# Patient Record
Sex: Male | Born: 1980
Health system: Southern US, Community
[De-identification: ages and names within clinical notes are randomized; demographics above are authoritative.]

## PROBLEM LIST (undated history)

## (undated) DIAGNOSIS — R7303 Prediabetes: Secondary | ICD-10-CM

## (undated) DIAGNOSIS — N200 Calculus of kidney: Secondary | ICD-10-CM

## (undated) DIAGNOSIS — F329 Major depressive disorder, single episode, unspecified: Secondary | ICD-10-CM

## (undated) DIAGNOSIS — E669 Obesity, unspecified: Secondary | ICD-10-CM

## (undated) DIAGNOSIS — G4700 Insomnia, unspecified: Secondary | ICD-10-CM

## (undated) DIAGNOSIS — F32A Depression, unspecified: Secondary | ICD-10-CM

## (undated) DIAGNOSIS — R42 Dizziness and giddiness: Secondary | ICD-10-CM

## (undated) DIAGNOSIS — E559 Vitamin D deficiency, unspecified: Secondary | ICD-10-CM

## (undated) DIAGNOSIS — F909 Attention-deficit hyperactivity disorder, unspecified type: Secondary | ICD-10-CM

## (undated) DIAGNOSIS — C189 Malignant neoplasm of colon, unspecified: Secondary | ICD-10-CM

## (undated) DIAGNOSIS — F419 Anxiety disorder, unspecified: Secondary | ICD-10-CM

## (undated) DIAGNOSIS — H7093 Unspecified mastoiditis, bilateral: Secondary | ICD-10-CM

## (undated) DIAGNOSIS — Z87442 Personal history of urinary calculi: Secondary | ICD-10-CM

## (undated) DIAGNOSIS — C801 Malignant (primary) neoplasm, unspecified: Secondary | ICD-10-CM

## (undated) DIAGNOSIS — U071 COVID-19: Secondary | ICD-10-CM

## (undated) HISTORY — DX: Obesity, unspecified: E66.9

## (undated) HISTORY — PX: NO PAST SURGERIES: SHX2092

## (undated) HISTORY — DX: COVID-19: U07.1

## (undated) HISTORY — DX: Prediabetes: R73.03

## (undated) HISTORY — DX: Insomnia, unspecified: G47.00

## (undated) HISTORY — DX: Vitamin D deficiency, unspecified: E55.9

## (undated) HISTORY — DX: Attention-deficit hyperactivity disorder, unspecified type: F90.9

## (undated) HISTORY — DX: Dizziness and giddiness: R42

## (undated) HISTORY — DX: Depression, unspecified: F32.A

## (undated) HISTORY — DX: Anxiety disorder, unspecified: F41.9

## (undated) HISTORY — DX: Unspecified mastoiditis, bilateral: H70.93

---
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
## 2007-11-06 ENCOUNTER — Emergency Department: Payer: Self-pay | Admitting: Emergency Medicine

## 2007-12-04 ENCOUNTER — Ambulatory Visit: Payer: Self-pay | Admitting: Family Medicine

## 2010-06-15 ENCOUNTER — Emergency Department: Payer: Self-pay | Admitting: Emergency Medicine

## 2010-08-02 ENCOUNTER — Emergency Department: Payer: Self-pay | Admitting: Emergency Medicine

## 2010-08-04 ENCOUNTER — Emergency Department: Payer: Self-pay | Admitting: Emergency Medicine

## 2018-07-22 DIAGNOSIS — F330 Major depressive disorder, recurrent, mild: Secondary | ICD-10-CM | POA: Diagnosis not present

## 2018-11-14 DIAGNOSIS — F332 Major depressive disorder, recurrent severe without psychotic features: Secondary | ICD-10-CM | POA: Diagnosis not present

## 2018-11-25 DIAGNOSIS — F332 Major depressive disorder, recurrent severe without psychotic features: Secondary | ICD-10-CM | POA: Diagnosis not present

## 2019-06-02 ENCOUNTER — Ambulatory Visit (INDEPENDENT_AMBULATORY_CARE_PROVIDER_SITE_OTHER): Payer: BC Managed Care – PPO | Admitting: Internal Medicine

## 2019-06-02 ENCOUNTER — Encounter: Payer: Self-pay | Admitting: Internal Medicine

## 2019-06-02 ENCOUNTER — Other Ambulatory Visit: Payer: Self-pay

## 2019-06-02 DIAGNOSIS — R7303 Prediabetes: Secondary | ICD-10-CM

## 2019-06-02 DIAGNOSIS — Z13220 Encounter for screening for lipoid disorders: Secondary | ICD-10-CM

## 2019-06-02 DIAGNOSIS — G4700 Insomnia, unspecified: Secondary | ICD-10-CM | POA: Insufficient documentation

## 2019-06-02 DIAGNOSIS — F329 Major depressive disorder, single episode, unspecified: Secondary | ICD-10-CM

## 2019-06-02 DIAGNOSIS — Z0000 Encounter for general adult medical examination without abnormal findings: Secondary | ICD-10-CM

## 2019-06-02 DIAGNOSIS — Z1329 Encounter for screening for other suspected endocrine disorder: Secondary | ICD-10-CM

## 2019-06-02 DIAGNOSIS — Z1389 Encounter for screening for other disorder: Secondary | ICD-10-CM

## 2019-06-02 DIAGNOSIS — E669 Obesity, unspecified: Secondary | ICD-10-CM | POA: Insufficient documentation

## 2019-06-02 DIAGNOSIS — F419 Anxiety disorder, unspecified: Secondary | ICD-10-CM

## 2019-06-02 DIAGNOSIS — E559 Vitamin D deficiency, unspecified: Secondary | ICD-10-CM

## 2019-06-02 DIAGNOSIS — F331 Major depressive disorder, recurrent, moderate: Secondary | ICD-10-CM | POA: Insufficient documentation

## 2019-06-02 DIAGNOSIS — F3341 Major depressive disorder, recurrent, in partial remission: Secondary | ICD-10-CM | POA: Insufficient documentation

## 2019-06-02 DIAGNOSIS — F32A Depression, unspecified: Secondary | ICD-10-CM

## 2019-06-02 MED ORDER — TRAZODONE HCL 50 MG PO TABS: 25.0000 mg | ORAL_TABLET | Freq: Every evening | ORAL | 2 refills | Status: DC | PRN

## 2019-06-02 NOTE — Patient Instructions (Signed)
Trazodone tablets What is this medicine? TRAZODONE (TRAZ oh done) is used to treat depression. This medicine may be used for other purposes; ask your health care provider or pharmacist if you have questions. COMMON BRAND NAME(S): Desyrel What should I tell my health care provider before I take this medicine? They need to know if you have any of these conditions: -attempted suicide or thinking about it -bipolar disorder -bleeding problems -glaucoma -heart disease, or previous heart attack -irregular heart beat -kidney or liver disease -low levels of sodium in the blood -an unusual or allergic reaction to trazodone, other medicines, foods, dyes or preservatives -pregnant or trying to get pregnant -breast-feeding How should I use this medicine? Take this medicine by mouth with a glass of water. Follow the directions on the prescription label. Take this medicine shortly after a meal or a light snack. Take your medicine at regular intervals. Do not take your medicine more often than directed. Do not stop taking this medicine suddenly except upon the advice of your doctor. Stopping this medicine too quickly may cause serious side effects or your condition may worsen. A special MedGuide will be given to you by the pharmacist with each prescription and refill. Be sure to read this information carefully each time. Talk to your pediatrician regarding the use of this medicine in children. Special care may be needed. Overdosage: If you think you have taken too much of this medicine contact a poison control center or emergency room at once. NOTE: This medicine is only for you. Do not share this medicine with others. What if I miss a dose? If you miss a dose, take it as soon as you can. If it is almost time for your next dose, take only that dose. Do not take double or extra doses. What may interact with this medicine? Do not take this medicine with any of the following medications: -certain medicines  for fungal infections like fluconazole, itraconazole, ketoconazole, posaconazole, voriconazole -cisapride -dofetilide -dronedarone -linezolid -MAOIs like Carbex, Eldepryl, Marplan, Nardil, and Parnate -mesoridazine -methylene blue (injected into a vein) -pimozide -saquinavir -thioridazine This medicine may also interact with the following medications: -alcohol -antiviral medicines for HIV or AIDS -aspirin and aspirin-like medicines -barbiturates like phenobarbital -certain medicines for blood pressure, heart disease, irregular heart beat -certain medicines for depression, anxiety, or psychotic disturbances -certain medicines for migraine headache like almotriptan, eletriptan, frovatriptan, naratriptan, rizatriptan, sumatriptan, zolmitriptan -certain medicines for seizures like carbamazepine and phenytoin -certain medicines for sleep -certain medicines that treat or prevent blood clots like dalteparin, enoxaparin, warfarin -digoxin -fentanyl -lithium -NSAIDS, medicines for pain and inflammation, like ibuprofen or naproxen -other medicines that prolong the QT interval (cause an abnormal heart rhythm) -rasagiline -supplements like St. John's wort, kava kava, valerian -tramadol -tryptophan This list may not describe all possible interactions. Give your health care provider a list of all the medicines, herbs, non-prescription drugs, or dietary supplements you use. Also tell them if you smoke, drink alcohol, or use illegal drugs. Some items may interact with your medicine. What should I watch for while using this medicine? Tell your doctor if your symptoms do not get better or if they get worse. Visit your doctor or health care professional for regular checks on your progress. Because it may take several weeks to see the full effects of this medicine, it is important to continue your treatment as prescribed by your doctor. Patients and their families should watch out for new or worsening  thoughts of suicide or depression. Also watch  out for sudden changes in feelings such as feeling anxious, agitated, panicky, irritable, hostile, aggressive, impulsive, severely restless, overly excited and hyperactive, or not being able to sleep. If this happens, especially at the beginning of treatment or after a change in dose, call your health care professional. Cory QuinYou may get drowsy or dizzy. Do not drive, use machinery, or do anything that needs mental alertness until you know how this medicine affects you. Do not stand or sit up quickly, especially if you are an older patient. This reduces the risk of dizzy or fainting spells. Alcohol may interfere with the effect of this medicine. Avoid alcoholic drinks. This medicine may cause dry eyes and blurred vision. If you wear contact lenses you may feel some discomfort. Lubricating drops may help. See your eye doctor if the problem does not go away or is severe. Your mouth may get dry. Chewing sugarless gum, sucking hard candy and drinking plenty of water may help. Contact your doctor if the problem does not go away or is severe. What side effects may I notice from receiving this medicine? Side effects that you should report to your doctor or health care professional as soon as possible: -allergic reactions like skin rash, itching or hives, swelling of the face, lips, or tongue -elevated mood, decreased need for sleep, racing thoughts, impulsive behavior -confusion -fast, irregular heartbeat -feeling faint or lightheaded, falls -feeling agitated, angry, or irritable -loss of balance or coordination -painful or prolonged erections -restlessness, pacing, inability to keep still -suicidal thoughts or other mood changes -tremors -trouble sleeping -seizures -unusual bleeding or bruising Side effects that usually do not require medical attention (report to your doctor or health care professional if they continue or are bothersome): -change in sex drive or  performance -change in appetite or weight -constipation -headache -muscle aches or pains -nausea This list may not describe all possible side effects. Call your doctor for medical advice about side effects. You may report side effects to FDA at 1-800-FDA-1088. Where should I keep my medicine? Keep out of the reach of children. Store at room temperature between 15 and 30 degrees C (59 to 86 degrees F). Protect from light. Keep container tightly closed. Throw away any unused medicine after the expiration date. NOTE: This sheet is a summary. It may not cover all possible information. If you have questions about this medicine, talk to your doctor, pharmacist, or health care provider.  2019 Elsevier/Gold Standard (2018-02-18 17:51:24)  Insomnia Insomnia is a sleep disorder that makes it difficult to fall asleep or stay asleep. Insomnia can cause fatigue, low energy, difficulty concentrating, mood swings, and poor performance at work or school. There are three different ways to classify insomnia:  Difficulty falling asleep.  Difficulty staying asleep.  Waking up too early in the morning. Any type of insomnia can be long-term (chronic) or short-term (acute). Both are common. Short-term insomnia usually lasts for three months or less. Chronic insomnia occurs at least three times a week for longer than three months. What are the causes? Insomnia may be caused by another condition, situation, or substance, such as:  Anxiety.  Certain medicines.  Gastroesophageal reflux disease (GERD) or other gastrointestinal conditions.  Asthma or other breathing conditions.  Restless legs syndrome, sleep apnea, or other sleep disorders.  Chronic pain.  Menopause.  Stroke.  Abuse of alcohol, tobacco, or illegal drugs.  Mental health conditions, such as depression.  Caffeine.  Neurological disorders, such as Alzheimer's disease.  An overactive thyroid (hyperthyroidism). Sometimes, the cause  of insomnia may not be known. What increases the risk? Risk factors for insomnia include:  Gender. Women are affected more often than men.  Age. Insomnia is more common as you get older.  Stress.  Lack of exercise.  Irregular work schedule or working night shifts.  Traveling between different time zones.  Certain medical and mental health conditions. What are the signs or symptoms? If you have insomnia, the main symptom is having trouble falling asleep or having trouble staying asleep. This may lead to other symptoms, such as:  Feeling fatigued or having low energy.  Feeling nervous about going to sleep.  Not feeling rested in the morning.  Having trouble concentrating.  Feeling irritable, anxious, or depressed. How is this diagnosed? This condition may be diagnosed based on:  Your symptoms and medical history. Your health care provider may ask about: ? Your sleep habits. ? Any medical conditions you have. ? Your mental health.  A physical exam. How is this treated? Treatment for insomnia depends on the cause. Treatment may focus on treating an underlying condition that is causing insomnia. Treatment may also include:  Medicines to help you sleep.  Counseling or therapy.  Lifestyle adjustments to help you sleep better. Follow these instructions at home: Eating and drinking   Limit or avoid alcohol, caffeinated beverages, and cigarettes, especially close to bedtime. These can disrupt your sleep.  Do not eat a large meal or eat spicy foods right before bedtime. This can lead to digestive discomfort that can make it hard for you to sleep. Sleep habits   Keep a sleep diary to help you and your health care provider figure out what could be causing your insomnia. Write down: ? When you sleep. ? When you wake up during the night. ? How well you sleep. ? How rested you feel the next day. ? Any side effects of medicines you are taking. ? What you eat and  drink.  Make your bedroom a dark, comfortable place where it is easy to fall asleep. ? Put up shades or blackout curtains to block light from outside. ? Use a white noise machine to block noise. ? Keep the temperature cool.  Limit screen use before bedtime. This includes: ? Watching TV. ? Using your smartphone, tablet, or computer.  Stick to a routine that includes going to bed and waking up at the same times every day and night. This can help you fall asleep faster. Consider making a quiet activity, such as reading, part of your nighttime routine.  Try to avoid taking naps during the day so that you sleep better at night.  Get out of bed if you are still awake after 15 minutes of trying to sleep. Keep the lights down, but try reading or doing a quiet activity. When you feel sleepy, go back to bed. General instructions  Take over-the-counter and prescription medicines only as told by your health care provider.  Exercise regularly, as told by your health care provider. Avoid exercise starting several hours before bedtime.  Use relaxation techniques to manage stress. Ask your health care provider to suggest some techniques that may work well for you. These may include: ? Breathing exercises. ? Routines to release muscle tension. ? Visualizing peaceful scenes.  Make sure that you drive carefully. Avoid driving if you feel very sleepy.  Keep all follow-up visits as told by your health care provider. This is important. Contact a health care provider if:  You are tired throughout the day.  You have trouble in your daily routine due to sleepiness.  You continue to have sleep problems, or your sleep problems get worse. Get help right away if:  You have serious thoughts about hurting yourself or someone else. If you ever feel like you may hurt yourself or others, or have thoughts about taking your own life, get help right away. You can go to your nearest emergency department or  call:  Your local emergency services (911 in the [place]  A suicide crisis helpline, such as the [facility] at 657-137-0810. This is open 24 hours a day. Summary  Insomnia is a sleep disorder that makes it difficult to fall asleep or stay asleep.  Insomnia can be long-term (chronic) or short-term (acute).  Treatment for insomnia depends on the cause. Treatment may focus on treating an underlying condition that is causing insomnia.  Keep a sleep diary to help you and your health care provider figure out what could be causing your insomnia. This information is not intended to replace advice given to you by your health care provider. Make sure you discuss any questions you have with your health care provider. Document Released: 12/07/2000 Document Revised: 09/19/2017 Document Reviewed: 09/19/2017 Elsevier Interactive Patient Education  2019 Else[employer].  Persistent Depressive Disorder, Adult Persistent depressive disorder (PDD) is a mental health condition that causes symptoms of low-level depression for 2 years or longer. It may also be called long-term (chronic) depression or dysthymia. PDD may include episodes of more severe depression that last for about 2 weeks (major depressive disorder or MDD). PDD can affect the way you think, feel, and sleep. This condition may also affect your relationships. You may be more likely to get sick if you have PDD. What are the causes? The exact cause of this condition is not known. PDD is most likely caused by a combination of things, which may include:  Genetic factors. These are traits that are passed along from parent to child.  Individual factors. Your personality, your behavior, and the way you handle your thoughts and feelings may contribute to PDD. This includes personality traits and behaviors learned from others.  Physical factors, such as: ? Differences in the part of your brain that controls emotion. This part of  your brain may be different than it is in people who do not have PDD. ? Long-term (chronic) medical or psychiatric illnesses.  Social factors. Traumatic experiences or major life changes may play a role in the development of PDD. What increases the risk? This condition is more likely to develop in women. The following factors may make you more likely to develop PDD:  A family history of depression.  Abnormally low levels of certain brain chemicals.  Traumatic events in childhood, especially abuse or the loss of a parent.  Being under a lot of stress, or long-term stress, especially from upsetting life experiences or losses.  A history of: ? Chronic physical illness. ? Other mental health disorders. ? Substance abuse.  Poor living conditions.  Experiencing social exclusion or discrimination on a regular basis. What are the signs or symptoms? Symptoms of this condition occur for most of the day, and may include:  Fatigue or low energy.  Eating too much or too little.  Sleeping too much or too little.  Restlessness or agitation.  Feelings of hopelessness.  Feeling worthless or guilty.  Anxiety.  Poor concentration or difficulty making decisions.  Low self-esteem.  Negative outlook.  Inability to have fun or experience pleasure.  Social withdrawal.  Unexplained physical complaints.  Irritability.  Aggressive behavior or anger. How is this diagnosed? This condition may be diagnosed based on:  Your symptoms.  Your medical history, including your mental health history. This may involve tests to evaluate your mental health. You may be asked questions about your lifestyle, including any drug and alcohol use, and how long you have had symptoms of PDD.  A physical exam.  Blood tests to rule out other conditions. You may be diagnosed with PDD if you have had a depressed mood for 2 years or longer, as well as other symptoms of depression. How is this  treated? This condition is usually treated by mental health professionals, such as psychologists, psychiatrists, and clinical social workers. You may need more than one type of treatment. Treatment may include:  Psychotherapy. This is also called talk therapy or counseling. Types of psychotherapy include: ? Cognitive behavioral therapy (CBT). This type of therapy teaches you to recognize unhealthy feelings, thoughts, and behaviors, and replace them with positive thoughts and actions. ? Interpersonal therapy (IPT). This helps you to improve the way you relate to and communicate with others. ? Family therapy. This treatment includes members of your family.  Medicine to treat anxiety and depression, or to help you control certain emotions and behaviors.  Lifestyle changes, such as: ? Limiting alcohol and drug use. ? Exercising regularly. ? Getting plenty of sleep. ? Making healthy eating choices. ? Spending more time outdoors.  Follow these instructions at home: Activity  Return to your normal activities as told by your health care provider.  Exercise regularly and spend time outdoors as told by your health care provider. General instructions  Take over-the-counter and prescription medicines only as told by your health care provider.  Do not drink alcohol. If you drink alcohol, limit your alcohol intake to no more than 1 drink a day for nonpregnant women and 2 drinks a day for men. One drink equals 12 oz of beer, 5 oz of wine, or 1 oz of hard liquor. Alcohol can affect any antidepressant medicines you are taking. Talk to your health care provider about your alcohol use.  Eat a healthy diet and get plenty of sleep.  Find activities that you enjoy doing, and make time to do them.  Consider joining a support group. Your health care provider may be able to recommend a support group.  Keep all follow-up visits as told by your health care provider. This is important. Where to find more  information [employer] on Mental Illness  www.nami.org [place] [employer] of Mental Health  http://www.maynard.net/ National Suicide Prevention Lifeline  1-800-273-TALK (949)091-3459). This is free, 24-hour help. Contact a health care provider if:  Your symptoms get worse.  You develop new symptoms.  You have trouble sleeping or doing your daily activities. Get help right away if:  You self-harm.  You have serious thoughts about hurting yourself or others.  You see, hear, taste, smell, or feel things that are not present (hallucinate). This information is not intended to replace advice given to you by your health care provider. Make sure you discuss any questions you have with your health care provider. Document Released: 11/26/2012 Document Revised: 08/09/2016 Document Reviewed: 06/23/2016 Elsevier Interactive Patient Education  2019 Elsevier Inc.  Generalized Anxiety Disorder, Adult Generalized anxiety disorder (GAD) is a mental health disorder. People with this condition constantly worry about everyday events. Unlike normal anxiety, worry related to GAD is not triggered by a specific event. These  worries also do not fade or get better with time. GAD interferes with life functions, including relationships, work, and school. GAD can vary from mild to severe. People with severe GAD can have intense waves of anxiety with physical symptoms (panic attacks). What are the causes? The exact cause of GAD is not known. What increases the risk? This condition is more likely to develop in:  Women.  People who have a family history of anxiety disorders.  People who are very shy.  People who experience very stressful life events, such as the death of a loved one.  People who have a very stressful family environment. What are the signs or symptoms? People with GAD often worry excessively about many things in their lives, such as their health and family. They may also be overly  concerned about:  Doing well at work.  Being on time.  Natural disasters.  Friendships. Physical symptoms of GAD include:  Fatigue.  Muscle tension or having muscle twitches.  Trembling or feeling shaky.  Being easily startled.  Feeling like your heart is pounding or racing.  Feeling out of breath or like you cannot take a deep breath.  Having trouble falling asleep or staying asleep.  Sweating.  Nausea, diarrhea, or irritable bowel syndrome (IBS).  Headaches.  Trouble concentrating or remembering facts.  Restlessness.  Irritability. How is this diagnosed? Your health care provider can diagnose GAD based on your symptoms and medical history. You will also have a physical exam. The health care provider will ask specific questions about your symptoms, including how severe they are, when they started, and if they come and go. Your health care provider may ask you about your use of alcohol or drugs, including prescription medicines. Your health care provider may refer you to a mental health specialist for further evaluation. Your health care provider will do a thorough examination and may perform additional tests to rule out other possible causes of your symptoms. To be diagnosed with GAD, a person must have anxiety that:  Is out of his or her control.  Affects several different aspects of his or her life, such as work and relationships.  Causes distress that makes him or her unable to take part in normal activities.  Includes at least three physical symptoms of GAD, such as restlessness, fatigue, trouble concentrating, irritability, muscle tension, or sleep problems. Before your health care provider can confirm a diagnosis of GAD, these symptoms must be present more days than they are not, and they must last for six months or longer. How is this treated? The following therapies are usually used to treat GAD:  Medicine. Antidepressant medicine is usually prescribed for  long-term daily control. Antianxiety medicines may be added in severe cases, especially when panic attacks occur.  Talk therapy (psychotherapy). Certain types of talk therapy can be helpful in treating GAD by providing support, education, and guidance. Options include: ? Cognitive behavioral therapy (CBT). People learn coping skills and techniques to ease their anxiety. They learn to identify unrealistic or negative thoughts and behaviors and to replace them with positive ones. ? Acceptance and commitment therapy (ACT). This treatment teaches people how to be mindful as a way to cope with unwanted thoughts and feelings. ? Biofeedback. This process trains you to manage your body's response (physiological response) through breathing techniques and relaxation methods. You will work with a therapist while machines are used to monitor your physical symptoms.  Stress management techniques. These include yoga, meditation, and exercise. A mental health  specialist can help determine which treatment is best for you. Some people see improvement with one type of therapy. However, other people require a combination of therapies. Follow these instructions at home:  Take over-the-counter and prescription medicines only as told by your health care provider.  Try to maintain a normal routine.  Try to anticipate stressful situations and allow extra time to manage them.  Practice any stress management or self-calming techniques as taught by your health care provider.  Do not punish yourself for setbacks or for not making progress.  Try to recognize your accomplishments, even if they are small.  Keep all follow-up visits as told by your health care provider. This is important. Contact a health care provider if:  Your symptoms do not get better.  Your symptoms get worse.  You have signs of depression, such as: ? A persistently sad, cranky, or irritable mood. ? Loss of enjoyment in activities that used to  bring you joy. ? Change in weight or eating. ? Changes in sleeping habits. ? Avoiding friends or family members. ? Loss of energy for normal tasks. ? Feelings of guilt or worthlessness. Get help right away if:  You have serious thoughts about hurting yourself or others. If you ever feel like you may hurt yourself or others, or have thoughts about taking your own life, get help right away. You can go to your nearest emergency department or call:  Your local emergency services (911 in the [place]  A suicide crisis helpline, such as the National Suicide Prevention Lifeline at 77571776091-(819) 313-4949. This is open 24 hours a day. Summary  Generalized anxiety disorder (GAD) is a mental health disorder that involves worry that is not triggered by a specific event.  People with GAD often worry excessively about many things in their lives, such as their health and family.  GAD may cause physical symptoms such as restlessness, trouble concentrating, sleep problems, frequent sweating, nausea, diarrhea, headaches, and trembling or muscle twitching.  A mental health specialist can help determine which treatment is best for you. Some people see improvement with one type of therapy. However, other people require a combination of therapies. This information is not intended to replace advice given to you by your health care provider. Make sure you discuss any questions you have with your health care provider. Document Released: 04/06/2013 Document Revised: 10/30/2016 Document Reviewed: 10/30/2016 Elsevier Interactive Patient Education  2019 [employer]
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
## 2019-06-02 NOTE — Progress Notes (Signed)
Pre visit review using our clinic review tool, if applicable. No additional management support is needed unless otherwise documented below in the visit note. 

## 2019-06-11 ENCOUNTER — Other Ambulatory Visit (INDEPENDENT_AMBULATORY_CARE_PROVIDER_SITE_OTHER): Payer: BC Managed Care – PPO

## 2019-06-11 ENCOUNTER — Encounter: Payer: Self-pay | Admitting: Internal Medicine

## 2019-06-11 ENCOUNTER — Other Ambulatory Visit: Payer: Self-pay

## 2019-06-11 DIAGNOSIS — R748 Abnormal levels of other serum enzymes: Secondary | ICD-10-CM | POA: Insufficient documentation

## 2019-06-11 DIAGNOSIS — E559 Vitamin D deficiency, unspecified: Secondary | ICD-10-CM | POA: Diagnosis not present

## 2019-06-11 DIAGNOSIS — Z13220 Encounter for screening for lipoid disorders: Secondary | ICD-10-CM | POA: Diagnosis not present

## 2019-06-11 DIAGNOSIS — F329 Major depressive disorder, single episode, unspecified: Secondary | ICD-10-CM | POA: Diagnosis not present

## 2019-06-11 DIAGNOSIS — F419 Anxiety disorder, unspecified: Secondary | ICD-10-CM

## 2019-06-11 DIAGNOSIS — R7303 Prediabetes: Secondary | ICD-10-CM | POA: Diagnosis not present

## 2019-06-11 DIAGNOSIS — Z1329 Encounter for screening for other suspected endocrine disorder: Secondary | ICD-10-CM

## 2019-06-11 DIAGNOSIS — Z1389 Encounter for screening for other disorder: Secondary | ICD-10-CM

## 2019-06-11 DIAGNOSIS — Z0000 Encounter for general adult medical examination without abnormal findings: Secondary | ICD-10-CM | POA: Diagnosis not present

## 2019-06-11 HISTORY — DX: Abnormal levels of other serum enzymes: R74.8

## 2019-06-11 LAB — VITAMIN D 25 HYDROXY (VIT D DEFICIENCY, FRACTURES): VITD: 27.48 ng/mL — ABNORMAL LOW (ref 30.00–100.00)

## 2019-06-11 LAB — CBC WITH DIFFERENTIAL/PLATELET
Basophils Absolute: 0.1 10*3/uL (ref 0.0–0.1)
Basophils Relative: 1.1 % (ref 0.0–3.0)
Eosinophils Absolute: 0.1 10*3/uL (ref 0.0–0.7)
Eosinophils Relative: 1.8 % (ref 0.0–5.0)
HCT: 42.4 % (ref 39.0–52.0)
Hemoglobin: 14.5 g/dL (ref 13.0–17.0)
Lymphocytes Relative: 33.7 % (ref 12.0–46.0)
Lymphs Abs: 2 10*3/uL (ref 0.7–4.0)
MCHC: 34.2 g/dL (ref 30.0–36.0)
MCV: 89.9 fl (ref 78.0–100.0)
Monocytes Absolute: 0.5 10*3/uL (ref 0.1–1.0)
Monocytes Relative: 8.2 % (ref 3.0–12.0)
Neutro Abs: 3.2 10*3/uL (ref 1.4–7.7)
Neutrophils Relative %: 55.2 % (ref 43.0–77.0)
Platelets: 286 10*3/uL (ref 150.0–400.0)
RBC: 4.71 Mil/uL (ref 4.22–5.81)
RDW: 13 % (ref 11.5–15.5)
WBC: 5.8 10*3/uL (ref 4.0–10.5)

## 2019-06-11 LAB — COMPREHENSIVE METABOLIC PANEL
ALT: 57 U/L — ABNORMAL HIGH (ref 0–53)
AST: 42 U/L — ABNORMAL HIGH (ref 0–37)
Albumin: 4.5 g/dL (ref 3.5–5.2)
Alkaline Phosphatase: 54 U/L (ref 39–117)
BUN: 13 mg/dL (ref 6–23)
CO2: 25 mEq/L (ref 19–32)
Calcium: 9.5 mg/dL (ref 8.4–10.5)
Chloride: 102 mEq/L (ref 96–112)
Creatinine, Ser: 0.76 mg/dL (ref 0.40–1.50)
GFR: 116.28 mL/min (ref >60.00)
Glucose, Bld: 90 mg/dL (ref 70–99)
Potassium: 4.2 mEq/L (ref 3.5–5.1)
Sodium: 136 mEq/L (ref 135–145)
Total Bilirubin: 0.5 mg/dL (ref 0.2–1.2)
Total Protein: 7.2 g/dL (ref 6.0–8.3)

## 2019-06-11 LAB — LIPID PANEL
Cholesterol: 175 mg/dL (ref 0–200)
HDL: 33.2 mg/dL — ABNORMAL LOW (ref >39.00)
LDL Cholesterol: 102 mg/dL — ABNORMAL HIGH (ref 0–99)
NonHDL: 141.54
Total CHOL/HDL Ratio: 5
Triglycerides: 200 mg/dL — ABNORMAL HIGH (ref 0.0–149.0)
VLDL: 40 mg/dL (ref 0.0–40.0)

## 2019-06-11 LAB — HEMOGLOBIN A1C: Hgb A1c MFr Bld: 6.4 % (ref 4.6–6.5)

## 2019-06-11 LAB — TSH: TSH: 3.38 u[IU]/mL (ref 0.35–4.50)

## 2019-06-12 ENCOUNTER — Other Ambulatory Visit: Payer: Self-pay | Admitting: Internal Medicine

## 2019-06-12 DIAGNOSIS — R748 Abnormal levels of other serum enzymes: Secondary | ICD-10-CM

## 2019-06-12 LAB — URINALYSIS, ROUTINE W REFLEX MICROSCOPIC
Bilirubin Urine: NEGATIVE
Glucose, UA: NEGATIVE
Hgb urine dipstick: NEGATIVE
Ketones, ur: NEGATIVE
Leukocytes,Ua: NEGATIVE
Nitrite: NEGATIVE
Protein, ur: NEGATIVE
Specific Gravity, Urine: 1.021 (ref 1.001–1.03)
pH: 5.5 (ref 5.0–8.0)

## 2019-06-16 ENCOUNTER — Ambulatory Visit (INDEPENDENT_AMBULATORY_CARE_PROVIDER_SITE_OTHER): Payer: BC Managed Care – PPO | Admitting: Licensed Clinical Social Worker

## 2019-06-16 DIAGNOSIS — F321 Major depressive disorder, single episode, moderate: Secondary | ICD-10-CM | POA: Diagnosis not present

## 2019-06-16 NOTE — Progress Notes (Signed)
Comprehensive Clinical Assessment (CCA) Note  06/16/2019 Janee Mornay Cooper II Fetty [id number]   Virtual Visit via Video Note  I connected with Anup Maximino Sarinooper II Bukowski on 06/16/19 at  4:00 PM EDT by a video enabled telemedicine application and verified that I am speaking with the correct person using two identifiers.   I discussed the limitations of evaluation and management by telemedicine and the availability of in person appointments. The patient expressed understanding and agreed to proceed.  I discussed the assessment and treatment plan with the patient. The patient was provided an opportunity to ask questions and all were answered. The patient agreed with the plan and demonstrated an understanding of the instructions.   The patient was advised to call back or seek an in-person evaluation if the symptoms worsen or if the condition fails to improve as anticipated.    Visit Diagnosis:      ICD-10-CM   1. Major depressive disorder, single episode, moderate (HCC)  F32.1       CCA Part One  Part One has been completed on paper by the patient.  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 He reports that he does a lot for her and  around the house, but she does not seem to appreciate it. Patient states that he has tried to talk to her, but that she denies there is a problem and will not sit down to discuss the relationship or see a marriage counselor. He reports that the stress from this is impacting him in many areas of his life. In addition, patient reports anxiety and worry that someone is going to come up behind him and attack him. He denies any history of trauma, but states that his parents argued a lot when he was young. He has no memory of there ever being any violence in the home.   Mental Health Symptoms Depression:  Depression: Change in energy/activity, Difficulty Concentrating, Fatigue, Hopelessness, Weight gain/loss, Increase/decrease in appetite, Worthlessness, Irritability, Sleep (too much or little), Tearfulness  Mania:  Mania: N/A  Anxiety:   Anxiety: Worrying, Difficulty concentrating, Irritability, Fatigue, Restlessness(sits with back to door, fears people coming up behind him to harm him and he can't defend himself)  Psychosis:  Psychosis: N/A  Trauma:  Trauma: N/A  Obsessions:  Obsessions: N/A  Compulsions:  Compulsions: N/A  Inattention:  Inattention: Disorganized, Poor follow-through on tasks, Loses things  Hyperactivity/Impulsivity:  Hyperactivity/Impulsivity: N/A  Oppositional/Defiant Behaviors:  Oppositional/Defiant Behaviors: N/A  Borderline Personality:  Emotional Irregularity: N/A  Other Mood/Personality Symptoms:      Mental Status Exam Appearance and self-care  Stature:  Stature: Average  Weight:  Weight: Overweight  Clothing:  Clothing: Casual  Grooming:  Grooming: Well-groomed  Cosmetic use:  Cosmetic Use: None  Posture/gait:  Posture/Gait: Normal  Motor activity:  Motor Activity: Not Remarkable  Sensorium  Attention:  Attention: Normal  Concentration:  Concentration: Anxiety interferes  Orientation:  Orientation: X5  Recall/memory:  Recall/Memory: Normal  Affect and Mood   Affect:  Affect: Blunted  Mood:  Mood: Depressed  Relating  Eye contact:  Eye Contact: Normal  Facial expression:  Facial Expression: Constricted  Attitude toward examiner:  Attitude Toward Examiner: Cooperative  Thought and Language  Speech flow: Normal  Thought content:  Thought Content: Appropriate to mood and circumstances  Preoccupation:  n/a  Hallucinations:  n/a  Organization:  n/a  Transport planner of Knowledge:  Fund of Knowledge: Average(disorganized, difficulty folllowing through)  Intelligence:  Intelligence: Average  Abstraction:  Abstraction: Normal  Judgement:  Judgement: Common-sensical  Reality Testing:  Reality Testing: Realistic  Insight:  Insight: Fair  Decision Making:  Decision Making: Normal  Social Functioning  Social Maturity:  Social Maturity: Isolates  Social Judgement:  Social Judgement: Normal  Stress  Stressors:  Stressors: Family conflict  Coping Ability:  Coping Ability: English as a second language teacher Deficits:  Communication and Socialization  Supports:  Mom and brother   Family and Psychosocial History: Family history Marital status: Married Number of Years Married: 1 What types of issues is patient dealing with in the relationship?: doesn't feel wife appreciates what he does for her, takes advantage of him, believes he rushed into the [place] after 6 months of dating because wanted to have someone there to love him, wife says there are no problems Additional relationship information: married before, divorced and remarried a year; wife has had a lot of medical problems, he keeps up around the house and she doesn't, then she gets upset if he asks for help or if he doesn't want to do anything, she refuses to sit down and talk about it or go to marriage counseling Are you sexually active?: Yes What is your sexual orientation?: straight Does patient have children?: No  Childhood History:  Childhood History By whom was/is the patient raised?:  Mother/father and step-parent(mother remarried, still married to stepfather who treats him like a son; dad saw them on weekends, holidays and for extended times in the summer) Additional childhood history information: parents divorced when he was in 2nd grade, he felt like it was his fault misbehaviing or being too much trouble, difficult custody battle, dad had a drinking problem Description of patient's relationship with caregiver when they were a child: good with both and with stepfather Patient's description of current relationship with people who raised him/her: father is deceased, had an argument before he died and didn't get to make things right. How were you disciplined when you got in trouble as a child/adolescent?: grounded, spanked but only once in his life Does patient have siblings?: Yes Number of Siblings: 2 Description of patient's current relationship with siblings: very good with middle brother (lives nearby), not as close to the younger brother (in [place]) Did patient suffer any verbal/emotional/physical/sexual abuse as a child?: No Did patient suffer from severe childhood neglect?: No Has patient ever been sexually abused/assaulted/raped as an adolescent or adult?: No Was the patient ever a victim of a crime or a disaster?: No Witnessed domestic violence?: No Has patient been effected by domestic violence as an adult?: No  CCA Part Two B  Employment/Work Situation: Employment / Work Copywriter, advertising Employment situation: Employed 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?: No Are There Guns or  Other Weapons in Your Home?: No  Education: Education Last Grade Completed: 12 Did Garment/textile technologistYou Graduate From [employer]High School?: Yes Did Theme park managerYou Attend College?: Yes What Type of College Degree Do you Have?: none -tech school but did not finish Did You Attend Graduate School?: No Did You Have An Individualized Education Program (IIEP): Yes Did You Have Any Difficulty At School?: Yes Were Any Medications Ever Prescribed For These Difficulties?: Yes Medications Prescribed For School Difficulties?: unsure - for ADHD  Religion: Religion/Spirituality Are You A Religious Person?: Yes What is Your Religious Affiliation?: Methodist How Might This Affect Treatment?: no longer going to church b/c disagrees with pastor's take on things  Leisure/Recreation: Leisure / Recreation Leisure and Hobbies: video games, walks at the park, [employer] bead art  Exercise/Diet: Exercise/Diet Do You Exercise?: No Have You Gained or Lost A Significant Amount of Weight in the Past Six Months?: No Do You Follow a Special Diet?: No Do You Have Any Trouble Sleeping?: Yes Explanation of Sleeping Difficulties: hard to go to sleep, brain is "too busy" to fall asleep  CCA Part Two C  Alcohol/Drug Use: Alcohol / Drug Use History of alcohol / drug use?: No history of alcohol / drug abuse  CCA Part Three  ASAM's:  Six Dimensions of Multidimensional Assessment  Dimension 1:  Acute Intoxication and/or Withdrawal Potential:     Dimension 2:  Biomedical Conditions and Complications:     Dimension 3:  Emotional, Behavioral, or Cognitive Conditions and Complications:  Dimension 3:  Comments: depression and anxiety  Dimension 4:  Readiness to Change:     Dimension 5:  Relapse, Continued use, or Continued Problem Potential:     Dimension 6:  Recovery/Living Environment:  Dimension 6:  Recovery/Living Environment Comments: stress in the home   Substance use Disorder (SUD)  Social Function:  Social Functioning Social Maturity:  Isolates Social Judgement: Normal  Stress:  Stress Stressors: Family conflict Coping Ability: Overwhelmed Patient Takes Medications The Way The Doctor Instructed?: Yes Priority Risk: Low Acuity  Risk Assessment- Self-Harm Potential: Risk Assessment For Self-Harm Potential Thoughts of Self-Harm: Vague current thoughts(recent to current passive thoughts that others would be better without him) Method: No plan Availability of Means: No access/NA  Risk Assessment -Dangerous to Others Potential: Risk Assessment For Dangerous to Others Potential Method: No Plan Availability of Means: No access or NA Intent: Vague intent or NA Notification Required: No need or identified person  DSM5 Diagnoses: Patient Active Problem List   Diagnosis Date Noted  . Vitamin D deficiency 06/11/2019  . Elevated liver enzymes 06/11/2019  . Prediabetes   . Obesity (BMI 30-39.9)   . Insomnia   . Anxiety and depression     Patient Centered Plan: Patient is on the following Treatment Plan(s):  Depression  Recommendations for Services/Supports/Treatments: Recommendations for Services/Supports/Treatments Recommendations For Services/Supports/Treatments: Individual Therapy, Medication Management  Treatment Plan Summary: Elevate mood and show evidence of usual energy, activity and socialization levels   I provided 54 minutes of non-face-to-face time during this encounter.   Angus Palmsegina Maygen Sirico, LCSW

## 2019-06-19 ENCOUNTER — Ambulatory Visit: Payer: BC Managed Care – PPO

## 2019-07-22 ENCOUNTER — Other Ambulatory Visit: Payer: Self-pay

## 2019-07-22 ENCOUNTER — Ambulatory Visit (INDEPENDENT_AMBULATORY_CARE_PROVIDER_SITE_OTHER): Payer: BC Managed Care – PPO | Admitting: Licensed Clinical Social Worker

## 2019-07-22 DIAGNOSIS — F321 Major depressive disorder, single episode, moderate: Secondary | ICD-10-CM

## 2019-07-22 NOTE — Progress Notes (Signed)
Patient ID: Cory Gomez, male   DOB: 10-22-1981, 38 y.o.   MRN: [id number]  Virtual Visit via Video Note  I connected with Cory Gomez on 07/22/19 at  4:00 PM EDT by a video enabled telemedicine application and verified that I am speaking with the correct person using two identifiers.     I discussed the limitations of evaluation and management by telemedicine and the availability of in person appointments. The patient expressed understanding and agreed to proceed.  I discussed the assessment and treatment plan with the patient. The patient was provided an opportunity to ask questions and all were answered. The patient agreed with the plan and demonstrated an understanding of the instructions.   The patient was advised to call back or seek an in-person evaluation if the symptoms worsen or if the condition fails to improve as anticipated.  Type of Therapy: Individual Therapy  Treatment Goals addressed:  Elevate mood to show evidence of usual levels of energy, activity and socialization.   Interventions: Reality Therapy, Supportive Counseling  Summary: Cory Gomez is a 38 y.o. male who presents with symptoms of low motivation and energy, irritability, difficulty concentrating.  Therapist Response:  Patient met with clinician for an individual session. Patient reports that wife has been communicating with him more, but she does not seem open to his suggestions or concerns. He states that his work is being impacted as he feels pulled in many different directions and cannot focus on his job. Counselor guided patient to process situations when he has tried to communicate his needs to wife. Patient reports that wife often says that she is sick or unable to help out, and that she gets upset and says the stress is making things worse when he does try to express himself. Counselor challenged patient to identify what "better" would look like from him. Patient indicates that he would have some  help around the house, maybe wife cook dinner with him, and they enjoy their time together. He states that at this time he dreads going home at the end of the day. Counselor explored with patient steps toward this vision, and choices patient could make to support those steps. Elevate mood to show evidence of usual levels of energy, activity and socialization.  Suicidal/Homicidal:  No   Recommendation and plan:  Continued sessions every other week  Diagnosis:  f33.1  I provided 53 minutes of non-face-to-face time during this encounter.   Lillie Fragmin, LCSW

## 2019-07-24 ENCOUNTER — Other Ambulatory Visit: Payer: Self-pay

## 2019-07-24 ENCOUNTER — Encounter (HOSPITAL_COMMUNITY): Payer: Self-pay | Admitting: Psychiatry

## 2019-07-24 ENCOUNTER — Ambulatory Visit (INDEPENDENT_AMBULATORY_CARE_PROVIDER_SITE_OTHER): Payer: BC Managed Care – PPO | Admitting: Psychiatry

## 2019-07-24 DIAGNOSIS — F5081 Binge eating disorder: Secondary | ICD-10-CM | POA: Diagnosis not present

## 2019-07-24 DIAGNOSIS — F419 Anxiety disorder, unspecified: Secondary | ICD-10-CM | POA: Diagnosis not present

## 2019-07-24 DIAGNOSIS — F331 Major depressive disorder, recurrent, moderate: Secondary | ICD-10-CM

## 2019-07-24 DIAGNOSIS — G4700 Insomnia, unspecified: Secondary | ICD-10-CM

## 2019-07-24 DIAGNOSIS — F411 Generalized anxiety disorder: Secondary | ICD-10-CM

## 2019-07-24 DIAGNOSIS — F988 Other specified behavioral and emotional disorders with onset usually occurring in childhood and adolescence: Secondary | ICD-10-CM | POA: Diagnosis not present

## 2019-07-24 DIAGNOSIS — F329 Major depressive disorder, single episode, unspecified: Secondary | ICD-10-CM

## 2019-07-24 MED ORDER — SERTRALINE HCL 50 MG PO TABS: 50.0000 mg | ORAL_TABLET | Freq: Every day | ORAL | 0 refills | Status: DC

## 2019-07-24 MED ORDER — LISDEXAMFETAMINE DIMESYLATE 30 MG PO CAPS: 30.0000 mg | ORAL_CAPSULE | Freq: Every day | ORAL | 0 refills | Status: DC

## 2019-07-24 MED ORDER — TRAZODONE HCL 100 MG PO TABS: 100.0000 mg | ORAL_TABLET | Freq: Every evening | ORAL | 0 refills | Status: DC | PRN

## 2019-07-24 NOTE — Progress Notes (Signed)
Psychiatric Initial Adult Assessment   Patient Identification: Cory Gomez MRN:  [id number] Date of Evaluation:  07/24/2019 Referral Source: Sheral Flowracy McLean-Scouzza MD Chief Complaint:  Depression, anxiety, insomnia Visit Diagnosis:    ICD-10-CM   1. Major depressive disorder, recurrent episode, moderate (HCC)  F33.1   2. 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 Binge eating disorder  F50.81   4. Anxiety and depression  F41.9 traZODone (DESYREL) 100 MG tablet   F32.9   5. Insomnia, unspecified type  G47.00 traZODone (DESYREL) 100 MG tablet  6. GAD (generalized anxiety disorder)  F41.1   Interview was conducted using WebEx teleconferencing application and I verified that I was speaking with the correct person using two identifiers. I discussed the limitations of evaluation and management by telemedicine and  the availability of in person appointments. Patient expressed understanding and agreed to proceed.  History of Present Illness:  Patient is a 38 year old married male who presents with chronic depression which worsened over past 4 months to the point that he decided seek help. His symptoms are specified below. In addition to depression/anxiety he laso admits to binge eating - feels that when stressed out he overeats. He reports marital conflict and difficulty with communication being likely a trigger for current depression. He has been married just about a year after knowing his wife for just 3 months prior. He was previously married for 2.5 years but after he found his ex wife cheating on him he got divorced. H remarried within 7 months. 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 At home he feels unappreciated for all the effort he puts into household chores. He has started counseling with Angus Palmsegina Alexander. Cory Gomez admits that he has had on and off SI over past months (none in last few weeks though). He has never attempted suicide. This is not the first time that he is  experiencing depression: first time he became depressed in early elementary school after his parents divorced. He was in counseling and prescribed bupropion then. Later, as an adult, he was briefly on fluoxetine with no cleasr benefit. He is currently taking 50 mg of trazodone for initial insomnia but does not see much improvement so far. While in school he was diagnosed with having ADHD inattentive type and was on Ritalin.  He has no hx of inpatient psychiatric admissions, no hx of mania or overt psychosis. He does not abuse alcohol or street drugs.   Associated Signs/Symptoms: Depression Symptoms:  depressed mood, insomnia, fatigue, feelings of worthlessness/guilt, difficulty concentrating, anxiety, loss of energy/fatigue, weight gain, (Hypo) Manic Symptoms:  Irritable Mood, Anxiety Symptoms:  Excessive Worry, Psychotic Symptoms:  None PTSD Symptoms: Negative  Past Psychiatric History: see above  Previous Psychotropic Medications: Yes   Substance Abuse History in the last 12 months:  No.  Consequences of Substance Abuse: NA  Past Medical History:  Past Medical History:  Diagnosis Date  . ADHD (attention deficit hyperactivity disorder)   . Anxiety and depression   . Insomnia   . Obesity (BMI 30-39.9)   . Prediabetes     Past Surgical History:  Procedure Laterality Date  . NO PAST SURGERIES      Family Psychiatric History: Reviewed.  Family History:  Family History  Problem Relation Age of Onset  . Hypertension Mother   . Hyperlipidemia Mother   . Diabetes Mellitus II Mother   . Anxiety disorder Mother   . Depression Mother   . Anxiety disorder  Brother   . Depression Brother   . Hypertension Brother   . Hyperlipidemia Brother   . Diabetes type II Brother   . ADD / ADHD Brother     Social History:   Social History   Socioeconomic History  . Marital status: Married    Spouse name: Not on file  . Number of children: Not on file  . Years of education: Not  on file  . Highest education level: Not on file  Occupational History  . Not on file  Social Needs  . Financial resource strain: Not on file  . Food insecurity    Worry: Not on file    Inability: Not on file  . Transportation needs    Medical: Not on file    Non-medical: Not on file  Tobacco Use  . Smoking status: Never Smoker  . Smokeless tobacco: Never Used  Substance and Sexual Activity  . Alcohol use: Not Currently  . Drug use: Not Currently  . Sexual activity: Yes    Partners: Female  Lifestyle  . Physical activity    Days per week: Not on file    Minutes per session: Not on file  . Stress: Not on file  Relationships  . Social Musicianconnections    Talks on phone: Not on file    Gets together: Not on file    Attends religious service: Not on file    Active member of club or organization: Not on file    Attends meetings of clubs or organizations: Not on file    Relationship status: Not on file  Other Topics Concern  . Not on file  Social History Narrative   Married no kids wife is Delorise Royalsracy Marton    -wife and mother [place]arer[place] on [place]      Has siblings    Works Animal nutritionistComp Tech IT     Additional Social History: NO personal hx of being abused as a child or adult.  Allergies:  No Known Allergies  Metabolic Disorder Labs: Lab Results  Component Value Date   HGBA1C 6.4 06/11/2019   No results found for: PROLACTIN Lab Results  Component Value Date   CHOL 175 06/11/2019   TRIG 200.0 (H) 06/11/2019   HDL 33.20 (L) 06/11/2019   CHOLHDL 5 06/11/2019   VLDL 40.0 06/11/2019   LDLCALC 102 (H) 06/11/2019   Lab Results  Component Value Date   TSH 3.38 06/11/2019    Therapeutic Level Labs: No results found for: LITHIUM No results found for: CBMZ No results found for: VALPROATE  Current Medications: Current Outpatient Medications  Medication Sig Dispense Refill  . [START ON 09/22/2019] lisdexamfetamine (VYVANSE) 30 MG capsule Take 1 capsule (30 mg total) by mouth daily  before breakfast. 30 capsule 0  . sertraline (ZOLOFT) 50 MG tablet Take 1 tablet (50 mg total) by mouth daily. 30 tablet 0  . traZODone (DESYREL) 100 MG tablet Take 1 tablet (100 mg total) by mouth at bedtime as needed for sleep. 30 tablet 0   No current facility-administered medications for this visit.     Psychiatric Specialty Exam: Review of Systems  Constitutional: Positive for malaise/fatigue.  Psychiatric/Behavioral: Positive for depression. The patient is nervous/anxious and has insomnia.   All other systems reviewed and are negative.   There were no vitals taken for this visit.There is no height or weight on file to calculate BMI.  General Appearance: Casual and Fairly Groomed  Eye Contact:  Good  Speech:  Clear and Coherent  and Normal Rate  Volume:  Normal  Mood:  Anxious and Depressed  Affect:  Congruent  Thought Process:  Goal Directed  Orientation:  Full (Time, Place, and Person)  Thought Content:  Logical  Suicidal Thoughts:  Not currently.  Homicidal Thoughts:  No  Memory:  Immediate;   Fair Recent;   Fair Remote;   Good  Judgement:  Good  Insight:  Fair  Psychomotor Activity:  NA  Concentration:  Concentration: Fair  Recall:  [place] of Knowledge:Good  Language: Good  Akathisia:  Negative  Handed:  Right  AIMS (if indicated):  not done  Assets:  Desire for Improvement Financial Resources/Insurance Housing Social Support Talents/Skills  ADL's:  Intact  Cognition: WNL  Sleep:  Poor   Screenings: PHQ2-9     Office Visit from 06/02/2019 in [facility]  PHQ-2 Total Score  6  PHQ-9 Total Score  19      Assessment and Plan: Patient is a 38 year old married male who presents with chronic depression which worsened over past 4 months to the point that he decided seek help. His symptoms are specified below. In addition to depression/anxiety he laso admits to binge eating - feels that when stressed out he overeats. He reports marital  conflict and difficulty with communication being likely a trigger for current depression. He has been married just about a year after knowing his wife for just 3 months prior. He was previously married for 2.5 years but after he found his ex wife cheating on him he got divorced. H remarried within 7 months. 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 At home he feels unappreciated for all the effort he puts into household chores. He has started counseling with Lillie Fragmin. Jahron admits that he has had on and off SI over past months (none in last few weeks though). He has never attempted suicide. This is not the first time that he is experiencing depression: first time he became depressed in early elementary school after his parents divorced. He was in counseling and prescribed bupropion then. Later, as an adult, he was briefly on fluoxetine with no cleasr benefit. He is currently taking 50 mg of trazodone for initial insomnia but does not see much improvement so far. While in school he was diagnosed with having ADHD inattentive type and was on Ritalin.  He has no hx of inpatient psychiatric admissions, no hx of mania or overt psychosis. He does not abuse alcohol or street drugs.   Dx: Major depressive disorder recurrent moderate; GAD; ADD; Binge eating disorder; Dependent traits  Plan: Increase trazodone dose to 100-150 mg prn sleep; start sertraline 50 mg daily for depression/anxiety; a week later start Vyvanse 30 mg for ADD/binge eating disorder. Most likely the dose of both sertraline and Vyvanse will need to be increased in the future. If higher dose of trazodone is not helpful we will try zolpidem next. He will continue counseling with Lillie Fragmin. Next appointment with me in 3 weeks. The plan was discussed with patient who had an opportunity to ask questions and these were all answered. I spend 60 minutes in videoconferencing with the patient and devoted approximately 50% of this time to explanation  of diagnosis, discussion of treatment options and med education.   Stephanie Acre, MD 7/31/20209:48 AM

## 2019-08-10 ENCOUNTER — Ambulatory Visit (HOSPITAL_COMMUNITY): Payer: BC Managed Care – PPO | Admitting: Licensed Clinical Social Worker

## 2019-08-14 ENCOUNTER — Other Ambulatory Visit: Payer: Self-pay

## 2019-08-14 ENCOUNTER — Encounter (HOSPITAL_COMMUNITY): Payer: Self-pay | Admitting: Licensed Clinical Social Worker

## 2019-08-14 ENCOUNTER — Ambulatory Visit (INDEPENDENT_AMBULATORY_CARE_PROVIDER_SITE_OTHER): Payer: BC Managed Care – PPO | Admitting: Licensed Clinical Social Worker

## 2019-08-14 DIAGNOSIS — F331 Major depressive disorder, recurrent, moderate: Secondary | ICD-10-CM | POA: Diagnosis not present

## 2019-08-14 NOTE — Progress Notes (Signed)
Virtual Visit via Video Note  I connected with Cory Gomez on 08/14/19 at  9:00 AM EDT by a video enabled telemedicine application and verified that I am speaking with the correct person using two identifiers.  Location: Patient: Work Provider: Office   I discussed the limitations of evaluation and management by telemedicine and the availability of in person appointments. The patient expressed understanding and agreed to proceed.  THERAPIST PROGRESS NOTE  Session Time: 9:00 am-9:55  Participation Level: Active  Behavioral Response: CasualAlertDepressed  Type of Therapy: Individual Therapy  Treatment Goals addressed: Coping  Interventions: CBT and Solution Focused  Summary: Cory Gomez is a 38 y.o. male who presents oriented x5 (person, place, situation, time and object), casually dressed, appropriately groomed, average height, overweight, and cooperative to address mood. Patient has a history of medical treatment including prediabetes and obesity. Patient has a minimal history of mental health treatment including medication management and outpatient therapy. Patient admits to thoughts of SI but denies a plan and denies homicidal ideations. Patient denies psychosis including auditory and visual hallucinations. Patient denies substance abuse. Patient is at low risk for lethality at this time.  Physically: Patient has been tired. He wakes up constantly worrying about finances. Patient overeats. He tries to go for walks at times but is usually too tired after work.  Spiritually/values: Patient doesn't feel like spirituality is as important to him as it used to be. He feels like his pastor has views that go against the purpose of church. He is no longer attending.  Relationships: Patient feels a strain in his marriage. He feels like is does so much for his wife and doesn't get any gratitude back. He feels taken advantage of.  Emotionally/Mentally/Behavior:  Patient's mood  has been depressed. He feels down related to his marriage. Patient admitted that his first response to interactions with his wife is anger or defensiveness. Patient agreed to pay attention to his thoughts. Patient understood that thoughts lead to feelings/emotions and reactions. Patient also agreed to find ways for self care.   Patient engaged in session. He responded well to interventions. Patient continues to meet criteria for Major depressive disorder, recurrent, moderate. Patient will continue in outpatient therapy due to being the least restrictive service to meet his needs at this time. Patient made minimal progress on his goals.    Suicidal/Homicidal: Negativewithout intent/plan  Therapist Response: Therapist reviewed patient's recent thoughts and behaviors. Therapist utilized CBT to address mood. Therapist processed patient's triggers for depression. Therapist assisted patient in identifying ways to take care of self and pay attention to his thoughts.   Plan: Return again in 4 weeks.  Diagnosis: Axis I: Major depressive disorder, recurrent, moderate    Axis II: No diagnosis   I discussed the assessment and treatment plan with the patient. The patient was provided an opportunity to ask questions and all were answered. The patient agreed with the plan and demonstrated an understanding of the instructions.   The patient was advised to call back or seek an in-person evaluation if the symptoms worsen or if the condition fails to improve as anticipated.  I provided 55 minutes of non-face-to-face time during this encounter.   Glori Bickers, LCSW 08/14/2019

## 2019-08-17 ENCOUNTER — Ambulatory Visit (INDEPENDENT_AMBULATORY_CARE_PROVIDER_SITE_OTHER): Payer: BC Managed Care – PPO | Admitting: Psychiatry

## 2019-08-17 ENCOUNTER — Other Ambulatory Visit: Payer: Self-pay

## 2019-08-17 DIAGNOSIS — F411 Generalized anxiety disorder: Secondary | ICD-10-CM | POA: Diagnosis not present

## 2019-08-17 DIAGNOSIS — F330 Major depressive disorder, recurrent, mild: Secondary | ICD-10-CM | POA: Diagnosis not present

## 2019-08-17 DIAGNOSIS — F5081 Binge eating disorder: Secondary | ICD-10-CM | POA: Diagnosis not present

## 2019-08-17 DIAGNOSIS — G4700 Insomnia, unspecified: Secondary | ICD-10-CM

## 2019-08-17 DIAGNOSIS — F988 Other specified behavioral and emotional disorders with onset usually occurring in childhood and adolescence: Secondary | ICD-10-CM | POA: Diagnosis not present

## 2019-08-17 DIAGNOSIS — F900 Attention-deficit hyperactivity disorder, predominantly inattentive type: Secondary | ICD-10-CM

## 2019-08-17 DIAGNOSIS — F331 Major depressive disorder, recurrent, moderate: Secondary | ICD-10-CM

## 2019-08-17 MED ORDER — LISDEXAMFETAMINE DIMESYLATE 30 MG PO CAPS: 30.0000 mg | ORAL_CAPSULE | Freq: Every day | ORAL | 0 refills | Status: DC

## 2019-08-17 MED ORDER — SERTRALINE HCL 100 MG PO TABS: 100.0000 mg | ORAL_TABLET | Freq: Every day | ORAL | 0 refills | Status: DC

## 2019-08-17 MED ORDER — ZOLPIDEM TARTRATE ER 12.5 MG PO TBCR: 12.5000 mg | EXTENDED_RELEASE_TABLET | Freq: Every evening | ORAL | 0 refills | Status: DC | PRN

## 2019-08-17 NOTE — Progress Notes (Signed)
BH MD/PA/NP OP Progress Note  08/17/2019 2:41 PM Cory Gomez  MRN:  [id number] Interview was conducted by [employer]using WebEx teleconferencing application and I verified that I was speaking with the correct person using two identifiers. I discussed the limitations of evaluation and management by telemedicine and  the availability of in person appointments. Patient expressed understanding and agreed to proceed.  Chief Complaint: Anxiety, middle insomnia.  HPI: Cory Gomez is a 38 year old married male who presents with chronic depression which worsened over past 4 months. In addition to depression/anxiety he laso admits to binge eating - feels that when stressed out he overeats. He reports marital conflict and difficulty with communication being likely a trigger for current depression. He has been married just about a year after knowing his wife for just 3 months prior. He was previously married for 2.5 years but after he found his ex wife cheating on him he got divorced. H remarried within 7 months. 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 At home he feels unappreciated for all the effort he puts into household chores. Cory Gomez admits that he has had on and off SI over past months (none in last few weeks though). He has never attempted suicide and is contracting for safety. This is not the first time that he is experiencing depression: first time he became depressed in early elementary school after his parents divorced. He was in counseling and prescribed bupropion then. Later, as an adult, he was briefly on fluoxetine with no clear benefit. He is currently taking 100 mg of trazodone for insomnia but does not see much improvement as he continues to wake up after few hours. While in school he was diagnosed with having ADHD inattentive type and was on Ritalin.  He has no hx of inpatient psychiatric admissions, no hx of mania or overt psychosis. He does not abuse alcohol or street drugs. Zoloft was added for  depression/anxiety - he tolerates it well but did not notice much improvement in mood.  Visit Diagnosis:    ICD-10-CM   1. Attention deficit hyperactivity disorder (ADHD), predominantly inattentive type  F90.0   2. Binge eating disorder  F50.81   3. Major depressive disorder, recurrent episode, moderate (HCC)  F33.1   4. GAD (generalized anxiety disorder)  F41.1     Past Psychiatric History: Please see intake H&P.  Past Medical History:  Past Medical History:  Diagnosis Date  . ADHD (attention deficit hyperactivity disorder)   . Anxiety and depression   . Insomnia   . Obesity (BMI 30-39.9)   . Prediabetes     Past Surgical History:  Procedure Laterality Date  . NO PAST SURGERIES      Family Psychiatric History: Reviewed.  Family History:  Family History  Problem Relation Age of Onset  . Hypertension Mother   . Hyperlipidemia Mother   . Diabetes Mellitus II Mother   . Anxiety disorder Mother   . Depression Mother   . Anxiety disorder Brother   . Depression Brother   . Hypertension Brother   . Hyperlipidemia Brother   . Diabetes type II Brother   . ADD / ADHD Brother     Social History:  Social History   Socioeconomic History  . Marital status: Married    Spouse name: Not on file  . Number of children: Not on file  . Years of education: Not on file  . Highest education level: Not on file  Occupational History  . Not on  file  Social Needs  . Financial resource strain: Not on file  . Food insecurity    Worry: Not on file    Inability: Not on file  . Transportation needs    Medical: Not on file    Non-medical: Not on file  Tobacco Use  . Smoking status: Never Smoker  . Smokeless tobacco: Never Used  Substance and Sexual Activity  . Alcohol use: Not Currently  . Drug use: Not Currently  . Sexual activity: Yes    Partners: Female  Lifestyle  . Physical activity    Days per week: Not on file    Minutes per session: Not on file  . Stress: Not on file   Relationships  . Social Herbalist on phone: Not on file    Gets together: Not on file    Attends religious service: Not on file    Active member of club or organization: Not on file    Attends meetings of clubs or organizations: Not on file    Relationship status: Not on file  Other Topics Concern  . 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     Allergies: No Known Allergies  Metabolic Disorder Labs: Lab Results  Component Value Date   HGBA1C 6.4 06/11/2019   No results found for: PROLACTIN Lab Results  Component Value Date   CHOL 175 06/11/2019   TRIG 200.0 (H) 06/11/2019   HDL 33.20 (L) 06/11/2019   CHOLHDL 5 06/11/2019   VLDL 40.0 06/11/2019   LDLCALC 102 (H) 06/11/2019   Lab Results  Component Value Date   TSH 3.38 06/11/2019    Therapeutic Level Labs: No results found for: LITHIUM No results found for: VALPROATE No components found for:  CBMZ  Current Medications: Current Outpatient Medications  Medication Sig Dispense Refill  . [START ON 08/24/2019] lisdexamfetamine (VYVANSE) 30 MG capsule Take 1 capsule (30 mg total) by mouth daily before breakfast. 30 capsule 0  . sertraline (ZOLOFT) 100 MG tablet Take 1 tablet (100 mg total) by mouth daily. 30 tablet 0  . zolpidem (AMBIEN CR) 12.5 MG CR tablet Take 1 tablet (12.5 mg total) by mouth at bedtime as needed for sleep. 30 tablet 0   No current facility-administered medications for this visit.      Psychiatric Specialty Exam: Review of Systems  Psychiatric/Behavioral: Positive for depression. The patient is nervous/anxious and has insomnia.   All other systems reviewed and are negative.   There were no vitals taken for this visit.There is no height or weight on file to calculate BMI.  General Appearance: NA  Eye Contact:  NA  Speech:  Clear and Coherent and Normal Rate  Volume:   Normal  Mood:  Anxious and Depressed  Affect:  NA  Thought Process:  Goal Directed and Linear  Orientation:  Full (Time, Place, and Person)  Thought Content: Logical   Suicidal Thoughts:  No  Homicidal Thoughts:  No  Memory:  Immediate;   Good Recent;   Good Remote;   Good  Judgement:  Good  Insight:  Fair  Psychomotor Activity:  NA  Concentration:  Concentration: Fair  Recall:  Good  Fund of Knowledge: Good  Language: Good  Akathisia:  Negative  Handed:  Right  AIMS (if indicated): not done  Assets:  Communication Skills Desire for Improvement Financial Resources/Insurance Housing Resilience Talents/Skills  ADL's:  Intact  Cognition: WNL  Sleep:  Fair   Screenings: PHQ2-9     [facility]/02/2019 in [facility]  PHQ-2 Total Score  6  PHQ-9 Total Score  19       Assessment and Plan: Cory Gomez is a [age] year old married male who presents with chronic depression which worsened over past 4 months. In addition to depression/anxiety he laso admits to binge eating - feels that when stressed out he overeats. He reports marital conflict and difficulty with communication being likely a trigger for current depression. He has been married just about a year after knowing his wife for just 3 months prior. He was previously married for 2.5 years but after he found his ex wife cheating on him he got divorced. H remarried within 7 months. 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 At home he feels unappreciated for all the effort he puts into household chores. Kahle admits that he has had on and off SI over past months (none in last few weeks though). He has never attempted suicide and is contracting for safety. This is not the first time that he is experiencing depression: first time he became depressed in early elementary school after his parents divorced. He was in counseling and prescribed bupropion then. Later, as an adult, he was briefly on fluoxetine with no clear  benefit. He is currently taking 100 mg of trazodone for insomnia but does not see much improvement as he continues to wake up after few hours. While in school he was diagnosed with having ADHD inattentive type and was on Ritalin.  He has no hx of inpatient psychiatric admissions, no hx of mania or overt psychosis. He does not abuse alcohol or street drugs. Zoloft was added for depression/anxiety - he tolerates it well but did not notice much improvement in mood.  Dx: Major depressive disorder recurrent moderate; GAD; ADD; Binge eating disorder; Dependent traits  Plan: Increase sertraline to 100 mg, dc trazodone and try zolpidem CR instead. Vyvanse 30 mg for ADD/binge eating disorder. Most likely the dose of Vyvanse will need to be increased in the future. He will continue counseling with Bynum BellowsJoshua Sheets. Next appointment with me in 4 weeks. The plan was discussed with patient who had an opportunity to ask questions and these were all answered. I spend 25 minutes in phone consultation with the patient     Magdalene Patricialgierd A Romulus Hanrahan, MD 08/17/2019, 2:41 PM

## 2019-09-07 ENCOUNTER — Other Ambulatory Visit: Payer: Self-pay

## 2019-09-08 ENCOUNTER — Other Ambulatory Visit: Payer: Self-pay

## 2019-09-08 ENCOUNTER — Ambulatory Visit (INDEPENDENT_AMBULATORY_CARE_PROVIDER_SITE_OTHER): Payer: BC Managed Care – PPO | Admitting: Internal Medicine

## 2019-09-08 ENCOUNTER — Encounter: Payer: Self-pay | Admitting: Internal Medicine

## 2019-09-08 VITALS — BP 132/78 | HR 86 | Temp 98.3°F | Ht 73.8 in | Wt 317.4 lb

## 2019-09-08 DIAGNOSIS — H6122 Impacted cerumen, left ear: Secondary | ICD-10-CM

## 2019-09-08 DIAGNOSIS — L709 Acne, unspecified: Secondary | ICD-10-CM | POA: Diagnosis not present

## 2019-09-08 DIAGNOSIS — F339 Major depressive disorder, recurrent, unspecified: Secondary | ICD-10-CM | POA: Diagnosis not present

## 2019-09-08 DIAGNOSIS — R748 Abnormal levels of other serum enzymes: Secondary | ICD-10-CM | POA: Diagnosis not present

## 2019-09-08 DIAGNOSIS — Z0000 Encounter for general adult medical examination without abnormal findings: Secondary | ICD-10-CM

## 2019-09-08 DIAGNOSIS — H6192 Disorder of left external ear, unspecified: Secondary | ICD-10-CM

## 2019-09-08 DIAGNOSIS — G4700 Insomnia, unspecified: Secondary | ICD-10-CM

## 2019-09-08 DIAGNOSIS — Z23 Encounter for immunization: Secondary | ICD-10-CM | POA: Diagnosis not present

## 2019-09-08 DIAGNOSIS — R45851 Suicidal ideations: Secondary | ICD-10-CM

## 2019-09-08 DIAGNOSIS — N6459 Other signs and symptoms in breast: Secondary | ICD-10-CM

## 2019-09-08 MED ORDER — DEBROX 6.5 % OT SOLN: 5.0000 [drp] | Freq: Two times a day (BID) | OTIC | 0 refills | Status: DC

## 2019-09-08 MED ORDER — BENZOYL PEROXIDE 10 % EX LIQD: 1.0000 | Freq: Every day | CUTANEOUS | 11 refills | Status: DC | PRN

## 2019-09-08 NOTE — Progress Notes (Addendum)
Chief Complaint  Patient presents with  . Follow-up   Annual  1. C/o left ear pain h/o wax in ear  2. Acne to back  3. C/o worsening depression with SI and plan though pt did not specify today he f/u with therapy and psych no current meds but vyvanse waiting approval and zoloft 100 mg qd and ambien 12.5 mg qod he has f/u psych and therapy 09/16/19 but I will reach back out to them. He reports waking up at night and only sleeping 4-5 hrs, marital stress due to finances and wife not working and his family is concerned for him PHQ 9 score 26 and GAD 7 score 20 today  4. Reviewed labs elevated lfts will need to resch US abdomen    Review of Systems  Constitutional: Negative for weight loss.  HENT: Positive for ear pain. Negative for hearing loss.   Eyes: Negative for blurred vision.  Respiratory: Negative for shortness of breath.   Cardiovascular: Negative for chest pain.  Gastrointestinal: Negative for abdominal pain.  Musculoskeletal: Negative for falls.  Skin: Positive for rash.       Acne to low back    Neurological: Negative for headaches.  Psychiatric/Behavioral: Positive for depression and suicidal ideas. The patient has insomnia.    Past Medical History:  Diagnosis Date  . ADHD (attention deficit hyperactivity disorder)   . Anxiety and depression   . Insomnia   . Obesity (BMI 30-39.9)   . Prediabetes    Past Surgical History:  Procedure Laterality Date  . NO PAST SURGERIES     Family History  Problem Relation Age of Onset  . Hypertension Mother   . Hyperlipidemia Mother   . Diabetes Mellitus II Mother   . Anxiety disorder Mother   . Depression Mother   . Anxiety disorder Brother   . Depression Brother   . Hypertension Brother   . Hyperlipidemia Brother   . Diabetes type II Brother   . ADD / ADHD Brother    Social History   Socioeconomic History  . Marital status: Married    Spouse name: Not on file  . Number of children: Not on file  . Years of education:  Not on file  . Highest education level: Not on file  Occupational History  . Not on file  Social Needs  . Financial resource strain: Not on file  . Food insecurity    Worry: Not on file    Inability: Not on file  . Transportation needs    Medical: Not on file    Non-medical: Not on file  Tobacco Use  . Smoking status: Never Smoker  . Smokeless tobacco: Never Used  Substance and Sexual Activity  . Alcohol use: Not Currently  . Drug use: Not Currently  . Sexual activity: Yes    Partners: Female  Lifestyle  . Physical activity    Days per week: Not on file    Minutes per session: Not on file  . Stress: Not on file  Relationships  . Social Musician on phone: Not on file    Gets together: Not on file    Attends religious service: Not on file    Active member of club or organization: Not on file    Attends meetings of clubs or organizations: Not on file    Relationship status: Not on file  . Intimate partner violence    Fear of current or ex partner: Not on file  Emotionally abused: Not on file    Physically abused: Not on file    Forced sexual activity: Not on file  Other Topics Concern  . 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 sertraline (ZOLOFT) 100 MG tablet Take 1 tablet (100 mg total) by mouth daily.  Marland Kitchen zolpidem (AMBIEN CR) 12.5 MG CR tablet Take 1 tablet (12.5 mg total) by mouth at bedtime as needed for sleep.   No Known Allergies Recent Results (from the past 2160 hour(s))  Vitamin D (25 hydroxy)     Status: Abnormal   Collection Time: 06/11/19 11:09 AM  Result Value Ref Range   VITD 27.48 (L) 30.00 - 100.00 ng/mL  Urinalysis, Routine w reflex microscopic     Status: Abnormal   Collection Time: 06/11/19 11:09 AM  Result Value Ref Range   Color, Urine YELLOW YELLOW   APPearance TURBID (A)  CLEAR   Specific Gravity, Urine 1.021 1.001 - 1.03   pH 5.5 5.0 - 8.0   Glucose, UA NEGATIVE NEGATIVE   Bilirubin Urine NEGATIVE NEGATIVE   Ketones, ur NEGATIVE NEGATIVE   Hgb urine dipstick NEGATIVE NEGATIVE   Protein, ur NEGATIVE NEGATIVE   Nitrite NEGATIVE NEGATIVE   Leukocytes,Ua NEGATIVE NEGATIVE  TSH     Status: None   Collection Time: 06/11/19 11:09 AM  Result Value Ref Range   TSH 3.38 0.35 - 4.50 uIU/mL  Lipid panel     Status: Abnormal   Collection Time: 06/11/19 11:09 AM  Result Value Ref Range   Cholesterol 175 0 - 200 mg/dL    Comment: ATP III Classification       Desirable:  < 200 mg/dL               Borderline High:  200 - 239 mg/dL          High:  > = 240 mg/dL   Triglycerides 200.0 (H) 0.0 - 149.0 mg/dL    Comment: Normal:  <150 mg/dLBorderline High:  150 - 199 mg/dL   HDL 33.20 (L) >39.00 mg/dL   VLDL 40.0 0.0 - 40.0 mg/dL   LDL Cholesterol 102 (H) 0 - 99 mg/dL   Total CHOL/HDL Ratio 5     Comment:                Men          Women1/2 Average Risk     3.4          3.3Average Risk          5.0          4.42X Average Risk          9.6          7.13X Average Risk          15.0          11.0                       NonHDL 141.54     Comment: NOTE:  Non-HDL goal should be 30 mg/dL higher than patient's LDL goal (i.e. LDL goal of < 70 mg/dL, would have non-HDL goal of < 100 mg/dL)  Hemoglobin A1c     Status: None   Collection  Time: 06/11/19 11:09 AM  Result Value Ref Range   Hgb A1c MFr Bld 6.4 4.6 - 6.5 %    Comment: Glycemic Control Guidelines for People with Diabetes:Non Diabetic:  <6%Goal of Therapy: <7%Additional Action Suggested:  >8%   CBC w/Diff     Status: None   Collection Time: 06/11/19 11:09 AM  Result Value Ref Range   WBC 5.8 4.0 - 10.5 K/uL   RBC 4.71 4.22 - 5.81 Mil/uL   Hemoglobin 14.5 13.0 - 17.0 g/dL   HCT 16.142.4 09.639.0 - 04.552.0 %   MCV 89.9 78.0 - 100.0 fl   MCHC 34.2 30.0 - 36.0 g/dL   RDW 40.913.0 81.111.5 - 91.415.5 %   Platelets 286.0 150.0 - 400.0 K/uL    Neutrophils Relative % 55.2 43.0 - 77.0 %   Lymphocytes Relative 33.7 12.0 - 46.0 %   Monocytes Relative 8.2 3.0 - 12.0 %   Eosinophils Relative 1.8 0.0 - 5.0 %   Basophils Relative 1.1 0.0 - 3.0 %   Neutro Abs 3.2 1.4 - 7.7 K/uL   Lymphs Abs 2.0 0.7 - 4.0 K/uL   Monocytes Absolute 0.5 0.1 - 1.0 K/uL   Eosinophils Absolute 0.1 0.0 - 0.7 K/uL   Basophils Absolute 0.1 0.0 - 0.1 K/uL  Comprehensive metabolic panel     Status: Abnormal   Collection Time: 06/11/19 11:09 AM  Result Value Ref Range   Sodium 136 135 - 145 mEq/L   Potassium 4.2 3.5 - 5.1 mEq/L   Chloride 102 96 - 112 mEq/L   CO2 25 19 - 32 mEq/L   Glucose, Bld 90 70 - 99 mg/dL   BUN 13 6 - 23 mg/dL   Creatinine, Ser 7.820.76 0.40 - 1.50 mg/dL   Total Bilirubin 0.5 0.2 - 1.2 mg/dL   Alkaline Phosphatase 54 39 - 117 U/L   AST 42 (H) 0 - 37 U/L   ALT 57 (H) 0 - 53 U/L   Total Protein 7.2 6.0 - 8.3 g/dL   Albumin 4.5 3.5 - 5.2 g/dL   Calcium 9.5 8.4 - 95.610.5 mg/dL   GFR 213.08116.28 >65.78>60.00 mL/min   Objective  Body mass index is 40.97 kg/m. Wt Readings from Last 3 Encounters:  09/08/19 (!) 317 lb 6.4 oz (144 kg)   Temp Readings from Last 3 Encounters:  09/08/19 98.3 F (36.8 C) (Temporal)   BP Readings from Last 3 Encounters:  09/08/19 132/78   Pulse Readings from Last 3 Encounters:  09/08/19 86    Physical Exam Vitals signs and nursing note reviewed.  Constitutional:      Appearance: Normal appearance. He is well-developed and well-groomed. He is obese.  HENT:     Head: Normocephalic and atraumatic.     Comments: +mask on   Cardiovascular:     Rate and Rhythm: Normal rate and regular rhythm.     Heart sounds: Normal heart sounds.  Pulmonary:     Effort: Pulmonary effort is normal.     Breath sounds: Normal breath sounds.  Chest:    Skin:    General: Skin is warm and dry.          Comments: Acne to low back    Neurological:     General: No focal deficit present.     Mental Status: He is alert and oriented  to person, place, and time. Mental status is at baseline.     Gait: Gait normal.  Psychiatric:        Attention and  Perception: Attention and perception normal.        Mood and Affect: Mood and affect normal.        Speech: Speech normal.        Behavior: Behavior normal. Behavior is cooperative.        Thought Content: Thought content normal.        Cognition and Memory: Cognition and memory normal.        Judgment: Judgment normal.     Assessment  Plan  Annual physical exam Flu shot given today Tdap rec in future   Will need to exercise to lose wt  rec healthy diet and exercise  Elevated liver enzymes - Plan: Hepatic function panel resch US abdomen   Acne, unspecified acne type - Plan: Benzoyl Peroxide 10 % LIQD  Excessive ear wax, left - Plan: carbamide peroxide (DEBROX) 6.5 % OTIC solution, Ambulatory referral to ENT Dr. Andee PolesVAught only   Skin lesion of left ear - Plan: Ambulatory referral to ENT see above   Depression, recurrent (HCC) uncontrolled with SI with plan though not expressing active today  Insomnia, unspecified type Suicidal ideation  -sent message to psychiatry and therapy needs to f/u asap appt 09/16/19    Former PCP Dr. Dewaine Oatsenny Tate  Provider: Dr. French Anaracy [facility]

## 2019-09-08 NOTE — Patient Instructions (Addendum)
L theanine 100-200 mg at night   The next 56 days online nutrition program   Multivitamin with vitamin D3 or D3 2000 IU daily   Cetaphil antibacterial gel or soap bar over the counter     Exercising to Lose Weight Exercise is structured, repetitive physical activity to improve fitness and health. Getting regular exercise is important for everyone. It is especially important if you are overweight. Being overweight increases your risk of heart disease, stroke, diabetes, high blood pressure, and several types of cancer. Reducing your calorie intake and exercising can help you lose weight. Exercise is usually categorized as moderate or vigorous intensity. To lose weight, most people need to do a certain amount of moderate-intensity or vigorous-intensity exercise each week. Moderate-intensity exercise  Moderate-intensity exercise is any activity that gets you moving enough to burn at least three times more energy (calories) than if you were sitting. Examples of moderate exercise include:  Walking a mile in 15 minutes.  Doing light yard work.  Biking at an easy pace. Most people should get at least 150 minutes (2 hours and 30 minutes) a week of moderate-intensity exercise to maintain their body weight. Vigorous-intensity exercise Vigorous-intensity exercise is any activity that gets you moving enough to burn at least six times more calories than if you were sitting. When you exercise at this intensity, you should be working hard enough that you are not able to carry on a conversation. Examples of vigorous exercise include:  Running.  Playing a team sport, such as football, basketball, and soccer.  Jumping rope. Most people should get at least 75 minutes (1 hour and 15 minutes) a week of vigorous-intensity exercise to maintain their body weight. How can exercise affect me? When you exercise enough to burn more calories than you eat, you lose weight. Exercise also reduces body fat and  builds muscle. The more muscle you have, the more calories you burn. Exercise also:  Improves mood.  Reduces stress and tension.  Improves your overall fitness, flexibility, and endurance.  Increases bone strength. The amount of exercise you need to lose weight depends on:  Your age.  The type of exercise.  Any health conditions you have.  Your overall physical ability. Talk to your health care provider about how much exercise you need and what types of activities are safe for you. What actions can I take to lose weight? Nutrition   Make changes to your diet as told by your health care provider or diet and nutrition specialist (dietitian). This may include: ? Eating fewer calories. ? Eating more protein. ? Eating less unhealthy fats. ? Eating a diet that includes fresh fruits and vegetables, whole grains, low-fat dairy products, and lean protein. ? Avoiding foods with added fat, salt, and sugar.  Drink plenty of water while you exercise to prevent dehydration or heat stroke. Activity  Choose an activity that you enjoy and set realistic goals. Your health care provider can help you make an exercise plan that works for you.  Exercise at a moderate or vigorous intensity most days of the week. ? The intensity of exercise may vary from person to person. You can tell how intense a workout is for you by paying attention to your breathing and heartbeat. Most people will notice their breathing and heartbeat get faster with more intense exercise.  Do resistance training twice each week, such as: ? Push-ups. ? Sit-ups. ? Lifting weights. ? Using resistance bands.  Getting short amounts of exercise can be just  as helpful as long structured periods of exercise. If you have trouble finding time to exercise, try to include exercise in your daily routine. ? Get up, stretch, and walk around every 30 minutes throughout the day. ? Go for a walk during your lunch break. ? Park your car  farther away from your destination. ? If you take public transportation, get off one stop early and walk the rest of the way. ? Make phone calls while standing up and walking around. ? Take the stairs instead of elevators or escalators.  Wear comfortable clothes and shoes with good support.  Do not exercise so much that you hurt yourself, feel dizzy, or get very short of breath. Where to find more information  U.S. Department of Health and Human Services: ThisPath.fiwww.hhs.gov  Centers for Disease Control and Prevention (CDC): FootballExhibition.com.brwww.cdc.gov Contact a health care provider:  Before starting a new exercise program.  If you have questions or concerns about your weight.  If you have a medical problem that keeps you from exercising. Get help right away if you have any of the following while exercising:  Injury.  Dizziness.  Difficulty breathing or shortness of breath that does not go away when you stop exercising.  Chest pain.  Rapid heartbeat. Summary  Being overweight increases your risk of heart disease, stroke, diabetes, high blood pressure, and several types of cancer.  Losing weight happens when you burn more calories than you eat.  Reducing the amount of calories you eat in addition to getting regular moderate or vigorous exercise each week helps you lose weight. This information is not intended to replace advice given to you by your health care provider. Make sure you discuss any questions you have with your health care provider. Document Released: 01/12/2011 Document Revised: 12/23/2017 Document Reviewed: 12/23/2017 Elsevier Patient Education  2020 [employer]

## 2019-09-09 ENCOUNTER — Telehealth (HOSPITAL_COMMUNITY): Payer: Self-pay | Admitting: Licensed Clinical Social Worker

## 2019-09-09 DIAGNOSIS — F339 Major depressive disorder, recurrent, unspecified: Secondary | ICD-10-CM

## 2019-09-09 DIAGNOSIS — Z0000 Encounter for general adult medical examination without abnormal findings: Secondary | ICD-10-CM | POA: Insufficient documentation

## 2019-09-09 DIAGNOSIS — H6122 Impacted cerumen, left ear: Secondary | ICD-10-CM

## 2019-09-09 DIAGNOSIS — L709 Acne, unspecified: Secondary | ICD-10-CM | POA: Insufficient documentation

## 2019-09-09 HISTORY — DX: Impacted cerumen, left ear: H61.22

## 2019-09-09 HISTORY — DX: Encounter for general adult medical examination without abnormal findings: Z00.00

## 2019-09-09 HISTORY — DX: Major depressive disorder, recurrent, unspecified: F33.9

## 2019-09-09 LAB — HEPATIC FUNCTION PANEL
ALT: 43 U/L (ref 0–53)
AST: 27 U/L (ref 0–37)
Albumin: 4.4 g/dL (ref 3.5–5.2)
Alkaline Phosphatase: 54 U/L (ref 39–117)
Bilirubin, Direct: 0.1 mg/dL (ref 0.0–0.3)
Total Bilirubin: 0.4 mg/dL (ref 0.2–1.2)
Total Protein: 7.3 g/dL (ref 6.0–8.3)

## 2019-09-09 NOTE — Telephone Encounter (Signed)
I contacted patient to check in on mood and SI. His PCP contacted me concerned over SI with plan but no intent. Left message to call back to ensure he was feeling stable and provided my email as well since he was at work most likely.

## 2019-09-14 ENCOUNTER — Other Ambulatory Visit: Payer: Self-pay

## 2019-09-14 ENCOUNTER — Ambulatory Visit (INDEPENDENT_AMBULATORY_CARE_PROVIDER_SITE_OTHER): Payer: BC Managed Care – PPO | Admitting: Licensed Clinical Social Worker

## 2019-09-14 DIAGNOSIS — F331 Major depressive disorder, recurrent, moderate: Secondary | ICD-10-CM | POA: Diagnosis not present

## 2019-09-15 NOTE — Progress Notes (Signed)
Virtual Visit via Video Note  I connected with Cory Gomez on 09/15/19 at  4:00 PM EDT by a video enabled telemedicine application and verified that I am speaking with the correct person using two identifiers.  Location: Patient: Work Provider: Office   I discussed the limitations of evaluation and management by telemedicine and the availability of in person appointments. The patient expressed understanding and agreed to proceed.  THERAPIST PROGRESS NOTE  Session Time: 4:00 pm-4:45 pm  Participation Level: Active  Behavioral Response: CasualAlertDepressed  Type of Therapy: Individual Therapy  Treatment Goals addressed: Coping  Interventions: CBT and Solution Focused  Summary: Cory Gomez is a 38 y.o. male who presents oriented x5 (person, place, situation, time and object), casually dressed, appropriately groomed, average height, overweight, and cooperative to address mood. Patient has a history of medical treatment including prediabetes and obesity. Patient has a minimal history of mental health treatment including medication management and outpatient therapy. Patient admits to thoughts of SI but denies a plan and denies homicidal ideations. Patient denies psychosis including auditory and visual hallucinations. Patient denies substance abuse. Patient is at low risk for lethality at this time.  Session # 2  Physically: Patient continues to feel tired. Patient has difficulty with sleep. He is tried from working all day but has disrupted sleep. Patient continues to engage in stress eating. Patient was sent forms related to overeating the mail to complete.   Spiritually/values: No issues identified.  Relationships: Patient continues to have a strained relationship with his spouse. They had an argument and he told her that he doesn't feel like she loves him.  He said this in response to feeling like she doesn't do anything around the home and is not looking for a job.  Patient continues to feel taken advantage of. He also doesn't express any major concern with his wife potentially leaving him. He feels like a part of him would be sad but the other part would feel relief. After discussion, patient understood that he needs to address this argument with his wife and not just expect the situation to "go away." He needs to try to repair the tear with her.  Emotionally/Mentally/Behavior:  Patient's mood has been depressed. He feels down related to his marriage and financial stress. Patient stated that he and his wife have a budget but are "treading water." He also stated that he could work a second job but doesn't feel like he should have since his wife could work but just isn't.   Patient engaged in session. He responded well to interventions. Patient continues to meet criteria for Major depressive disorder, recurrent, moderate. Patient will continue in outpatient therapy due to being the least restrictive service to meet his needs at this time. Patient made minimal progress on his goals.    Suicidal/Homicidal: Negativewithout intent/plan  Therapist Response: Therapist reviewed patient's recent thoughts and behaviors. Therapist utilized CBT to address mood. Therapist processed patient's triggers for depression. Therapist assisted patient in helping him resolve issues related to his marriage and finances.   Plan: Return again in 2- 4 weeks.   Diagnosis: Axis I: Major depressive disorder, recurrent, moderate    Axis II: No diagnosis   I discussed the assessment and treatment plan with the patient. The patient was provided an opportunity to ask questions and all were answered. The patient agreed with the plan and demonstrated an understanding of the instructions.   The patient was advised to call back or seek an in-person  evaluation if the symptoms worsen or if the condition fails to improve as anticipated.  I provided 45 minutes of non-face-to-face time during this  encounter.   Glori Bickers, LCSW 09/15/2019

## 2019-09-16 ENCOUNTER — Ambulatory Visit (INDEPENDENT_AMBULATORY_CARE_PROVIDER_SITE_OTHER): Payer: BC Managed Care – PPO | Admitting: Psychiatry

## 2019-09-16 ENCOUNTER — Other Ambulatory Visit: Payer: Self-pay

## 2019-09-16 DIAGNOSIS — F411 Generalized anxiety disorder: Secondary | ICD-10-CM

## 2019-09-16 DIAGNOSIS — N6459 Other signs and symptoms in breast: Secondary | ICD-10-CM

## 2019-09-16 DIAGNOSIS — F331 Major depressive disorder, recurrent, moderate: Secondary | ICD-10-CM

## 2019-09-16 DIAGNOSIS — F900 Attention-deficit hyperactivity disorder, predominantly inattentive type: Secondary | ICD-10-CM

## 2019-09-16 HISTORY — DX: Other signs and symptoms in breast: N64.59

## 2019-09-16 MED ORDER — SERTRALINE HCL 100 MG PO TABS: 150.0000 mg | ORAL_TABLET | Freq: Every day | ORAL | 1 refills | Status: DC

## 2019-09-16 MED ORDER — ZOLPIDEM TARTRATE ER 12.5 MG PO TBCR: 12.5000 mg | EXTENDED_RELEASE_TABLET | Freq: Every evening | ORAL | 1 refills | Status: DC | PRN

## 2019-09-16 NOTE — Progress Notes (Signed)
BH MD/PA/NP OP Progress Note  09/16/2019 2:43 PM Cory Gomez  MRN:  [id number] Interview was conducted by phone and I verified that I was speaking with the correct person using two identifiers. I discussed the limitations of evaluation and management by telemedicine and  the availability of in person appointments. Patient expressed understanding and agreed to proceed.  Chief Complaint: Depression, anxiety, lack of focus.  HPI: Cory Gomez is a 38 year old married male who presents with chronic depression which worsened over past 4 months. In addition to depression/anxiety he also admits to binge eating - feels that when stressed out he overeats. He reports marital conflict and difficulty with communication being likely a trigger for current depression. He has been married just about a year after knowing his wife for just 3 months prior. He was previously married for 2.5 years but after he found his ex wife cheating on him he got divorced. He remarried within 7 months. 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 At home he feels unappreciated for all the effort he puts into household chores. He has been more stressed out lately and depression intensified to the point of him having passive SI. Financial stressors dominate at this time. His wife just resumed work and it may ease their financial situation. He denies feeling suicidal at this time and is contracting for safety. Cory Gomez has had on and off SI over past months. He has never attempted suicide. This is not the first time that he is experiencing depression: first time he became depressed in early elementary school after his parents divorced. He was in counseling and prescribed bupropion then. Later, as an adult, he was briefly on fluoxetine with no clear benefit. He was taking 100 mg of trazodone for insomnia but did not see much improvement so we stopped it and started Ambien CR. Sleep has improved. While in school he was diagnosed with having ADHD  inattentive type and was on Ritalin. Zoloft was added for depression/anxiety - he tolerates it well - we increased dose to 100 mg a month ago. We also added Vyvanse but apparently it needs to be preauthorized which has not been done as yet.   Visit Diagnosis:    ICD-10-CM   1. Attention deficit hyperactivity disorder (ADHD), predominantly inattentive type  F90.0   2. GAD (generalized anxiety disorder)  F41.1   3. Major depressive disorder, recurrent episode, moderate (HCC)  F33.1     Past Psychiatric History: Please see intake H&P.  Past Medical History:  Past Medical History:  Diagnosis Date  . ADHD (attention deficit hyperactivity disorder)   . Anxiety and depression   . Insomnia   . Obesity (BMI 30-39.9)   . Prediabetes     Past Surgical History:  Procedure Laterality Date  . NO PAST SURGERIES      Family Psychiatric History: Reviewed.  Family History:  Family History  Problem Relation Age of Onset  . Hypertension Mother   . Hyperlipidemia Mother   . Diabetes Mellitus II Mother   . Anxiety disorder Mother   . Depression Mother   . Anxiety disorder Brother   . Depression Brother   . Hypertension Brother   . Hyperlipidemia Brother   . Diabetes type II Brother   . ADD / ADHD Brother     Social History:  Social History   Socioeconomic History  . Marital status: Married    Spouse name: Not on file  . Number of children: Not on file  .  Years of education: Not on file  . Highest education level: Not on file  Occupational History  . Not on file  Social Needs  . Financial resource strain: Not on file  . Food insecurity    Worry: Not on file    Inability: Not on file  . Transportation needs    Medical: Not on file    Non-medical: Not on file  Tobacco Use  . Smoking status: Never Smoker  . Smokeless tobacco: Never Used  Substance and Sexual Activity  . Alcohol use: Not Currently  . Drug use: Not Currently  . Sexual activity: Yes    Partners: Female   Lifestyle  . Physical activity    Days per week: Not on file    Minutes per session: Not on file  . Stress: Not on file  Relationships  . Social Musician on phone: Not on file    Gets together: Not on file    Attends religious service: Not on file    Active member of club or organization: Not on file    Attends meetings of clubs or organizations: Not on file    Relationship status: Not on file  Other Topics Concern  . Not on file  Social History Narrative   Married no kids wife is Seaborn Nakama    -wife and mother [name] on [place]      Has siblings    Works Educational psychologist IT     Allergies: No Known Allergies  Metabolic Disorder Labs: Lab Results  Component Value Date   HGBA1C 6.4 06/11/2019   No results found for: PROLACTIN Lab Results  Component Value Date   CHOL 175 06/11/2019   TRIG 200.0 (H) 06/11/2019   HDL 33.20 (L) 06/11/2019   CHOLHDL 5 06/11/2019   VLDL 40.0 06/11/2019   LDLCALC 102 (H) 06/11/2019   Lab Results  Component Value Date   TSH 3.38 06/11/2019    Therapeutic Level Labs: No results found for: LITHIUM No results found for: VALPROATE No components found for:  CBMZ  Current Medications: Current Outpatient Medications  Medication Sig Dispense Refill  . Benzoyl Peroxide 10 % LIQD Apply 1 Bottle topically daily as needed. Acne Be careful may bleach clothing 160 g 11  . carbamide peroxide (DEBROX) 6.5 % OTIC solution Place 5 drops into the left ear 2 (two) times daily. X 4-7 days left ear 30 mL 0  . lisdexamfetamine (VYVANSE) 30 MG capsule Take 1 capsule (30 mg total) by mouth daily before breakfast. (Patient not taking: Reported on 09/08/2019) 30 capsule 0  . sertraline (ZOLOFT) 100 MG tablet Take 1 tablet (100 mg total) by mouth daily. 30 tablet 0  . zolpidem (AMBIEN CR) 12.5 MG CR tablet Take 1 tablet (12.5 mg total) by mouth at bedtime as needed for sleep. 30 tablet 0   No current facility-administered medications for this visit.       Psychiatric Specialty Exam: Review of Systems  Psychiatric/Behavioral: Positive for depression. The patient is nervous/anxious.   All other systems reviewed and are negative.   There were no vitals taken for this visit.There is no height or weight on file to calculate BMI.  General Appearance: NA  Eye Contact:  NA  Speech:  Clear and Coherent and Normal Rate  Volume:  Normal  Mood:  Anxious and Depressed  Affect:  NA  Thought Process:  Goal Directed and Linear  Orientation:  Full (Time, Place, and Person)  Thought Content:  Logical   Suicidal Thoughts:  No  Homicidal Thoughts:  No  Memory:  Immediate;   Fair Recent;   Good Remote;   Good  Judgement:  Good  Insight:  Fair  Psychomotor Activity:  NA  Concentration:  Concentration: Fair  Recall:  [place] of Knowledge: Good  Language: Good  Akathisia:  Negative  Handed:  Right  AIMS (if indicated): not done  Assets:  Communication Skills Desire for [place] Talents/Skills  ADL's:  Intact  Cognition: WNL  Sleep:  Fair   Screenings: GAD-7     Office Visit from 09/08/2019 in [place]  Total GAD-7 Score  20    PHQ2-9     Office Visit from 09/08/2019 in [facility] Office Visit from 06/02/2019 in [place]  PHQ-2 Total Score  6  6  PHQ-9 Total Score  26  19       Assessment and Plan: Cory Gomez is a 38 year old married male who presents with chronic depression which worsened over past 4 months. In addition to depression/anxiety he also admits to binge eating - feels that when stressed out he overeats. He reports marital conflict and difficulty with communication being likely a trigger for current depression. He has been married just about a year after knowing his wife for just 3 months prior. He was previously married for 2.5 years but after he found his ex wife cheating on him he got divorced. He remarried within 7  months. 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 At home he feels unappreciated for all the effort he puts into household chores. He has been more stressed out lately and depression intensified to the point of him having passive SI. Financial stressors dominate at this time. His wife just resumed work and it may ease their financial situation. He denies feeling suicidal at this time and is contracting for safety. Cory Gomez has had on and off SI over past months. He has never attempted suicide. This is not the first time that he is experiencing depression: first time he became depressed in early elementary school after his parents divorced. He was in counseling and prescribed bupropion then. Later, as an adult, he was briefly on fluoxetine with no clear benefit. He was taking 100 mg of trazodone for insomnia but did not see much improvement so we stopped it and started Ambien CR. Sleep has improved. While in school he was diagnosed with having ADHD inattentive type and was on Ritalin. Zoloft was added for depression/anxiety - he tolerates it well - we increased dose to 100 mg a month ago. We also added Vyvanse but apparently it needs preauthorization which has not been done as yet.  Dx: Major depressive disorder recurrent moderate; GAD; ADD; Binge eating disorder; Dependent traits  Plan: Increase sertraline to 150 mg, continue zolpidem CR.  We need to get Vyvanse 30 mg for ADD/binge eating disorder pre-authorized. Most likely the dose of Vyvanse will need to be increased in the future. He will continue counseling with Glori Bickers. Next appointment with me in 6 weeks.The plan was discussed with patient who had an opportunity to ask questions and these were all answered. I spend55minutes in phone consultation with the patient     Stephanie Acre, MD 09/16/2019, 2:43 PM

## 2019-09-17 ENCOUNTER — Telehealth (HOSPITAL_COMMUNITY): Payer: Self-pay

## 2019-09-17 ENCOUNTER — Other Ambulatory Visit (HOSPITAL_COMMUNITY): Payer: Self-pay | Admitting: Psychiatry

## 2019-09-17 MED ORDER — AMPHETAMINE-DEXTROAMPHET ER 25 MG PO CP24: 25.0000 mg | ORAL_CAPSULE | Freq: Every day | ORAL | 0 refills | Status: DC

## 2019-09-17 NOTE — Telephone Encounter (Signed)
I ordered extended release Adderall 25 mg - it should be 1/3 the cost and maybe is better "covered" by insurance.

## 2019-09-17 NOTE — Telephone Encounter (Signed)
Patients Vyvanse is covered by his insurance, patient has a high deductible and that is why it is so expensive. I applied a copay card and it was still 214 dollars. Is there an affordable comparable medication? Please review and advise, thank you

## 2019-09-17 NOTE — Telephone Encounter (Signed)
Called patient and let him know.

## 2019-09-22 ENCOUNTER — Other Ambulatory Visit (HOSPITAL_COMMUNITY): Payer: Self-pay | Admitting: Psychiatry

## 2019-09-29 ENCOUNTER — Ambulatory Visit (INDEPENDENT_AMBULATORY_CARE_PROVIDER_SITE_OTHER): Payer: BC Managed Care – PPO | Admitting: Licensed Clinical Social Worker

## 2019-09-29 ENCOUNTER — Other Ambulatory Visit: Payer: Self-pay

## 2019-09-29 DIAGNOSIS — F331 Major depressive disorder, recurrent, moderate: Secondary | ICD-10-CM

## 2019-09-30 ENCOUNTER — Encounter (HOSPITAL_COMMUNITY): Payer: Self-pay | Admitting: Licensed Clinical Social Worker

## 2019-09-30 NOTE — Progress Notes (Signed)
Virtual Visit via Video Note  I connected with Cory Gomez on 09/30/19 at  4:00 PM EDT by a video enabled telemedicine application and verified that I am speaking with the correct person using two identifiers.  Location: Patient: Work Provider: Office   I discussed the limitations of evaluation and management by telemedicine and the availability of in person appointments. The patient expressed understanding and agreed to proceed.  THERAPIST PROGRESS NOTE  Session Time: 4:00 pm-4:45 pm  Participation Level: Active  Behavioral Response: CasualAlertDepressed  Type of Therapy: Individual Therapy  Treatment Goals addressed: Coping  Interventions: CBT and Solution Focused  Summary: Cory Gomez is a 38 y.o. male who presents oriented x5 (person, place, situation, time and object), casually dressed, appropriately groomed, average height, overweight, and cooperative to address mood. Patient has a history of medical treatment including prediabetes and obesity. Patient has a minimal history of mental health treatment including medication management and outpatient therapy. Patient admits to thoughts of SI but denies a plan and denies homicidal ideations. Patient denies psychosis including auditory and visual hallucinations. Patient denies substance abuse. Patient is at low risk for lethality at this time.  Session # 3  Physically: Patient continues to feel tired. He feels like he doesn't have enough energy to do anything after work.   Spiritually/values: No issues identified.  Relationships: Patient continues to have a strained relationship with his wife. Patient stated that his wife has got a job as he requested but feels like she won't maintain it. Emotionally/Mentally/Behavior:  Patient's mood has been depressed and stressed. Patient continues to feel financially stressed which impacts all aspects of his life.   Patient engaged in session. He responded well to interventions.  Patient continues to meet criteria for Major depressive disorder, recurrent, moderate. Patient will continue in outpatient therapy due to being the least restrictive service to meet his needs at this time. Patient made minimal progress on his goals.    Suicidal/Homicidal: Negativewithout intent/plan  Therapist Response: Therapist reviewed patient's recent thoughts and behaviors. Therapist utilized CBT to address mood. Therapist processed patient's triggers for depression. Therapist assisted patient discussing what has gone well with his relationship.  Plan: Return again in 2- 4 weeks.   Diagnosis: Axis I: Major depressive disorder, recurrent, moderate    Axis II: No diagnosis   I discussed the assessment and treatment plan with the patient. The patient was provided an opportunity to ask questions and all were answered. The patient agreed with the plan and demonstrated an understanding of the instructions.   The patient was advised to call back or seek an in-person evaluation if the symptoms worsen or if the condition fails to improve as anticipated.  I provided 45 minutes of non-face-to-face time during this encounter.   Glori Bickers, LCSW 09/30/2019

## 2019-10-30 ENCOUNTER — Ambulatory Visit (INDEPENDENT_AMBULATORY_CARE_PROVIDER_SITE_OTHER): Payer: BC Managed Care – PPO | Admitting: Psychiatry

## 2019-10-30 ENCOUNTER — Other Ambulatory Visit: Payer: Self-pay

## 2019-10-30 DIAGNOSIS — F411 Generalized anxiety disorder: Secondary | ICD-10-CM | POA: Diagnosis not present

## 2019-10-30 DIAGNOSIS — F331 Major depressive disorder, recurrent, moderate: Secondary | ICD-10-CM

## 2019-10-30 DIAGNOSIS — F900 Attention-deficit hyperactivity disorder, predominantly inattentive type: Secondary | ICD-10-CM

## 2019-10-30 MED ORDER — SERTRALINE HCL 100 MG PO TABS: 200.0000 mg | ORAL_TABLET | Freq: Every day | ORAL | 1 refills | Status: DC

## 2019-10-30 MED ORDER — ZOLPIDEM TARTRATE ER 12.5 MG PO TBCR: 12.5000 mg | EXTENDED_RELEASE_TABLET | Freq: Every evening | ORAL | 1 refills | Status: DC | PRN

## 2019-10-30 MED ORDER — AMPHETAMINE-DEXTROAMPHET ER 30 MG PO CP24: 30.0000 mg | ORAL_CAPSULE | Freq: Every day | ORAL | 0 refills | Status: DC

## 2019-10-30 NOTE — Progress Notes (Signed)
BH MD/PA/NP OP Progress Note  10/30/2019 9:13 AM Cory Gomez  MRN:  [id number] Interview was conducted by phone and I verified that I was speaking with the correct person using two identifiers. I discussed the limitations of evaluation and management by telemedicine and  the availability of in person appointments. Patient expressed understanding and agreed to proceed.  Chief Complaint: Depression,anxiety.  HPI: 38 year old married male who presents with chronic depression which worsened over past 4 months.In addition to depression/anxiety he also admits to binge eating - feels that when stressed out he overeats. He reports marital conflict and difficulty with communication being likely a trigger for current depression. He has been married just about a year after knowing his wife for just 3 months prior. He was previously married for 2.5 years but after he found his ex wife cheating on him he got divorced. He remarried within 7 months. 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 At home he feels unappreciated for all the effort he puts into household chores. He has been more stressed out lately and depression intensified to the point of him having passive SI. Financial stressors dominate at this time. His wife resumed work but they are still financially stressed. He denies feeling suicidal but depressed mood improved only marginally after last dose of sertraline increase. Sleep improved and he is only sporadically needing to take  Ambien CR. While in school he was diagnosed with having ADHD inattentive type and was on Ritalin. We have started Adderall XR 25 mg (Vyvanse is prohibitively priced for him) and he noticed some improvement. Zoloft was added for depression/anxiety - he tolerates it well - we increased dose to 150 mg a month ago. He has seen Glori Bickers for counseling appointment a month ago and is waiting for follow up/next appointment to be scheduled.  Visit Diagnosis:    ICD-10-CM    1. Major depressive disorder, recurrent episode, moderate (HCC)  F33.1   2. GAD (generalized anxiety disorder)  F41.1   3. Attention deficit hyperactivity disorder (ADHD), predominantly inattentive type  F90.0     Past Psychiatric History: Please see intake H&P.  Past Medical History:  Past Medical History:  Diagnosis Date  . ADHD (attention deficit hyperactivity disorder)   . Anxiety and depression   . Insomnia   . Obesity (BMI 30-39.9)   . Prediabetes     Past Surgical History:  Procedure Laterality Date  . NO PAST SURGERIES      Family Psychiatric History: Reviewed.  Family History:  Family History  Problem Relation Age of Onset  . Hypertension Mother   . Hyperlipidemia Mother   . Diabetes Mellitus II Mother   . Anxiety disorder Mother   . Depression Mother   . Anxiety disorder Brother   . Depression Brother   . Hypertension Brother   . Hyperlipidemia Brother   . Diabetes type II Brother   . ADD / ADHD Brother     Social History:  Social History   Socioeconomic History  . Marital status: Married    Spouse name: Not on file  . Number of children: Not on file  . Years of education: Not on file  . Highest education level: Not on file  Occupational History  . Not on file  Social Needs  . Financial resource strain: Not on file  . Food insecurity    Worry: Not on file    Inability: Not on file  . Transportation needs  Medical: Not on file    Non-medical: Not on file  Tobacco Use  . Smoking status: Never Smoker  . Smokeless tobacco: Never Used  Substance and Sexual Activity  . Alcohol use: Not Currently  . Drug use: Not Currently  . Sexual activity: Yes    Partners: Female  Lifestyle  . Physical activity    Days per week: Not on file    Minutes per session: Not on file  . Stress: Not on file  Relationships  . Social Musician on phone: Not on file    Gets together: Not on file    Attends religious service: Not on file    Active  member of club or organization: Not on file    Attends meetings of clubs or organizations: Not on file    Relationship status: Not on file  Other Topics Concern  . Not on file  Social History Narrative   Married no kids wife is [name]    -wife and mother [name] on [place]      Has siblings    Works Educational psychologist IT     Allergies: No Known Allergies  Metabolic Disorder Labs: Lab Results  Component Value Date   HGBA1C 6.4 06/11/2019   No results found for: PROLACTIN Lab Results  Component Value Date   CHOL 175 06/11/2019   TRIG 200.0 (H) 06/11/2019   HDL 33.20 (L) 06/11/2019   CHOLHDL 5 06/11/2019   VLDL 40.0 06/11/2019   LDLCALC 102 (H) 06/11/2019   Lab Results  Component Value Date   TSH 3.38 06/11/2019    Therapeutic Level Labs: No results found for: LITHIUM No results found for: VALPROATE No components found for:  CBMZ  Current Medications: Current Outpatient Medications  Medication Sig Dispense Refill  . amphetamine-dextroamphetamine (ADDERALL XR) 30 MG 24 hr capsule Take 1 capsule (30 mg total) by mouth daily. 30 capsule 0  . Benzoyl Peroxide 10 % LIQD Apply 1 Bottle topically daily as needed. Acne Be careful may bleach clothing 160 g 11  . carbamide peroxide (DEBROX) 6.5 % OTIC solution Place 5 drops into the left ear 2 (two) times daily. X 4-7 days left ear 30 mL 0  . sertraline (ZOLOFT) 100 MG tablet Take 2 tablets (200 mg total) by mouth daily. 60 tablet 1  . zolpidem (AMBIEN CR) 12.5 MG CR tablet Take 1 tablet (12.5 mg total) by mouth at bedtime as needed for sleep. 30 tablet 1   No current facility-administered medications for this visit.       Psychiatric Specialty Exam: Review of Systems  Psychiatric/Behavioral: Positive for depression. The patient is nervous/anxious.   All other systems reviewed and are negative.   There were no vitals taken for this visit.There is no height or weight on file to calculate BMI.  General Appearance: NA  Eye  Contact:  NA  Speech:  Clear and Coherent and Normal Rate  Volume:  Normal  Mood:  Anxious and Depressed  Affect:  NA  Thought Process:  Goal Directed  Orientation:  Full (Time, Place, and Person)  Thought Content: Logical   Suicidal Thoughts:  No  Homicidal Thoughts:  No  Memory:  Immediate;   Good Recent;   Good Remote;   Good  Judgement:  Good  Insight:  Fair  Psychomotor Activity:  NA  Concentration:  Concentration: Fair  Recall:  Good  Fund of Knowledge: Good  Language: Good  Akathisia:  Negative  Handed:  Right  AIMS (if indicated): not done  Assets:  Communication Skills Desire for Improvement Financial Resources/Insurance Housing Physical Health Talents/Skills  ADL's:  Intact  Cognition: WNL  Sleep:  Fair   Screenings: GAD-7     [facility] [place]  Total GAD-7 Score  20    PHQ2-9     [facility] [place] [facility] [place]  PHQ-2 Total Score  6  6  PHQ-9 Total Score  26  19       Assessment and Plan: 38 year old married male who presents with chronic depression which worsened over past 4 months.In addition to depression/anxiety he also admits to binge eating - feels that when stressed out he overeats. He reports marital conflict and difficulty with communication being likely a trigger for current depression. He has been married just about a year after knowing his wife for just 3 months prior. He was previously married for 2.5 years but after he found his ex wife cheating on him he got divorced. He remarried within 7 months. 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 At home he feels unappreciated for all the effort he puts into household chores. He has been more stressed out lately and depression intensified to the point of him having passive SI. Financial stressors dominate at this time. His wife resumed work but they are still  financially stressed. He denies feeling suicidal but depressed mood improved only marginally after last dose of sertraline increase. Sleep improved and he is only sporadically needing to take  Ambien CR. While in school he was diagnosed with having ADHD inattentive type and was on Ritalin. We have started Adderall XR 25 mg (Vyvanse is prohibitively priced for him) and he noticed some improvement. Zoloft was added for depression/anxiety - he tolerates it well - we increased dose to 150 mg a month ago. He has seen [facility]um Bellow[facility]ua Sheets for counseling appointment a month ago and is waiting for follow up/next appointment to be scheduled.  Dx: Major depressive disorder recurrent moderate; GAD; ADD; Binge eating disorder  Plan: Increasesertraline to 200 mg, continue zolpidem CR and increase Adderall XR to 30 mg. Next appointment with me in5weeks.The plan was discussed with patient who had an opportunity to ask questions and these were all answered. I spend725minutes inphone consultationwith the patient    Magdalene Patricialgierd A Kyrese Gartman, MD 10/30/2019, 9:13 AM

## 2019-12-08 ENCOUNTER — Ambulatory Visit (INDEPENDENT_AMBULATORY_CARE_PROVIDER_SITE_OTHER): Payer: Self-pay | Admitting: Psychiatry

## 2019-12-08 ENCOUNTER — Other Ambulatory Visit: Payer: Self-pay

## 2019-12-08 DIAGNOSIS — F900 Attention-deficit hyperactivity disorder, predominantly inattentive type: Secondary | ICD-10-CM

## 2019-12-08 DIAGNOSIS — F331 Major depressive disorder, recurrent, moderate: Secondary | ICD-10-CM

## 2019-12-08 MED ORDER — AMPHETAMINE-DEXTROAMPHET ER 30 MG PO CP24: 30.0000 mg | ORAL_CAPSULE | Freq: Every day | ORAL | 0 refills | Status: DC

## 2019-12-08 MED ORDER — ZOLPIDEM TARTRATE ER 12.5 MG PO TBCR: 12.5000 mg | EXTENDED_RELEASE_TABLET | Freq: Every evening | ORAL | 1 refills | Status: DC | PRN

## 2019-12-08 MED ORDER — SERTRALINE HCL 100 MG PO TABS: 200.0000 mg | ORAL_TABLET | Freq: Every day | ORAL | 1 refills | Status: DC

## 2019-12-08 NOTE — Progress Notes (Signed)
BH MD/PA/NP OP Progress Note  12/08/2019 4:10 PM [name]  MRN:  [id number] Interview was conducted by phone and I verified that I was speaking with the correct person using two identifiers. I discussed the limitations of evaluation and management by telemedicine and  the availability of in person appointments. Patient expressed understanding and agreed to proceed.  Chief Complaint: Stress, anxiety, depression.  HPI: 38 year old married male who presents with chronic depression which worsened over past 4 months.In addition to depression/anxiety he also admits to binge eating - feels that when stressed out he overeats. He reports marital conflict and difficulty with communication being likely a trigger for current depression. He has been married just about a year after knowing his wife for just 3 months prior. He was previously married for 2.5 years but after he found his ex wife cheating on him he got divorced. Heremarried within 7 months. 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 At home he feels unappreciated for all the effort he puts into household chores. He has been more stressed out lately and depression intensified to the point of him having passive SI. Financial stressors dominate at this time. His wife resumed work but they are still financially stressed. He denies feeling suicidal but depressed mood improved only marginally after last dose of sertraline increase. Sleep improved and he is only sporadically needing to take  Ambien CR. While in school he was diagnosed with having ADHD inattentive type and was on Ritalin. We have started Adderall XR 25 mg (Vyvanse is prohibitively priced for him) and he noticed some improvement. Zoloft was added for depression/anxiety - he tolerates it well- we increased dose gradually to 200 mg. He noticed some improvement in mood. He has seen Glori Bickers for counseling appointment two months ago and is still waiting for follow up/next appointment  to be scheduled.  Visit Diagnosis:    ICD-10-CM   1. Major depressive disorder, recurrent episode, moderate (HCC)  F33.1   2. Attention deficit hyperactivity disorder (ADHD), predominantly inattentive type  F90.0     Past Psychiatric History: Please see intake H&P.  Past Medical History:  Past Medical History:  Diagnosis Date  . ADHD (attention deficit hyperactivity disorder)   . Anxiety and depression   . Insomnia   . Obesity (BMI 30-39.9)   . Prediabetes     Past Surgical History:  Procedure Laterality Date  . NO PAST SURGERIES      Family Psychiatric History: Reviewed.  Family History:  Family History  Problem Relation Age of Onset  . Hypertension Mother   . Hyperlipidemia Mother   . Diabetes Mellitus II Mother   . Anxiety disorder Mother   . Depression Mother   . Anxiety disorder Brother   . Depression Brother   . Hypertension Brother   . Hyperlipidemia Brother   . Diabetes type II Brother   . ADD / ADHD Brother     Social History:  Social History   Socioeconomic History  . Marital status: Married    Spouse name: Not on file  . Number of children: Not on file  . Years of education: Not on file  . Highest education level: Not on file  Occupational History  . Not on file  Tobacco Use  . Smoking status: Never Smoker  . Smokeless tobacco: Never Used  Substance and Sexual Activity  . Alcohol use: Not Currently  . Drug use: Not Currently  . Sexual activity: Yes  Partners: Female  Other Topics Concern  . Not on file  Social History Narrative   Married no kids wife is Cory Gomez    -[name] on [place]      Has siblings    Works Educational psychologistComp Tech IT    Social Determinants of Corporate investment bankerHealth   Financial Resource Strain:   . Difficulty of Paying Living Expenses: Not on file  Food Insecurity:   . Worried About Programme researcher, broadcasting/film/videounning Out of Food in the Last Year: Not on file  . Ran Out of Food in the Last Year: Not on file  Transportation Needs:   . Lack of  Transportation (Medical): Not on file  . Lack of Transportation (Non-Medical): Not on file  Physical Activity:   . Days of Exercise per Week: Not on file  . Minutes of Exercise per Session: Not on file  Stress:   . Feeling of Stress : Not on file  Social Connections:   . Frequency of Communication with Friends and Family: Not on file  . Frequency of Social Gatherings with Friends and Family: Not on file  . Attends Religious Services: Not on file  . Active Member of Clubs or Organizations: Not on file  . Attends BankerClub or Organization Meetings: Not on file  . Marital Status: Not on file    Allergies: No Known Allergies  Metabolic Disorder Labs: Lab Results  Component Value Date   HGBA1C 6.4 06/11/2019   No results found for: PROLACTIN Lab Results  Component Value Date   CHOL 175 06/11/2019   TRIG 200.0 (H) 06/11/2019   HDL 33.20 (L) 06/11/2019   CHOLHDL 5 06/11/2019   VLDL 40.0 06/11/2019   LDLCALC 102 (H) 06/11/2019   Lab Results  Component Value Date   TSH 3.38 06/11/2019    Therapeutic Level Labs: No results found for: LITHIUM No results found for: VALPROATE No components found for:  CBMZ  Current Medications: Current Outpatient Medications  Medication Sig Dispense Refill  . amphetamine-dextroamphetamine (ADDERALL XR) 30 MG 24 hr capsule Take 1 capsule (30 mg total) by mouth daily. 30 capsule 0  . Benzoyl Peroxide 10 % LIQD Apply 1 Bottle topically daily as needed. Acne Be careful may bleach clothing 160 g 11  . carbamide peroxide (DEBROX) 6.5 % OTIC solution Place 5 drops into the left ear 2 (two) times daily. X 4-7 days left ear 30 mL 0  . sertraline (ZOLOFT) 100 MG tablet Take 2 tablets (200 mg total) by mouth daily. 60 tablet 1  . zolpidem (AMBIEN CR) 12.5 MG CR tablet Take 1 tablet (12.5 mg total) by mouth at bedtime as needed for sleep. 30 tablet 1   No current facility-administered medications for this visit.     Psychiatric Specialty Exam: Review of  Systems  Psychiatric/Behavioral: The patient is nervous/anxious.   All other systems reviewed and are negative.   There were no vitals taken for this visit.There is no height or weight on file to calculate BMI.  General Appearance: NA  Eye Contact:  NA  Speech:  Clear and Coherent and Normal Rate  Volume:  Normal  Mood:  Anxious and Depressed  Affect:  NA  Thought Process:  Goal Directed and Linear  Orientation:  Full (Time, Place, and Person)  Thought Content: Logical   Suicidal Thoughts:  No  Homicidal Thoughts:  No  Memory:  Immediate;   Good Recent;   Good Remote;   Good  Judgement:  Good  Insight:  Fair  Psychomotor Activity:  NA  Concentration:  Concentration: Good  Recall:  Good  Fund of Knowledge: Good  Language: Good  Akathisia:  Negative  Handed:  Right  AIMS (if indicated): not done  Assets:  Communication Skills Desire for Improvement Housing Physical Health Talents/Skills Transportation  ADL's:  Intact  Cognition: WNL  Sleep:  Fair   Screenings: GAD-7     Office Visit from 09/08/2019 in [facility]  Total GAD-7 Score  20    PHQ2-9     Office Visit from 09/08/2019 in [facility] Office Visit from 06/02/2019 in [facility]  PHQ-2 Total Score  6  6  PHQ-9 Total Score  26  19       Assessment and Plan: 38 year old married male who presents with chronic depression which worsened over past 4 months.In addition to depression/anxiety he also admits to binge eating - feels that when stressed out he overeats. He reports marital conflict and difficulty with communication being likely a trigger for current depression. He has been married just about a year after knowing his wife for just 3 months prior. He was previously married for 2.5 years but after he found his ex wife cheating on him he got divorced. Heremarried within 7 months. 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 At home he feels unappreciated  for all the effort he puts into household chores. He has been more stressed out lately and depression intensified to the point of him having passive SI. Financial stressors dominate at this time. His wife resumed work but they are still financially stressed. He denies feeling suicidal but depressed mood improved only marginally after last dose of sertraline increase. Sleep improved and he is only sporadically needing to take  Ambien CR. While in school he was diagnosed with having ADHD inattentive type and was on Ritalin. We have started Adderall XR 25 mg (Vyvanse is prohibitively priced for him) and he noticed some improvement. Zoloft was added for depression/anxiety - he tolerates it well- we increased dose gradually to 200 mg. He noticed some improvement in mood. He has seen Bynum Bellows for counseling appointment two months ago and is still waiting for follow up/next appointment to be scheduled.  Dx: Major depressive disorder recurrent moderate; GAD; ADD; Binge eating disorder  Plan: Continue sertraline to 200 mg,zolpidem CR and Adderall XR to 30 mg.Next appointment with me in4weeks.The plan was discussed with patient who had an opportunity to ask questions and these were all answered. I spend40minutes inphone consultationwith the patient   Magdalene Patricia, MD 12/08/2019, 4:10 PM

## 2019-12-29 ENCOUNTER — Encounter: Payer: Self-pay | Admitting: Internal Medicine

## 2020-01-07 ENCOUNTER — Ambulatory Visit (INDEPENDENT_AMBULATORY_CARE_PROVIDER_SITE_OTHER): Payer: BC Managed Care – PPO | Admitting: Psychiatry

## 2020-01-07 ENCOUNTER — Other Ambulatory Visit: Payer: Self-pay

## 2020-01-07 DIAGNOSIS — F988 Other specified behavioral and emotional disorders with onset usually occurring in childhood and adolescence: Secondary | ICD-10-CM

## 2020-01-07 DIAGNOSIS — F411 Generalized anxiety disorder: Secondary | ICD-10-CM | POA: Diagnosis not present

## 2020-01-07 DIAGNOSIS — F5081 Binge eating disorder: Secondary | ICD-10-CM

## 2020-01-07 DIAGNOSIS — F330 Major depressive disorder, recurrent, mild: Secondary | ICD-10-CM

## 2020-01-07 DIAGNOSIS — F900 Attention-deficit hyperactivity disorder, predominantly inattentive type: Secondary | ICD-10-CM

## 2020-01-07 MED ORDER — ZOLPIDEM TARTRATE ER 12.5 MG PO TBCR: 12.5000 mg | EXTENDED_RELEASE_TABLET | Freq: Every evening | ORAL | 2 refills | Status: DC | PRN

## 2020-01-07 MED ORDER — SERTRALINE HCL 100 MG PO TABS: 200.0000 mg | ORAL_TABLET | Freq: Every day | ORAL | 2 refills | Status: DC

## 2020-01-07 MED ORDER — AMPHETAMINE-DEXTROAMPHET ER 30 MG PO CP24: 30.0000 mg | ORAL_CAPSULE | Freq: Every day | ORAL | 0 refills | Status: DC

## 2020-01-07 NOTE — Progress Notes (Signed)
BH MD/PA/NP OP Progress Note  01/07/2020 4:12 PM Cory Gomez  MRN:  [id number] Interview was conducted by phone and I verified that I was speaking with the correct person using two identifiers. I discussed the limitations of evaluation and management by telemedicine and  the availability of in person appointments. Patient expressed understanding and agreed to proceed.  Chief Complaint: Anxiety.  HPI: 39year old married male who presents with chronic depression which worsened over past 4 months.In addition to depression/anxiety he also admits to binge eating - feels that when stressed out he overeats. He reports marital conflict and difficulty with communication being likely a trigger for current depression. He has been married just about a year after knowing his wife for just 3 months prior. He was previously married for 2.5 years but after he found his ex wife cheating on him he got divorced. Heremarried within 7 months. 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 At home he feels unappreciated for all the effort he puts into household chores. Financial stressors dominate at this time. His wife resumed workbut they both caught COVID and whereas he fully recovered and is back at works she is not. Sleep improved and he is only sporadically needing to takeAmbien CR. While in school he was diagnosed with having ADHD inattentive type and was on Ritalin.We have started Adderall XR 25 mg (Vyvanse is prohibitively priced for him) and he noticed some improvement.Zoloft was added for depression/anxiety - he tolerates it well- we increased dose gradually to 200 mg. He noticed improvement in mood.   Visit Diagnosis:    ICD-10-CM   1. GAD (generalized anxiety disorder)  F41.1   2. Attention deficit hyperactivity disorder (ADHD), predominantly inattentive type  F90.0     Past Psychiatric History: Please see intake H&P.  Past Medical History:  Past Medical History:  Diagnosis Date  . ADHD  (attention deficit hyperactivity disorder)   . Anxiety and depression   . Insomnia   . Obesity (BMI 30-39.9)   . Prediabetes     Past Surgical History:  Procedure Laterality Date  . NO PAST SURGERIES      Family Psychiatric History: Reviewed.  Family History:  Family History  Problem Relation Age of Onset  . Hypertension Mother   . Hyperlipidemia Mother   . Diabetes Mellitus II Mother   . Anxiety disorder Mother   . Depression Mother   . Anxiety disorder Brother   . Depression Brother   . Hypertension Brother   . Hyperlipidemia Brother   . Diabetes type II Brother   . ADD / ADHD Brother     Social History:  Social History   Socioeconomic History  . Marital status: Married    Spouse name: Not on file  . Number of children: Not on file  . Years of education: Not on file  . Highest education level: Not on file  Occupational History  . Not on file  Tobacco Use  . Smoking status: Never Smoker  . Smokeless tobacco: Never Used  Substance and Sexual Activity  . Alcohol use: Not Currently  . Drug use: Not Currently  . Sexual activity: Yes    Partners: Female  Other Topics Concern  . Not on file  Social History Narrative   Married no kids wife is Victoria Euceda    -wife and mother [name] on [place]      Has siblings    Works Educational psychologist IT    Social Determinants of  Health   Financial Resource Strain:   . Difficulty of Paying Living Expenses: Not on file  Food Insecurity:   . Worried About Charity fundraiser in the Last Year: Not on file  . Ran Out of Food in the Last Year: Not on file  Transportation Needs:   . Lack of Transportation (Medical): Not on file  . Lack of Transportation (Non-Medical): Not on file  Physical Activity:   . Days of Exercise per Week: Not on file  . Minutes of Exercise per Session: Not on file  Stress:   . Feeling of Stress : Not on file  Social Connections:   . Frequency of Communication with Friends and Family: Not on file  .  Frequency of Social Gatherings with Friends and Family: Not on file  . Attends Religious Services: Not on file  . Active Member of Clubs or Organizations: Not on file  . Attends Archivist Meetings: Not on file  . Marital Status: Not on file    Allergies: No Known Allergies  Metabolic Disorder Labs: Lab Results  Component Value Date   HGBA1C 6.4 06/11/2019   No results found for: PROLACTIN Lab Results  Component Value Date   CHOL 175 06/11/2019   TRIG 200.0 (H) 06/11/2019   HDL 33.20 (L) 06/11/2019   CHOLHDL 5 06/11/2019   VLDL 40.0 06/11/2019   LDLCALC 102 (H) 06/11/2019   Lab Results  Component Value Date   TSH 3.38 06/11/2019    Therapeutic Level Labs: No results found for: LITHIUM No results found for: VALPROATE No components found for:  CBMZ  Current Medications: Current Outpatient Medications  Medication Sig Dispense Refill  . amphetamine-dextroamphetamine (ADDERALL XR) 30 MG 24 hr capsule Take 1 capsule (30 mg total) by mouth daily. 30 capsule 0  . Benzoyl Peroxide 10 % LIQD Apply 1 Bottle topically daily as needed. Acne Be careful may bleach clothing 160 g 11  . carbamide peroxide (DEBROX) 6.5 % OTIC solution Place 5 drops into the left ear 2 (two) times daily. X 4-7 days left ear 30 mL 0  . [START ON 02/05/2020] sertraline (ZOLOFT) 100 MG tablet Take 2 tablets (200 mg total) by mouth daily. 60 tablet 2  . [START ON 02/05/2020] zolpidem (AMBIEN CR) 12.5 MG CR tablet Take 1 tablet (12.5 mg total) by mouth at bedtime as needed for sleep. 30 tablet 2   No current facility-administered medications for this visit.     Psychiatric Specialty Exam: Review of Systems  Psychiatric/Behavioral: Positive for sleep disturbance. The patient is nervous/anxious.   All other systems reviewed and are negative.   There were no vitals taken for this visit.There is no height or weight on file to calculate BMI.  General Appearance: NA  Eye Contact:  NA  Speech:  Clear  and Coherent and Normal Rate  Volume:  Normal  Mood:  Anxious  Affect:  NA  Thought Process:  Goal Directed and Linear  Orientation:  Full (Time, Place, and Person)  Thought Content: Logical   Suicidal Thoughts:  No  Homicidal Thoughts:  No  Memory:  Immediate;   Good Recent;   Good Remote;   Good  Judgement:  Good  Insight:  Good  Psychomotor Activity:  NA  Concentration:  Concentration: Good  Recall:  Good  Fund of Knowledge: Good  Language: Good  Akathisia:  Negative  Handed:  Right  AIMS (if indicated): not done  Assets:  Communication Skills Desire for Improvement Financial Resources/Insurance  Housing Resilience Talents/Skills  ADL's:  Intact  Cognition: WNL  Sleep:  Fair   Screenings: GAD-7     Office Visit from 09/08/2019 in [facility]  Total GAD-7 Score  20    PHQ2-9     Office Visit from 09/08/2019 in [facility] Office Visit from 06/02/2019 in [facility]  PHQ-2 Total Score  6  6  PHQ-9 Total Score  26  19       Assessment and Plan: 39year old married male who presents with chronic depression which worsened over past 4 months.In addition to depression/anxiety he also admits to binge eating - feels that when stressed out he overeats. He reports marital conflict and difficulty with communication being likely a trigger for current depression. He has been married just about a year after knowing his wife for just 3 months prior. He was previously married for 2.5 years but after he found his ex wife cheating on him he got divorced. Heremarried within 7 months. 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 At home he feels unappreciated for all the effort he puts into household chores. Financial stressors dominate at this time. His wife resumed workbut they both caught COVID and whereas he fully recovered and is back at works she is not. Sleep improved and he is only sporadically needing to takeAmbien CR.  While in school he was diagnosed with having ADHD inattentive type and was on Ritalin.We have started Adderall XR 25 mg (Vyvanse is prohibitively priced for him) and he noticed some improvement.Zoloft was added for depression/anxiety - he tolerates it well- we increased dose gradually to 200 mg. He noticed improvement in mood.   Dx: Major depressive disorder recurrent mild; GAD; ADD; Binge eating disorder  Plan: Continue sertraline to200mg ,zolpidem CRand Adderall XR 30 mg.Next appointment with me in4weeks.The plan was discussed with patient who had an opportunity to ask questions and these were all answered. I spend46minutes inphone consultationwith the patient    Magdalene Patricia, MD 01/07/2020, 4:12 PM

## 2020-01-12 ENCOUNTER — Ambulatory Visit: Payer: BC Managed Care – PPO | Admitting: Internal Medicine

## 2020-01-13 ENCOUNTER — Encounter: Payer: Self-pay | Admitting: Internal Medicine

## 2020-01-13 ENCOUNTER — Ambulatory Visit (INDEPENDENT_AMBULATORY_CARE_PROVIDER_SITE_OTHER): Payer: BC Managed Care – PPO | Admitting: Internal Medicine

## 2020-01-13 ENCOUNTER — Other Ambulatory Visit: Payer: Self-pay

## 2020-01-13 VITALS — Ht 74.0 in | Wt 300.0 lb

## 2020-01-13 DIAGNOSIS — G4700 Insomnia, unspecified: Secondary | ICD-10-CM

## 2020-01-13 DIAGNOSIS — Z0000 Encounter for general adult medical examination without abnormal findings: Secondary | ICD-10-CM

## 2020-01-13 DIAGNOSIS — R7303 Prediabetes: Secondary | ICD-10-CM

## 2020-01-13 DIAGNOSIS — F411 Generalized anxiety disorder: Secondary | ICD-10-CM

## 2020-01-13 DIAGNOSIS — E559 Vitamin D deficiency, unspecified: Secondary | ICD-10-CM

## 2020-01-13 DIAGNOSIS — R748 Abnormal levels of other serum enzymes: Secondary | ICD-10-CM

## 2020-01-13 DIAGNOSIS — Z1329 Encounter for screening for other suspected endocrine disorder: Secondary | ICD-10-CM

## 2020-01-13 DIAGNOSIS — E669 Obesity, unspecified: Secondary | ICD-10-CM

## 2020-01-13 DIAGNOSIS — Z1389 Encounter for screening for other disorder: Secondary | ICD-10-CM

## 2020-01-13 DIAGNOSIS — F330 Major depressive disorder, recurrent, mild: Secondary | ICD-10-CM

## 2020-01-13 DIAGNOSIS — E785 Hyperlipidemia, unspecified: Secondary | ICD-10-CM

## 2020-01-13 NOTE — Patient Instructions (Addendum)
Recommend vitamin D3 4000 IU daily over the counter   High Cholesterol  High cholesterol is a condition in which the blood has high levels of a white, waxy, fat-like substance (cholesterol). The human body needs small amounts of cholesterol. The liver makes all the cholesterol that the body needs. Extra (excess) cholesterol comes from the food that we eat. Cholesterol is carried from the liver by the blood through the blood vessels. If you have high cholesterol, deposits (plaques) may build up on the walls of your blood vessels (arteries). Plaques make the arteries narrower and stiffer. Cholesterol plaques increase your risk for heart attack and stroke. Work with your health care provider to keep your cholesterol levels in a healthy range. What increases the risk? This condition is more likely to develop in people who:  Eat foods that are high in animal fat (saturated fat) or cholesterol.  Are overweight.  Are not getting enough exercise.  Have a family history of high cholesterol. What are the signs or symptoms? There are no symptoms of this condition. How is this diagnosed? This condition may be diagnosed from the results of a blood test.  If you are older than age 73, your health care provider may check your cholesterol every 4-6 years.  You may be checked more often if you already have high cholesterol or other risk factors for heart disease. The blood test for cholesterol measures:  "Bad" cholesterol (LDL cholesterol). This is the main type of cholesterol that causes heart disease. The desired level for LDL is less than 100.  "Good" cholesterol (HDL cholesterol). This type helps to protect against heart disease by cleaning the arteries and carrying the LDL away. The desired level for HDL is 60 or higher.  Triglycerides. These are fats that the body can store or burn for energy. The desired number for triglycerides is lower than 150.  Total cholesterol. This is a measure of the  total amount of cholesterol in your blood, including LDL cholesterol, HDL cholesterol, and triglycerides. A healthy number is less than 200. How is this treated? This condition is treated with diet changes, lifestyle changes, and medicines. Diet changes  This may include eating more whole grains, fruits, vegetables, nuts, and fish.  This may also include cutting back on red meat and foods that have a lot of added sugar. Lifestyle changes  Changes may include getting at least 40 minutes of aerobic exercise 3 times a week. Aerobic exercises include walking, biking, and swimming. Aerobic exercise along with a healthy diet can help you maintain a healthy weight.  Changes may also include quitting smoking. Medicines  Medicines are usually given if diet and lifestyle changes have failed to reduce your cholesterol to healthy levels.  Your health care provider may prescribe a statin medicine. Statin medicines have been shown to reduce cholesterol, which can reduce the risk of heart disease. Follow these instructions at home: Eating and drinking If told by your health care provider:  Eat chicken (without skin), fish, veal, shellfish, ground [place] breast, and round or loin cuts of red meat.  Do not eat fried foods or fatty meats, such as hot dogs and salami.  Eat plenty of fruits, such as apples.  Eat plenty of vegetables, such as broccoli, potatoes, and carrots.  Eat beans, peas, and lentils.  Eat grains such as barley, rice, couscous, and bulgur wheat.  Eat pasta without cream sauces.  Use skim or nonfat milk, and eat low-fat or nonfat yogurt and cheeses.  Do  not eat or drink whole milk, cream, ice cream, egg yolks, or hard cheeses.  Do not eat stick margarine or tub margarines that contain trans fats (also called partially hydrogenated oils).  Do not eat saturated tropical oils, such as coconut oil and palm oil.  Do not eat cakes, cookies, crackers, or other baked goods that  contain trans fats.  General instructions  Exercise as directed by your health care provider. Increase your activity level with activities such as gardening, walking, and taking the stairs.  Take over-the-counter and prescription medicines only as told by your health care provider.  Do not use any products that contain nicotine or tobacco, such as cigarettes and e-cigarettes. If you need help quitting, ask your health care provider.  Keep all follow-up visits as told by your health care provider. This is important. Contact a health care provider if:  You are struggling to maintain a healthy diet or weight.  You need help to start on an exercise program.  You need help to stop smoking. Get help right away if:  You have chest pain.  You have trouble breathing. This information is not intended to replace advice given to you by your health care provider. Make sure you discuss any questions you have with your health care provider. Document Revised: 12/13/2017 Document Reviewed: 06/09/2016 Elsevier Patient Education  [place].  Cholesterol Content in Foods Cholesterol is a waxy, fat-like substance that helps to carry fat in the blood. The body needs cholesterol in small amounts, but too much cholesterol can cause damage to the arteries and heart. Most people should eat less than 200 milligrams (mg) of cholesterol a day. Foods with cholesterol  Cholesterol is found in animal-based foods, such as meat, seafood, and dairy. Generally, low-fat dairy and lean meats have less cholesterol than full-fat dairy and fatty meats. The milligrams of cholesterol per serving (mg per serving) of common cholesterol-containing foods are listed below. Meat and other proteins  Egg -- one large whole egg has 186 mg.  Veal shank -- 4 oz has 141 mg.  Lean ground [place] (93% lean) -- 4 oz has 118 mg.  Fat-trimmed lamb loin -- 4 oz has 106 mg.  Lean ground beef (90% lean) -- 4 oz has 100  mg.  Lobster -- 3.5 oz has 90 mg.  Pork loin chops -- 4 oz has 86 mg.  Canned salmon -- 3.5 oz has 83 mg.  Fat-trimmed beef top loin -- 4 oz has 78 mg.  Frankfurter -- 1 frank (3.5 oz) has 77 mg.  Crab -- 3.5 oz has 71 mg.  Roasted chicken without skin, white meat -- 4 oz has 66 mg.  Light bologna -- 2 oz has 45 mg.  Deli-cut [place] -- 2 oz has 31 mg.  Canned tuna -- 3.5 oz has 31 mg.  Berniece Salines -- 1 oz has 29 mg.  Oysters and mussels (raw) -- 3.5 oz has 25 mg.  Mackerel -- 1 oz has 22 mg.  Trout -- 1 oz has 20 mg.  Pork sausage -- 1 link (1 oz) has 17 mg.  Salmon -- 1 oz has 16 mg.  Tilapia -- 1 oz has 14 mg. Dairy  Soft-serve ice cream --  cup (4 oz) has 103 mg.  Whole-milk yogurt -- 1 cup (8 oz) has 29 mg.  Cheddar cheese -- 1 oz has 28 mg.  American cheese -- 1 oz has 28 mg.  Whole milk -- 1 cup (8 oz) has 23  mg.  2% milk -- 1 cup (8 oz) has 18 mg.  Cream cheese -- 1 tablespoon (Tbsp) has 15 mg.  Cottage cheese --  cup (4 oz) has 14 mg.  Low-fat (1%) milk -- 1 cup (8 oz) has 10 mg.  Sour cream -- 1 Tbsp has 8.5 mg.  Low-fat yogurt -- 1 cup (8 oz) has 8 mg.  Nonfat Greek yogurt -- 1 cup (8 oz) has 7 mg.  Half-and-half cream -- 1 Tbsp has 5 mg. Fats and oils  Cod liver oil -- 1 tablespoon (Tbsp) has 82 mg.  Butter -- 1 Tbsp has 15 mg.  Lard -- 1 Tbsp has 14 mg.  Bacon grease -- 1 Tbsp has 14 mg.  Mayonnaise -- 1 Tbsp has 5-10 mg.  Margarine -- 1 Tbsp has 3-10 mg. Exact amounts of cholesterol in these foods may vary depending on specific ingredients and brands. Foods without cholesterol Most plant-based foods do not have cholesterol unless you combine them with a food that has cholesterol. Foods without cholesterol include:  Grains and cereals.  Vegetables.  Fruits.  Vegetable oils, such as olive, canola, and sunflower oil.  Legumes, such as peas, beans, and lentils.  Nuts and seeds.  Egg whites. Summary  The body needs  cholesterol in small amounts, but too much cholesterol can cause damage to the arteries and heart.  Most people should eat less than 200 milligrams (mg) of cholesterol a day. This information is not intended to replace advice given to you by your health care provider. Make sure you discuss any questions you have with your health care provider. Document Revised: 11/22/2017 Document Reviewed: 08/06/2017 Elsevier Patient Education  2020 [employer] Inc.    Prediabetes Eating Plan Prediabetes is a condition that causes blood sugar (glucose) levels to be higher than normal. This increases the risk for developing diabetes. In order to prevent diabetes from developing, your health care provider may recommend a diet and other lifestyle changes to help you:  Control your blood glucose levels.  Improve your cholesterol levels.  Manage your blood pressure. Your health care provider may recommend working with a diet and nutrition specialist (dietitian) to make a meal plan that is best for you. What are tips for following this plan? Lifestyle  Set weight loss goals with the help of your health care team. It is recommended that most people with prediabetes lose 7% of their current body weight.  Exercise for at least 30 minutes at least 5 days a week.  Attend a support group or seek ongoing support from a mental health counselor.  Take over-the-counter and prescription medicines only as told by your health care provider. Reading food labels  Read food labels to check the amount of fat, salt (sodium), and sugar in prepackaged foods. Avoid foods that have: ? Saturated fats. ? Trans fats. ? Added sugars.  Avoid foods that have more than 300 milligrams (mg) of sodium per serving. Limit your daily sodium intake to less than 2,300 mg each day. Shopping  Avoid buying pre-made and processed foods. Cooking  Cook with olive oil. Do not use butter, lard, or ghee.  Bake, broil, grill, or boil foods.  Avoid frying. Meal planning   Work with your dietitian to develop an eating plan that is right for you. This may include: ? Tracking how many calories you take in. Use a food diary, notebook, or mobile application to track what you eat at each meal. ? Using the glycemic index (GI) to  plan your meals. The index tells you how quickly a food will raise your blood glucose. Choose low-GI foods. These foods take a longer time to raise blood glucose.  Consider following a Mediterranean diet. This diet includes: ? Several servings each day of fresh fruits and vegetables. ? Eating fish at least twice a week. ? Several servings each day of whole grains, beans, nuts, and seeds. ? Using olive oil instead of other fats. ? Moderate alcohol consumption. ? Eating small amounts of red meat and whole-fat dairy.  If you have high blood pressure, you may need to limit your sodium intake or follow a diet such as the DASH eating plan. DASH is an eating plan that aims to lower high blood pressure. What foods are recommended? The items listed below may not be a complete list. Talk with your dietitian about what dietary choices are best for you. Grains Whole grains, such as whole-wheat or whole-grain breads, crackers, cereals, and pasta. Unsweetened oatmeal. Bulgur. Barley. Quinoa. Brown rice. Corn or whole-wheat flour tortillas or taco shells. Vegetables Lettuce. Spinach. Peas. Beets. Cauliflower. Cabbage. Broccoli. Carrots. Tomatoes. Squash. Eggplant. Herbs. Peppers. Onions. Cucumbers. Brussels sprouts. Fruits Berries. Bananas. Apples. Oranges. Grapes. Papaya. Mango. Pomegranate. Kiwi. Grapefruit. Cherries. Meats and other protein foods Seafood. Poultry without skin. Lean cuts of pork and beef. Tofu. Eggs. Nuts. Beans. Dairy Low-fat or fat-free dairy products, such as yogurt, cottage cheese, and cheese. Beverages Water. Tea. Coffee. Sugar-free or diet soda. Seltzer water. Lowfat or no-fat milk. Milk  alternatives, such as soy or almond milk. Fats and oils Olive oil. Canola oil. Sunflower oil. Grapeseed oil. Avocado. Walnuts. Sweets and desserts Sugar-free or low-fat pudding. Sugar-free or low-fat ice cream and other frozen treats. Seasoning and other foods Herbs. Sodium-free spices. Mustard. Relish. Low-fat, low-sugar ketchup. Low-fat, low-sugar barbecue sauce. Low-fat or fat-free mayonnaise. What foods are not recommended? The items listed below may not be a complete list. Talk with your dietitian about what dietary choices are best for you. Grains Refined white flour and flour products, such as bread, pasta, snack foods, and cereals. Vegetables Canned vegetables. Frozen vegetables with butter or cream sauce. Fruits Fruits canned with syrup. Meats and other protein foods Fatty cuts of meat. Poultry with skin. Breaded or fried meat. Processed meats. Dairy Full-fat yogurt, cheese, or milk. Beverages Sweetened drinks, such as sweet iced tea and soda. Fats and oils Butter. Lard. Ghee. Sweets and desserts Baked goods, such as cake, cupcakes, pastries, cookies, and cheesecake. Seasoning and other foods Spice mixes with added salt. Ketchup. Barbecue sauce. Mayonnaise. Summary  To prevent diabetes from developing, you may need to make diet and other lifestyle changes to help control blood sugar, improve cholesterol levels, and manage your blood pressure.  Set weight loss goals with the help of your health care team. It is recommended that most people with prediabetes lose 7 percent of their current body weight.  Consider following a Mediterranean diet that includes plenty of fresh fruits and vegetables, whole grains, beans, nuts, seeds, fish, lean meat, low-fat dairy, and healthy oils. This information is not intended to replace advice given to you by your health care provider. Make sure you discuss any questions you have with your health care provider. Document Revised: 04/03/2019  Document Reviewed: 02/13/2017 Elsevier Patient Education  2020 [employer].

## 2020-01-13 NOTE — Progress Notes (Signed)
Virtual Visit via Video Note  I connected with Cory Gomez   on 01/13/20 at  8:00 AM EST by a video enabled telemedicine application and verified that I am speaking with the correct person using two identifiers.  Location patient: home Location provider:work or home office Persons participating in the virtual visit: patient, provider  I discussed the limitations of evaluation and management by telemedicine and the availability of in person appointments. The patient expressed understanding and agreed to proceed.   HPI: 12/28/19 + covid 19 doing better after vitamin D,C, zinc, quercetin wife also was sick and also dx'ed with asthma  Still no taste/smell   Depression/stress/anxiety f/u Dr. Pixie Casino last week and he still needs a new therapist which has taken months to get stress level high due to being only one working at home   Hld/elevated lfts which resolved/prediabetes will repeat fasting labs 06/10/20 reduced soda intake  If lfts elevated consider US abdomen in future did not do when ordered previously ? Fatty liver   He wad referred and did see ent but had an insurance issue so no further w/u pursued and he needs to call them back   Acne improved to back with benzoyl peroxide wash but still there pt thinks linked to stress    ROS: See pertinent positives and negatives per HPI.  Past Medical History:  Diagnosis Date  . ADHD (attention deficit hyperactivity disorder)   . Anxiety and depression   . COVID-19    12/28/19  . Insomnia   . Obesity (BMI 30-39.9)   . Prediabetes     Past Surgical History:  Procedure Laterality Date  . NO PAST SURGERIES      Family History  Problem Relation Age of Onset  . Hypertension Mother   . Hyperlipidemia Mother   . Diabetes Mellitus II Mother   . Anxiety disorder Mother   . Depression Mother   . Anxiety disorder Brother   . Depression Brother   . Hypertension Brother   . Hyperlipidemia Brother   . Diabetes type II Brother   . 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  amphetamine-dextroamphetamine (ADDERALL XR) 30 MG 24 hr capsule, Take 1 capsule (30 mg total) by mouth daily., Disp: 30 capsule, Rfl: 0 .  Benzoyl Peroxide 10 % LIQD, Apply 1 Bottle topically daily as needed. Acne Be careful may bleach clothing, Disp: 160 g, Rfl: 11 .  carbamide peroxide (DEBROX) 6.5 % OTIC solution, Place 5 drops into the left ear 2 (two) times daily. X 4-7 days left ear, Disp: 30 mL, Rfl: 0 .  [START ON 02/05/2020] sertraline (ZOLOFT) 100 MG tablet, Take 2 tablets (200 mg total) by mouth daily., Disp: 60 tablet, Rfl: 2 .  [START ON 02/05/2020] zolpidem (AMBIEN CR) 12.5 MG CR tablet, Take 1 tablet (12.5 mg total) by mouth at bedtime as needed for sleep., Disp: 30 tablet, Rfl: 2  EXAM:  VITALS per patient if applicable:  GENERAL: alert, oriented, appears well and in no acute distress  HEENT: atraumatic, conjunttiva clear, no obvious abnormalities on inspection of external nose and ears  NECK: normal movements of the head and neck  LUNGS: on inspection no signs of respiratory distress, breathing rate appears normal, no obvious gross SOB, gasping or wheezing  CV: no obvious  cyanosis  MS: moves all visible extremities without noticeable abnormality  PSYCH/NEURO: pleasant and cooperative, no obvious depression or anxiety, speech and thought processing grossly intact  ASSESSMENT AND PLAN:  Discussed the following assessment and plan:  Hyperlipidemia/prediabetes/obesity  Labs due 06/10/20  rec healthy diet and exercise   Vitamin D deficiency - Plan: Vitamin D (25 hydroxy) taking 4000 IU D3 qd   Depression, recurrent (HCC) uncontrolled  Insomnia, unspecified type Suicidal ideation  -sent message to psychiatry and therapy needs to est another disc Cory Gomez if having trouble getting  therapist at Dr. Corky Sing office it has been months and no new therapist sent Dr. Demetrius Charity a message pt needs therapist urgently    HM Flu shot utd Tdap rec in future 05/2020  Covid 19 vx declines for now pt had covid 19 12/28/19 doing better   Will need to exercise to lose wt  rec healthy diet and exercise Acne to back some better with benzyol peroxide wash   Former PCP Dr. Dewaine Oats -we discussed possible serious and likely etiologies, options for evaluation and workup, limitations of telemedicine visit vs in person visit, treatment, treatment risks and precautions. Pt prefers to treat via telemedicine empirically rather then risking or undertaking an in person visit at this moment. Patient agrees to seek prompt in person care if worsening, new symptoms arise, or if is not improving with treatment.   I discussed the assessment and treatment plan with the patient. The patient was provided an opportunity to ask questions and all were answered. The patient agreed with the plan and demonstrated an understanding of the instructions.   The patient was advised to call back or seek an in-person evaluation if the symptoms worsen or if the condition fails to improve as anticipated.  Time spent 20-29 min Bevelyn Buckles, MD

## 2020-01-29 ENCOUNTER — Encounter: Payer: Self-pay | Admitting: Internal Medicine

## 2020-02-01 ENCOUNTER — Telehealth: Payer: Self-pay | Admitting: Internal Medicine

## 2020-02-01 NOTE — Telephone Encounter (Signed)
Good afternoon Cory Gomez. I have not heard from anybody regarding my therapy. Who do I need to call. I need to talk to someone.  Fax this note to pts psychiatrist [facility] in [place] ATTN below   He has been w/o a therapist for months and this office was supposed to set him up with a new one and he needs for stress/anxiety/depression   [name], MD=psychiatrist  01/07/2020, 4:12 PM

## 2020-02-01 NOTE — Telephone Encounter (Signed)
He is scheduled on 2/17

## 2020-02-10 ENCOUNTER — Ambulatory Visit (INDEPENDENT_AMBULATORY_CARE_PROVIDER_SITE_OTHER): Payer: BC Managed Care – PPO | Admitting: Psychiatry

## 2020-02-10 ENCOUNTER — Other Ambulatory Visit: Payer: Self-pay

## 2020-02-10 DIAGNOSIS — F3341 Major depressive disorder, recurrent, in partial remission: Secondary | ICD-10-CM

## 2020-02-10 DIAGNOSIS — F411 Generalized anxiety disorder: Secondary | ICD-10-CM

## 2020-02-10 DIAGNOSIS — F900 Attention-deficit hyperactivity disorder, predominantly inattentive type: Secondary | ICD-10-CM

## 2020-02-10 MED ORDER — AMPHETAMINE-DEXTROAMPHET ER 30 MG PO CP24: 30.0000 mg | ORAL_CAPSULE | Freq: Every day | ORAL | 0 refills | Status: DC

## 2020-02-10 NOTE — Progress Notes (Signed)
BH MD/PA/NP OP Progress Note  02/10/2020 4:09 PM Cory Gomez  MRN:  [id number] Interview was conducted by phone and I verified that I was speaking with the correct person using two identifiers. I discussed the limitations of evaluation and management by telemedicine and  the availability of in person appointments. Patient expressed understanding and agreed to proceed.  Chief Complaint: "I am doing well".  HPI: 39year old married male who presents with chronic depression which worsened over past 4 months.In addition to depression/anxiety he also admits to binge eating - feels that when stressed out he overeats. He reports marital conflict and difficulty with communication being likely a trigger for current depression. He has been married just about a year after knowing his wife for just 3 months prior. He was previously married for 2.5 years but after he found his ex wife cheating on him he got divorced. Heremarried within 7 months. 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 At home he feels unappreciated for all the effort he puts into household chores. Financial stressors dominate at this time. His wife resumed workbut they both caught COVID and whereas he fully recovered and is back at works she is not. Sleep improved and he is only sporadically needing to takeAmbien CR. While in school he was diagnosed with having ADHD inattentive type and was on Ritalin.We have started Adderall XR 25 mg (Vyvanse is prohibitively priced for him) and he noticed some improvement.The dose has since been increased to 30 mg. Zoloft was added for depression/anxiety - he tolerates it well- we increased dosegradually to 200 mg. He noticed improvement in mood as well..   Visit Diagnosis:    ICD-10-CM   1. GAD (generalized anxiety disorder)  F41.1   2. Attention deficit hyperactivity disorder (ADHD), predominantly inattentive type  F90.0   3. Major depressive disorder, recurrent episode, in partial  remission (HCC)  F33.41     Past Psychiatric History: Please see intake H&P.  Past Medical History:  Past Medical History:  Diagnosis Date  . ADHD (attention deficit hyperactivity disorder)   . Anxiety and depression   . COVID-19    12/28/19  . Insomnia   . Obesity (BMI 30-39.9)   . Prediabetes     Past Surgical History:  Procedure Laterality Date  . NO PAST SURGERIES      Family Psychiatric History: Reviewed.  Family History:  Family History  Problem Relation Age of Onset  . Hypertension Mother   . Hyperlipidemia Mother   . Diabetes Mellitus II Mother   . Anxiety disorder Mother   . Depression Mother   . Anxiety disorder Brother   . Depression Brother   . Hypertension Brother   . Hyperlipidemia Brother   . Diabetes type II Brother   . ADD / ADHD Brother     Social History:  Social History   Socioeconomic History  . Marital status: Married    Spouse name: Not on file  . Number of children: Not on file  . Years of education: Not on file  . Highest education level: Not on file  Occupational History  . Not on file  Tobacco Use  . Smoking status: Never Smoker  . Smokeless tobacco: Never Used  Substance and Sexual Activity  . Alcohol use: Not Currently  . Drug use: Not Currently  . Sexual activity: Yes    Partners: Female  Other Topics Concern  . Not on file  Social History Narrative   Married no kids  wife is Cory Gomez    -wife and [name] on [place]      Has siblings    Works Educational psychologist IT    1 dog    Social Determinants of Corporate investment banker Strain:   . Difficulty of Paying Living Expenses: Not on file  Food Insecurity:   . Worried About Programme researcher, broadcasting/film/video in the Last Year: Not on file  . Ran Out of Food in the Last Year: Not on file  Transportation Needs:   . Lack of Transportation (Medical): Not on file  . Lack of Transportation (Non-Medical): Not on file  Physical Activity:   . Days of Exercise per Week: Not on file  .  Minutes of Exercise per Session: Not on file  Stress:   . Feeling of Stress : Not on file  Social Connections:   . Frequency of Communication with Friends and Family: Not on file  . Frequency of Social Gatherings with Friends and Family: Not on file  . Attends Religious Services: Not on file  . Active Member of Clubs or Organizations: Not on file  . Attends Banker Meetings: Not on file  . Marital Status: Not on file    Allergies: No Known Allergies  Metabolic Disorder Labs: Lab Results  Component Value Date   HGBA1C 6.4 06/11/2019   No results found for: PROLACTIN Lab Results  Component Value Date   CHOL 175 06/11/2019   TRIG 200.0 (H) 06/11/2019   HDL 33.20 (L) 06/11/2019   CHOLHDL 5 06/11/2019   VLDL 40.0 06/11/2019   LDLCALC 102 (H) 06/11/2019   Lab Results  Component Value Date   TSH 3.38 06/11/2019    Therapeutic Level Labs: No results found for: LITHIUM No results found for: VALPROATE No components found for:  CBMZ  Current Medications: Current Outpatient Medications  Medication Sig Dispense Refill  . [START ON 04/10/2020] amphetamine-dextroamphetamine (ADDERALL XR) 30 MG 24 hr capsule Take 1 capsule (30 mg total) by mouth daily. 30 capsule 0  . [START ON 03/11/2020] amphetamine-dextroamphetamine (ADDERALL XR) 30 MG 24 hr capsule Take 1 capsule (30 mg total) by mouth daily. 30 capsule 0  . amphetamine-dextroamphetamine (ADDERALL XR) 30 MG 24 hr capsule Take 1 capsule (30 mg total) by mouth daily. 30 capsule 0  . Benzoyl Peroxide 10 % LIQD Apply 1 Bottle topically daily as needed. Acne Be careful may bleach clothing 160 g 11  . carbamide peroxide (DEBROX) 6.5 % OTIC solution Place 5 drops into the left ear 2 (two) times daily. X 4-7 days left ear 30 mL 0  . sertraline (ZOLOFT) 100 MG tablet Take 2 tablets (200 mg total) by mouth daily. 60 tablet 2  . zolpidem (AMBIEN CR) 12.5 MG CR tablet Take 1 tablet (12.5 mg total) by mouth at bedtime as needed for  sleep. 30 tablet 2   No current facility-administered medications for this visit.     Psychiatric Specialty Exam: Review of Systems  All other systems reviewed and are negative.   There were no vitals taken for this visit.There is no height or weight on file to calculate BMI.  General Appearance: NA  Eye Contact:  NA  Speech:  Clear and Coherent and Normal Rate  Volume:  Normal  Mood:  Euthymic  Affect:  NA  Thought Process:  Goal Directed and Linear  Orientation:  Full (Time, Place, and Person)  Thought Content: Logical   Suicidal Thoughts:  No  Homicidal  Thoughts:  No  Memory:  Immediate;   Good Recent;   Good Remote;   Good  Judgement:  Good  Insight:  Good  Psychomotor Activity:  NA  Concentration:  Concentration: Good  Recall:  Good  Fund of Knowledge: Good  Language: Good  Akathisia:  Negative  Handed:  Right  AIMS (if indicated): not done  Assets:  Communication Skills Desire for Improvement Financial Resources/Insurance Housing Physical Health Social Support Talents/Skills  ADL's:  Intact  Cognition: WNL  Sleep:  Good   Screenings: GAD-7     Office Visit from 09/08/2019 in [facility]  Total GAD-7 Score  20    PHQ2-9     Office Visit from 01/13/2020 in [facility] Office Visit from 09/08/2019 in [facility] Office Visit from 06/02/2019 in [facility]  PHQ-2 Total Score  0  6  6  PHQ-9 Total Score  --  26  19       Assessment and Plan: 39year old married male who presents with chronic depression which worsened over past 4 months.In addition to depression/anxiety he also admits to binge eating - feels that when stressed out he overeats. He reports marital conflict and difficulty with communication being likely a trigger for current depression. He has been married just about a year after knowing his wife for just 3 months prior. He was previously married for 2.5 years but after  he found his ex wife cheating on him he got divorced. Heremarried within 7 months. 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 At home he feels unappreciated for all the effort he puts into household chores. Financial stressors dominate at this time. His wife resumed workbut they both caught COVID and whereas he fully recovered and is back at works she is not. Sleep improved and he is only sporadically needing to takeAmbien CR. While in school he was diagnosed with having ADHD inattentive type and was on Ritalin.We have started Adderall XR 25 mg (Vyvanse is prohibitively priced for him) and he noticed some improvement.The dose has since been increased to 30 mg. Zoloft was added for depression/anxiety - he tolerates it well- we increased dosegradually to 200 mg. He noticed improvement in mood as well..  Dx: Major depressive disorder recurrent in partial remission; GAD; ADD; Binge eating disorder  Plan:Continuesertraline to200mg ,zolpidem CRand Adderall XR 30 mg.Next appointment with me in3 months.The plan was discussed with patient who had an opportunity to ask questions and these were all answered. I spend78minutes inphone consultationwith the patient.    Magdalene Patricia, MD 02/10/2020, 4:09 PM

## 2020-04-22 ENCOUNTER — Telehealth (INDEPENDENT_AMBULATORY_CARE_PROVIDER_SITE_OTHER): Payer: BC Managed Care – PPO | Admitting: Licensed Clinical Social Worker

## 2020-04-22 DIAGNOSIS — F331 Major depressive disorder, recurrent, moderate: Secondary | ICD-10-CM

## 2020-04-22 NOTE — Progress Notes (Signed)
Virtual Visit via Video Note  I connected with Cory Gomez on 04/22/20 at  8:00 AM EDT by a video enabled telemedicine application and verified that I am speaking with the correct person using two identifiers.  Location: Patient: Work Provider: Office   I discussed the limitations of evaluation and management by telemedicine and the availability of in person appointments. The patient expressed understanding and agreed to proceed.  THERAPIST PROGRESS NOTE  Session Time: 8:00 am-8:45 am  Participation Level: Active  Behavioral Response: CasualAlertDepressed  Type of Therapy: Individual Therapy  Treatment Goals addressed: Coping  Interventions: CBT and Solution Focused  Case Summary: Clebert Wenger is a 39 y.o. male who presents oriented x5 (person, place, situation, time and object), casually dressed, appropriately groomed, average height, overweight, and cooperative to address mood. Patient has a history of medical treatment including prediabetes and obesity. Patient has a minimal history of mental health treatment including medication management and outpatient therapy. Patient admits to thoughts of SI but denies a plan and denies homicidal ideations. Patient denies psychosis including auditory and visual hallucinations. Patient denies substance abuse. Patient is at low risk for lethality at this time.  Session # 4  Physically: Patient denied health issues.  Spiritually/values: Patient has been praying more and feels like it helps.  Relationships: Patient is still frustrated with his wife. He feels like she doesn't take responsibility for herself and finances. Patient said that she gotten two jobs and then quits due to being "sick." Patient said that she does have some health issues but none that would prevent her from working. Patient feels like he is carrying the weight of the responsibilities of the marriage. Patient feels like both partners need to work to provide and  that house chores need to be shared. Patient's was taught growing up that you need to work to have what you want in life and doesn't feel like his wife has the same values.  Emotionally/Mentally/Behavior:  Patient's mood has been depressed, anxious, and angry. He feels like a lot of the concerns are his wife's lack of responsibility for herself. Patient feels resentful toward her. Patient is angry that she doesn't do "anything." He is worried that she will get a job and then quit.   Patient engaged in session. He responded well to interventions. Patient continues to meet criteria for Major depressive disorder, recurrent, moderate. Patient will continue in outpatient therapy due to being the least restrictive service to meet his needs at this time. Patient made minimal progress on his goals.    Suicidal/Homicidal: Negativewithout intent/plan  Therapist Response: Therapist reviewed patient's recent thoughts and behaviors. Therapist utilized CBT to address mood. Therapist processed patient's triggers for depression. Therapist discussed with patient his relationship and how his values impact his mood   Plan: Return again in 2- 4 weeks.   Diagnosis: Axis I: Major depressive disorder, recurrent, moderate    Axis II: No diagnosis   I discussed the assessment and treatment plan with the patient. The patient was provided an opportunity to ask questions and all were answered. The patient agreed with the plan and demonstrated an understanding of the instructions.   The patient was advised to call back or seek an in-person evaluation if the symptoms worsen or if the condition fails to improve as anticipated.  I provided 45 minutes of non-face-to-face time during this encounter.   Bynum Bellows, LCSW 04/22/2020

## 2020-05-05 ENCOUNTER — Telehealth (INDEPENDENT_AMBULATORY_CARE_PROVIDER_SITE_OTHER): Payer: BC Managed Care – PPO | Admitting: Psychiatry

## 2020-05-05 ENCOUNTER — Other Ambulatory Visit: Payer: Self-pay

## 2020-05-05 DIAGNOSIS — F3341 Major depressive disorder, recurrent, in partial remission: Secondary | ICD-10-CM

## 2020-05-05 DIAGNOSIS — F900 Attention-deficit hyperactivity disorder, predominantly inattentive type: Secondary | ICD-10-CM | POA: Diagnosis not present

## 2020-05-05 DIAGNOSIS — F411 Generalized anxiety disorder: Secondary | ICD-10-CM

## 2020-05-05 MED ORDER — AMPHETAMINE-DEXTROAMPHET ER 30 MG PO CP24: 30.0000 mg | ORAL_CAPSULE | Freq: Every day | ORAL | 0 refills | Status: DC

## 2020-05-05 MED ORDER — ZOLPIDEM TARTRATE ER 12.5 MG PO TBCR: 12.5000 mg | EXTENDED_RELEASE_TABLET | Freq: Every evening | ORAL | 2 refills | Status: DC | PRN

## 2020-05-05 MED ORDER — SERTRALINE HCL 100 MG PO TABS: 200.0000 mg | ORAL_TABLET | Freq: Every day | ORAL | 0 refills | Status: DC

## 2020-05-05 NOTE — Progress Notes (Signed)
BH MD/PA/NP OP Progress Note  05/05/2020 4:11 PM Cory Gomez  MRN:  [id number] Interview was conducted by phone and I verified that I was speaking with the correct person using two identifiers. I discussed the limitations of evaluation and management by telemedicine and  the availability of in person appointments. Patient expressed understanding and agreed to proceed.  Chief Complaint: "I am doing well".  HPI: 39year old married male who presents with chronic depression which worsened over past 4 months.In addition to depression/anxiety he also admits to binge eating - feels that when stressed out he overeats. He reports marital conflict and difficulty with communication being likely a trigger for current depression. He has been married just about a year after knowing his wife for just 3 months prior. He was previously married for 2.5 years but after he found his ex wife cheating on him he got divorced. Heremarried within 7 months. Sleep improved and he is only sporadically needing to takeAmbien CR. While in school he was diagnosed with having ADHD inattentive type and was on Ritalin.We have started Adderall XR 25 mg (Vyvanse is prohibitively priced for him) and he noticed some improvement.The dose has since been increased to 30 mg. Zoloft was added for depression/anxiety - he tolerates it well- we increased dosegradually to 200 mg. He noticed improvement in mood as well..  Visit Diagnosis:    ICD-10-CM   1. Major depressive disorder, recurrent episode, in partial remission (HCC)  F33.41   2. GAD (generalized anxiety disorder)  F41.1   3. Attention deficit hyperactivity disorder (ADHD), predominantly inattentive type  F90.0     Past Psychiatric History: Please see intake H&P.  Past Medical History:  Past Medical History:  Diagnosis Date  . ADHD (attention deficit hyperactivity disorder)   . Anxiety and depression   . COVID-19    12/28/19  . Insomnia   . Obesity (BMI  30-39.9)   . Prediabetes     Past Surgical History:  Procedure Laterality Date  . NO PAST SURGERIES      Family Psychiatric History: Reviewed.  Family History:  Family History  Problem Relation Age of Onset  . Hypertension Mother   . Hyperlipidemia Mother   . Diabetes Mellitus II Mother   . Anxiety disorder Mother   . Depression Mother   . Anxiety disorder Brother   . Depression Brother   . Hypertension Brother   . Hyperlipidemia Brother   . Diabetes type II Brother   . ADD / ADHD Brother     Social History:  Social History   Socioeconomic History  . Marital status: Married    Spouse name: Not on file  . Number of children: Not on file  . Years of education: Not on file  . Highest education level: Not on file  Occupational History  . Not on file  Tobacco Use  . Smoking status: Never Smoker  . Smokeless tobacco: Never Used  Substance and Sexual Activity  . Alcohol use: Not Currently  . Drug use: Not Currently  . Sexual activity: Yes    Partners: Female  Other Topics Concern  . Not on file  Social History Narrative   Married no kids wife is Constance Hackenberg    -wife and mother [name] on [place]      Has siblings    Works Educational psychologist IT    1 dog    Social Determinants of Corporate investment banker Strain:   . Difficulty of Paying  Living Expenses:   Food Insecurity:   . Worried About Charity fundraiser in the Last Year:   . Arboriculturist in the Last Year:   Transportation Needs:   . Film/video editor (Medical):   Marland Kitchen Lack of Transportation (Non-Medical):   Physical Activity:   . Days of Exercise per Week:   . Minutes of Exercise per Session:   Stress:   . Feeling of Stress :   Social Connections:   . Frequency of Communication with Friends and Family:   . Frequency of Social Gatherings with Friends and Family:   . Attends Religious Services:   . Active Member of Clubs or Organizations:   . Attends Archivist Meetings:   Marland Kitchen Marital  Status:     Allergies: No Known Allergies  Metabolic Disorder Labs: Lab Results  Component Value Date   HGBA1C 6.4 06/11/2019   No results found for: PROLACTIN Lab Results  Component Value Date   CHOL 175 06/11/2019   TRIG 200.0 (H) 06/11/2019   HDL 33.20 (L) 06/11/2019   CHOLHDL 5 06/11/2019   VLDL 40.0 06/11/2019   LDLCALC 102 (H) 06/11/2019   Lab Results  Component Value Date   TSH 3.38 06/11/2019    Therapeutic Level Labs: No results found for: LITHIUM No results found for: VALPROATE No components found for:  CBMZ  Current Medications: Current Outpatient Medications  Medication Sig Dispense Refill  . [START ON 07/09/2020] amphetamine-dextroamphetamine (ADDERALL XR) 30 MG 24 hr capsule Take 1 capsule (30 mg total) by mouth daily. 30 capsule 0  . [START ON 06/09/2020] amphetamine-dextroamphetamine (ADDERALL XR) 30 MG 24 hr capsule Take 1 capsule (30 mg total) by mouth daily. 30 capsule 0  . [START ON 05/09/2020] amphetamine-dextroamphetamine (ADDERALL XR) 30 MG 24 hr capsule Take 1 capsule (30 mg total) by mouth daily. 30 capsule 0  . Benzoyl Peroxide 10 % LIQD Apply 1 Bottle topically daily as needed. Acne Be careful may bleach clothing 160 g 11  . carbamide peroxide (DEBROX) 6.5 % OTIC solution Place 5 drops into the left ear 2 (two) times daily. X 4-7 days left ear 30 mL 0  . sertraline (ZOLOFT) 100 MG tablet Take 2 tablets (200 mg total) by mouth daily. 180 tablet 0  . zolpidem (AMBIEN CR) 12.5 MG CR tablet Take 1 tablet (12.5 mg total) by mouth at bedtime as needed for sleep. 30 tablet 2   No current facility-administered medications for this visit.      Psychiatric Specialty Exam: Review of Systems  All other systems reviewed and are negative.   There were no vitals taken for this visit.There is no height or weight on file to calculate BMI.  General Appearance: NA  Eye Contact:  NA  Speech:  Clear and Coherent and Normal Rate  Volume:  Normal  Mood:   Minimal anxiety  Affect:  NA  Thought Process:  Goal Directed and Linear  Orientation:  Full (Time, Place, and Person)  Thought Content: Logical   Suicidal Thoughts:  No  Homicidal Thoughts:  No  Memory:  Immediate;   Good Recent;   Good Remote;   Good  Judgement:  Good  Insight:  Good  Psychomotor Activity:  NA  Concentration:  Concentration: Good  Recall:  Good  Fund of Knowledge: Good  Language: Good  Akathisia:  Negative  Handed:  Right  AIMS (if indicated): not done  Assets:  Desire for Improvement Financial Resources/Insurance Housing Talents/Skills  ADL's:  Intact  Cognition: WNL  Sleep:  Good   Screenings: GAD-7     Office Visit from 09/08/2019 in [facility]  Total GAD-7 Score  20    PHQ2-9     Office Visit from 01/13/2020 in [facility] Office Visit from 09/08/2019 in [facility] Office Visit from 06/02/2019 in [facility]  PHQ-2 Total Score  0  6  6  PHQ-9 Total Score  --  26  19       Assessment and Plan: 39year old married male who presents with chronic depression which worsened over past 4 months.In addition to depression/anxiety he also admits to binge eating - feels that when stressed out he overeats. He reports marital conflict and difficulty with communication being likely a trigger for current depression. He has been married just about a year after knowing his wife for just 3 months prior. He was previously married for 2.5 years but after he found his ex wife cheating on him he got divorced. Heremarried within 7 months. Sleep improved and he is only sporadically needing to takeAmbien CR. While in school he was diagnosed with having ADHD inattentive type and was on Ritalin.We have started Adderall XR 25 mg (Vyvanse is prohibitively priced for him) and he noticed some improvement.The dose has since been increased to 30 mg. Zoloft was added for depression/anxiety - he tolerates  it well- we increased dosegradually to 200 mg. He noticed improvement in mood as well..  Dx: Major depressive disorder recurrent in partial remission; GAD; ADD; Binge eating disorder  Plan:Continuesertraline to200mg ,zolpidem CRand Adderall XR 30 mg.Next appointment with me in3 months.The plan was discussed with patient who had an opportunity to ask questions and these were all answered. I spend94minutes inphone consultationwith the patient.    Magdalene Patricia, MD 05/05/2020, 4:11 PM

## 2020-05-16 ENCOUNTER — Telehealth (INDEPENDENT_AMBULATORY_CARE_PROVIDER_SITE_OTHER): Payer: BC Managed Care – PPO | Admitting: Licensed Clinical Social Worker

## 2020-05-16 DIAGNOSIS — F3341 Major depressive disorder, recurrent, in partial remission: Secondary | ICD-10-CM | POA: Diagnosis not present

## 2020-05-16 NOTE — Progress Notes (Signed)
Virtual Visit via Video Note  I connected with Cory Gomez on 05/16/20 at  8:00 AM EDT by a video enabled telemedicine application and verified that I am speaking with the correct person using two identifiers.  Location: Patient: Work Provider: Office   I discussed the limitations of evaluation and management by telemedicine and the availability of in person appointments. The patient expressed understanding and agreed to proceed.  THERAPIST PROGRESS NOTE  Session Time: 8:00 am-8:40 am  Participation Level: Active  Behavioral Response: CasualAlertDepressed  Type of Therapy: Individual Therapy  Treatment Goals addressed: Coping  Interventions: CBT and Solution Focused  Case Summary: Cory Gomez is a 39 y.o. male who presents oriented x5 (person, place, situation, time and object), casually dressed, appropriately groomed, average height, overweight, and cooperative to address mood. Patient has a history of medical treatment including prediabetes and obesity. Patient has a minimal history of mental health treatment including medication management and outpatient therapy. Patient admits to thoughts of SI but denies a plan and denies homicidal ideations. Patient denies psychosis including auditory and visual hallucinations. Patient denies substance abuse. Patient is at low risk for lethality at this time.  Session # 5  Physically: Patient has lost weight. He has tossed and turned at night due to worry about financial issues.  Spiritually/values: Patient has found a church. He has been more connected spiritually lately. He has been praying every morning. Patient feels like everything has changed for the better since he got saved.  Relationships: Patient is letting things go with his wife. He is going for a walk to avoid arguments. He is accepting things for how they are. Patient is slowing down his reactions and trying not to jump to conclusions.   Emotionally/Mentally/Behavior:  Patient's mood has greatly improved. He feels like he needs to be consistent on the steps that he is taking.   Patient engaged in session. He responded well to interventions. Patient continues to meet criteria for Major depressive disorder, recurrent, moderate. Patient will continue in outpatient therapy due to being the least restrictive service to meet his needs at this time. Patient made minimal progress on his goals.    Suicidal/Homicidal: Negativewithout intent/plan  Therapist Response: Therapist reviewed patient's recent thoughts and behaviors. Therapist utilized CBT to address mood. Therapist processed patient's triggers for depression. Therapist discussed with patient his spiritual health and how it improved his mood.   Plan: Return again in 2- 4 weeks.   Diagnosis: Axis I: Major depressive disorder, recurrent, moderate    Axis II: No diagnosis   I discussed the assessment and treatment plan with the patient. The patient was provided an opportunity to ask questions and all were answered. The patient agreed with the plan and demonstrated an understanding of the instructions.   The patient was advised to call back or seek an in-person evaluation if the symptoms worsen or if the condition fails to improve as anticipated.  I provided 45 minutes of non-face-to-face time during this encounter.   Cory Bellows, LCSW 05/16/2020

## 2020-06-14 ENCOUNTER — Other Ambulatory Visit: Payer: Self-pay

## 2020-06-14 ENCOUNTER — Other Ambulatory Visit (INDEPENDENT_AMBULATORY_CARE_PROVIDER_SITE_OTHER): Payer: BC Managed Care – PPO

## 2020-06-14 ENCOUNTER — Ambulatory Visit (INDEPENDENT_AMBULATORY_CARE_PROVIDER_SITE_OTHER): Payer: BC Managed Care – PPO

## 2020-06-14 DIAGNOSIS — Z23 Encounter for immunization: Secondary | ICD-10-CM | POA: Diagnosis not present

## 2020-06-14 DIAGNOSIS — Z1329 Encounter for screening for other suspected endocrine disorder: Secondary | ICD-10-CM | POA: Diagnosis not present

## 2020-06-14 DIAGNOSIS — R748 Abnormal levels of other serum enzymes: Secondary | ICD-10-CM

## 2020-06-14 DIAGNOSIS — E559 Vitamin D deficiency, unspecified: Secondary | ICD-10-CM | POA: Diagnosis not present

## 2020-06-14 DIAGNOSIS — Z0000 Encounter for general adult medical examination without abnormal findings: Secondary | ICD-10-CM | POA: Diagnosis not present

## 2020-06-14 DIAGNOSIS — E785 Hyperlipidemia, unspecified: Secondary | ICD-10-CM | POA: Diagnosis not present

## 2020-06-14 DIAGNOSIS — R7303 Prediabetes: Secondary | ICD-10-CM | POA: Diagnosis not present

## 2020-06-14 DIAGNOSIS — Z1389 Encounter for screening for other disorder: Secondary | ICD-10-CM | POA: Diagnosis not present

## 2020-06-14 LAB — HEMOGLOBIN A1C: Hgb A1c MFr Bld: 5.8 % (ref 4.6–6.5)

## 2020-06-14 LAB — CBC WITH DIFFERENTIAL/PLATELET
Basophils Absolute: 0.1 10*3/uL (ref 0.0–0.1)
Basophils Relative: 0.8 % (ref 0.0–3.0)
Eosinophils Absolute: 0.1 10*3/uL (ref 0.0–0.7)
Eosinophils Relative: 1.9 % (ref 0.0–5.0)
HCT: 41.6 % (ref 39.0–52.0)
Hemoglobin: 14.3 g/dL (ref 13.0–17.0)
Lymphocytes Relative: 25.2 % (ref 12.0–46.0)
Lymphs Abs: 1.6 10*3/uL (ref 0.7–4.0)
MCHC: 34.4 g/dL (ref 30.0–36.0)
MCV: 88.3 fl (ref 78.0–100.0)
Monocytes Absolute: 0.4 10*3/uL (ref 0.1–1.0)
Monocytes Relative: 7 % (ref 3.0–12.0)
Neutro Abs: 4.1 10*3/uL (ref 1.4–7.7)
Neutrophils Relative %: 65.1 % (ref 43.0–77.0)
Platelets: 261 10*3/uL (ref 150.0–400.0)
RBC: 4.71 Mil/uL (ref 4.22–5.81)
RDW: 13.6 % (ref 11.5–15.5)
WBC: 6.3 10*3/uL (ref 4.0–10.5)

## 2020-06-14 LAB — COMPREHENSIVE METABOLIC PANEL
ALT: 33 U/L (ref 0–53)
AST: 26 U/L (ref 0–37)
Albumin: 4.5 g/dL (ref 3.5–5.2)
Alkaline Phosphatase: 57 U/L (ref 39–117)
BUN: 9 mg/dL (ref 6–23)
CO2: 27 mEq/L (ref 19–32)
Calcium: 9.6 mg/dL (ref 8.4–10.5)
Chloride: 106 mEq/L (ref 96–112)
Creatinine, Ser: 0.76 mg/dL (ref 0.40–1.50)
GFR: 115.63 mL/min (ref >60.00)
Glucose, Bld: 110 mg/dL — ABNORMAL HIGH (ref 70–99)
Potassium: 4 mEq/L (ref 3.5–5.1)
Sodium: 140 mEq/L (ref 135–145)
Total Bilirubin: 0.5 mg/dL (ref 0.2–1.2)
Total Protein: 7.1 g/dL (ref 6.0–8.3)

## 2020-06-14 LAB — VITAMIN D 25 HYDROXY (VIT D DEFICIENCY, FRACTURES): VITD: 19.56 ng/mL — ABNORMAL LOW (ref 30.00–100.00)

## 2020-06-14 LAB — TSH: TSH: 2.55 u[IU]/mL (ref 0.35–4.50)

## 2020-06-14 LAB — LIPID PANEL
Cholesterol: 185 mg/dL (ref 0–200)
HDL: 29.1 mg/dL — ABNORMAL LOW (ref >39.00)
NonHDL: 156.29
Total CHOL/HDL Ratio: 6
Triglycerides: 215 mg/dL — ABNORMAL HIGH (ref 0.0–149.0)
VLDL: 43 mg/dL — ABNORMAL HIGH (ref 0.0–40.0)

## 2020-06-14 LAB — LDL CHOLESTEROL, DIRECT: Direct LDL: 131 mg/dL

## 2020-06-14 NOTE — Progress Notes (Signed)
Patient presented for Tdap injection to right deltoid, patient voiced no concerns nor showed any signs of distress during injection. 

## 2020-06-15 LAB — URINALYSIS, ROUTINE W REFLEX MICROSCOPIC
Bilirubin Urine: NEGATIVE
Glucose, UA: NEGATIVE
Hgb urine dipstick: NEGATIVE
Ketones, ur: NEGATIVE
Leukocytes,Ua: NEGATIVE
Nitrite: NEGATIVE
Protein, ur: NEGATIVE
Specific Gravity, Urine: 1.02 (ref 1.001–1.03)
pH: 5.5 (ref 5.0–8.0)

## 2020-06-20 ENCOUNTER — Other Ambulatory Visit: Payer: Self-pay | Admitting: Internal Medicine

## 2020-06-20 DIAGNOSIS — E559 Vitamin D deficiency, unspecified: Secondary | ICD-10-CM

## 2020-06-20 MED ORDER — CHOLECALCIFEROL 1.25 MG (50000 UT) PO CAPS: 50000.0000 [IU] | ORAL_CAPSULE | ORAL | 1 refills | Status: DC

## 2020-06-22 ENCOUNTER — Telehealth: Payer: Self-pay | Admitting: Internal Medicine

## 2020-06-22 ENCOUNTER — Telehealth: Payer: Self-pay

## 2020-06-22 NOTE — Telephone Encounter (Signed)
-----   Message from Cory Buckles, MD sent at 06/20/2020  3:49 PM EDT ----- Urine cloudy rec increase water intake  Cholesterol elevated  -mail cholesterol handout -rec health diet and exercise   Vitamin D low  I am sending in weekly D3 1x per week x 6 months then you will take D3 4000 IU daily starting month 7   Blood cts normal   Your prediabetes is better  Thyroid lab normal

## 2020-06-22 NOTE — Telephone Encounter (Signed)
Error

## 2020-06-22 NOTE — Telephone Encounter (Signed)
No answer, no voicemail.

## 2020-07-12 ENCOUNTER — Other Ambulatory Visit (HOSPITAL_COMMUNITY): Payer: Self-pay | Admitting: Psychiatry

## 2020-07-13 ENCOUNTER — Ambulatory Visit (INDEPENDENT_AMBULATORY_CARE_PROVIDER_SITE_OTHER): Payer: BC Managed Care – PPO | Admitting: Internal Medicine

## 2020-07-13 ENCOUNTER — Other Ambulatory Visit: Payer: Self-pay

## 2020-07-13 ENCOUNTER — Encounter: Payer: Self-pay | Admitting: Internal Medicine

## 2020-07-13 VITALS — BP 120/82 | HR 88 | Temp 97.5°F | Ht 74.0 in | Wt 310.4 lb

## 2020-07-13 DIAGNOSIS — Z0000 Encounter for general adult medical examination without abnormal findings: Secondary | ICD-10-CM | POA: Diagnosis not present

## 2020-07-13 DIAGNOSIS — F32A Depression, unspecified: Secondary | ICD-10-CM

## 2020-07-13 DIAGNOSIS — Z1389 Encounter for screening for other disorder: Secondary | ICD-10-CM

## 2020-07-13 DIAGNOSIS — F419 Anxiety disorder, unspecified: Secondary | ICD-10-CM

## 2020-07-13 DIAGNOSIS — R7303 Prediabetes: Secondary | ICD-10-CM

## 2020-07-13 DIAGNOSIS — E669 Obesity, unspecified: Secondary | ICD-10-CM

## 2020-07-13 DIAGNOSIS — E785 Hyperlipidemia, unspecified: Secondary | ICD-10-CM

## 2020-07-13 DIAGNOSIS — H547 Unspecified visual loss: Secondary | ICD-10-CM

## 2020-07-13 DIAGNOSIS — E559 Vitamin D deficiency, unspecified: Secondary | ICD-10-CM

## 2020-07-13 DIAGNOSIS — Z1329 Encounter for screening for other suspected endocrine disorder: Secondary | ICD-10-CM

## 2020-07-13 DIAGNOSIS — F329 Major depressive disorder, single episode, unspecified: Secondary | ICD-10-CM

## 2020-07-13 NOTE — Patient Instructions (Addendum)
Panoxyl wash 10% COVID-19 Vaccine Information can be found at: [email or web address] For questions related to vaccine distribution or appointments, please email [email or web address] or call [phone].    Vitamin D3 4000 to max 5000 daily   Let me know about eye doctors appointment [facility]  Dr. Fannie Knee   Weight loss surgeons  Dr. Primus Bravo [facility]  Dr. Ovidio Kin [facility] weekly for weight loss    Prediabetes Eating Plan Prediabetes is a condition that causes blood sugar (glucose) levels to be higher than normal. This increases the risk for developing diabetes. In order to prevent diabetes from developing, your health care provider may recommend a diet and other lifestyle changes to help you:  Control your blood glucose levels.  Improve your cholesterol levels.  Manage your blood pressure. Your health care provider may recommend working with a diet and nutrition specialist (dietitian) to make a meal plan that is best for you. What are tips for following this plan? Lifestyle  Set weight loss goals with the help of your health care team. It is recommended that most people with prediabetes lose 7% of their current body weight.  Exercise for at least 30 minutes at least 5 days a week.  Attend a support group or seek ongoing support from a mental health counselor.  Take over-the-counter and prescription medicines only as told by your health care provider. Reading food labels  Read food labels to check the amount of fat, salt (sodium), and sugar in prepackaged foods. Avoid foods that have: ? Saturated fats. ? Trans fats. ? Added sugars.  Avoid foods that have more than 300 milligrams (mg) of sodium per serving. Limit your daily sodium intake to less than 2,300 mg each day. Shopping  Avoid buying pre-made and processed foods. Cooking  Cook with olive oil. Do not use butter,  lard, or ghee.  Bake, broil, grill, or boil foods. Avoid frying. Meal planning   Work with your dietitian to develop an eating plan that is right for you. This may include: ? Tracking how many calories you take in. Use a food diary, notebook, or mobile application to track what you eat at each meal. ? Using the glycemic index (GI) to plan your meals. The index tells you how quickly a food will raise your blood glucose. Choose low-GI foods. These foods take a longer time to raise blood glucose.  Consider following a Mediterranean diet. This diet includes: ? Several servings each day of fresh fruits and vegetables. ? Eating fish at least twice a week. ? Several servings each day of whole grains, beans, nuts, and seeds. ? Using olive oil instead of other fats. ? Moderate alcohol consumption. ? Eating small amounts of red meat and whole-fat dairy.  If you have high blood pressure, you may need to limit your sodium intake or follow a diet such as the DASH eating plan. DASH is an eating plan that aims to lower high blood pressure. What foods are recommended? The items listed below may not be a complete list. Talk with your dietitian about what dietary choices are best for you. Grains Whole grains, such as whole-wheat or whole-grain breads, crackers, cereals, and pasta. Unsweetened oatmeal. Bulgur. Barley. Quinoa. Brown rice. Corn or whole-wheat flour tortillas or taco shells. Vegetables Lettuce. Spinach. Peas. Beets. Cauliflower. Cabbage. Broccoli. Carrots. Tomatoes. Squash. Eggplant. Herbs. Peppers. Onions. Cucumbers. Brussels sprouts. Fruits Berries. Bananas. Apples. Oranges. Grapes. Papaya. Mango. Pomegranate. Kiwi. Grapefruit. Cherries. Meats and other  protein foods Seafood. Poultry without skin. Lean cuts of pork and beef. Tofu. Eggs. Nuts. Beans. Dairy Low-fat or fat-free dairy products, such as yogurt, cottage cheese, and cheese. Beverages Water. Tea. Coffee. Sugar-free or diet soda.  Seltzer water. Lowfat or no-fat milk. Milk alternatives, such as soy or almond milk. Fats and oils Olive oil. Canola oil. Sunflower oil. Grapeseed oil. Avocado. Walnuts. Sweets and desserts Sugar-free or low-fat pudding. Sugar-free or low-fat ice cream and other frozen treats. Seasoning and other foods Herbs. Sodium-free spices. Mustard. Relish. Low-fat, low-sugar ketchup. Low-fat, low-sugar barbecue sauce. Low-fat or fat-free mayonnaise. What foods are not recommended? The items listed below may not be a complete list. Talk with your dietitian about what dietary choices are best for you. Grains Refined white flour and flour products, such as bread, pasta, snack foods, and cereals. Vegetables Canned vegetables. Frozen vegetables with butter or cream sauce. Fruits Fruits canned with syrup. Meats and other protein foods Fatty cuts of meat. Poultry with skin. Breaded or fried meat. Processed meats. Dairy Full-fat yogurt, cheese, or milk. Beverages Sweetened drinks, such as sweet iced tea and soda. Fats and oils Butter. Lard. Ghee. Sweets and desserts Baked goods, such as cake, cupcakes, pastries, cookies, and cheesecake. Seasoning and other foods Spice mixes with added salt. Ketchup. Barbecue sauce. Mayonnaise. Summary  To prevent diabetes from developing, you may need to make diet and other lifestyle changes to help control blood sugar, improve cholesterol levels, and manage your blood pressure.  Set weight loss goals with the help of your health care team. It is recommended that most people with prediabetes lose 7 percent of their current body weight.  Consider following a Mediterranean diet that includes plenty of fresh fruits and vegetables, whole grains, beans, nuts, seeds, fish, lean meat, low-fat dairy, and healthy oils. This information is not intended to replace advice given to you by your health care provider. Make sure you discuss any questions you have with your health care  provider. Document Revised: 04/03/2019 Document Reviewed: 02/13/2017 Elsevier Patient Education  2020 [employer].

## 2020-07-13 NOTE — Progress Notes (Signed)
Chief Complaint  Patient presents with  . Follow-up   F/u  1. Anxiety depression doing well on zoloft 200 mg qd f/u therapy josh sheetz 1x per month and psych and recently saved to the [employer] and attending church again  2. Prediabetes/HLD/obesity rec healthy diet and exercise and he is walking in his neighborhood  3. Short and long distance vision hard will let me know who to see for eye md and will refer  Review of Systems  Constitutional: Negative for weight loss.  HENT: Negative for hearing loss.   Eyes:       +vision reduced   Respiratory: Negative for shortness of breath.   Cardiovascular: Negative for chest pain.  Gastrointestinal: Negative for abdominal pain.  Musculoskeletal: Negative for falls.  Skin: Negative for rash.  Neurological: Negative for headaches.  Psychiatric/Behavioral: Negative for depression. The patient is not nervous/anxious.    Past Medical History:  Diagnosis Date  . ADHD (attention deficit hyperactivity disorder)   . Anxiety and depression   . COVID-19    12/28/19  . Insomnia   . Obesity (BMI 30-39.9)   . Prediabetes    Past Surgical History:  Procedure Laterality Date  . NO PAST SURGERIES     Family History  Problem Relation Age of Onset  . Hypertension Mother   . Hyperlipidemia Mother   . Diabetes Mellitus II Mother   . Anxiety disorder Mother   . Depression Mother   . Anxiety disorder Brother   . Depression Brother   . Hypertension Brother   . Hyperlipidemia Brother   . Diabetes type II Brother   . ADD / ADHD Brother   . Dementia Maternal Grandfather    Social History   Socioeconomic History  . Marital status: Married    Spouse name: Not on file  . Number of children: Not on file  . Years of education: Not on file  . Highest education level: Not on file  Occupational History  . Not on file  Tobacco Use  . Smoking status: Never Smoker  . Smokeless tobacco: Never Used  Substance and Sexual Activity  . Alcohol use:  Not Currently  . Drug use: Not Currently  . Sexual activity: Yes    Partners: Female  Other Topics Concern  . Not on file  Social History Narrative   Married no kids wife is Iziah Cates    -wife and mother [name] on [place]      Has siblings    Works Educational psychologist IT    1 dog    Social Determinants of Corporate investment banker Strain:   . Difficulty of Paying Living Expenses:   Food Insecurity:   . Worried About Programme researcher, broadcasting/film/video in the Last Year:   . Barista in the Last Year:   Transportation Needs:   . Freight forwarder (Medical):   Marland Kitchen Lack of Transportation (Non-Medical):   Physical Activity:   . Days of Exercise per Week:   . Minutes of Exercise per Session:   Stress:   . Feeling of Stress :   Social Connections:   . Frequency of Communication with Friends and Family:   . Frequency of Social Gatherings with Friends and Family:   . Attends Religious Services:   . Active Member of Clubs or Organizations:   . Attends Banker Meetings:   Marland Kitchen Marital Status:   Intimate Partner Violence:   . Fear of Current or Ex-Partner:   .  Emotionally Abused:   Marland Kitchen Physically Abused:   . Sexually Abused:    Current Meds  Medication Sig  . amphetamine-dextroamphetamine (ADDERALL XR) 30 MG 24 hr capsule Take 1 capsule (30 mg total) by mouth daily.  . Cholecalciferol 1.25 MG (50000 UT) capsule Take 1 capsule (50,000 Units total) by mouth once a week.  . sertraline (ZOLOFT) 100 MG tablet TAKE 2 TABLETS(200 MG) BY MOUTH DAILY  . zolpidem (AMBIEN CR) 12.5 MG CR tablet Take 1 tablet (12.5 mg total) by mouth at bedtime as needed for sleep.   No Known Allergies Recent Results (from the past 2160 hour(s))  Vitamin D (25 hydroxy)     Status: Abnormal   Collection Time: 06/14/20  8:24 AM  Result Value Ref Range   VITD 19.56 (L) 30.00 - 100.00 ng/mL  TSH     Status: None   Collection Time: 06/14/20  8:24 AM  Result Value Ref Range   TSH 2.55 0.35 - 4.50 uIU/mL   Urinalysis, Routine w reflex microscopic     Status: Abnormal   Collection Time: 06/14/20  8:24 AM  Result Value Ref Range   Color, Urine YELLOW YELLOW   APPearance TURBID (A) CLEAR   Specific Gravity, Urine 1.020 1.001 - 1.03   pH 5.5 5.0 - 8.0   Glucose, UA NEGATIVE NEGATIVE   Bilirubin Urine NEGATIVE NEGATIVE   Ketones, ur NEGATIVE NEGATIVE   Hgb urine dipstick NEGATIVE NEGATIVE   Protein, ur NEGATIVE NEGATIVE   Nitrite NEGATIVE NEGATIVE   Leukocytes,Ua NEGATIVE NEGATIVE  Lipid panel     Status: Abnormal   Collection Time: 06/14/20  8:24 AM  Result Value Ref Range   Cholesterol 185 0 - 200 mg/dL    Comment: ATP III Classification       Desirable:  < 200 mg/dL               Borderline High:  200 - 239 mg/dL          High:  > = 497 mg/dL   Triglycerides 026.3 (H) 0 - 149 mg/dL    Comment: Normal:  <785 mg/dLBorderline High:  150 - 199 mg/dL   HDL 88.50 (L) >27.74 mg/dL   VLDL 12.8 (H) 0.0 - 78.6 mg/dL   Total CHOL/HDL Ratio 6     Comment:                Men          Women1/2 Average Risk     3.4          3.3Average Risk          5.0          4.42X Average Risk          9.6          7.13X Average Risk          15.0          11.0                       NonHDL 156.29     Comment: NOTE:  Non-HDL goal should be 30 mg/dL higher than patient's LDL goal (i.e. LDL goal of < 70 mg/dL, would have non-HDL goal of < 100 mg/dL)  Comprehensive metabolic panel     Status: Abnormal   Collection Time: 06/14/20  8:24 AM  Result Value Ref Range   Sodium 140 135 - 145 mEq/L   Potassium 4.0 3.5 -  5.1 mEq/L   Chloride 106 96 - 112 mEq/L   CO2 27 19 - 32 mEq/L   Glucose, Bld 110 (H) 70 - 99 mg/dL   BUN 9 6 - 23 mg/dL   Creatinine, Ser 1.610.76 0.40 - 1.50 mg/dL   Total Bilirubin 0.5 0.2 - 1.2 mg/dL   Alkaline Phosphatase 57 39 - 117 U/L   AST 26 0 - 37 U/L   ALT 33 0 - 53 U/L   Total Protein 7.1 6.0 - 8.3 g/dL   Albumin 4.5 3.5 - 5.2 g/dL   GFR 096.04115.63 >54.09>60.00 mL/min   Calcium 9.6 8.4 - 10.5 mg/dL   CBC with Differential/Platelet     Status: None   Collection Time: 06/14/20  8:24 AM  Result Value Ref Range   WBC 6.3 4.0 - 10.5 K/uL   RBC 4.71 4.22 - 5.81 Mil/uL   Hemoglobin 14.3 13.0 - 17.0 g/dL   HCT 81.141.6 39 - 52 %   MCV 88.3 78.0 - 100.0 fl   MCHC 34.4 30.0 - 36.0 g/dL   RDW 91.413.6 78.211.5 - 95.615.5 %   Platelets 261.0 150 - 400 K/uL   Neutrophils Relative % 65.1 43 - 77 %   Lymphocytes Relative 25.2 12 - 46 %   Monocytes Relative 7.0 3 - 12 %   Eosinophils Relative 1.9 0 - 5 %   Basophils Relative 0.8 0 - 3 %   Neutro Abs 4.1 1.4 - 7.7 K/uL   Lymphs Abs 1.6 0.7 - 4.0 K/uL   Monocytes Absolute 0.4 0 - 1 K/uL   Eosinophils Absolute 0.1 0 - 0 K/uL   Basophils Absolute 0.1 0 - 0 K/uL  Hemoglobin A1c     Status: None   Collection Time: 06/14/20  8:24 AM  Result Value Ref Range   Hgb A1c MFr Bld 5.8 4.6 - 6.5 %    Comment: Glycemic Control Guidelines for People with Diabetes:Non Diabetic:  <6%Goal of Therapy: <7%Additional Action Suggested:  >8%   LDL cholesterol, direct     Status: None   Collection Time: 06/14/20  8:24 AM  Result Value Ref Range   Direct LDL 131.0 mg/dL    Comment: Optimal:  <213<100 mg/dLNear or Above Optimal:  100-129 mg/dLBorderline High:  130-159 mg/dLHigh:  160-189 mg/dLVery High:  >190 mg/dL   Objective  Body mass index is 39.85 kg/m. Wt Readings from Last 3 Encounters:  07/13/20 (!) 310 lb 6.4 oz (140.8 kg)  01/13/20 300 lb (136.1 kg)  09/08/19 (!) 317 lb 6.4 oz (144 kg)   Temp Readings from Last 3 Encounters:  07/13/20 (!) 97.5 F (36.4 C) (Oral)  09/08/19 98.3 F (36.8 C) (Temporal)   BP Readings from Last 3 Encounters:  07/13/20 120/82  09/08/19 132/78   Pulse Readings from Last 3 Encounters:  07/13/20 88  09/08/19 86    Physical Exam Vitals and nursing note reviewed.  Constitutional:      Appearance: Normal appearance. He is well-developed and well-groomed. He is obese.  HENT:     Head: Normocephalic and atraumatic.  Eyes:      Conjunctiva/sclera: Conjunctivae normal.     Pupils: Pupils are equal, round, and reactive to light.  Cardiovascular:     Rate and Rhythm: Normal rate and regular rhythm.     Heart sounds: Normal heart sounds. No murmur heard.   Pulmonary:     Effort: Pulmonary effort is normal.     Breath sounds: Normal breath sounds.  Skin:  General: Skin is warm and dry.  Neurological:     General: No focal deficit present.     Mental Status: He is alert and oriented to person, place, and time. Mental status is at baseline.     Gait: Gait normal.  Psychiatric:        Attention and Perception: Attention and perception normal.        Mood and Affect: Mood and affect normal.        Speech: Speech normal.        Behavior: Behavior normal. Behavior is cooperative.        Thought Content: Thought content normal.        Cognition and Memory: Cognition and memory normal.        Judgment: Judgment normal.     Assessment  Plan    Prediabetes - Plan: Hemoglobin A1c Obesity  HLD rec healthy diet and exercise  Considering wt loss surgery in future let me know if wants referral to mds mentioned   Vitamin D deficiency - Plan: Vitamin D (25 hydroxy) Weekly then 4K to 5K qd iu otc   Reduced vision Let me know who wants to see as far as eye doc given list check with insurance  Obesity (BMI 30-39.9)  Anxiety and depression   HM-CPE at f/u Flu shot utd Tdap rec in future 05/2020  Covid 19 vx declines 07/13/20  Will need to exercise to lose wt rec healthy diet and exercise Acne to back some better with benzyol peroxide wash   Former PCP Dr. Dewaine Oats Provider: Dr. French Ana McLean-Scocuzza-Internal Medicine

## 2020-07-23 DIAGNOSIS — H5213 Myopia, bilateral: Secondary | ICD-10-CM | POA: Diagnosis not present

## 2020-08-03 ENCOUNTER — Telehealth (INDEPENDENT_AMBULATORY_CARE_PROVIDER_SITE_OTHER): Payer: BC Managed Care – PPO | Admitting: Psychiatry

## 2020-08-03 ENCOUNTER — Other Ambulatory Visit: Payer: Self-pay

## 2020-08-03 DIAGNOSIS — F330 Major depressive disorder, recurrent, mild: Secondary | ICD-10-CM | POA: Diagnosis not present

## 2020-08-03 DIAGNOSIS — F411 Generalized anxiety disorder: Secondary | ICD-10-CM

## 2020-08-03 DIAGNOSIS — F900 Attention-deficit hyperactivity disorder, predominantly inattentive type: Secondary | ICD-10-CM | POA: Diagnosis not present

## 2020-08-03 MED ORDER — ZOLPIDEM TARTRATE ER 12.5 MG PO TBCR: 12.5000 mg | EXTENDED_RELEASE_TABLET | Freq: Every evening | ORAL | 2 refills | Status: DC | PRN

## 2020-08-03 MED ORDER — DULOXETINE HCL 30 MG PO CPEP: ORAL_CAPSULE | ORAL | 0 refills | Status: DC

## 2020-08-03 MED ORDER — AMPHETAMINE-DEXTROAMPHET ER 30 MG PO CP24
30.0000 mg | ORAL_CAPSULE | ORAL | 0 refills | Status: DC
Start: 2020-10-09 — End: 2020-11-15

## 2020-08-03 MED ORDER — AMPHETAMINE-DEXTROAMPHET ER 30 MG PO CP24
30.0000 mg | ORAL_CAPSULE | ORAL | 0 refills | Status: DC
Start: 2020-08-09 — End: 2020-11-15

## 2020-08-03 MED ORDER — AMPHETAMINE-DEXTROAMPHET ER 30 MG PO CP24
30.0000 mg | ORAL_CAPSULE | ORAL | 0 refills | Status: DC
Start: 2020-09-09 — End: 2020-11-15

## 2020-08-03 NOTE — Progress Notes (Signed)
BH MD/PA/NP OP Progress Note  08/03/2020 3:49 PM Cory Gomez  MRN:  [id number] Interview was conducted by phone and I verified that I was speaking with the correct person using two identifiers. I discussed the limitations of evaluation and management by telemedicine and  the availability of in person appointments. Patient expressed understanding and agreed to proceed. Patient location - home; physician - home office.  Chief Complaint: More anxiety and depression.  HPI: 39year old married male who presents with chronic depression and anxiety.In addition to depression/anxiety he also admits to binge eating - feels that when stressed out he overeats. He reports marital conflict and difficulty with communication being likely a trigger for current depression. He has been married two years after knowing his wife for just 3 months prior. He was previously married for 2.5 years but after he found his ex wife cheating on him he got divorced. Heremarried within 7 months. Sleep improved and he is only sporadically needing to takeAmbien CR. While in school he was diagnosed with having ADHD inattentive type and was on Ritalin.We have started Adderall XR 25 mg (Vyvanse is prohibitively priced for him) and he noticed some improvement.The dose has since been increased to 30 mg.Zoloft was added for depression/anxiety - he tolerates it well- we increased dosegradually to 200 mg. He noticed improvement in moodinitially but over past 1-2 months he noticed that mood started to slip back into depression.  Visit Diagnosis:    ICD-10-CM   1. GAD (generalized anxiety disorder)  F41.1   2. Attention deficit hyperactivity disorder (ADHD), predominantly inattentive type  F90.0   3. Major depressive disorder, recurrent episode, mild (HCC)  F33.0     Past Psychiatric History: Please see intake H&P.  Past Medical History:  Past Medical History:  Diagnosis Date  . ADHD (attention deficit hyperactivity  disorder)   . Anxiety and depression   . COVID-19    12/28/19  . Insomnia   . Obesity (BMI 30-39.9)   . Prediabetes     Past Surgical History:  Procedure Laterality Date  . NO PAST SURGERIES      Family Psychiatric History: Reviewed.  Family History:  Family History  Problem Relation Age of Onset  . Hypertension Mother   . Hyperlipidemia Mother   . Diabetes Mellitus II Mother   . Anxiety disorder Mother   . Depression Mother   . Anxiety disorder Brother   . Depression Brother   . Hypertension Brother   . Hyperlipidemia Brother   . Diabetes type II Brother   . ADD / ADHD Brother   . Dementia Maternal Grandfather     Social History:  Social History   Socioeconomic History  . Marital status: Married    Spouse name: Not on file  . Number of children: Not on file  . Years of education: Not on file  . Highest education level: Not on file  Occupational History  . Not on file  Tobacco Use  . Smoking status: Never Smoker  . Smokeless tobacco: Never Used  Substance and Sexual Activity  . Alcohol use: Not Currently  . Drug use: Not Currently  . Sexual activity: Yes    Partners: Female  Other Topics Concern  . Not on file  Social History Narrative   Married no kids wife is Cory Gomez    -wife and [name] on [place]      Has siblings    Works Educational psychologist IT    1 dog  Social Determinants of Health   Financial Resource Strain:   . Difficulty of Paying Living Expenses:   Food Insecurity:   . Worried About Programme researcher, broadcasting/film/video in the Last Year:   . Barista in the Last Year:   Transportation Needs:   . Freight forwarder (Medical):   Marland Kitchen Lack of Transportation (Non-Medical):   Physical Activity:   . Days of Exercise per Week:   . Minutes of Exercise per Session:   Stress:   . Feeling of Stress :   Social Connections:   . Frequency of Communication with Friends and Family:   . Frequency of Social Gatherings with Friends and Family:   . Attends  Religious Services:   . Active Member of Clubs or Organizations:   . Attends Banker Meetings:   Marland Kitchen Marital Status:     Allergies: No Known Allergies  Metabolic Disorder Labs: Lab Results  Component Value Date   HGBA1C 5.8 06/14/2020   No results found for: PROLACTIN Lab Results  Component Value Date   CHOL 185 06/14/2020   TRIG 215.0 (H) 06/14/2020   HDL 29.10 (L) 06/14/2020   CHOLHDL 6 06/14/2020   VLDL 43.0 (H) 06/14/2020   LDLCALC 102 (H) 06/11/2019   Lab Results  Component Value Date   TSH 2.55 06/14/2020   TSH 3.38 06/11/2019    Therapeutic Level Labs: No results found for: LITHIUM No results found for: VALPROATE No components found for:  CBMZ  Current Medications: Current Outpatient Medications  Medication Sig Dispense Refill  . amphetamine-dextroamphetamine (ADDERALL XR) 30 MG 24 hr capsule Take 1 capsule (30 mg total) by mouth daily. 30 capsule 0  . [START ON 08/09/2020] amphetamine-dextroamphetamine (ADDERALL XR) 30 MG 24 hr capsule Take 1 capsule (30 mg total) by mouth every morning. 30 capsule 0  . [START ON 09/09/2020] amphetamine-dextroamphetamine (ADDERALL XR) 30 MG 24 hr capsule Take 1 capsule (30 mg total) by mouth every morning. 30 capsule 0  . [START ON 10/09/2020] amphetamine-dextroamphetamine (ADDERALL XR) 30 MG 24 hr capsule Take 1 capsule (30 mg total) by mouth every morning. 30 capsule 0  . Benzoyl Peroxide 10 % LIQD Apply 1 Bottle topically daily as needed. Acne Be careful may bleach clothing (Patient not taking: Reported on 07/13/2020) 160 g 11  . Cholecalciferol 1.25 MG (50000 UT) capsule Take 1 capsule (50,000 Units total) by mouth once a week. 13 capsule 1  . DULoxetine (CYMBALTA) 30 MG capsule Take 1 capsule (30 mg total) by mouth daily for 7 days, THEN 2 capsules (60 mg total) daily. 127 capsule 0  . zolpidem (AMBIEN CR) 12.5 MG CR tablet Take 1 tablet (12.5 mg total) by mouth at bedtime as needed for sleep. 30 tablet 2   No  current facility-administered medications for this visit.      Psychiatric Specialty Exam: Review of Systems  Psychiatric/Behavioral: The patient is nervous/anxious.   All other systems reviewed and are negative.   There were no vitals taken for this visit.There is no height or weight on file to calculate BMI.  General Appearance: NA  Eye Contact:  NA  Speech:  Clear and Coherent and Normal Rate  Volume:  Normal  Mood:  Anxious and Depressed  Affect:  NA  Thought Process:  Goal Directed  Orientation:  Full (Time, Place, and Person)  Thought Content: Rumination   Suicidal Thoughts:  No  Homicidal Thoughts:  No  Memory:  Immediate;   Good Recent;  Good Remote;   Good  Judgement:  Good  Insight:  Fair  Psychomotor Activity:  NA  Concentration:  Concentration: Good  Recall:  Good  Fund of Knowledge: Good  Language: Good  Akathisia:  Negative  Handed:  Right  AIMS (if indicated): not done  Assets:  Communication Skills Desire for Improvement Financial Resources/Insurance Housing Talents/Skills  ADL's:  Intact  Cognition: WNL  Sleep:  Fair   Screenings: GAD-7     Office Visit from 09/08/2019 in [facility] [place]  Total GAD-7 Score 20    PHQ2-9     Office Visit from 01/13/2020 in [facility] Office Visit from 09/08/2019 in [facility] Office Visit from 06/02/2019 in [facility]  PHQ-2 Total Score 0 6 6  PHQ-9 Total Score -- 26 19       Assessment and Plan: 39year old married male who presents with chronic depression and anxiety.In addition to depression/anxiety he also admits to binge eating - feels that when stressed out he overeats. He reports marital conflict and difficulty with communication being likely a trigger for current depression. He has been married two years after knowing his wife for just 3 months prior. He was previously married for 2.5 years but after he found his ex wife cheating  on him he got divorced. Heremarried within 7 months. Sleep improved and he is only sporadically needing to takeAmbien CR. While in school he was diagnosed with having ADHD inattentive type and was on Ritalin.We have started Adderall XR 25 mg (Vyvanse is prohibitively priced for him) and he noticed some improvement.The dose has since been increased to 30 mg.Zoloft was added for depression/anxiety - he tolerates it well- we increased dosegradually to 200 mg. He noticed improvement in moodinitially but over past 1-2 months he noticed that mood started to slip back into depression.  Dx: Major depressive disorder recurrentmild; GAD; ADD; Binge eating disorder  Plan:Decrease sertraline to100 mg for one week and start 30 mg of duloxetine at the same time. In one week stop  Sertraline and double dose of duloxetine. Continuezolpidem CRas needed for insomnia and Adderall XR 30 mg daily.Next appointment with me in5 weeks.The plan was discussed with patient who had an opportunity to ask questions and these were all answered. I spend50minutes inphone consultationwith the patient.    Magdalene Patricia, MD 08/03/2020, 3:49 PM

## 2020-09-13 ENCOUNTER — Other Ambulatory Visit: Payer: Self-pay

## 2020-09-13 ENCOUNTER — Telehealth (INDEPENDENT_AMBULATORY_CARE_PROVIDER_SITE_OTHER): Payer: BC Managed Care – PPO | Admitting: Psychiatry

## 2020-09-13 DIAGNOSIS — F411 Generalized anxiety disorder: Secondary | ICD-10-CM

## 2020-09-13 DIAGNOSIS — F330 Major depressive disorder, recurrent, mild: Secondary | ICD-10-CM | POA: Diagnosis not present

## 2020-09-13 DIAGNOSIS — F5081 Binge eating disorder: Secondary | ICD-10-CM

## 2020-09-13 DIAGNOSIS — F900 Attention-deficit hyperactivity disorder, predominantly inattentive type: Secondary | ICD-10-CM | POA: Diagnosis not present

## 2020-09-13 MED ORDER — DULOXETINE HCL 60 MG PO CPEP: 60.0000 mg | ORAL_CAPSULE | Freq: Every day | ORAL | 0 refills | Status: DC

## 2020-09-13 NOTE — Progress Notes (Signed)
BH MD/PA/NP OP Progress Note  09/13/2020 3:40 PM Cory Gomez  MRN:  [id number] Interview was conducted by phone and I verified that I was speaking with the correct person using two identifiers. I discussed the limitations of evaluation and management by telemedicine and  the availability of in person appointments. Patient expressed understanding and agreed to proceed. Patient location - home; physician - home office.  Chief Complaint: "I feel better".  HPI: 39year old married male who presents with chronic depression and anxiety.In addition to depression/anxiety he also admits to binge eating - feels that when stressed out he overeats. He reports marital conflict and difficulty with communication being likely a trigger for current depression. He has been married two years after knowing his wife for just 3 months prior. He was previously married for 2.5 years but after he found his ex wife cheating on him he got divorced. Heremarried within 7 months. Sleep improved and he is only sporadically needing to takeAmbien CR. While in school he was diagnosed with having ADHD inattentive type and was on Ritalin.We have started Adderall XR 25 mg (Vyvanse is prohibitively priced for him) and he noticed some improvement.The dose has since been increased to 30 mg.Zoloft was added for depression/anxiety - he tolerates it well- we increased dosegradually to 200 mg. He noticed improvement in moodinitially but over past 1-2 months he noticed that mood started to slip back into depression.We have cross tapered sertraline toduloxetine. He has now been on duloxetine 60 mg for 3 weeks and mood has improved (tolerates it well).    Visit Diagnosis:    ICD-10-CM   1. Major depressive disorder, recurrent episode, mild (HCC)  F33.0   2. GAD (generalized anxiety disorder)  F41.1   3. Attention deficit hyperactivity disorder (ADHD), predominantly inattentive type  F90.0   4. Binge eating disorder  F50.81      Past Psychiatric History: Please see intake H&P.  Past Medical History:  Past Medical History:  Diagnosis Date  . ADHD (attention deficit hyperactivity disorder)   . Anxiety and depression   . COVID-19    12/28/19  . Insomnia   . Obesity (BMI 30-39.9)   . Prediabetes     Past Surgical History:  Procedure Laterality Date  . NO PAST SURGERIES      Family Psychiatric History: Reviewed.  Family History:  Family History  Problem Relation Age of Onset  . Hypertension Mother   . Hyperlipidemia Mother   . Diabetes Mellitus II Mother   . Anxiety disorder Mother   . Depression Mother   . Anxiety disorder Brother   . Depression Brother   . Hypertension Brother   . Hyperlipidemia Brother   . Diabetes type II Brother   . ADD / ADHD Brother   . Dementia Maternal Grandfather     Social History:  Social History   Socioeconomic History  . Marital status: Married    Spouse name: Not on file  . Number of children: Not on file  . Years of education: Not on file  . Highest education level: Not on file  Occupational History  . Not on file  Tobacco Use  . Smoking status: Never Smoker  . Smokeless tobacco: Never Used  Substance and Sexual Activity  . Alcohol use: Not Currently  . Drug use: Not Currently  . Sexual activity: Yes    Partners: Female  Other Topics Concern  . Not on file  Social History Narrative   Married no kids wife  is [name]    -wife and mother [name] on [place]      Has siblings    Works Educational psychologist IT    1 dog    Social Determinants of Corporate investment banker Strain:   . Difficulty of Paying Living Expenses: Not on file  Food Insecurity:   . Worried About Programme researcher, broadcasting/film/video in the Last Year: Not on file  . Ran Out of Food in the Last Year: Not on file  Transportation Needs:   . Lack of Transportation (Medical): Not on file  . Lack of Transportation (Non-Medical): Not on file  Physical Activity:   . Days of Exercise per Week: Not on  file  . Minutes of Exercise per Session: Not on file  Stress:   . Feeling of Stress : Not on file  Social Connections:   . Frequency of Communication with Friends and Family: Not on file  . Frequency of Social Gatherings with Friends and Family: Not on file  . Attends Religious Services: Not on file  . Active Member of Clubs or Organizations: Not on file  . Attends Banker Meetings: Not on file  . Marital Status: Not on file    Allergies: No Known Allergies  Metabolic Disorder Labs: Lab Results  Component Value Date   HGBA1C 5.8 06/14/2020   No results found for: PROLACTIN Lab Results  Component Value Date   CHOL 185 06/14/2020   TRIG 215.0 (H) 06/14/2020   HDL 29.10 (L) 06/14/2020   CHOLHDL 6 06/14/2020   VLDL 43.0 (H) 06/14/2020   LDLCALC 102 (H) 06/11/2019   Lab Results  Component Value Date   TSH 2.55 06/14/2020   TSH 3.38 06/11/2019    Therapeutic Level Labs: No results found for: LITHIUM No results found for: VALPROATE No components found for:  CBMZ  Current Medications: Current Outpatient Medications  Medication Sig Dispense Refill  . amphetamine-dextroamphetamine (ADDERALL XR) 30 MG 24 hr capsule Take 1 capsule (30 mg total) by mouth daily. 30 capsule 0  . amphetamine-dextroamphetamine (ADDERALL XR) 30 MG 24 hr capsule Take 1 capsule (30 mg total) by mouth every morning. 30 capsule 0  . amphetamine-dextroamphetamine (ADDERALL XR) 30 MG 24 hr capsule Take 1 capsule (30 mg total) by mouth every morning. 30 capsule 0  . [START ON 10/09/2020] amphetamine-dextroamphetamine (ADDERALL XR) 30 MG 24 hr capsule Take 1 capsule (30 mg total) by mouth every morning. 30 capsule 0  . Benzoyl Peroxide 10 % LIQD Apply 1 Bottle topically daily as needed. Acne Be careful may bleach clothing (Patient not taking: Reported on 07/13/2020) 160 g 11  . Cholecalciferol 1.25 MG (50000 UT) capsule Take 1 capsule (50,000 Units total) by mouth once a week. 13 capsule 1  .  DULoxetine (CYMBALTA) 60 MG capsule Take 1 capsule (60 mg total) by mouth daily. 90 capsule 0  . zolpidem (AMBIEN CR) 12.5 MG CR tablet Take 1 tablet (12.5 mg total) by mouth at bedtime as needed for sleep. 30 tablet 2   No current facility-administered medications for this visit.      Psychiatric Specialty Exam: Review of Systems  All other systems reviewed and are negative.   There were no vitals taken for this visit.There is no height or weight on file to calculate BMI.  General Appearance: NA  Eye Contact:  NA  Speech:  Clear and Coherent and Normal Rate  Volume:  Normal  Mood:  "Better".  Affect:  NA  Thought Process:  Goal Directed and Linear  Orientation:  Full (Time, Place, and Person)  Thought Content: Logical   Suicidal Thoughts:  No  Homicidal Thoughts:  No  Memory:  Immediate;   Good Recent;   Good Remote;   Good  Judgement:  Good  Insight:  Good  Psychomotor Activity:  NA  Concentration:  Concentration: Good  Recall:  Good  Fund of Knowledge: Good  Language: Good  Akathisia:  Negative  Handed:  Right  AIMS (if indicated): not done  Assets:  Communication Skills Desire for Improvement Financial Resources/Insurance Housing Talents/Skills  ADL's:  Intact  Cognition: WNL  Sleep:  Good   Screenings: GAD-7     Office Visit from 09/08/2019 in [facility]  Total GAD-7 Score 20    PHQ2-9     Office Visit from 01/13/2020 in [facility] Office Visit from 09/08/2019 in [facility] Office Visit from 06/02/2019 in [facility]  PHQ-2 Total Score 0 6 6  PHQ-9 Total Score -- 26 19       Assessment and Plan: 39year old married male who presents with chronic depression and anxiety.In addition to depression/anxiety he also admits to binge eating - feels that when stressed out he overeats. He reports marital conflict and difficulty with communication being likely a trigger for current  depression. He has been married two years after knowing his wife for just 3 months prior. He was previously married for 2.5 years but after he found his ex wife cheating on him he got divorced. Heremarried within 7 months. Sleep improved and he is only sporadically needing to takeAmbien CR. While in school he was diagnosed with having ADHD inattentive type and was on Ritalin.We have started Adderall XR 25 mg (Vyvanse is prohibitively priced for him) and he noticed some improvement.The dose has since been increased to 30 mg.Zoloft was added for depression/anxiety - he tolerates it well- we increased dosegradually to 200 mg. He noticed improvement in moodinitially but over past 1-2 months he noticed that mood started to slip back into depression.We have cross tapered sertraline toduloxetine. He has now been on duloxetine 60 mg for 3 weeks and mood has improved (tolerates it well).   Dx: Major depressive disorder recurrentmild; GAD; ADD; Binge eating disorder  Plan: Continueduloxetine 60 mg, zolpidem CRas needed for insomnia and Adderall XR 30 mg daily.Next appointment with me in2 months.The plan was discussed with patient who had an opportunity to ask questions and these were all answered. I spend64minutes inphone consultationwith the patient.    Magdalene Patricia, MD 09/13/2020, 3:40 PM

## 2020-10-15 ENCOUNTER — Other Ambulatory Visit (HOSPITAL_COMMUNITY): Payer: Self-pay | Admitting: Psychiatry

## 2020-11-15 ENCOUNTER — Telehealth (INDEPENDENT_AMBULATORY_CARE_PROVIDER_SITE_OTHER): Payer: BC Managed Care – PPO | Admitting: Psychiatry

## 2020-11-15 ENCOUNTER — Other Ambulatory Visit: Payer: Self-pay

## 2020-11-15 DIAGNOSIS — F900 Attention-deficit hyperactivity disorder, predominantly inattentive type: Secondary | ICD-10-CM

## 2020-11-15 DIAGNOSIS — F5081 Binge eating disorder: Secondary | ICD-10-CM

## 2020-11-15 DIAGNOSIS — F411 Generalized anxiety disorder: Secondary | ICD-10-CM

## 2020-11-15 DIAGNOSIS — F330 Major depressive disorder, recurrent, mild: Secondary | ICD-10-CM | POA: Diagnosis not present

## 2020-11-15 MED ORDER — ZOLPIDEM TARTRATE ER 12.5 MG PO TBCR: 12.5000 mg | EXTENDED_RELEASE_TABLET | Freq: Every evening | ORAL | 2 refills | Status: DC | PRN

## 2020-11-15 MED ORDER — TOPIRAMATE 25 MG PO TABS: ORAL_TABLET | ORAL | 0 refills | Status: DC

## 2020-11-15 MED ORDER — DULOXETINE HCL 60 MG PO CPEP: 60.0000 mg | ORAL_CAPSULE | Freq: Every day | ORAL | 1 refills | Status: DC

## 2020-11-15 MED ORDER — AMPHETAMINE-DEXTROAMPHET ER 30 MG PO CP24
30.0000 mg | ORAL_CAPSULE | ORAL | 0 refills | Status: DC
Start: 2020-11-15 — End: 2021-01-17

## 2020-11-15 MED ORDER — AMPHETAMINE-DEXTROAMPHET ER 30 MG PO CP24
30.0000 mg | ORAL_CAPSULE | ORAL | 0 refills | Status: DC
Start: 2020-12-15 — End: 2021-01-17

## 2020-11-15 MED ORDER — AMPHETAMINE-DEXTROAMPHET ER 30 MG PO CP24
30.0000 mg | ORAL_CAPSULE | ORAL | 0 refills | Status: DC
Start: 2021-01-14 — End: 2021-03-16

## 2020-11-15 NOTE — Progress Notes (Signed)
BH MD/PA/NP OP Progress Note  11/15/2020 9:16 AM Cory Gomez  MRN:  [id number] Interview was conducted by phone and I verified that I was speaking with the correct person using two identifiers. I discussed the limitations of evaluation and management by telemedicine and  the availability of in person appointments. Patient expressed understanding and agreed to proceed. Patient location - home; physician - home office.  Chief Complaint: Stress increased, insomnia, overeating.  HPI: 39year old married male who presents with chronic depressionand anxiety.In addition to depression/anxiety he also admits to binge eating - feels that when stressed out he overeats. He reports marital conflict and difficulty with communication being likely a trigger for current depression. 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 He was previously married for 2.5 years but after he found his ex wife cheating on him he got divorced. Heremarried within 7 months. Sleep initially improved but marital stress increased recently and he again needs to take zolpidem nightly. He also started to binge eat again. While in school he was diagnosed with having ADHD inattentive type and was on Ritalin.We have started Adderall XR 25 mg (Vyvanse is prohibitively priced for him) and he noticed some improvement.The dose has since been increased to 30 mg.Zoloft was added for depression/anxiety - he tolerates it well- we increased dosegradually to 200 mg. After initial improvement in moodhe noticed that mood started to decline. We have cross tapered sertraline toduloxetine. He has now been on duloxetine 60 mg for 3 months (tolerates it well).    Visit Diagnosis:    ICD-10-CM   1. Attention deficit hyperactivity disorder (ADHD), predominantly inattentive type  F90.0   2. Binge eating disorder  F50.81   3. GAD (generalized anxiety disorder)  F41.1   4. Major depressive disorder, recurrent  episode, mild (HCC)  F33.0     Past Psychiatric History: Please see intake H&P>  Past Medical History:  Past Medical History:  Diagnosis Date  . ADHD (attention deficit hyperactivity disorder)   . Anxiety and depression   . COVID-19    12/28/19  . Insomnia   . Obesity (BMI 30-39.9)   . Prediabetes     Past Surgical History:  Procedure Laterality Date  . NO PAST SURGERIES      Family Psychiatric History: Reviewed.  Family History:  Family History  Problem Relation Age of Onset  . Hypertension Mother   . Hyperlipidemia Mother   . Diabetes Mellitus II Mother   . Anxiety disorder Mother   . Depression Mother   . Anxiety disorder Brother   . Depression Brother   . Hypertension Brother   . Hyperlipidemia Brother   . Diabetes type II Brother   . ADD / ADHD Brother   . Dementia Maternal Grandfather     Social History:  Social History   Socioeconomic History  . Marital status: Married    Spouse name: Not on file  . Number of children: Not on file  . Years of education: Not on file  . Highest education level: Not on file  Occupational History  . Not on file  Tobacco Use  . Smoking status: Never Smoker  . Smokeless tobacco: Never Used  Substance and Sexual Activity  . Alcohol use: Not Currently  . Drug use: Not Currently  . Sexual activity: Yes    Partners: Female  Other Topics Concern  . Not on file  Social History Narrative   Married no kids wife is French Ana  Kolenovic    -wife and mother [name] on [place]      Has siblings    Works Educational psychologist IT    1 dog    Social Determinants of Corporate investment banker Strain:   . Difficulty of Paying Living Expenses: Not on file  Food Insecurity:   . Worried About Programme researcher, broadcasting/film/video in the Last Year: Not on file  . Ran Out of Food in the Last Year: Not on file  Transportation Needs:   . Lack of Transportation (Medical): Not on file  . Lack of Transportation (Non-Medical): Not on file  Physical Activity:   . Days of  Exercise per Week: Not on file  . Minutes of Exercise per Session: Not on file  Stress:   . Feeling of Stress : Not on file  Social Connections:   . Frequency of Communication with Friends and Family: Not on file  . Frequency of Social Gatherings with Friends and Family: Not on file  . Attends Religious Services: Not on file  . Active Member of Clubs or Organizations: Not on file  . Attends Banker Meetings: Not on file  . Marital Status: Not on file    Allergies: No Known Allergies  Metabolic Disorder Labs: Lab Results  Component Value Date   HGBA1C 5.8 06/14/2020   No results found for: PROLACTIN Lab Results  Component Value Date   CHOL 185 06/14/2020   TRIG 215.0 (H) 06/14/2020   HDL 29.10 (L) 06/14/2020   CHOLHDL 6 06/14/2020   VLDL 43.0 (H) 06/14/2020   LDLCALC 102 (H) 06/11/2019   Lab Results  Component Value Date   TSH 2.55 06/14/2020   TSH 3.38 06/11/2019    Therapeutic Level Labs: No results found for: LITHIUM No results found for: VALPROATE No components found for:  CBMZ  Current Medications: Current Outpatient Medications  Medication Sig Dispense Refill  . amphetamine-dextroamphetamine (ADDERALL XR) 30 MG 24 hr capsule Take 1 capsule (30 mg total) by mouth every morning. 30 capsule 0  . [START ON 12/15/2020] amphetamine-dextroamphetamine (ADDERALL XR) 30 MG 24 hr capsule Take 1 capsule (30 mg total) by mouth every morning. 30 capsule 0  . [START ON 01/14/2021] amphetamine-dextroamphetamine (ADDERALL XR) 30 MG 24 hr capsule Take 1 capsule (30 mg total) by mouth every morning. 30 capsule 0  . Benzoyl Peroxide 10 % LIQD Apply 1 Bottle topically daily as needed. Acne Be careful may bleach clothing (Patient not taking: Reported on 07/13/2020) 160 g 11  . Cholecalciferol 1.25 MG (50000 UT) capsule Take 1 capsule (50,000 Units total) by mouth once a week. 13 capsule 1  . [START ON 12/12/2020] DULoxetine (CYMBALTA) 60 MG capsule Take 1 capsule (60 mg  total) by mouth daily. 90 capsule 1  . topiramate (TOPAMAX) 25 MG tablet Take 1 tablet (25 mg total) by mouth 2 (two) times daily for 7 days, THEN 2 tablets (50 mg total) 2 (two) times daily. 254 tablet 0  . zolpidem (AMBIEN CR) 12.5 MG CR tablet Take 1 tablet (12.5 mg total) by mouth at bedtime as needed for sleep. 30 tablet 2   No current facility-administered medications for this visit.     Psychiatric Specialty Exam: Review of Systems  Constitutional: Positive for appetite change.  Psychiatric/Behavioral: Positive for sleep disturbance. The patient is nervous/anxious.   All other systems reviewed and are negative.   There were no vitals taken for this visit.There is no height or weight on file  to calculate BMI.  General Appearance: NA  Eye Contact:  NA  Speech:  Clear and Coherent and Normal Rate  Volume:  Normal  Mood:  Anxious and Depressed  Affect:  NA  Thought Process:  Goal Directed  Orientation:  Full (Time, Place, and Person)  Thought Content: Rumination   Suicidal Thoughts:  No  Homicidal Thoughts:  No  Memory:  Immediate;   Good Recent;   Good Remote;   Good  Judgement:  Fair  Insight:  Good  Psychomotor Activity:  NA  Concentration:  Concentration: Good  Recall:  Good  Fund of Knowledge: Good  Language: Good  Akathisia:  Negative  Handed:  Right  AIMS (if indicated): not done  Assets:  Communication Skills Desire for Improvement Financial Resources/Insurance Housing Resilience Talents/Skills  ADL's:  Intact  Cognition: WNL  Sleep:  Fair   Screenings: GAD-7     Office Visit from 09/08/2019 in [facility]  Total GAD-7 Score 20    PHQ2-9     Office Visit from 01/13/2020 in [facility] Office Visit from 09/08/2019 in [facility] Office Visit from 06/02/2019 in [facility]   PHQ-2 Total Score 0 6 6  PHQ-9 Total Score -- 26 19       Assessment and Plan: 39year old married  male who presents with chronic depressionand anxiety.In addition to depression/anxiety he also admits to binge eating - feels that when stressed out he overeats. He reports marital conflict and difficulty with communication being likely a trigger for current depression. 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 He was previously married for 2.5 years but after he found his ex wife cheating on him he got divorced. Heremarried within 7 months. Sleep initially improved but marital stress increased recently and he again needs to take zolpidem nightly. He also started to binge eat again. While in school he was diagnosed with having ADHD inattentive type and was on Ritalin.We have started Adderall XR 25 mg (Vyvanse is prohibitively priced for him) and he noticed some improvement.The dose has since been increased to 30 mg.Zoloft was added for depression/anxiety - he tolerates it well- we increased dosegradually to 200 mg. After initial improvement in moodhe noticed that mood started to decline. We have cross tapered sertraline toduloxetine. He has now been on duloxetine 60 mg for 3 months (tolerates it well).   Dx: Major depressive disorder recurrentmild; GAD; ADD; Binge eating disorder  Plan: Continueduloxetine 60 mg, zolpidem CRas needed for insomniaand Adderall XR 30 mgdaily.I will add topiramate  25 mg (then 50 mg) bid for binge eating disorder. Vikash will call office to restart therapy (with Bynum Bellows).Next appointment with me in2 months.The plan was discussed with patient who had an opportunity to ask questions and these were all answered. I spend74minutes inphone consultationwith the patient.    Magdalene Patricia, MD 11/15/2020, 9:16 AM

## 2021-01-17 ENCOUNTER — Other Ambulatory Visit: Payer: Self-pay

## 2021-01-17 ENCOUNTER — Telehealth (INDEPENDENT_AMBULATORY_CARE_PROVIDER_SITE_OTHER): Payer: BC Managed Care – PPO | Admitting: Psychiatry

## 2021-01-17 DIAGNOSIS — F900 Attention-deficit hyperactivity disorder, predominantly inattentive type: Secondary | ICD-10-CM

## 2021-01-17 DIAGNOSIS — F330 Major depressive disorder, recurrent, mild: Secondary | ICD-10-CM | POA: Diagnosis not present

## 2021-01-17 DIAGNOSIS — F411 Generalized anxiety disorder: Secondary | ICD-10-CM | POA: Diagnosis not present

## 2021-01-17 DIAGNOSIS — F5081 Binge eating disorder: Secondary | ICD-10-CM | POA: Diagnosis not present

## 2021-01-17 MED ORDER — AMPHETAMINE-DEXTROAMPHET ER 30 MG PO CP24: 30.0000 mg | ORAL_CAPSULE | ORAL | 0 refills | Status: DC

## 2021-01-17 NOTE — Progress Notes (Signed)
BH MD/PA/NP OP Progress Note  01/17/2021 9:10 AM Cory Gomez  MRN:  [id number] Interview was conducted by phone and I verified that I was speaking with the correct person using two identifiers. I discussed the limitations of evaluation and management by telemedicine and  the availability of in person appointments. Patient expressed understanding and agreed to proceed. Participants in the visit: patient (location - home); physician (location - home office).  Chief Complaint: "I feel better".  HPI: 40year old married male who presents with chronic depressionand anxiety.In addition to depression/anxiety he also admits to binge eating - feels that when stressed out he overeats. He reports marital conflict and difficulty with communication being likely a trigger for current depression. 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 He was previously married for 2.5 years but after he found his ex wife cheating on him he got divorced. Heremarried within 7 months. Sleep initially improved but marital stress increased recently and he again needs to take zolpidem nightly. He also started to binge eat again. While in school he was diagnosed with having ADHD inattentive type and was on Ritalin.We have started Adderall XR 25 mg (Vyvanse is prohibitively priced for him) and he noticed some improvement.The dose has since been increased to 30 mg.Zoloft was added for depression/anxiety - he tolerated it well- we increased dosegradually to 200 mg. After initial improvement in moodhe noticed that mood started to decline. We have cross taperedsertraline toduloxetine. He has now been on duloxetine 60 mg for about 6 months (tolerates it well) and finds it helpful.He takes zolpidem as needed for insomnia. We also tried topiramate for binge eating but he did not tolerate it well and stopped after just one week. Binge eating subsided spontaneously.    Visit Diagnosis:     ICD-10-CM   1. GAD (generalized anxiety disorder)  F41.1   2. Attention deficit hyperactivity disorder (ADHD), predominantly inattentive type  F90.0   3. Major depressive disorder, recurrent episode, mild (HCC)  F33.0   4. Binge eating disorder  F50.81     Past Psychiatric History: Please see intake H&P.  Past Medical History:  Past Medical History:  Diagnosis Date  . ADHD (attention deficit hyperactivity disorder)   . Anxiety and depression   . COVID-19    12/28/19  . Insomnia   . Obesity (BMI 30-39.9)   . Prediabetes     Past Surgical History:  Procedure Laterality Date  . NO PAST SURGERIES      Family Psychiatric History: Reviewed.  Family History:  Family History  Problem Relation Age of Onset  . Hypertension Mother   . Hyperlipidemia Mother   . Diabetes Mellitus II Mother   . Anxiety disorder Mother   . Depression Mother   . Anxiety disorder Brother   . Depression Brother   . Hypertension Brother   . Hyperlipidemia Brother   . Diabetes type II Brother   . ADD / ADHD Brother   . Dementia Maternal Grandfather     Social History:  Social History   Socioeconomic History  . Marital status: Married    Spouse name: Not on file  . Number of children: Not on file  . Years of education: Not on file  . Highest education level: Not on file  Occupational History  . Not on file  Tobacco Use  . Smoking status: Never Smoker  . Smokeless tobacco: Never Used  Substance and Sexual Activity  . Alcohol use: Not  Currently  . Drug use: Not Currently  . Sexual activity: Yes    Partners: Female  Other Topics Concern  . Not on file  Social History Narrative   Married no kids wife is Hiroki Wint    -wife and mother [name] on [place]      Has siblings    Works Educational psychologist IT    1 dog    Social Determinants of Health   Financial Resource Strain: Not on [employer] Insecurity: Not on file  Transportation Needs: Not on file  Physical Activity: Not on file  Stress: Not  on file  Social Connections: Not on file    Allergies: No Known Allergies  Metabolic Disorder Labs: Lab Results  Component Value Date   HGBA1C 5.8 06/14/2020   No results found for: PROLACTIN Lab Results  Component Value Date   CHOL 185 06/14/2020   TRIG 215.0 (H) 06/14/2020   HDL 29.10 (L) 06/14/2020   CHOLHDL 6 06/14/2020   VLDL 43.0 (H) 06/14/2020   LDLCALC 102 (H) 06/11/2019   Lab Results  Component Value Date   TSH 2.55 06/14/2020   TSH 3.38 06/11/2019    Therapeutic Level Labs: No results found for: LITHIUM No results found for: VALPROATE No components found for:  CBMZ  Current Medications: Current Outpatient Medications  Medication Sig Dispense Refill  . amphetamine-dextroamphetamine (ADDERALL XR) 30 MG 24 hr capsule Take 1 capsule (30 mg total) by mouth every morning. 30 capsule 0  . [START ON 03/14/2021] amphetamine-dextroamphetamine (ADDERALL XR) 30 MG 24 hr capsule Take 1 capsule (30 mg total) by mouth every morning. 30 capsule 0  . [START ON 02/14/2021] amphetamine-dextroamphetamine (ADDERALL XR) 30 MG 24 hr capsule Take 1 capsule (30 mg total) by mouth every morning. 30 capsule 0  . Benzoyl Peroxide 10 % LIQD Apply 1 Bottle topically daily as needed. Acne Be careful may bleach clothing (Patient not taking: Reported on 07/13/2020) 160 g 11  . Cholecalciferol 1.25 MG (50000 UT) capsule Take 1 capsule (50,000 Units total) by mouth once a week. 13 capsule 1  . DULoxetine (CYMBALTA) 60 MG capsule Take 1 capsule (60 mg total) by mouth daily. 90 capsule 1  . zolpidem (AMBIEN CR) 12.5 MG CR tablet Take 1 tablet (12.5 mg total) by mouth at bedtime as needed for sleep. 30 tablet 2   No current facility-administered medications for this visit.     Psychiatric Specialty Exam: Review of Systems  Psychiatric/Behavioral: Positive for sleep disturbance. The patient is nervous/anxious.   All other systems reviewed and are negative.   There were no vitals taken for this  visit.There is no height or weight on file to calculate BMI.  General Appearance: NA  Eye Contact:  NA  Speech:  Clear and Coherent and Normal Rate  Volume:  Normal  Mood:  Less anxious and depressed.  Affect:  NA  Thought Process:  Goal Directed and Linear  Orientation:  Full (Time, Place, and Person)  Thought Content: Logical   Suicidal Thoughts:  No  Homicidal Thoughts:  No  Memory:  Immediate;   Good Recent;   Good Remote;   Good  Judgement:  Good  Insight:  Good  Psychomotor Activity:  NA  Concentration:  Concentration: Good  Recall:  Good  Fund of Knowledge: Good  Language: Good  Akathisia:  Negative  Handed:  Right  AIMS (if indicated): not done  Assets:  Communication Skills Desire for Improvement Financial Resources/Insurance Housing Talents/Skills  ADL's:  Intact  Cognition: WNL  Sleep:  Fair   Screenings: GAD-7   Flowsheet Row Office Visit from 09/08/2019 in [facility]  Total GAD-7 Score 20    PHQ2-9   Flowsheet Row Office Visit from 01/13/2020 in [facility] Office Visit from 09/08/2019 in [facility] Office Visit from 06/02/2019 in [facility]  PHQ-2 Total Score 0 6 6  PHQ-9 Total Score -- 26 19       Assessment and Plan: 40year old married male who presents with chronic depressionand anxiety.In addition to depression/anxiety he also admits to binge eating - feels that when stressed out he overeats. He reports marital conflict and difficulty with communication being likely a trigger for current depression. 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 He was previously married for 2.5 years but after he found his ex wife cheating on him he got divorced. Heremarried within 7 months. Sleep initially improved but marital stress increased recently and he again needs to take zolpidem nightly. He also started to binge eat again. While in school he was  diagnosed with having ADHD inattentive type and was on Ritalin.We have started Adderall XR 25 mg (Vyvanse is prohibitively priced for him) and he noticed some improvement.The dose has since been increased to 30 mg.Zoloft was added for depression/anxiety - he tolerated it well- we increased dosegradually to 200 mg. After initial improvement in moodhe noticed that mood started to decline. We have cross taperedsertraline toduloxetine. He has now been on duloxetine 60 mg for about 6 months (tolerates it well) and finds it helpful.He takes zolpidem as needed for insomnia. We also tried topiramate for binge eating but he did not tolerate it well and stopped after just one week. Binge eating subsided spontaneously.  Dx: Major depressive disorder recurrentmild; GAD; ADD; Binge eating disorder  Plan: Continueduloxetine 60 mg,zolpidem CRas needed for insomniaand Adderall XR 30 mgdaily.Next appointment with me in2 months.The plan was discussed with patient who had an opportunity to ask questions and these were all answered. I spend51minutes inphone consultationwith the patient.    Magdalene Patricia, MD 01/17/2021, 9:10 AM

## 2021-01-18 ENCOUNTER — Other Ambulatory Visit (HOSPITAL_COMMUNITY): Payer: Self-pay | Admitting: Psychiatry

## 2021-03-16 ENCOUNTER — Other Ambulatory Visit (HOSPITAL_COMMUNITY): Payer: Self-pay | Admitting: Psychiatry

## 2021-03-16 ENCOUNTER — Other Ambulatory Visit: Payer: Self-pay

## 2021-03-16 ENCOUNTER — Telehealth (INDEPENDENT_AMBULATORY_CARE_PROVIDER_SITE_OTHER): Payer: BC Managed Care – PPO | Admitting: Psychiatry

## 2021-03-16 DIAGNOSIS — F900 Attention-deficit hyperactivity disorder, predominantly inattentive type: Secondary | ICD-10-CM

## 2021-03-16 DIAGNOSIS — F411 Generalized anxiety disorder: Secondary | ICD-10-CM

## 2021-03-16 DIAGNOSIS — F3341 Major depressive disorder, recurrent, in partial remission: Secondary | ICD-10-CM

## 2021-03-16 MED ORDER — AMPHETAMINE-DEXTROAMPHET ER 30 MG PO CP24: 30.0000 mg | ORAL_CAPSULE | ORAL | 0 refills | Status: DC

## 2021-03-16 MED ORDER — DULOXETINE HCL 60 MG PO CPEP: 60.0000 mg | ORAL_CAPSULE | Freq: Every day | ORAL | 1 refills | Status: DC

## 2021-03-16 MED ORDER — ZOLPIDEM TARTRATE ER 12.5 MG PO TBCR: 12.5000 mg | EXTENDED_RELEASE_TABLET | Freq: Every evening | ORAL | 1 refills | Status: DC | PRN

## 2021-03-16 NOTE — Progress Notes (Signed)
BH MD/PA/NP OP Progress Note  03/16/2021 10:41 AM Cory Gomez  MRN:  [id number] Interview was conducted by phone and I verified that I was speaking with the correct person using two identifiers. I discussed the limitations of evaluation and management by telemedicine and  the availability of in person appointments. Patient expressed understanding and agreed to proceed. Participants in the visit: patient (location - home); physician (location - home office).  Chief Complaint: "I have been having problems filling Rx for Adderal".  HPI: 40year old married male who presents with chronic depressionand anxiety.In addition to depression/anxiety he also admits to binge eating - feels that when stressed out he overeats. He reports marital conflict and difficulty with communication being likely a trigger for current depression. 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 He was previously married for 2.5 years but after he found his ex wife cheating on him he got divorced. Heremarried within 7 months. Sleep initially improved but marital stress increased recently and he again needs to take zolpidem nightly. He also started to binge eat again.While in school he was diagnosed with having ADHD inattentive type and was on Ritalin.We have started Adderall XR 25 mg (Vyvanse is prohibitively priced for him) and he noticed some improvement.The dose has since been increased to 30 mg.Zoloft was added for depression/anxiety - he tolerated it well- we increased dosegradually to 200 mg.After initialimprovement in moodhe noticed that mood started todecline.We have cross taperedsertraline toduloxetine. He has now been on duloxetine 60 mg for about 43months(tolerates it well) and finds it helpful.He takes zolpidem as needed for insomnia. We also tried topiramate for binge eating but he did not tolerate it well and stopped after just one week. Binge eating subsided  spontaneously. He has been having problems with filling Rx for Adderall at [facility] and would like to switch pharmacy to [facility] as he started working for [employer]..   Visit Diagnosis:    ICD-10-CM   1. Attention deficit hyperactivity disorder (ADHD), predominantly inattentive type  F90.0   2. GAD (generalized anxiety disorder)  F41.1   3. Major depressive disorder, recurrent episode, in partial remission (HCC)  F33.41     Past Psychiatric History: Please see intake H&P.  Past Medical History:  Past Medical History:  Diagnosis Date  . ADHD (attention deficit hyperactivity disorder)   . Anxiety and depression   . COVID-19    12/28/19  . Insomnia   . Obesity (BMI 30-39.9)   . Prediabetes     Past Surgical History:  Procedure Laterality Date  . NO PAST SURGERIES      Family Psychiatric History: Reviewed.  Family History:  Family History  Problem Relation Age of Onset  . Hypertension Mother   . Hyperlipidemia Mother   . Diabetes Mellitus II Mother   . Anxiety disorder Mother   . Depression Mother   . Anxiety disorder Brother   . Depression Brother   . Hypertension Brother   . Hyperlipidemia Brother   . Diabetes type II Brother   . ADD / ADHD Brother   . Dementia Maternal Grandfather     Social History:  Social History   Socioeconomic History  . Marital status: Married    Spouse name: Not on file  . Number of children: Not on file  . Years of education: Not on file  . Highest education level: Not on file  Occupational History  . Not on file  Tobacco Use  .  Smoking status: Never Smoker  . Smokeless tobacco: Never Used  Substance and Sexual Activity  . Alcohol use: Not Currently  . Drug use: Not Currently  . Sexual activity: Yes    Partners: Female  Other Topics Concern  . Not on file  Social History Narrative   Married no kids wife is Cory Gomez    -wife and [name] on [place]      Has siblings    Works Educational psychologist IT    1 dog     Social Determinants of Health   Financial Resource Strain: Not on [employer] Insecurity: Not on file  Transportation Needs: Not on file  Physical Activity: Not on file  Stress: Not on file  Social Connections: Not on file    Allergies: No Known Allergies  Metabolic Disorder Labs: Lab Results  Component Value Date   HGBA1C 5.8 06/14/2020   No results found for: PROLACTIN Lab Results  Component Value Date   CHOL 185 06/14/2020   TRIG 215.0 (H) 06/14/2020   HDL 29.10 (L) 06/14/2020   CHOLHDL 6 06/14/2020   VLDL 43.0 (H) 06/14/2020   LDLCALC 102 (H) 06/11/2019   Lab Results  Component Value Date   TSH 2.55 06/14/2020   TSH 3.38 06/11/2019    Therapeutic Level Labs: No results found for: LITHIUM No results found for: VALPROATE No components found for:  CBMZ  Current Medications: Current Outpatient Medications  Medication Sig Dispense Refill  . amphetamine-dextroamphetamine (ADDERALL XR) 30 MG 24 hr capsule Take 1 capsule (30 mg total) by mouth every morning. 30 capsule 0  . [START ON 04/15/2021] amphetamine-dextroamphetamine (ADDERALL XR) 30 MG 24 hr capsule Take 1 capsule (30 mg total) by mouth every morning. 30 capsule 0  . [START ON 05/15/2021] amphetamine-dextroamphetamine (ADDERALL XR) 30 MG 24 hr capsule Take 1 capsule (30 mg total) by mouth every morning. 30 capsule 0  . Benzoyl Peroxide 10 % LIQD Apply 1 Bottle topically daily as needed. Acne Be careful may bleach clothing (Patient not taking: Reported on 07/13/2020) 160 g 11  . Cholecalciferol 1.25 MG (50000 UT) capsule Take 1 capsule (50,000 Units total) by mouth once a week. 13 capsule 1  . DULoxetine (CYMBALTA) 60 MG capsule Take 1 capsule (60 mg total) by mouth daily. 90 capsule 1  . zolpidem (AMBIEN CR) 12.5 MG CR tablet Take 1 tablet (12.5 mg total) by mouth at bedtime as needed for sleep. 30 tablet 1   No current facility-administered medications for this visit.      Psychiatric Specialty  Exam: Review of Systems  Psychiatric/Behavioral: Positive for decreased concentration and sleep disturbance.  All other systems reviewed and are negative.   There were no vitals taken for this visit.There is no height or weight on file to calculate BMI.  General Appearance: NA  Eye Contact:  NA  Speech:  Clear and Coherent and Normal Rate  Volume:  Normal  Mood:  Mildly anxious  Affect:  NA  Thought Process:  Goal Directed  Orientation:  Full (Time, Place, and Person)  Thought Content: Logical   Suicidal Thoughts:  No  Homicidal Thoughts:  No  Memory:  Immediate;   Good Recent;   Good Remote;   Good  Judgement:  Good  Insight:  Good  Psychomotor Activity:  NA  Concentration:  Concentration: Fair  Recall:  Good  Fund of Knowledge: Good  Language: Good  Akathisia:  Negative  Handed:  Right  AIMS (if indicated): not  done  Assets:  Communication Skills Desire for Improvement Financial Resources/Insurance Housing Vocational/Educational  ADL's:  Intact  Cognition: WNL  Sleep:  Good   Screenings: GAD-7   Flowsheet Row Office Visit from 09/08/2019 in [facility] [place]  Total GAD-7 Score 20    PHQ2-9   Flowsheet Row Office Visit from 01/13/2020 in [facility] [place] Office Visit from 09/08/2019 in [facility] [place] Office Visit from 06/02/2019 in [facility] [place]  PHQ-2 Total Score 0 6 6  PHQ-9 Total Score - 26 19       Assessment and Plan: 40year old married male who presents with chronic depressionand anxiety.In addition to depression/anxiety he also admits to binge eating - feels that when stressed out he overeats. He reports marital conflict and difficulty with communication being likely a trigger for current depression. 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 He was previously married for 2.5 years but after he found his ex wife cheating on him he got divorced. Heremarried  within 7 months. Sleep initially improved but marital stress increased recently and he again needs to take zolpidem nightly. He also started to binge eat again.While in school he was diagnosed with having ADHD inattentive type and was on Ritalin.We have started Adderall XR 25 mg (Vyvanse is prohibitively priced for him) and he noticed some improvement.The dose has since been increased to 30 mg.Zoloft was added for depression/anxiety - he tolerated it well- we increased dosegradually to 200 mg.After initialimprovement in moodhe noticed that mood started todecline.We have cross taperedsertraline toduloxetine. He has now been on duloxetine 60 mg for about 68months(tolerates it well) and finds it helpful.He takes zolpidem as needed for insomnia. We also tried topiramate for binge eating but he did not tolerate it well and stopped after just one week. Binge eating subsided spontaneously. He has been having problems with filling Rx for [facility] at [facility] and would like to switch pharmacy to [facility] as he started working for [employer]..  Dx: Major depressive disorder recurrentin partial remission; GAD; ADD  Plan: Continueduloxetine 60 mg,zolpidem CRas needed for insomnia(does not use it often) and Adderall XR 30 mgdaily.Next appointment with in3 months.The plan was discussed with patient who had an opportunity to ask questions and these were all answered. I spend30minutes inphone consultationwith the patient.    Magdalene Patricia, MD 03/16/2021, 10:41 AM

## 2021-04-07 ENCOUNTER — Ambulatory Visit: Payer: BC Managed Care – PPO | Attending: Internal Medicine

## 2021-04-07 ENCOUNTER — Other Ambulatory Visit: Payer: Self-pay

## 2021-04-07 DIAGNOSIS — Z23 Encounter for immunization: Secondary | ICD-10-CM

## 2021-04-07 MED ORDER — PFIZER-BIONT COVID-19 VAC-TRIS 30 MCG/0.3ML IM SUSP
INTRAMUSCULAR | 0 refills | Status: DC
  Filled 2021-04-07: qty 0.3, 1d supply, fill #0

## 2021-04-07 NOTE — Progress Notes (Signed)
Covid-19 Vaccination Clinic  Name:  Cory Gomez    MRN: [id number] DOB: 01/16/1981  04/07/2021  [name] was observed post Covid-19 immunization for 15 minutes without incident. He was provided with Vaccine Information Sheet and instruction to access the V-Safe system.   [name] was instructed to call 911 with any severe reactions post vaccine: Marland Kitchen Difficulty breathing  . Swelling of face and throat  . A fast heartbeat  . A bad rash all over body  . Dizziness and weakness   Immunizations Administered    Name Date Dose VIS Date Route   PFIZER Comrnaty(Gray TOP) Covid-19 Vaccine 04/07/2021  3:11 PM 0.3 mL 12/01/2020 Intramuscular   Manufacturer: [employer], [employer]   Lot: [id number]   NDC: [phone]

## 2021-05-23 ENCOUNTER — Telehealth (HOSPITAL_COMMUNITY): Payer: Self-pay | Admitting: *Deleted

## 2021-05-23 NOTE — Telephone Encounter (Signed)
Telephone call to patient to advise that this office will no longer be able to provide services.  Patient has enough medication.  No refill needed.. Made  patient aware we will no longer provide medication or a psychiatrist appointment. Advised of letter sent with resources.   

## 2021-05-30 ENCOUNTER — Telehealth (HOSPITAL_COMMUNITY): Payer: BC Managed Care – PPO | Admitting: Psychiatry

## 2021-06-14 ENCOUNTER — Other Ambulatory Visit (INDEPENDENT_AMBULATORY_CARE_PROVIDER_SITE_OTHER): Payer: 59

## 2021-06-14 ENCOUNTER — Other Ambulatory Visit: Payer: Self-pay

## 2021-06-14 DIAGNOSIS — R7303 Prediabetes: Secondary | ICD-10-CM

## 2021-06-14 DIAGNOSIS — Z0000 Encounter for general adult medical examination without abnormal findings: Secondary | ICD-10-CM | POA: Diagnosis not present

## 2021-06-14 DIAGNOSIS — Z1329 Encounter for screening for other suspected endocrine disorder: Secondary | ICD-10-CM | POA: Diagnosis not present

## 2021-06-14 DIAGNOSIS — E559 Vitamin D deficiency, unspecified: Secondary | ICD-10-CM

## 2021-06-14 DIAGNOSIS — E785 Hyperlipidemia, unspecified: Secondary | ICD-10-CM | POA: Diagnosis not present

## 2021-06-14 LAB — CBC WITH DIFFERENTIAL/PLATELET
Basophils Absolute: 0.1 10*3/uL (ref 0.0–0.1)
Basophils Relative: 0.8 % (ref 0.0–3.0)
Eosinophils Absolute: 0.1 10*3/uL (ref 0.0–0.7)
Eosinophils Relative: 2.1 % (ref 0.0–5.0)
HCT: 42.3 % (ref 39.0–52.0)
Hemoglobin: 14.5 g/dL (ref 13.0–17.0)
Lymphocytes Relative: 22.5 % (ref 12.0–46.0)
Lymphs Abs: 1.4 10*3/uL (ref 0.7–4.0)
MCHC: 34.3 g/dL (ref 30.0–36.0)
MCV: 87.6 fl (ref 78.0–100.0)
Monocytes Absolute: 0.4 10*3/uL (ref 0.1–1.0)
Monocytes Relative: 6.7 % (ref 3.0–12.0)
Neutro Abs: 4.2 10*3/uL (ref 1.4–7.7)
Neutrophils Relative %: 67.9 % (ref 43.0–77.0)
Platelets: 295 10*3/uL (ref 150.0–400.0)
RBC: 4.82 Mil/uL (ref 4.22–5.81)
RDW: 13.2 % (ref 11.5–15.5)
WBC: 6.2 10*3/uL (ref 4.0–10.5)

## 2021-06-14 LAB — LIPID PANEL
Cholesterol: 199 mg/dL (ref 0–200)
HDL: 27.9 mg/dL — ABNORMAL LOW (ref >39.00)
NonHDL: 171.05
Total CHOL/HDL Ratio: 7
Triglycerides: 227 mg/dL — ABNORMAL HIGH (ref 0.0–149.0)
VLDL: 45.4 mg/dL — ABNORMAL HIGH (ref 0.0–40.0)

## 2021-06-14 LAB — TSH: TSH: 2.91 u[IU]/mL (ref 0.35–4.50)

## 2021-06-14 LAB — COMPREHENSIVE METABOLIC PANEL
ALT: 34 U/L (ref 0–53)
AST: 27 U/L (ref 0–37)
Albumin: 4.5 g/dL (ref 3.5–5.2)
Alkaline Phosphatase: 65 U/L (ref 39–117)
BUN: 13 mg/dL (ref 6–23)
CO2: 24 mEq/L (ref 19–32)
Calcium: 9.3 mg/dL (ref 8.4–10.5)
Chloride: 103 mEq/L (ref 96–112)
Creatinine, Ser: 0.87 mg/dL (ref 0.40–1.50)
GFR: 110.12 mL/min (ref >60.00)
Glucose, Bld: 106 mg/dL — ABNORMAL HIGH (ref 70–99)
Potassium: 3.9 mEq/L (ref 3.5–5.1)
Sodium: 138 mEq/L (ref 135–145)
Total Bilirubin: 0.8 mg/dL (ref 0.2–1.2)
Total Protein: 7.2 g/dL (ref 6.0–8.3)

## 2021-06-14 LAB — LDL CHOLESTEROL, DIRECT: Direct LDL: 144 mg/dL

## 2021-06-14 LAB — VITAMIN D 25 HYDROXY (VIT D DEFICIENCY, FRACTURES): VITD: 28.76 ng/mL — ABNORMAL LOW (ref 30.00–100.00)

## 2021-06-14 LAB — HEMOGLOBIN A1C: Hgb A1c MFr Bld: 6 % (ref 4.6–6.5)

## 2021-06-15 LAB — URINALYSIS, ROUTINE W REFLEX MICROSCOPIC
Bilirubin Urine: NEGATIVE
Glucose, UA: NEGATIVE
Hgb urine dipstick: NEGATIVE
Ketones, ur: NEGATIVE
Leukocytes,Ua: NEGATIVE
Nitrite: NEGATIVE
Protein, ur: NEGATIVE
Specific Gravity, Urine: 1.025 (ref 1.001–1.035)
pH: 5.5 (ref 5.0–8.0)

## 2021-06-21 ENCOUNTER — Telehealth: Payer: Self-pay | Admitting: *Deleted

## 2021-06-21 NOTE — Telephone Encounter (Signed)
LMTCB

## 2021-06-21 NOTE — Telephone Encounter (Signed)
-----   Message from Sherlene Shams, MD sent at 06/14/2021  8:03 PM EDT ----- Labs reviewed.  Nothing urgent . Can discuss cholesterol and vit d with Dr [employer] at upcoming visit.   Regards,   Duncan Dull, MD

## 2021-06-23 NOTE — Progress Notes (Signed)
No answer, no voicemail.   Mychart message sent to contact office for results.

## 2021-06-27 ENCOUNTER — Telehealth: Payer: Self-pay | Admitting: Internal Medicine

## 2021-06-27 ENCOUNTER — Ambulatory Visit: Payer: BC Managed Care – PPO | Admitting: Internal Medicine

## 2021-06-27 NOTE — Telephone Encounter (Signed)
Patient no-showed today's appointment; appointment was for 06/27/21, provider notified for review of record. Letter sent for patient to call in and re-schedule.

## 2021-07-14 ENCOUNTER — Other Ambulatory Visit: Payer: Self-pay

## 2021-07-14 ENCOUNTER — Ambulatory Visit: Payer: 59 | Admitting: Internal Medicine

## 2021-07-14 ENCOUNTER — Encounter: Payer: Self-pay | Admitting: Internal Medicine

## 2021-07-14 VITALS — BP 126/84 | HR 89 | Temp 97.9°F | Ht 74.0 in | Wt 318.6 lb

## 2021-07-14 DIAGNOSIS — F339 Major depressive disorder, recurrent, unspecified: Secondary | ICD-10-CM

## 2021-07-14 DIAGNOSIS — E781 Pure hyperglyceridemia: Secondary | ICD-10-CM

## 2021-07-14 DIAGNOSIS — F5081 Binge eating disorder: Secondary | ICD-10-CM | POA: Diagnosis not present

## 2021-07-14 DIAGNOSIS — G4700 Insomnia, unspecified: Secondary | ICD-10-CM | POA: Diagnosis not present

## 2021-07-14 DIAGNOSIS — F900 Attention-deficit hyperactivity disorder, predominantly inattentive type: Secondary | ICD-10-CM | POA: Diagnosis not present

## 2021-07-14 DIAGNOSIS — Z0000 Encounter for general adult medical examination without abnormal findings: Secondary | ICD-10-CM

## 2021-07-14 DIAGNOSIS — Z6841 Body Mass Index (BMI) 40.0 and over, adult: Secondary | ICD-10-CM

## 2021-07-14 DIAGNOSIS — F411 Generalized anxiety disorder: Secondary | ICD-10-CM

## 2021-07-14 DIAGNOSIS — E559 Vitamin D deficiency, unspecified: Secondary | ICD-10-CM

## 2021-07-14 DIAGNOSIS — R45851 Suicidal ideations: Secondary | ICD-10-CM

## 2021-07-14 DIAGNOSIS — L700 Acne vulgaris: Secondary | ICD-10-CM

## 2021-07-14 DIAGNOSIS — F3341 Major depressive disorder, recurrent, in partial remission: Secondary | ICD-10-CM

## 2021-07-14 DIAGNOSIS — R7303 Prediabetes: Secondary | ICD-10-CM

## 2021-07-14 HISTORY — DX: Suicidal ideations: R45.851

## 2021-07-14 MED ORDER — ZOLPIDEM TARTRATE ER 12.5 MG PO TBCR
12.5000 mg | EXTENDED_RELEASE_TABLET | Freq: Every evening | ORAL | 5 refills | Status: DC | PRN
  Filled 2021-07-14: qty 30, 30d supply, fill #0
  Filled 2021-12-07 – 2021-12-28 (×2): qty 30, 30d supply, fill #1

## 2021-07-14 MED ORDER — CLINDAMYCIN PHOSPHATE 1 % EX LOTN
TOPICAL_LOTION | Freq: Two times a day (BID) | CUTANEOUS | 11 refills | Status: DC
  Filled 2021-07-14: qty 60, 30d supply, fill #0

## 2021-07-14 MED ORDER — AMPHETAMINE-DEXTROAMPHET ER 30 MG PO CP24
30.0000 mg | ORAL_CAPSULE | ORAL | 0 refills | Status: DC
Start: 2021-07-14 — End: 2022-01-01
  Filled 2021-07-14: qty 30, 30d supply, fill #0

## 2021-07-14 MED ORDER — AMPHETAMINE-DEXTROAMPHET ER 30 MG PO CP24
30.0000 mg | ORAL_CAPSULE | ORAL | 0 refills | Status: DC
  Filled 2021-07-14 – 2021-09-29 (×2): qty 30, 30d supply, fill #0

## 2021-07-14 MED ORDER — AMPHETAMINE-DEXTROAMPHET ER 30 MG PO CP24
30.0000 mg | ORAL_CAPSULE | ORAL | 0 refills | Status: DC
Start: 2021-07-14 — End: 2021-08-18
  Filled 2021-07-14: qty 30, 30d supply, fill #0

## 2021-07-14 NOTE — Progress Notes (Signed)
Chief Complaint  Patient presents with   Follow-up   Annual  1. H/o adhd, anxiety/depression/insomnia/stress Ie sole provider for his family he and wife and he does have SI w/o a plan and does not feel like he needs acute hospital at this time this is chronic.  Pt was f/u with psychiatry Dr. Delman Cheadle which he liked but he retired and he needs new referral for psychiatry and wants to see Dr. Maryruth Bun if she takes his insurance  He is out of adderral 30 xr due to being w/o psychiatrist they set him up with a new one at prior clinic but he received letter she is no longer there as well  Needs refill of ambien CR 12.5 mg qhs  Currently does not have a therapist either   2. Labs reviewed 06/14/21 high trigs 227 and prediabetes A1c 6.0 disc healthy diet and exercise    Review of Systems  Constitutional:  Negative for weight loss.  HENT:  Negative for hearing loss.   Eyes:  Negative for blurred vision.  Respiratory:  Negative for shortness of breath.   Cardiovascular:  Negative for chest pain.  Gastrointestinal:  Negative for abdominal pain.  Musculoskeletal:  Negative for falls and joint pain.  Skin:  Negative for rash.  Neurological:  Negative for headaches.  Psychiatric/Behavioral:  Positive for depression and suicidal ideas. The patient is nervous/anxious and has insomnia.        +stress   Past Medical History:  Diagnosis Date   ADHD (attention deficit hyperactivity disorder)    Anxiety and depression    COVID-19    12/28/19   Insomnia    Obesity (BMI 30-39.9)    Prediabetes    Past Surgical History:  Procedure Laterality Date   NO PAST SURGERIES     Family History  Problem Relation Age of Onset   Diabetes Mother    Hypertension Mother    Hyperlipidemia Mother    Diabetes Mellitus II Mother    Anxiety disorder Mother    Depression Mother    Stroke Father    Alcoholism Father    Anxiety disorder Brother    Depression Brother    Hypertension Brother    Hyperlipidemia  Brother    Diabetes type II Brother    ADD / ADHD Brother    Dementia Maternal Grandfather    Social History   Socioeconomic History   Marital status: Married    Spouse name: Not on file   Number of children: Not on file   Years of education: Not on file   Highest education level: Not on file  Occupational History   Not on file  Tobacco Use   Smoking status: Never   Smokeless tobacco: Never  Substance and Sexual Activity   Alcohol use: Not Currently   Drug use: Not Currently   Sexual activity: Yes    Partners: Female  Other Topics Concern   Not on file  Social History Narrative   Married no kids wife is Rosemary Pentecost    -wife and mother [name] on [place]      Has siblings    Works Educational psychologist IT    1 dog    Social Determinants of Corporate investment banker Strain: Not on [employer] Insecurity: Not on file  Transportation Needs: Not on file  Physical Activity: Not on file  Stress: Not on file  Social Connections: Not on file  Intimate Partner Violence: Not on file   Current Meds  Medication Sig   amphetamine-dextroamphetamine (ADDERALL XR) 30 MG 24 hr capsule TAKE 1 CAPSULE BY MOUTH EVERY MORNING.   Benzoyl Peroxide 10 % LIQD Apply 1 Bottle topically daily as needed. Acne Be careful may bleach clothing   Cholecalciferol 1.25 MG (50000 UT) capsule Take 1 capsule (50,000 Units total) by mouth once a week.   clindamycin (CLEOCIN T) 1 % lotion Apply topically 2 (two) times daily as needed on face and body   DULoxetine (CYMBALTA) 60 MG capsule Take 1 capsule (60 mg total) by mouth daily.   [DISCONTINUED] zolpidem (AMBIEN CR) 12.5 MG CR tablet TAKE 1 TABLET (12.5 MG TOTAL) BY MOUTH AT BEDTIME AS NEEDED FOR SLEEP.   No Known Allergies Recent Results (from the past 2160 hour(s))  Vitamin D (25 hydroxy)     Status: Abnormal   Collection Time: 06/14/21  8:00 AM  Result Value Ref Range   VITD 28.76 (L) 30.00 - 100.00 ng/mL  Hemoglobin A1c     Status: None   Collection  Time: 06/14/21  8:00 AM  Result Value Ref Range   Hgb A1c MFr Bld 6.0 4.6 - 6.5 %    Comment: Glycemic Control Guidelines for People with Diabetes:Non Diabetic:  <6%Goal of Therapy: <7%Additional Action Suggested:  >8%   Urinalysis, Routine w reflex microscopic     Status: Abnormal   Collection Time: 06/14/21  8:00 AM  Result Value Ref Range   Color, Urine YELLOW YELLOW   APPearance CLOUDY (A) CLEAR   Specific Gravity, Urine 1.025 1.001 - 1.035   pH 5.5 5.0 - 8.0   Glucose, UA NEGATIVE NEGATIVE   Bilirubin Urine NEGATIVE NEGATIVE   Ketones, ur NEGATIVE NEGATIVE   Hgb urine dipstick NEGATIVE NEGATIVE   Protein, ur NEGATIVE NEGATIVE   Nitrite NEGATIVE NEGATIVE   Leukocytes,Ua NEGATIVE NEGATIVE  TSH     Status: None   Collection Time: 06/14/21  8:00 AM  Result Value Ref Range   TSH 2.91 0.35 - 4.50 uIU/mL  CBC with Differential/Platelet     Status: None   Collection Time: 06/14/21  8:00 AM  Result Value Ref Range   WBC 6.2 4.0 - 10.5 K/uL   RBC 4.82 4.22 - 5.81 Mil/uL   Hemoglobin 14.5 13.0 - 17.0 g/dL   HCT 67.5 91.6 - 38.4 %   MCV 87.6 78.0 - 100.0 fl   MCHC 34.3 30.0 - 36.0 g/dL   RDW 66.5 99.3 - 57.0 %   Platelets 295.0 150.0 - 400.0 K/uL   Neutrophils Relative % 67.9 43.0 - 77.0 %   Lymphocytes Relative 22.5 12.0 - 46.0 %   Monocytes Relative 6.7 3.0 - 12.0 %   Eosinophils Relative 2.1 0.0 - 5.0 %   Basophils Relative 0.8 0.0 - 3.0 %   Neutro Abs 4.2 1.4 - 7.7 K/uL   Lymphs Abs 1.4 0.7 - 4.0 K/uL   Monocytes Absolute 0.4 0.1 - 1.0 K/uL   Eosinophils Absolute 0.1 0.0 - 0.7 K/uL   Basophils Absolute 0.1 0.0 - 0.1 K/uL  Lipid panel     Status: Abnormal   Collection Time: 06/14/21  8:00 AM  Result Value Ref Range   Cholesterol 199 0 - 200 mg/dL    Comment: ATP III Classification       Desirable:  < 200 mg/dL               Borderline High:  200 - 239 mg/dL          High:  > =  240 mg/dL   Triglycerides 324.4227.0 (H) 0.0 - 149.0 mg/dL    Comment: Normal:  <010<150 mg/dLBorderline  High:  150 - 199 mg/dL   HDL 27.2527.90 (L) >36.64>39.00 mg/dL   VLDL 40.345.4 (H) 0.0 - 47.440.0 mg/dL   Total CHOL/HDL Ratio 7     Comment:                Men          Women1/2 Average Risk     3.4          3.3Average Risk          5.0          4.42X Average Risk          9.6          7.13X Average Risk          15.0          11.0                       NonHDL 171.05     Comment: NOTE:  Non-HDL goal should be 30 mg/dL higher than patient's LDL goal (i.e. LDL goal of < 70 mg/dL, would have non-HDL goal of < 100 mg/dL)  Comprehensive metabolic panel     Status: Abnormal   Collection Time: 06/14/21  8:00 AM  Result Value Ref Range   Sodium 138 135 - 145 mEq/L   Potassium 3.9 3.5 - 5.1 mEq/L   Chloride 103 96 - 112 mEq/L   CO2 24 19 - 32 mEq/L   Glucose, Bld 106 (H) 70 - 99 mg/dL   BUN 13 6 - 23 mg/dL   Creatinine, Ser 2.590.87 0.40 - 1.50 mg/dL   Total Bilirubin 0.8 0.2 - 1.2 mg/dL   Alkaline Phosphatase 65 39 - 117 U/L   AST 27 0 - 37 U/L   ALT 34 0 - 53 U/L   Total Protein 7.2 6.0 - 8.3 g/dL   Albumin 4.5 3.5 - 5.2 g/dL   GFR 563.87110.12 >56.43>60.00 mL/min    Comment: Calculated using the CKD-EPI Creatinine Equation (2021)   Calcium 9.3 8.4 - 10.5 mg/dL  LDL cholesterol, direct     Status: None   Collection Time: 06/14/21  8:00 AM  Result Value Ref Range   Direct LDL 144.0 mg/dL    Comment: Optimal:  <329<100 mg/dLNear or Above Optimal:  100-129 mg/dLBorderline High:  130-159 mg/dLHigh:  160-189 mg/dLVery High:  >190 mg/dL   Objective  Body mass index is 40.91 kg/m. Wt Readings from Last 3 Encounters:  07/14/21 (!) 318 lb 9.6 oz (144.5 kg)  07/13/20 (!) 310 lb 6.4 oz (140.8 kg)  01/13/20 300 lb (136.1 kg)   Temp Readings from Last 3 Encounters:  07/14/21 97.9 F (36.6 C) (Oral)  07/13/20 (!) 97.5 F (36.4 C) (Oral)  09/08/19 98.3 F (36.8 C) (Temporal)   BP Readings from Last 3 Encounters:  07/14/21 126/84  07/13/20 120/82  09/08/19 132/78   Pulse Readings from Last 3 Encounters:  07/14/21 89   07/13/20 88  09/08/19 86    Physical Exam Vitals and nursing note reviewed.  Constitutional:      Appearance: Normal appearance. He is well-developed and well-groomed. He is morbidly obese.  HENT:     Head: Normocephalic and atraumatic.     Mouth/Throat:     Mouth: Mucous membranes are moist.     Pharynx: Oropharynx is clear.  Eyes:  Conjunctiva/sclera: Conjunctivae normal.     Pupils: Pupils are equal, round, and reactive to light.  Cardiovascular:     Rate and Rhythm: Normal rate and regular rhythm.     Heart sounds: Normal heart sounds.  Pulmonary:     Effort: Pulmonary effort is normal.     Breath sounds: Normal breath sounds.  Abdominal:     General: Abdomen is flat. Bowel sounds are normal.  Skin:    General: Skin is warm and dry.  Neurological:     General: No focal deficit present.     Mental Status: He is alert and oriented to person, place, and time. Mental status is at baseline.     Gait: Gait normal.  Psychiatric:        Attention and Perception: Attention and perception normal.        Mood and Affect: Mood and affect normal.        Speech: Speech normal.        Behavior: Behavior normal. Behavior is cooperative.        Thought Content: Thought content normal.        Cognition and Memory: Cognition and memory normal.        Judgment: Judgment normal.    Assessment  Plan  Annual physical exam Flu shot rec  Tdap utd 06/12/20 Covid 19 vx 2/2 consider booster in future +covid 12/2019    Will need to exercise to lose wt  rec healthy diet and exercise Acne to back some better with benzyol peroxide wash  Colonoscopy age 62  Former PCP Dr. Dewaine Oats Rec vitamin D3 4000 Iu daily otc   Suicidal ideation - Plan: Ambulatory referral to Psychiatry call Dr. Maryruth Bun today she is willing to see pt as new patient referral placed  Given info [facility] [place] and [employer] [facility] offices   Major depressive disorder, recurrent episode, in partial remission (HCC) -  Plan: Ambulatory referral to Psychiatry Attention deficit hyperactivity disorder (ADHD), predominantly inattentive type - Plan: Ambulatory referral to Psychiatry, amphetamine-dextroamphetamine (ADDERALL XR) 30 MG 24 hr capsule, amphetamine-dextroamphetamine (ADDERALL XR) 30 MG 24 hr capsule, amphetamine-dextroamphetamine (ADDERALL XR) 30 MG 24 hr capsule GAD (generalized anxiety disorder) - Plan: Ambulatory referral to Psychiatry Depression, recurrent (HCC) - Plan: Ambulatory referral to Psychiatry Binge eating disorder Insomnia, unspecified type - Plan: zolpidem (AMBIEN CR) 12.5 MG CR tablet  Acne vulgaris - Plan: clindamycin (CLEOCIN T) 1 % lotion with otc panoxyal to face/body   Prediabetes Hypertriglyceridemia  Morbid obesity  -rec healthy diet and exercise   Provider: Dr. French Ana McLean-Scocuzza-Internal Medicine

## 2021-07-14 NOTE — Patient Instructions (Addendum)
Call and see if insurance is taken [place]   [facility] [address]  [place] [address] [phone]   [facility] [address] [place] [address] [phone] --Charlcie Cradle   Vitamin D3 4000 IU daily (nature made)   Prediabetes Eating Plan Prediabetes is a condition that causes blood sugar (glucose) levels to be higher than normal. This increases the risk for developing type 2 diabetes (type 2 diabetes mellitus). Working with a health care provider or nutrition specialist (dietitian) to make diet and lifestyle changes can help prevent the onset of diabetes. These changes may help you: Control your blood glucose levels. Improve your cholesterol levels. Manage your blood pressure. What are tips for following this plan? Reading food labels Read food labels to check the amount of fat, salt (sodium), and sugar in prepackaged foods. Avoid foods that have: Saturated fats. Trans fats. Added sugars. Avoid foods that have more than 300 milligrams (mg) of sodium per serving. Limit your sodium intake to less than 2,300 mg each day. Shopping Avoid buying pre-made and processed foods. Avoid buying drinks with added sugar. Cooking Cook with olive oil. Do not use butter, lard, or ghee. Bake, broil, grill, steam, or boil foods. Avoid frying. Meal planning  Work with your dietitian to create an eating plan that is right for you. This may include tracking how many calories you take in each day. Use a food diary, notebook, or mobile application to track what you eat at each meal. Consider following a Mediterranean diet. This includes: Eating several servings of fresh fruits and vegetables each day. Eating fish at least twice a week. Eating one serving each day of whole grains, beans, nuts, and seeds. Using olive oil instead of other fats. Limiting alcohol. Limiting red meat. Using  nonfat or low-fat dairy products. Consider following a plant-based diet. This includes dietary choices that focus on eating mostly vegetables and fruit, grains, beans, nuts, and seeds. If you have high blood pressure, you may need to limit your sodium intake or follow a diet such as the DASH (Dietary Approaches to Stop Hypertension) eating plan. The DASH diet aims to lower high blood pressure.  Lifestyle Set weight loss goals with help from your health care team. It is recommended that most people with prediabetes lose 7% of their body weight. Exercise for at least 30 minutes 5 or more days a week. Attend a support group or seek support from a mental health counselor. Take over-the-counter and prescription medicines only as told by your health care provider. What foods are recommended? Fruits Berries. Bananas. Apples. Oranges. Grapes. Papaya. Mango. Pomegranate. Kiwi.Grapefruit. Cherries. Vegetables Lettuce. Spinach. Peas. Beets. Cauliflower. Cabbage. Broccoli. Carrots.Tomatoes. Squash. Eggplant. Herbs. Peppers. Onions. Cucumbers. Brussels sprouts. Grains Whole grains, such as whole-wheat or whole-grain breads, crackers, cereals, and pasta. Unsweetened oatmeal. Bulgur. Barley. Quinoa. Brown rice. Corn orwhole-wheat flour tortillas or taco shells. Meats and other proteins Seafood. Poultry without skin. Lean cuts of pork and beef. Tofu. Eggs. Nuts.Beans. Dairy Low-fat or fat-free dairy products, such as yogurt, cottage cheese, and cheese. Beverages Water. Tea. Coffee. Sugar-free or diet soda. Seltzer water. Low-fat or nonfatmilk. Milk alternatives, such as soy or almond milk. Fats and oils Olive oil. Canola oil. Sunflower oil. Grapeseed oil. Avocado. Walnuts. Sweets and desserts Sugar-free or low-fat pudding. Sugar-free or low-fat ice cream and other frozentreats. Seasonings and condiments Herbs. Sodium-free spices. Mustard. Relish. Low-salt, low-sugar ketchup.Low-salt, low-sugar barbecue  sauce. Low-fat or fat-free mayonnaise. The items listed above may not be a complete list of recommended foods and beverages. Contact a dietitian for more information. What foods are not recommended? Fruits Fruits canned with syrup. Vegetables Canned vegetables. Frozen vegetables with butter or cream sauce. Grains Refined white flour and flour products, such as bread, pasta, snack foods, andcereals. Meats and other proteins Fatty cuts of meat. Poultry with skin. Breaded or fried meat. Processed meats. Dairy Full-fat yogurt, cheese, or milk. Beverages Sweetened drinks, such as iced tea and soda. Fats and oils Butter. Lard. Ghee. Sweets and desserts Baked goods, such as cake, cupcakes, pastries, cookies, and cheesecake. Seasonings and condiments Spice mixes with added salt. Ketchup. Barbecue sauce. Mayonnaise. The items listed above may not be a complete list of foods and beverages that are not recommended. Contact a dietitian for more information. Where to find more information American Diabetes Association: www.diabetes.org Summary You may need to make diet and lifestyle changes to help prevent the onset of diabetes. These changes can help you control blood sugar, improve cholesterol levels, and manage blood pressure. Set weight loss goals with help from your health care team. It is recommended that most people with prediabetes lose 7% of their body weight. Consider following a Mediterranean diet. This includes eating plenty of fresh fruits and vegetables, whole grains, beans, nuts, seeds, fish, and low-fat dairy, and using olive oil instead of other fats. This information is not intended to replace advice given to you by your health care provider. Make sure you discuss any questions you have with your healthcare provider. Document Revised: 03/10/2020 Document Reviewed: 03/10/2020 Elsevier Patient Education  2022 Elsevier Inc.   Sunburn, Adult (aveeno products)   Sunburn is damage  to the skin that is caused by too much exposure to ultraviolet (UV) rays. Repeated, prolonged sun exposure causes signs of early skin aging, such as wrinkles and sun spots. It also increases the risk of skincancer. What are the causes? Sunburn is caused by getting too much UV radiation from the sun, sunlamps, ortanning beds. What increases the risk? The following factors may make you more likely to develop this condition: Having light-colored skin (light complexion), skin with many freckles or moles, or skin that tends to burn instead of tan. Having fair or red hair. Having blue or green eyes. Living in an area with strong sun exposure. Having a family history of sensitivity to the sun or a family history of skin cancer. Having a body defense system (immune system) that does not work properly because of certain diseases (such as lupus) or certain drugs. Taking certain medicines that cause you to be sensitive to sunlight (have photosensitivity). Using certain cosmetics. What are the signs or symptoms? Symptoms of this condition include: Red or pink skin. Soreness and swelling of the skin in the affected areas. Pain. Blisters. Peeling skin. If the sunburn is severe, you may also have a headache, fever, nausea,dizziness, or fatigue. How is this diagnosed? This condition is diagnosed with a medical history and physical exam. How is this treated? Mild or moderate sunburns can often be managed with self-care strategies, including: Cool baths or cool compresses. Moisturizer or aloe for pain relief. Over-the-counter pain relievers. Drinking extra water to replace lost fluids and to prevent dehydration. A severe sunburn may require: Antibiotic medicines if there is an associated infection. IV fluids. Follow these instructions at home: Medicines Take or apply over-the-counter and prescription medicines only as told by your health care provider. If you were  prescribed an antibiotic medicine,  use it as told by your health care provider. Do not stop using the antibiotic even if your condition improves. General instructions  Avoid further exposure to the sun. Protect sunburned skin by wearing clothing that covers the injured skin. Do not put ice on your sunburn. This can cause further damage. Try taking a cool bath or applying a cool, wet cloth (cool compress) to your skin. This may help with pain. Drink enough fluid to keep your urine pale yellow. Try applying aloe vera or a moisturizer that has soy in it to your sunburn. This may help. Do not apply aloe vera or moisturizer with soy if your sunburn has blisters. Do not break any blisters that you may have. Keep all follow-up visits as told by your health care provider. This is important.  How is this prevented?  Try to avoid the sun between 10 a.m. and 4 p.m. The sun is strongest during those hours. Apply sunscreen 30 minutes or more before you will be out in the sun. Apply a sunscreen with an SPF of 15 or higher. Consider using an SPF of 30 or higher if you will be exposed to the sun for prolonged periods of time. Use a sunscreen that protects against all of the sun's rays (broad-spectrum) and is water-resistant. Reapply sunscreen: About every 2 hours during sun exposure. More often when sweating a lot while out in the sun. After getting wet from swimming or playing in water. When you are outside, wear long sleeves, a hat, and sunglasses that block UV light. Talk with your health care provider about medicines, herbs, and foods that can make you more sensitive to light. Avoid these, if possible. Do not use tanning beds. Contact a health care provider if: You have a fever or chills. Your symptoms do not improve with treatment. Your pain is not controlled with medicine. Your burn becomes more painful or swollen. Your sunburn results in open blisters. Get help right away if: You vomit or have diarrhea. You are dizzy or you pass  out. You have a severe headache or you feel confused. You develop severe blistering. You have pus or fluid coming from the blisters. Summary Sunburn is caused by getting too much ultraviolet (UV) radiation from the sun, sunlamps, or tanning beds. People with light-colored skin (light complexion) have an increased risk of sunburn. Mild or moderate sunburns can often be managed with self-care strategies, including cool baths or cool cloths (compresses). To help prevent sunburn, apply sunscreen 30 minutes or more before you will be exposed to the sun. This information is not intended to replace advice given to you by your health care provider. Make sure you discuss any questions you have with your healthcare provider. Document Revised: 10/05/2020 Document Reviewed: 10/06/2020 Elsevier Patient Education  2022 [employer].

## 2021-07-17 ENCOUNTER — Encounter: Payer: Self-pay | Admitting: Internal Medicine

## 2021-07-17 DIAGNOSIS — E781 Pure hyperglyceridemia: Secondary | ICD-10-CM

## 2021-07-17 HISTORY — DX: Pure hyperglyceridemia: E78.1

## 2021-07-18 ENCOUNTER — Other Ambulatory Visit: Payer: Self-pay

## 2021-07-23 ENCOUNTER — Other Ambulatory Visit: Payer: Self-pay | Admitting: Internal Medicine

## 2021-07-23 DIAGNOSIS — L700 Acne vulgaris: Secondary | ICD-10-CM

## 2021-07-23 MED ORDER — CLINDAMYCIN PHOSPHATE 1 % EX SOLN
Freq: Two times a day (BID) | CUTANEOUS | 11 refills | Status: DC
  Filled 2021-07-23: qty 60, 30d supply, fill #0

## 2021-07-24 ENCOUNTER — Other Ambulatory Visit: Payer: Self-pay

## 2021-07-26 ENCOUNTER — Other Ambulatory Visit: Payer: Self-pay

## 2021-08-01 ENCOUNTER — Encounter: Payer: Self-pay | Admitting: Internal Medicine

## 2021-08-01 ENCOUNTER — Other Ambulatory Visit: Payer: Self-pay

## 2021-08-01 MED ORDER — DULOXETINE HCL 60 MG PO CPEP
60.0000 mg | ORAL_CAPSULE | Freq: Every day | ORAL | 1 refills | Status: DC
  Filled 2021-08-01: qty 90, 90d supply, fill #0
  Filled 2021-12-07 – 2021-12-28 (×2): qty 90, 90d supply, fill #1

## 2021-08-01 NOTE — Telephone Encounter (Signed)
Pended for your approval or denial.    Pharmacy states this was discontinued by Dr French Ana McLean-Scocuzza on 07/14/21

## 2021-08-18 ENCOUNTER — Other Ambulatory Visit: Payer: Self-pay

## 2021-08-18 ENCOUNTER — Other Ambulatory Visit: Payer: Self-pay | Admitting: Internal Medicine

## 2021-08-18 DIAGNOSIS — F900 Attention-deficit hyperactivity disorder, predominantly inattentive type: Secondary | ICD-10-CM

## 2021-08-18 MED ORDER — AMPHETAMINE-DEXTROAMPHET ER 30 MG PO CP24
30.0000 mg | ORAL_CAPSULE | ORAL | 0 refills | Status: DC
  Filled 2021-08-18: qty 30, 30d supply, fill #0

## 2021-08-18 NOTE — Telephone Encounter (Signed)
Refill sent to pharmacy.   

## 2021-08-18 NOTE — Telephone Encounter (Signed)
Refilled: 07/14/2021 Last OV: 07/14/2021 Next OV: 01/18/2022

## 2021-08-21 ENCOUNTER — Other Ambulatory Visit: Payer: Self-pay

## 2021-09-29 ENCOUNTER — Other Ambulatory Visit: Payer: Self-pay

## 2021-09-29 ENCOUNTER — Other Ambulatory Visit: Payer: Self-pay | Admitting: Family Medicine

## 2021-09-29 DIAGNOSIS — F900 Attention-deficit hyperactivity disorder, predominantly inattentive type: Secondary | ICD-10-CM

## 2021-12-07 ENCOUNTER — Other Ambulatory Visit: Payer: Self-pay | Admitting: Family Medicine

## 2021-12-07 DIAGNOSIS — F900 Attention-deficit hyperactivity disorder, predominantly inattentive type: Secondary | ICD-10-CM

## 2021-12-07 NOTE — Telephone Encounter (Signed)
Did pt see Dr. Maryruth Bun for psychiatry to take over adderall  I was not long term filling this for him?

## 2021-12-08 ENCOUNTER — Other Ambulatory Visit: Payer: Self-pay

## 2021-12-25 ENCOUNTER — Emergency Department (HOSPITAL_BASED_OUTPATIENT_CLINIC_OR_DEPARTMENT_OTHER)
Admission: EM | Admit: 2021-12-25 | Discharge: 2021-12-25 | Disposition: A | Discharge status: Home / Self Care | Payer: 59 | Attending: Emergency Medicine | Admitting: Emergency Medicine

## 2021-12-25 ENCOUNTER — Encounter (HOSPITAL_BASED_OUTPATIENT_CLINIC_OR_DEPARTMENT_OTHER): Payer: Self-pay | Admitting: Urology

## 2021-12-25 ENCOUNTER — Other Ambulatory Visit: Payer: Self-pay

## 2021-12-25 DIAGNOSIS — Z79899 Other long term (current) drug therapy: Secondary | ICD-10-CM | POA: Insufficient documentation

## 2021-12-25 DIAGNOSIS — Z8616 Personal history of COVID-19: Secondary | ICD-10-CM | POA: Insufficient documentation

## 2021-12-25 DIAGNOSIS — R509 Fever, unspecified: Secondary | ICD-10-CM | POA: Diagnosis present

## 2021-12-25 DIAGNOSIS — Z20822 Contact with and (suspected) exposure to covid-19: Secondary | ICD-10-CM | POA: Insufficient documentation

## 2021-12-25 DIAGNOSIS — J069 Acute upper respiratory infection, unspecified: Secondary | ICD-10-CM | POA: Diagnosis not present

## 2021-12-25 DIAGNOSIS — B9789 Other viral agents as the cause of diseases classified elsewhere: Secondary | ICD-10-CM | POA: Diagnosis not present

## 2021-12-25 LAB — RESP PANEL BY RT-PCR (FLU A&B, COVID) ARPGX2
Influenza A by PCR: NEGATIVE
Influenza B by PCR: NEGATIVE
SARS Coronavirus 2 by RT PCR: NEGATIVE

## 2021-12-25 NOTE — ED Triage Notes (Signed)
Headache, body aches, fever, chills since Friday Denies N/V Productive cough and sore throat Ear fullness

## 2021-12-25 NOTE — Discharge Instructions (Addendum)
You were seen in the emergency department today for flulike symptoms.  As we discussed your COVID and flu tests were negative.  I think your symptoms are likely related to a different viral illness.  We normally treat this with over-the-counter medications like ibuprofen or Tylenol as needed for fever pain.  I also recommend Chloraseptic spray or lozenges for sore throat.  Continue to monitor how you're doing and return to the ER for new or worsening symptoms such as difficulty breathing, fever despite medication, inability to swallow.   It has been a pleasure seeing and caring for you today and I hope you start feeling better soon!

## 2021-12-25 NOTE — ED Provider Notes (Signed)
[facility] EMERGENCY DEPARTMENT Provider Note   CSN: [id number] Arrival date & time: 12/25/21  1800     History  Chief Complaint  Patient presents with   flu like symptoms    Cory Gomez is a 41 y.o. male with history of ADHD, anxiety and depression, and prediabetes who presents the emergency department complaining of flulike symptoms for 3 days.  Patient is complaining of headache, body aches, fever, chills, productive cough, sore throat, and ear fullness.  He has been taking over-the-counter medications with minimal relief.  He states that he is here to "rule out the bad things".  HPI  Past Medical History:  Diagnosis Date   ADHD (attention deficit hyperactivity disorder)    Anxiety and depression    COVID-19    12/28/19   Insomnia    Obesity (BMI 30-39.9)    Prediabetes         Home Medications Prior to Admission medications   Medication Sig Start Date End Date Taking? Authorizing Provider  amphetamine-dextroamphetamine (ADDERALL XR) 30 MG 24 hr capsule TAKE 1 CAPSULE BY MOUTH EVERY MORNING. 03/16/21 09/12/21  Pucilowski, Olgierd A, MD  amphetamine-dextroamphetamine (ADDERALL XR) 30 MG 24 hr capsule TAKE 1 CAPSULE (30 MG TOTAL) BY MOUTH EVERY MORNING. FILL 05/14/21 Patient not taking: Reported on 07/14/2021 03/16/21 09/12/21  Pucilowski, Olgierd A, MD  amphetamine-dextroamphetamine (ADDERALL XR) 30 MG 24 hr capsule TAKE 1 CAPSULE (30 MG TOTAL) BY MOUTH EVERY MORNING. FILL 04/14/21 Patient not taking: Reported on 07/14/2021 03/16/21 09/12/21  Pucilowski, Roosvelt Maser, MD  amphetamine-dextroamphetamine (ADDERALL XR) 30 MG 24 hr capsule Take 1 capsule (30 mg total) by mouth every morning. 2/3 07/14/21 10/29/21  McLean-Scocuzza, Pasty Spillers, MD  amphetamine-dextroamphetamine (ADDERALL XR) 30 MG 24 hr capsule Take 1 capsule (30 mg total) by mouth every morning. Rx 3/3 07/14/21 08/13/21  McLean-Scocuzza, Pasty Spillers, MD  amphetamine-dextroamphetamine (ADDERALL XR) 30 MG 24 hr capsule  Take 1 capsule (30 mg total) by mouth every morning. Rx 1/3 08/18/21 09/17/21  Glori Luis, MD  Benzoyl Peroxide 10 % LIQD Apply 1 Bottle topically daily as needed. Acne Be careful may bleach clothing 09/08/19   McLean-Scocuzza, Pasty Spillers, MD  Cholecalciferol 1.25 MG (50000 UT) capsule Take 1 capsule (50,000 Units total) by mouth once a week. 06/20/20   McLean-Scocuzza, Pasty Spillers, MD  clindamycin (CLEOCIN-T) 1 % external solution Apply topically 2 (two) times daily for acne on face and trunk 07/23/21   McLean-Scocuzza, Pasty Spillers, MD  DULoxetine (CYMBALTA) 60 MG capsule Take 1 capsule (60 mg total) by mouth daily. Further refills Dr. Maryruth Bun 08/01/21 01/28/22  McLean-Scocuzza, Pasty Spillers, MD  zolpidem (AMBIEN CR) 12.5 MG CR tablet Take 1 tablet (12.5 mg total) by mouth at bedtime as needed for sleep. 07/14/21 08/13/21  McLean-Scocuzza, Pasty Spillers, MD      Allergies    Patient has no known allergies.    Review of Systems   Review of Systems  Constitutional:  Positive for chills. Negative for fever.  HENT:  Positive for congestion, ear pain and sore throat. Negative for trouble swallowing.   Respiratory:  Positive for cough. Negative for shortness of breath.   Cardiovascular:  Negative for chest pain.  Gastrointestinal:  Negative for abdominal pain, constipation, diarrhea, nausea and vomiting.  Genitourinary:  Negative for dysuria and flank pain.  All other systems reviewed and are negative.  Physical Exam Updated Vital Signs BP (!) 144/88 (BP Location: Right Arm)    Pulse 98  Temp 98.3 F (36.8 C) (Oral)    Resp 18    Ht 6\' 2"  (1.88 m)    Wt (!) 140.6 kg    SpO2 98%    BMI 39.80 kg/m  Physical Exam Vitals and nursing note reviewed.  Constitutional:      Appearance: Normal appearance.  HENT:     Head: Normocephalic and atraumatic.  Eyes:     Conjunctiva/sclera: Conjunctivae normal.  Cardiovascular:     Rate and Rhythm: Normal rate and regular rhythm.  Pulmonary:     Effort: Pulmonary effort is  normal. No respiratory distress.     Breath sounds: Normal breath sounds.  Abdominal:     General: There is no distension.     Palpations: Abdomen is soft.     Tenderness: There is no abdominal tenderness.  Skin:    General: Skin is warm and dry.  Neurological:     General: No focal deficit present.     Mental Status: He is alert.    ED Results / Procedures / Treatments   Labs (all labs ordered are listed, but only abnormal results are displayed) Labs Reviewed  RESP PANEL BY RT-PCR (FLU A&B, COVID) [id number]    EKG None  Radiology No results found.  Procedures Procedures    Medications Ordered in ED Medications - No data to display  ED Course/ Medical Decision Making/ A&P                           Medical Decision Making Patient is a 41 year old male who presents to the emergency department complaining of flulike symptoms x3 days.  On exam patient is afebrile, not tachycardic, not hypoxic, no acute distress.  His COVID and flu testing is negative.  He overall appears clinically well.  While he does have a productive cough, his lung sounds are clear to auscultation bilaterally, with good oxygen saturation.  I have low suspicion for pneumonia, so will defer chest imaging at this time.  Overall patient symptoms most likely related to viral infection with cough.  I do not believe he is requiring admission or inpatient treatment for his symptoms.  We will treat outpatient with symptomatic management.  Discussed reasons to return to the emergency department, and patient is agreeable to the plan.  Final Clinical Impression(s) / ED Diagnoses Final diagnoses:  Viral URI with cough    Rx / DC Orders ED Discharge Orders     None      Portions of this report may have been transcribed using voice recognition software. Every effort was made to ensure accuracy; however, inadvertent computerized transcription errors may be present.     20 12/26/21  0002    02/23/22, MD 12/31/21 [phone]

## 2021-12-28 ENCOUNTER — Other Ambulatory Visit: Payer: Self-pay | Admitting: Family Medicine

## 2021-12-28 ENCOUNTER — Other Ambulatory Visit (HOSPITAL_COMMUNITY): Payer: Self-pay

## 2021-12-28 DIAGNOSIS — F900 Attention-deficit hyperactivity disorder, predominantly inattentive type: Secondary | ICD-10-CM

## 2021-12-29 ENCOUNTER — Other Ambulatory Visit (HOSPITAL_COMMUNITY): Payer: Self-pay

## 2021-12-29 ENCOUNTER — Telehealth: Payer: Self-pay | Admitting: Internal Medicine

## 2021-12-29 ENCOUNTER — Other Ambulatory Visit: Payer: Self-pay

## 2021-12-29 ENCOUNTER — Emergency Department (HOSPITAL_COMMUNITY): Payer: 59

## 2021-12-29 ENCOUNTER — Encounter (HOSPITAL_COMMUNITY): Payer: Self-pay

## 2021-12-29 ENCOUNTER — Inpatient Hospital Stay (HOSPITAL_COMMUNITY)
Admission: EM | Admit: 2021-12-29 | Discharge: 2022-01-01 | DRG: 153 | Disposition: A | Discharge status: Home / Self Care | Payer: 59 | Attending: Internal Medicine | Admitting: Internal Medicine

## 2021-12-29 DIAGNOSIS — Z20822 Contact with and (suspected) exposure to covid-19: Secondary | ICD-10-CM | POA: Diagnosis present

## 2021-12-29 DIAGNOSIS — Z8616 Personal history of COVID-19: Secondary | ICD-10-CM | POA: Diagnosis not present

## 2021-12-29 DIAGNOSIS — Z823 Family history of stroke: Secondary | ICD-10-CM

## 2021-12-29 DIAGNOSIS — R7303 Prediabetes: Secondary | ICD-10-CM | POA: Diagnosis present

## 2021-12-29 DIAGNOSIS — H7092 Unspecified mastoiditis, left ear: Secondary | ICD-10-CM | POA: Diagnosis not present

## 2021-12-29 DIAGNOSIS — F988 Other specified behavioral and emotional disorders with onset usually occurring in childhood and adolescence: Secondary | ICD-10-CM | POA: Diagnosis present

## 2021-12-29 DIAGNOSIS — Z833 Family history of diabetes mellitus: Secondary | ICD-10-CM

## 2021-12-29 DIAGNOSIS — F411 Generalized anxiety disorder: Secondary | ICD-10-CM | POA: Diagnosis present

## 2021-12-29 DIAGNOSIS — Z83438 Family history of other disorder of lipoprotein metabolism and other lipidemia: Secondary | ICD-10-CM | POA: Diagnosis not present

## 2021-12-29 DIAGNOSIS — A881 Epidemic vertigo: Secondary | ICD-10-CM | POA: Diagnosis not present

## 2021-12-29 DIAGNOSIS — E785 Hyperlipidemia, unspecified: Secondary | ICD-10-CM | POA: Diagnosis present

## 2021-12-29 DIAGNOSIS — H7093 Unspecified mastoiditis, bilateral: Secondary | ICD-10-CM

## 2021-12-29 DIAGNOSIS — I10 Essential (primary) hypertension: Secondary | ICD-10-CM | POA: Diagnosis not present

## 2021-12-29 DIAGNOSIS — Z6372 Alcoholism and drug addiction in family: Secondary | ICD-10-CM | POA: Diagnosis not present

## 2021-12-29 DIAGNOSIS — J329 Chronic sinusitis, unspecified: Secondary | ICD-10-CM | POA: Diagnosis present

## 2021-12-29 DIAGNOSIS — F3341 Major depressive disorder, recurrent, in partial remission: Secondary | ICD-10-CM | POA: Diagnosis present

## 2021-12-29 DIAGNOSIS — R519 Headache, unspecified: Secondary | ICD-10-CM | POA: Diagnosis present

## 2021-12-29 DIAGNOSIS — F900 Attention-deficit hyperactivity disorder, predominantly inattentive type: Secondary | ICD-10-CM | POA: Diagnosis not present

## 2021-12-29 DIAGNOSIS — F909 Attention-deficit hyperactivity disorder, unspecified type: Secondary | ICD-10-CM | POA: Diagnosis present

## 2021-12-29 DIAGNOSIS — G4489 Other headache syndrome: Secondary | ICD-10-CM | POA: Diagnosis not present

## 2021-12-29 DIAGNOSIS — S0990XA Unspecified injury of head, initial encounter: Secondary | ICD-10-CM | POA: Diagnosis not present

## 2021-12-29 DIAGNOSIS — R42 Dizziness and giddiness: Secondary | ICD-10-CM

## 2021-12-29 DIAGNOSIS — Z811 Family history of alcohol abuse and dependence: Secondary | ICD-10-CM

## 2021-12-29 DIAGNOSIS — E669 Obesity, unspecified: Secondary | ICD-10-CM | POA: Diagnosis present

## 2021-12-29 DIAGNOSIS — Z79899 Other long term (current) drug therapy: Secondary | ICD-10-CM | POA: Diagnosis not present

## 2021-12-29 DIAGNOSIS — H7091 Unspecified mastoiditis, right ear: Secondary | ICD-10-CM | POA: Diagnosis not present

## 2021-12-29 DIAGNOSIS — Z6841 Body Mass Index (BMI) 40.0 and over, adult: Secondary | ICD-10-CM | POA: Diagnosis not present

## 2021-12-29 DIAGNOSIS — Z8249 Family history of ischemic heart disease and other diseases of the circulatory system: Secondary | ICD-10-CM

## 2021-12-29 DIAGNOSIS — F339 Major depressive disorder, recurrent, unspecified: Secondary | ICD-10-CM | POA: Diagnosis not present

## 2021-12-29 DIAGNOSIS — J0140 Acute pansinusitis, unspecified: Secondary | ICD-10-CM | POA: Diagnosis not present

## 2021-12-29 DIAGNOSIS — J320 Chronic maxillary sinusitis: Secondary | ICD-10-CM | POA: Diagnosis not present

## 2021-12-29 DIAGNOSIS — Z818 Family history of other mental and behavioral disorders: Secondary | ICD-10-CM

## 2021-12-29 DIAGNOSIS — J323 Chronic sphenoidal sinusitis: Secondary | ICD-10-CM | POA: Diagnosis not present

## 2021-12-29 DIAGNOSIS — J3489 Other specified disorders of nose and nasal sinuses: Secondary | ICD-10-CM | POA: Diagnosis not present

## 2021-12-29 LAB — RESP PANEL BY RT-PCR (FLU A&B, COVID) ARPGX2
Influenza A by PCR: NEGATIVE
Influenza B by PCR: NEGATIVE
SARS Coronavirus 2 by RT PCR: NEGATIVE

## 2021-12-29 MED ORDER — AMPHETAMINE-DEXTROAMPHET ER 30 MG PO CP24
30.0000 mg | ORAL_CAPSULE | ORAL | 0 refills | Status: DC
  Filled 2021-12-29: qty 30, 30d supply, fill #0

## 2021-12-29 NOTE — ED Triage Notes (Addendum)
Per EMS- Patient c/o dizziness x 5-6 days. [facility] went to [facility] and reports they did nt find anything wrong. Patient states he began vomiting today and BP elevated.  Patient added during triage that closing his eyes made the dizziness better.

## 2021-12-29 NOTE — ED Provider Triage Note (Signed)
Emergency Medicine Provider Triage Evaluation Note  Cory Gomez , a 41 y.o. male  was evaluated in triage.  Pt presenting with his wife with a complaint of dizziness, nausea and vomiting.  Also reporting difficulty speaking.  Patient was seen 4 days ago for URI symptoms and was found to be negative for COVID and the flu.  He never felt better after being discharged and today at work began to feel nauseated, dizzy and felt like he lost his ability to speak and understand.  Review of Systems  Patient not very participatory in his history taking.  Reporting a change in mentation which is confirmed by his wife.  Patient is with photophobia and will not open his eyes and participate in full neuro testing.  Physical Exam  BP (!) 150/93    Pulse 89    Temp 98 F (36.7 C) (Oral)    Resp 18    Ht 6\' 2"  (1.88 m)    Wt (!) 140.6 kg    SpO2 98%    BMI 39.80 kg/m  Gen:   Awake, no distress   Resp:  Normal effort  MSK:   Moves extremities without difficulty  Other:    Medical Decision Making  Medically screening exam initiated at 6:55 PM.  Appropriate orders placed.  Cory Gomez was informed that the remainder of the evaluation will be completed by another provider, this initial triage assessment does not replace that evaluation, and the importance of remaining in the ED until their evaluation is complete.   CT scan ordered   Cory Sarin, PA-C 12/29/21 1858

## 2021-12-29 NOTE — Telephone Encounter (Signed)
Last OV 07/14/21 last filled 08/21/21 requesting refill adderall XR

## 2021-12-29 NOTE — Telephone Encounter (Signed)
Id put in referral in 06/2021 for pt to see psychiatry for all mental health meds  Did he ever establish? Last fill of adderall from PCP needs to see psych

## 2021-12-30 ENCOUNTER — Emergency Department (HOSPITAL_COMMUNITY): Payer: 59

## 2021-12-30 ENCOUNTER — Other Ambulatory Visit (HOSPITAL_COMMUNITY): Payer: Self-pay

## 2021-12-30 DIAGNOSIS — F900 Attention-deficit hyperactivity disorder, predominantly inattentive type: Secondary | ICD-10-CM

## 2021-12-30 DIAGNOSIS — E785 Hyperlipidemia, unspecified: Secondary | ICD-10-CM | POA: Diagnosis not present

## 2021-12-30 DIAGNOSIS — J0140 Acute pansinusitis, unspecified: Secondary | ICD-10-CM | POA: Diagnosis not present

## 2021-12-30 DIAGNOSIS — F411 Generalized anxiety disorder: Secondary | ICD-10-CM | POA: Diagnosis not present

## 2021-12-30 DIAGNOSIS — J3489 Other specified disorders of nose and nasal sinuses: Secondary | ICD-10-CM | POA: Diagnosis not present

## 2021-12-30 DIAGNOSIS — H7093 Unspecified mastoiditis, bilateral: Secondary | ICD-10-CM

## 2021-12-30 DIAGNOSIS — F339 Major depressive disorder, recurrent, unspecified: Secondary | ICD-10-CM

## 2021-12-30 DIAGNOSIS — R42 Dizziness and giddiness: Secondary | ICD-10-CM | POA: Diagnosis not present

## 2021-12-30 DIAGNOSIS — Z6841 Body Mass Index (BMI) 40.0 and over, adult: Secondary | ICD-10-CM

## 2021-12-30 DIAGNOSIS — R519 Headache, unspecified: Secondary | ICD-10-CM

## 2021-12-30 DIAGNOSIS — J329 Chronic sinusitis, unspecified: Secondary | ICD-10-CM | POA: Diagnosis present

## 2021-12-30 LAB — CBC WITH DIFFERENTIAL/PLATELET
Abs Immature Granulocytes: 0.02 10*3/uL (ref 0.00–0.07)
Basophils Absolute: 0 10*3/uL (ref 0.0–0.1)
Basophils Relative: 1 %
Eosinophils Absolute: 0.2 10*3/uL (ref 0.0–0.5)
Eosinophils Relative: 2 %
HCT: 37.1 % — ABNORMAL LOW (ref 39.0–52.0)
Hemoglobin: 12.4 g/dL — ABNORMAL LOW (ref 13.0–17.0)
Immature Granulocytes: 0 %
Lymphocytes Relative: 25 %
Lymphs Abs: 1.7 10*3/uL (ref 0.7–4.0)
MCH: 30.2 pg (ref 26.0–34.0)
MCHC: 33.4 g/dL (ref 30.0–36.0)
MCV: 90.3 fL (ref 80.0–100.0)
Monocytes Absolute: 0.6 10*3/uL (ref 0.1–1.0)
Monocytes Relative: 9 %
Neutro Abs: 4.5 10*3/uL (ref 1.7–7.7)
Neutrophils Relative %: 63 %
Platelets: 291 10*3/uL (ref 150–400)
RBC: 4.11 MIL/uL — ABNORMAL LOW (ref 4.22–5.81)
RDW: 12.8 % (ref 11.5–15.5)
WBC: 7 10*3/uL (ref 4.0–10.5)
nRBC: 0 % (ref 0.0–0.2)

## 2021-12-30 LAB — BASIC METABOLIC PANEL
Anion gap: 5 (ref 5–15)
BUN: 14 mg/dL (ref 6–20)
CO2: 25 mmol/L (ref 22–32)
Calcium: 8.6 mg/dL — ABNORMAL LOW (ref 8.9–10.3)
Chloride: 103 mmol/L (ref 98–111)
Creatinine, Ser: 0.77 mg/dL (ref 0.61–1.24)
GFR, Estimated: >60 mL/min (ref >60)
Glucose, Bld: 86 mg/dL (ref 70–99)
Potassium: 3.7 mmol/L (ref 3.5–5.1)
Sodium: 133 mmol/L — ABNORMAL LOW (ref 135–145)

## 2021-12-30 LAB — ETHANOL: Alcohol, Ethyl (B): <10 mg/dL (ref <10)

## 2021-12-30 LAB — CBG MONITORING, ED: Glucose-Capillary: 80 mg/dL (ref 70–99)

## 2021-12-30 MED ORDER — HYDRALAZINE HCL 20 MG/ML IJ SOLN
10.0000 mg | Freq: Four times a day (QID) | INTRAMUSCULAR | Status: DC | PRN
  Administered 2022-01-01: 10 mg via INTRAVENOUS
  Filled 2021-12-30: qty 1

## 2021-12-30 MED ORDER — IOHEXOL 350 MG/ML SOLN
80.0000 mL | Freq: Once | INTRAVENOUS | Status: AC | PRN
  Administered 2021-12-30: 80 mL via INTRAVENOUS

## 2021-12-30 MED ORDER — KETOROLAC TROMETHAMINE 30 MG/ML IJ SOLN
30.0000 mg | Freq: Once | INTRAMUSCULAR | Status: DC
  Filled 2021-12-30: qty 1

## 2021-12-30 MED ORDER — LORATADINE 10 MG PO TABS
10.0000 mg | ORAL_TABLET | Freq: Every day | ORAL | Status: DC
  Administered 2021-12-30 – 2022-01-01 (×3): 10 mg via ORAL
  Filled 2021-12-30 (×3): qty 1

## 2021-12-30 MED ORDER — SODIUM CHLORIDE 0.9 % IV SOLN
3.0000 g | Freq: Four times a day (QID) | INTRAVENOUS | Status: DC
  Administered 2021-12-30 – 2022-01-01 (×8): 3 g via INTRAVENOUS
  Filled 2021-12-30 (×9): qty 8

## 2021-12-30 MED ORDER — CHOLECALCIFEROL 1.25 MG (50000 UT) PO CAPS: 50000.0000 [IU] | ORAL_CAPSULE | ORAL | Status: DC

## 2021-12-30 MED ORDER — DULOXETINE HCL 30 MG PO CPEP
60.0000 mg | ORAL_CAPSULE | Freq: Every day | ORAL | Status: DC
  Administered 2021-12-30 – 2022-01-01 (×3): 60 mg via ORAL
  Filled 2021-12-30 (×3): qty 2

## 2021-12-30 MED ORDER — SENNOSIDES-DOCUSATE SODIUM 8.6-50 MG PO TABS: 1.0000 | ORAL_TABLET | Freq: Every evening | ORAL | Status: DC | PRN

## 2021-12-30 MED ORDER — AMPHETAMINE-DEXTROAMPHET ER 10 MG PO CP24
30.0000 mg | ORAL_CAPSULE | Freq: Every morning | ORAL | Status: DC
  Administered 2021-12-31 – 2022-01-01 (×2): 30 mg via ORAL
  Filled 2021-12-30 (×2): qty 3

## 2021-12-30 MED ORDER — SORBITOL 70 % SOLN
30.0000 mL | Freq: Every day | Status: DC | PRN
  Filled 2021-12-30: qty 30

## 2021-12-30 MED ORDER — SODIUM CHLORIDE 0.9% FLUSH
3.0000 mL | Freq: Two times a day (BID) | INTRAVENOUS | Status: DC
  Administered 2021-12-31 – 2022-01-01 (×3): 3 mL via INTRAVENOUS

## 2021-12-30 MED ORDER — PROCHLORPERAZINE EDISYLATE 10 MG/2ML IJ SOLN
10.0000 mg | Freq: Four times a day (QID) | INTRAMUSCULAR | Status: DC | PRN
  Administered 2022-01-01: 10 mg via INTRAVENOUS
  Filled 2021-12-30: qty 2

## 2021-12-30 MED ORDER — ACETAMINOPHEN 650 MG RE SUPP: 650.0000 mg | Freq: Four times a day (QID) | RECTAL | Status: DC | PRN

## 2021-12-30 MED ORDER — DIAZEPAM 5 MG/ML IJ SOLN
5.0000 mg | Freq: Once | INTRAMUSCULAR | Status: AC
  Administered 2021-12-30: 5 mg via INTRAVENOUS
  Filled 2021-12-30: qty 2

## 2021-12-30 MED ORDER — SODIUM CHLORIDE 0.9 % IV SOLN: INTRAVENOUS | Status: DC

## 2021-12-30 MED ORDER — ENOXAPARIN SODIUM 80 MG/0.8ML IJ SOSY
70.0000 mg | PREFILLED_SYRINGE | INTRAMUSCULAR | Status: DC
  Administered 2021-12-30 – 2021-12-31 (×2): 70 mg via SUBCUTANEOUS
  Filled 2021-12-30 (×2): qty 0.8

## 2021-12-30 MED ORDER — ACETAMINOPHEN 325 MG PO TABS: 650.0000 mg | ORAL_TABLET | Freq: Four times a day (QID) | ORAL | Status: DC | PRN

## 2021-12-30 MED ORDER — FLUTICASONE PROPIONATE 50 MCG/ACT NA SUSP
2.0000 | Freq: Every day | NASAL | Status: DC
  Administered 2021-12-31 – 2022-01-01 (×2): 2 via NASAL
  Filled 2021-12-30: qty 16

## 2021-12-30 MED ORDER — KETOROLAC TROMETHAMINE 30 MG/ML IJ SOLN
30.0000 mg | Freq: Four times a day (QID) | INTRAMUSCULAR | Status: DC | PRN
  Administered 2021-12-30 – 2022-01-01 (×3): 30 mg via INTRAVENOUS
  Filled 2021-12-30 (×2): qty 1

## 2021-12-30 MED ORDER — FLEET ENEMA 7-19 GM/118ML RE ENEM: 1.0000 | ENEMA | Freq: Once | RECTAL | Status: DC | PRN

## 2021-12-30 MED ORDER — PANTOPRAZOLE SODIUM 40 MG PO TBEC
40.0000 mg | DELAYED_RELEASE_TABLET | Freq: Every day | ORAL | Status: DC
  Administered 2021-12-30 – 2022-01-01 (×3): 40 mg via ORAL
  Filled 2021-12-30 (×3): qty 1

## 2021-12-30 MED ORDER — SODIUM CHLORIDE 0.9 % IV SOLN
3.0000 g | Freq: Once | INTRAVENOUS | Status: DC
  Filled 2021-12-30: qty 8

## 2021-12-30 NOTE — H&P (Signed)
History and Physical    Cory Gomez WUX:[id number] DOB: 08-27-1981 DOA: 12/29/2021  PCP: McLean-Scocuzza, Pasty Spillers, MD  Patient coming from: Home  I have personally briefly reviewed patient's old medical records in [facility]  Chief Complaint: Dizziness, emesis  HPI: Cory Gomez is a 41 y.o. male with medical history significant of ADHD, depression presented to the ED with a 1 to 2-day history of dizziness, nausea, vomiting, some slurred speech, gait imbalance, some short-term memory loss, complains of a sinus headache, photophobia, phonophobia.  Patient stated had upper respiratory symptoms of congestion, sore throat, some diaphoresis, productive cough of greenish mucus approximately a week ago with some associated fever with a temp as high as 100.3.  Patient is states tried some Tylenol sinus for his sinus headache and pain with no significant improvement and presented to the ED.  Patient stated that 1 day prior to admission when he got off work around 4 PM he felt very dizzy with difficulty getting around to his car called his wife to pick him up and when she got there as he walked to the car he had nausea and emesis and felt dazed and subsequently fire department was called.  Cannot quite remember anything since then and just remembers being in the ED.  Patient endorses dizziness/lightheadedness, some ear pressure.  Patient denies any recent ear infection.  Patient denies any chest pain, no shortness of breath, no abdominal pain, no diarrhea, no constipation, no dysuria, no melena, no hematemesis, no hematochezia.  ED Course: Patient seen in the ED, basic metabolic profile with a sodium of 133, calcium of 8.6 otherwise was within normal limits.  CBC with a white count of 12.4 otherwise was within normal limits.  COVID-19 PCR negative.  Influenza PCR negative.  Alcohol level < 10.  CT angiogram head done with no large vessel occlusion or proximal hemodynamically significant  arterial stenosis.  No evidence of dural venous sinus thrombosis.  MRI brain done negative for any acute intracranial abnormality, severe paranasal sinus disease suggestive of sinusitis, moderate right and small left mastoid effusions.  Patient given a dose of IV Unasyn.  Hospitalist called to admit the patient for further evaluation and management.  Review of Systems: As per HPI otherwise all other systems reviewed and are negative.  Past Medical History:  Diagnosis Date   ADHD (attention deficit hyperactivity disorder)    Anxiety and depression    COVID-19    12/28/19   Insomnia    Obesity (BMI 30-39.9)    Prediabetes     Past Surgical History:  Procedure Laterality Date   NO PAST SURGERIES      Social History  reports that he has never smoked. He has never used smokeless tobacco. He reports that he does not currently use alcohol. He reports that he does not currently use drugs.  No Known Allergies  Family History  Problem Relation Age of Onset   Diabetes Mother    Hypertension Mother    Hyperlipidemia Mother    Diabetes Mellitus II Mother    Anxiety disorder Mother    Depression Mother    Stroke Father    Alcoholism Father    Anxiety disorder Brother    Depression Brother    Hypertension Brother    Hyperlipidemia Brother    Diabetes type II Brother    ADD / ADHD Brother    Dementia Maternal Grandfather    Mother alive age 56 with diabetes, hypertension, depression, ADD.  Father deceased age 57 in his sleep presumed heart attack.  Patient and mother.  Prior to Admission medications   Medication Sig Start Date End Date Taking? Authorizing Provider  amphetamine-dextroamphetamine (ADDERALL XR) 30 MG 24 hr capsule TAKE 1 CAPSULE BY MOUTH EVERY MORNING. 03/16/21 09/12/21  Pucilowski, Olgierd A, MD  amphetamine-dextroamphetamine (ADDERALL XR) 30 MG 24 hr capsule TAKE 1 CAPSULE (30 MG TOTAL) BY MOUTH EVERY MORNING. FILL 05/14/21 Patient not taking: Reported on 07/14/2021 03/16/21  09/12/21  Pucilowski, Olgierd A, MD  amphetamine-dextroamphetamine (ADDERALL XR) 30 MG 24 hr capsule TAKE 1 CAPSULE (30 MG TOTAL) BY MOUTH EVERY MORNING. FILL 04/14/21 Patient not taking: Reported on 07/14/2021 03/16/21 09/12/21  Pucilowski, Roosvelt Maser, MD  amphetamine-dextroamphetamine (ADDERALL XR) 30 MG 24 hr capsule Take 1 capsule (30 mg total) by mouth every morning. 2/3 07/14/21 10/29/21  McLean-Scocuzza, Pasty Spillers, MD  amphetamine-dextroamphetamine (ADDERALL XR) 30 MG 24 hr capsule Take 1 capsule (30 mg total) by mouth every morning. Rx 3/3 07/14/21 08/13/21  McLean-Scocuzza, Pasty Spillers, MD  amphetamine-dextroamphetamine (ADDERALL XR) 30 MG 24 hr capsule Take 1 capsule (30 mg total) by mouth every morning. Further refills psychiatry 12/29/21 01/28/22  McLean-Scocuzza, Pasty Spillers, MD  Benzoyl Peroxide 10 % LIQD Apply 1 Bottle topically daily as needed. Acne Be careful may bleach clothing 09/08/19   McLean-Scocuzza, Pasty Spillers, MD  Cholecalciferol 1.25 MG (50000 UT) capsule Take 1 capsule (50,000 Units total) by mouth once a week. 06/20/20   McLean-Scocuzza, Pasty Spillers, MD  clindamycin (CLEOCIN-T) 1 % external solution Apply topically 2 (two) times daily for acne on face and trunk 07/23/21   McLean-Scocuzza, Pasty Spillers, MD  DULoxetine (CYMBALTA) 60 MG capsule Take 1 capsule (60 mg total) by mouth daily. Further refills Dr. Maryruth Bun 08/01/21 03/29/22  McLean-Scocuzza, Pasty Spillers, MD  zolpidem (AMBIEN CR) 12.5 MG CR tablet Take 1 tablet (12.5 mg total) by mouth at bedtime as needed for sleep. 07/14/21 01/28/22  McLean-Scocuzza, Pasty Spillers, MD    Physical Exam: Vitals:   12/30/21 1500 12/30/21 1545 12/30/21 1630 12/30/21 1700  BP: (!) 146/95 (!) 142/89 (!) 144/95 (!) 141/91  Pulse: 76 81 74 88  Resp: 15 13 18 17   Temp:      TempSrc:      SpO2: 100% 100% 98% 99%  Weight:      Height:        Constitutional: NAD, calm, comfortable Vitals:   12/30/21 1500 12/30/21 1545 12/30/21 1630 12/30/21 1700  BP: (!) 146/95 (!) 142/89 (!) 144/95 (!)  141/91  Pulse: 76 81 74 88  Resp: 15 13 18 17   Temp:      TempSrc:      SpO2: 100% 100% 98% 99%  Weight:      Height:       Eyes: PERRL, lids and conjunctivae normal ENMT: Mucous membranes are dry. Posterior pharynx clear of any exudate or lesions.Normal dentition.  Maxillary sinus and frontal sinuses tender to palpation bilaterally.  Tympanic membranes clear, pinnae nontender to palpation. Neck: normal, supple, no masses, no thyromegaly, some tenderness to palpation. Respiratory: clear to auscultation bilaterally, no wheezing, no crackles. Normal respiratory effort. No accessory muscle use.  Cardiovascular: Regular rate and rhythm, no murmurs / rubs / gallops. No extremity edema. 2+ pedal pulses. No carotid bruits.  Abdomen: no tenderness, no masses palpated. No hepatosplenomegaly. Bowel sounds positive.  Musculoskeletal: no clubbing / cyanosis. No joint deformity upper and lower extremities. Good ROM, no contractures. Normal muscle tone.  Skin: no  rashes, lesions, ulcers. No induration Neurologic: CN 2-12 grossly intact. Sensation intact, Strength 5/5 in all 4.  Psychiatric: Normal judgment and insight. Alert and oriented x 3. Normal mood.   Labs on Admission: I have personally reviewed following labs and imaging studies  CBC: Recent Labs  Lab 12/30/21 1338  WBC 7.0  NEUTROABS 4.5  HGB 12.4*  HCT 37.1*  MCV 90.3  PLT 291    Basic Metabolic Panel: Recent Labs  Lab 12/30/21 1338  NA 133*  K 3.7  CL 103  CO2 25  GLUCOSE 86  BUN 14  CREATININE 0.77  CALCIUM 8.6*    GFR: Estimated Creatinine Clearance: 187 mL/min (by C-G formula based on SCr of 0.77 mg/dL).  Liver Function Tests: No results for input(s): AST, ALT, ALKPHOS, BILITOT, PROT, ALBUMIN in the last 168 hours.  Urine analysis:    Component Value Date/Time   COLORURINE YELLOW 06/14/2021 0800   APPEARANCEUR CLOUDY (A) 06/14/2021 0800   LABSPEC 1.025 06/14/2021 0800   PHURINE 5.5 06/14/2021 0800    GLUCOSEU NEGATIVE 06/14/2021 0800   HGBUR NEGATIVE 06/14/2021 0800   KETONESUR NEGATIVE 06/14/2021 0800   PROTEINUR NEGATIVE 06/14/2021 0800   NITRITE NEGATIVE 06/14/2021 0800   LEUKOCYTESUR NEGATIVE 06/14/2021 0800    Radiological Exams on Admission: CT Angio Head W/Cm &/Or Wo Cm  Result Date: 12/30/2021 CLINICAL DATA:  Headache, sudden, severe; Dural venous sinus thrombosis suspected Headache, chronic, new features or increased frequency EXAM: CT ANGIOGRAPHY HEAD CT VENOGRAM HEAD TECHNIQUE: Multidetector CT imaging of the head was performed using the standard protocol during bolus administration of intravenous contrast. Multiplanar CT image reconstructions and MIPs were obtained to evaluate the vascular anatomy. Venographic phase images of the brain were obtained following the administration of intravenous contrast. Multiplanar reformats and maximum intensity projections were generated. CONTRAST:  80mL OMNIPAQUE IOHEXOL 350 MG/ML SOLN COMPARISON:  Same day CT head FINDINGS: CTA HEAD Anterior circulation: Bilateral intracranial ICAs, MCAs and ACAs are patent without proximal hemodynamically significant stenosis. No aneurysm identified. Posterior circulation: Bilateral intradural vertebral arteries, basilar artery, and posterior cerebral arteries are patent without proximal hemodynamically significant stenosis. No aneurysm identified. Venous sinuses: See below. Review of the MIP images confirms the above findings. CT VENOGRAM No evidence of dural venous sinus thrombosis. The superior sagittal sinus transverse, sigmoid and straight sinuses appear patent. The visible deep cerebral veins appear patent. IMPRESSION: 1. No large vessel occlusion or proximal hemodynamically significant arterial stenosis. 2. No evidence of dural venous sinus thrombosis. Electronically Signed   By: Feliberto Harts M.D.   On: 12/30/2021 16:24   CT Head Wo Contrast  Result Date: 12/29/2021 CLINICAL DATA:  Status post trauma.  EXAM: CT HEAD WITHOUT CONTRAST TECHNIQUE: Contiguous axial images were obtained from the base of the skull through the vertex without intravenous contrast. COMPARISON:  None. FINDINGS: Brain: No evidence of acute infarction, hemorrhage, hydrocephalus, extra-axial collection or mass lesion/mass effect. Vascular: No hyperdense vessel or unexpected calcification. Skull: Normal. Negative for fracture or focal lesion. Sinuses/Orbits: There is marked severity bilateral maxillary sinus, bilateral ethmoid sinus and sphenoid sinus mucosal thickening. Other: None. IMPRESSION: 1. No acute intracranial abnormality. 2. Marked severity bilateral maxillary sinus, bilateral ethmoid sinus and sphenoid sinus disease. Electronically Signed   By: Aram Candela M.D.   On: 12/29/2021 19:30   MR Brain Wo Contrast (neuro protocol)  Result Date: 12/30/2021 CLINICAL DATA:  Dizziness, persistent/recurrent, cardiac or vascular cause suspected EXAM: MRI HEAD WITHOUT CONTRAST TECHNIQUE: Multiplanar, multiecho pulse sequences of the  brain and surrounding structures were obtained without intravenous contrast. COMPARISON:  CT head 12/29/2021. FINDINGS: Mildly motion limited study. Brain: No acute infarction, hemorrhage, hydrocephalus, extra-axial collection or mass lesion. Vascular: Major arterial flow voids are maintained at the skull base. Skull and upper cervical spine: Normal marrow signal. Sinuses/Orbits: Near complete opacification bilateral maxillary sinuses, sphenoid sinuses. Partial fat opacification of scattered ethmoid air cells with pansinus mucosal thickening. Other: Moderate right and small left mastoid effusions. IMPRESSION: 1. No evidence of acute intracranial abnormality. 2. Severe paranasal sinus disease, suggestive of sinusitis. 3. Moderate right and small left mastoid effusions. Electronically Signed   By: Feliberto Harts M.D.   On: 12/30/2021 14:54   CT VENOGRAM HEAD  Result Date: 12/30/2021 CLINICAL DATA:   Headache, sudden, severe; Dural venous sinus thrombosis suspected Headache, chronic, new features or increased frequency EXAM: CT ANGIOGRAPHY HEAD CT VENOGRAM HEAD TECHNIQUE: Multidetector CT imaging of the head was performed using the standard protocol during bolus administration of intravenous contrast. Multiplanar CT image reconstructions and MIPs were obtained to evaluate the vascular anatomy. Venographic phase images of the brain were obtained following the administration of intravenous contrast. Multiplanar reformats and maximum intensity projections were generated. CONTRAST:  80mL OMNIPAQUE IOHEXOL 350 MG/ML SOLN COMPARISON:  Same day CT head FINDINGS: CTA HEAD Anterior circulation: Bilateral intracranial ICAs, MCAs and ACAs are patent without proximal hemodynamically significant stenosis. No aneurysm identified. Posterior circulation: Bilateral intradural vertebral arteries, basilar artery, and posterior cerebral arteries are patent without proximal hemodynamically significant stenosis. No aneurysm identified. Venous sinuses: See below. Review of the MIP images confirms the above findings. CT VENOGRAM No evidence of dural venous sinus thrombosis. The superior sagittal sinus transverse, sigmoid and straight sinuses appear patent. The visible deep cerebral veins appear patent. IMPRESSION: 1. No large vessel occlusion or proximal hemodynamically significant arterial stenosis. 2. No evidence of dural venous sinus thrombosis. Electronically Signed   By: Feliberto Harts M.D.   On: 12/30/2021 16:24    EKG: Independently reviewed.  Not done  Assessment/Plan Principal Problem:   Sinusitis Active Problems:   Obesity (BMI 30-39.9)   Major depressive disorder, recurrent episode, in partial remission (HCC)   ADD (attention deficit disorder)   GAD (generalized anxiety disorder)   Depression, recurrent (HCC)   Hyperlipidemia   Morbid obesity with BMI of 40.0-44.9, adult (HCC)   #1 severe  paranasal/maxillary sinusitis/sphenoid sinusitis -Patient presented with nausea vomiting dizziness sinus headaches, painful maxillary and frontal sinusitis with associated gait imbalance. -Patient with recent upper respiratory infection approximately 1 week prior to admission. -MRI brain done due to initial concern for acute CVA showed severe paranasal sinus disease suggestive of sinusitis, moderate right and small left mastoid effusions. -Patient with no recent ear infection, ear exam unremarkable. -Place empirically on IV Unasyn, Claritin, Flonase, IV fluids, IV antiemetics. -Supportive care.  2.  Bilateral Mastoid effusion -MRI brain done with moderate right and small left mastoid effusions. -Patient with no recent ear infection. -Slight tenderness to palpation in the mastoid region. -Ear exam unremarkable. -Continue empiric treatment for sinusitis with IV antibiotics. -If no significant clinical improvement will need to follow-up with ENT in the outpatient setting.  3.  Nausea/ vomiting /dizziness/gait imbalance. -Likely secondary to problem #1. -MRI brain negative for any acute intracranial abnormalities and consistent with severe paranasal sinus disease. -CT angiogram head with no large vessel occlusion or proximal hemodynamically significant arterial stenosis.  No evidence of dural venous thrombosis. -Continue treatment as in problem #1. -PT vestibular evaluation. -IV antiemetics,  IV fluids, supportive care.  4.  Depression/general anxiety disorder -Resume home regimen Cymbalta.  5.  ADD -Continue home regimen Adderall.  6.  Morbid obesity -Weight loss. -Outpatient follow-up with PCP.  DVT prophylaxis: Lovenox Code Status:   Full Family Communication:  Updated patient, mother, wife at bedside. Disposition Plan:   Patient is from:  Home  Anticipated DC to:  Home  Anticipated DC date:  1 to 2 days  Anticipated DC barriers: Clinical improvement. Consults called:   None Admission status:  Place in observation.  Severity of Illness: The appropriate patient status for this patient is OBSERVATION. Observation status is judged to be reasonable and necessary in order to provide the required intensity of service to ensure the patient's safety. The patient's presenting symptoms, physical exam findings, and initial radiographic and laboratory data in the context of their medical condition is felt to place them at decreased risk for further clinical deterioration. Furthermore, it is anticipated that the patient will be medically stable for discharge from the hospital within 2 midnights of admission.     Ramiro Harvest MD [facility]  How to contact the [facility] Attending or Consulting provider 7A - 7P or covering provider during after hours 7P -7A, for this patient?   Check the care team in [facility] and look for a) attending/consulting [facility] provider listed and b) the [facility] team listed Log into [email or web address] and use [place]'s universal password to access. If you do not have the password, please contact the hospital operator. Locate the [facility] provider you are looking for under [facility] and page to a number that you can be directly reached. If you still have difficulty reaching the provider, please page the [facility] (Director on Call) for the Hospitalists listed on amion for assistance.  12/30/2021, 5:57 PM

## 2021-12-30 NOTE — ED Notes (Signed)
Patient transported to MRI 

## 2021-12-30 NOTE — ED Provider Notes (Signed)
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  With past medical history of prediabetes and hyperlipidemia who presents to the emergency department with dizziness.  Patient states that he got off work yesterday around 4 PM.  He states that he has been dizzy at work and then began to do called his wife to come pick him up because he felt he could not drive.  She states that when she got there the patient walked out of the car however he threw up again and continues to stumble so she called EMS.  He also notes that the patient had "slurred speech and could not smile like a full smile."  Patient states that he does not remember being transported to the hospital.  He does endorse "bad headache that started yesterday."  He states that it feels like a "sinus headache" however on asked to be specific he points to the left temple.  He has associated photophobia and phonophobia.  Again he has associated nausea and vomiting as well as dizziness.  He does state he feels weak but not focally weak in one extremity.  He states that he had an upper respiratory illness last week and also feels like his ears are clogged.  Last fever on Monday which she states was 100.3.  He denies any recent head trauma or neck trauma.  He denies chest pain or shortness of breath.    Dizziness Associated symptoms: headaches, nausea and vomiting   Associated symptoms: no chest pain and no shortness of breath   Emesis Associated symptoms: headaches   Associated symptoms: no abdominal pain and no fever       Home Medications Prior to Admission medications   Medication Sig Start Date End Date Taking? Authorizing Provider  amphetamine-dextroamphetamine (ADDERALL XR) 30 MG 24 hr capsule TAKE 1 CAPSULE BY MOUTH EVERY MORNING. 03/16/21 09/12/21   Pucilowski, Olgierd A, MD  amphetamine-dextroamphetamine (ADDERALL XR) 30 MG 24 hr capsule TAKE 1 CAPSULE (30 MG TOTAL) BY MOUTH EVERY MORNING. FILL 05/14/21 Patient not taking: Reported on 07/14/2021 03/16/21 09/12/21  Pucilowski, Olgierd A, MD  amphetamine-dextroamphetamine (ADDERALL XR) 30 MG 24 hr capsule TAKE 1 CAPSULE (30 MG TOTAL) BY MOUTH EVERY MORNING. FILL 04/14/21 Patient not taking: Reported on 07/14/2021 03/16/21 09/12/21  Pucilowski, Roosvelt Maserlgierd A, MD  amphetamine-dextroamphetamine (ADDERALL XR) 30 MG 24 hr capsule Take 1 capsule (30 mg total) by mouth every morning. 2/3 07/14/21 10/29/21  McLean-Scocuzza, Pasty Spillersracy N, MD  amphetamine-dextroamphetamine (ADDERALL XR) 30 MG 24 hr capsule Take 1 capsule (30 mg total) by mouth every morning. Rx 3/3 07/14/21 08/13/21  McLean-Scocuzza, Pasty Spillersracy N, MD  amphetamine-dextroamphetamine (ADDERALL XR) 30 MG 24 hr capsule Take 1 capsule (30 mg total) by mouth every morning. Further refills psychiatry 12/29/21 01/28/22  McLean-Scocuzza, Pasty Spillersracy N, MD  Benzoyl Peroxide 10 % LIQD Apply 1 Bottle topically daily as needed. Acne Be careful may bleach clothing 09/08/19   McLean-Scocuzza, Pasty Spillersracy N, MD  Cholecalciferol 1.25 MG (50000 UT) capsule Take 1 capsule (50,000 Units total) by mouth once a week. 06/20/20   McLean-Scocuzza, Pasty Spillersracy N, MD  clindamycin (CLEOCIN-T) 1 % external solution Apply topically 2 (two) times daily for acne on face and trunk 07/23/21   McLean-Scocuzza, Pasty Spillersracy N, MD  DULoxetine (CYMBALTA) 60 MG capsule Take  1 capsule (60 mg total) by mouth daily. Further refills Dr. Maryruth Bun 08/01/21 03/29/22  McLean-Scocuzza, Pasty Spillers, MD  zolpidem (AMBIEN CR) 12.5 MG CR tablet Take 1 tablet (12.5 mg total) by mouth at bedtime as needed for sleep. 07/14/21 01/28/22  McLean-Scocuzza, Pasty Spillers, MD      Allergies    Patient has no known allergies.    Review of Systems   Review of Systems  Constitutional:  Positive for appetite change. Negative for fever.  HENT:  Positive for sinus pain.    Eyes:  Positive for photophobia.  Respiratory:  Negative for shortness of breath.   Cardiovascular:  Negative for chest pain.  Gastrointestinal:  Positive for nausea and vomiting. Negative for abdominal pain.  Musculoskeletal:  Positive for gait problem.  Neurological:  Positive for dizziness, speech difficulty and headaches.   Physical Exam Updated Vital Signs BP (!) 136/92    Pulse 80    Temp 98.4 F (36.9 C) (Oral)    Resp 18    Ht 6\' 2"  (1.88 m)    Wt (!) 140.6 kg    SpO2 97%    BMI 39.80 kg/m  Physical Exam Vitals and nursing note reviewed.  Constitutional:      General: He is not in acute distress.    Appearance: Normal appearance. He is obese. He is ill-appearing. He is not toxic-appearing.  HENT:     Head: Normocephalic and atraumatic.     Nose: Congestion present.     Right Sinus: Maxillary sinus tenderness and frontal sinus tenderness present.     Left Sinus: Maxillary sinus tenderness and frontal sinus tenderness present.     Mouth/Throat:     Mouth: Mucous membranes are moist.     Pharynx: Oropharynx is clear.  Eyes:     General: No scleral icterus.    Extraocular Movements:     Right eye: Abnormal extraocular motion present.     Left eye: Abnormal extraocular motion present.     Pupils: Pupils are equal, round, and reactive to light.  Cardiovascular:     Rate and Rhythm: Normal rate and regular rhythm.     Pulses: Normal pulses.     Heart sounds: No murmur heard. Pulmonary:     Effort: Pulmonary effort is normal. No respiratory distress.     Breath sounds: Normal breath sounds.  Abdominal:     General: Bowel sounds are normal. There is no distension.     Palpations: Abdomen is soft.  Musculoskeletal:        General: Normal range of motion.     Cervical back: Normal range of motion.  Skin:    General: Skin is warm and dry.     Capillary Refill: Capillary refill takes less than 2 seconds.  Neurological:     Mental Status: He is alert.     Cranial Nerves:  No cranial nerve deficit or facial asymmetry.     Sensory: Sensory deficit present.     Motor: Motor function is intact. No pronator drift.     Coordination: Finger-Nose-Finger Test abnormal.     Comments: States that the left lower extremity feels more numb than the right.    ED Results / Procedures / Treatments   Labs (all labs ordered are listed, but only abnormal results are displayed) Labs Reviewed  BASIC METABOLIC PANEL - Abnormal; Notable for the following components:      Result Value   Sodium 133 (*)    Calcium 8.6 (*)  All other components within normal limits  CBC WITH DIFFERENTIAL/PLATELET - Abnormal; Notable for the following components:   RBC 4.11 (*)    Hemoglobin 12.4 (*)    HCT 37.1 (*)    All other components within normal limits  RESP PANEL BY RT-PCR (FLU A&B, COVID) ARPGX2  ETHANOL  CBG MONITORING, ED  \ EKG None  Radiology CT Angio Head W/Cm &/Or Wo Cm  Result Date: 12/30/2021 CLINICAL DATA:  Headache, sudden, severe; Dural venous sinus thrombosis suspected Headache, chronic, new features or increased frequency EXAM: CT ANGIOGRAPHY HEAD CT VENOGRAM HEAD TECHNIQUE: Multidetector CT imaging of the head was performed using the standard protocol during bolus administration of intravenous contrast. Multiplanar CT image reconstructions and MIPs were obtained to evaluate the vascular anatomy. Venographic phase images of the brain were obtained following the administration of intravenous contrast. Multiplanar reformats and maximum intensity projections were generated. CONTRAST:  80mL OMNIPAQUE IOHEXOL 350 MG/ML SOLN COMPARISON:  Same day CT head FINDINGS: CTA HEAD Anterior circulation: Bilateral intracranial ICAs, MCAs and ACAs are patent without proximal hemodynamically significant stenosis. No aneurysm identified. Posterior circulation: Bilateral intradural vertebral arteries, basilar artery, and posterior cerebral arteries are patent without proximal hemodynamically  significant stenosis. No aneurysm identified. Venous sinuses: See below. Review of the MIP images confirms the above findings. CT VENOGRAM No evidence of dural venous sinus thrombosis. The superior sagittal sinus transverse, sigmoid and straight sinuses appear patent. The visible deep cerebral veins appear patent. IMPRESSION: 1. No large vessel occlusion or proximal hemodynamically significant arterial stenosis. 2. No evidence of dural venous sinus thrombosis. Electronically Signed   By: Feliberto Harts M.D.   On: 12/30/2021 16:24   CT Head Wo Contrast  Result Date: 12/29/2021 CLINICAL DATA:  Status post trauma. EXAM: CT HEAD WITHOUT CONTRAST TECHNIQUE: Contiguous axial images were obtained from the base of the skull through the vertex without intravenous contrast. COMPARISON:  None. FINDINGS: Brain: No evidence of acute infarction, hemorrhage, hydrocephalus, extra-axial collection or mass lesion/mass effect. Vascular: No hyperdense vessel or unexpected calcification. Skull: Normal. Negative for fracture or focal lesion. Sinuses/Orbits: There is marked severity bilateral maxillary sinus, bilateral ethmoid sinus and sphenoid sinus mucosal thickening. Other: None. IMPRESSION: 1. No acute intracranial abnormality. 2. Marked severity bilateral maxillary sinus, bilateral ethmoid sinus and sphenoid sinus disease. Electronically Signed   By: Aram Candela M.D.   On: 12/29/2021 19:30   MR Brain Wo Contrast (neuro protocol)  Result Date: 12/30/2021 CLINICAL DATA:  Dizziness, persistent/recurrent, cardiac or vascular cause suspected EXAM: MRI HEAD WITHOUT CONTRAST TECHNIQUE: Multiplanar, multiecho pulse sequences of the brain and surrounding structures were obtained without intravenous contrast. COMPARISON:  CT head 12/29/2021. FINDINGS: Mildly motion limited study. Brain: No acute infarction, hemorrhage, hydrocephalus, extra-axial collection or mass lesion. Vascular: Major arterial flow voids are maintained at  the skull base. Skull and upper cervical spine: Normal marrow signal. Sinuses/Orbits: Near complete opacification bilateral maxillary sinuses, sphenoid sinuses. Partial fat opacification of scattered ethmoid air cells with pansinus mucosal thickening. Other: Moderate right and small left mastoid effusions. IMPRESSION: 1. No evidence of acute intracranial abnormality. 2. Severe paranasal sinus disease, suggestive of sinusitis. 3. Moderate right and small left mastoid effusions. Electronically Signed   By: Feliberto Harts M.D.   On: 12/30/2021 14:54   CT VENOGRAM HEAD  Result Date: 12/30/2021 CLINICAL DATA:  Headache, sudden, severe; Dural venous sinus thrombosis suspected Headache, chronic, new features or increased frequency EXAM: CT ANGIOGRAPHY HEAD CT VENOGRAM HEAD TECHNIQUE: Multidetector CT imaging of the  head was performed using the standard protocol during bolus administration of intravenous contrast. Multiplanar CT image reconstructions and MIPs were obtained to evaluate the vascular anatomy. Venographic phase images of the brain were obtained following the administration of intravenous contrast. Multiplanar reformats and maximum intensity projections were generated. CONTRAST:  79mL OMNIPAQUE IOHEXOL 350 MG/ML SOLN COMPARISON:  Same day CT head FINDINGS: CTA HEAD Anterior circulation: Bilateral intracranial ICAs, MCAs and ACAs are patent without proximal hemodynamically significant stenosis. No aneurysm identified. Posterior circulation: Bilateral intradural vertebral arteries, basilar artery, and posterior cerebral arteries are patent without proximal hemodynamically significant stenosis. No aneurysm identified. Venous sinuses: See below. Review of the MIP images confirms the above findings. CT VENOGRAM No evidence of dural venous sinus thrombosis. The superior sagittal sinus transverse, sigmoid and straight sinuses appear patent. The visible deep cerebral veins appear patent. IMPRESSION: 1. No large  vessel occlusion or proximal hemodynamically significant arterial stenosis. 2. No evidence of dural venous sinus thrombosis. Electronically Signed   By: Feliberto Harts M.D.   On: 12/30/2021 16:24    Procedures Procedures    Medications Ordered in ED Medications  Ampicillin-Sulbactam (UNASYN) 3 g in sodium chloride 0.9 % 100 mL IVPB (has no administration in time range)  ketorolac (TORADOL) 30 MG/ML injection 30 mg (has no administration in time range)  diazepam (VALIUM) injection 5 mg (5 mg Intravenous Given 12/30/21 1344)  iohexol (OMNIPAQUE) 350 MG/ML injection 80 mL (80 mLs Intravenous Contrast Given 12/30/21 1535)   ED Course/ Medical Decision Making/ A&P  Medical Decision Making 41 year old male who presents to the emergency department with dizziness.  Initial differential broad however initial concern for stroke versus posterior circulation stroke versus cerebral venous thrombosis given severity of symptoms as well as reported slurred speech and vomiting.  Additionally has had dizziness so severe that he has been unable to ambulate prior to arrival.  These findings are initially concerning for central causes of dizziness.  CT without, CTA of the head, MRI of the head as well as CT venogram all within normal limits.  Noted on multiple images shows severe paranasal sinus disease suggesting sinusitis.  He also has moderate right and left mastoid effusions.  Patient does have recent history of URI however complains of mild to moderate sinus pressure in the frontal and maxillary sinuses.  He does have tenderness to palpation of both mastoids.  Neither of them are erythematous or swollen.  Ears within normal limits.  Is presentations not consistent with acute CNS infection, vertebrobasilar artery insufficiency, cerebral hemorrhage, cerebellar hemorrhage or infarction, intracranial mass or bleed.  Patient given Valium with minimal relief of symptoms.  Given the severity of his symptoms feel  best to admit for IV antibiotics given concern for pansinusitis, possible mastoiditis on symptomology.  Feel that dizziness will improve with resolution of infection.  No leukocytosis, tachycardia or toxic presentation concerning for overt sepsis.  Lab review CBC with no leukocytosis COVID and flu negative Ethanol less than 10  I have independently reviewed the radiologic imaging and agree with radiologist interpretation. Discussed findings with patient and wife who feel best for admission versus going home with trial of p.o. antibiotics. I discussed this case with my attending physician, Dr. Durwin Nora, who cosigned this note including patient's presenting symptoms, physical exam, and planned diagnostics and interventions. Attending physician stated agreement with plan or made changes to plan which were implemented.  Dr. Durwin Nora assessed the patient at bedside and also recommends admission at this time for IV antibiotics.  Discussed  case with Dr. Janee Mornhompson, hospitalist who agrees to see the patient for admission.  Will admit for IV antibiotics and continued monitoring of dizziness and ability to ambulate.  Final Clinical Impression(s) / ED Diagnoses Final diagnoses:  Acute non-recurrent pansinusitis  Mastoiditis of both sides  Vertigo    Rx / DC Orders ED Discharge Orders     None         Cristopher Peruutry, Beulah Matusek E, PA-C 12/30/21 1716    Gloris Manchesterixon, Ryan, MD 01/03/22 [phone]

## 2021-12-30 NOTE — Plan of Care (Signed)
  Problem: Pain Managment: Goal: General experience of comfort will improve Outcome: Progressing   Problem: Nutrition: Goal: Adequate nutrition will be maintained Outcome: Progressing   Problem: Activity: Goal: Risk for activity intolerance will decrease Outcome: Progressing   

## 2021-12-31 DIAGNOSIS — Z20822 Contact with and (suspected) exposure to covid-19: Secondary | ICD-10-CM | POA: Diagnosis present

## 2021-12-31 DIAGNOSIS — E785 Hyperlipidemia, unspecified: Secondary | ICD-10-CM

## 2021-12-31 DIAGNOSIS — Z83438 Family history of other disorder of lipoprotein metabolism and other lipidemia: Secondary | ICD-10-CM | POA: Diagnosis not present

## 2021-12-31 DIAGNOSIS — A881 Epidemic vertigo: Secondary | ICD-10-CM | POA: Diagnosis not present

## 2021-12-31 DIAGNOSIS — R7303 Prediabetes: Secondary | ICD-10-CM | POA: Diagnosis present

## 2021-12-31 DIAGNOSIS — F339 Major depressive disorder, recurrent, unspecified: Secondary | ICD-10-CM | POA: Diagnosis not present

## 2021-12-31 DIAGNOSIS — J0140 Acute pansinusitis, unspecified: Secondary | ICD-10-CM | POA: Diagnosis not present

## 2021-12-31 DIAGNOSIS — R42 Dizziness and giddiness: Secondary | ICD-10-CM

## 2021-12-31 DIAGNOSIS — R519 Headache, unspecified: Secondary | ICD-10-CM | POA: Diagnosis not present

## 2021-12-31 DIAGNOSIS — Z811 Family history of alcohol abuse and dependence: Secondary | ICD-10-CM | POA: Diagnosis not present

## 2021-12-31 DIAGNOSIS — Z79899 Other long term (current) drug therapy: Secondary | ICD-10-CM | POA: Diagnosis not present

## 2021-12-31 DIAGNOSIS — Z833 Family history of diabetes mellitus: Secondary | ICD-10-CM | POA: Diagnosis not present

## 2021-12-31 DIAGNOSIS — Z6372 Alcoholism and drug addiction in family: Secondary | ICD-10-CM | POA: Diagnosis not present

## 2021-12-31 DIAGNOSIS — F900 Attention-deficit hyperactivity disorder, predominantly inattentive type: Secondary | ICD-10-CM | POA: Diagnosis not present

## 2021-12-31 DIAGNOSIS — Z6841 Body Mass Index (BMI) 40.0 and over, adult: Secondary | ICD-10-CM | POA: Diagnosis not present

## 2021-12-31 DIAGNOSIS — Z818 Family history of other mental and behavioral disorders: Secondary | ICD-10-CM | POA: Diagnosis not present

## 2021-12-31 DIAGNOSIS — J329 Chronic sinusitis, unspecified: Secondary | ICD-10-CM | POA: Diagnosis present

## 2021-12-31 DIAGNOSIS — Z8616 Personal history of COVID-19: Secondary | ICD-10-CM | POA: Diagnosis not present

## 2021-12-31 DIAGNOSIS — F909 Attention-deficit hyperactivity disorder, unspecified type: Secondary | ICD-10-CM | POA: Diagnosis present

## 2021-12-31 DIAGNOSIS — F411 Generalized anxiety disorder: Secondary | ICD-10-CM | POA: Diagnosis not present

## 2021-12-31 DIAGNOSIS — Z823 Family history of stroke: Secondary | ICD-10-CM | POA: Diagnosis not present

## 2021-12-31 DIAGNOSIS — H7093 Unspecified mastoiditis, bilateral: Secondary | ICD-10-CM | POA: Diagnosis not present

## 2021-12-31 DIAGNOSIS — Z8249 Family history of ischemic heart disease and other diseases of the circulatory system: Secondary | ICD-10-CM | POA: Diagnosis not present

## 2021-12-31 DIAGNOSIS — F3341 Major depressive disorder, recurrent, in partial remission: Secondary | ICD-10-CM | POA: Diagnosis present

## 2021-12-31 HISTORY — DX: Headache, unspecified: R51.9

## 2021-12-31 LAB — COMPREHENSIVE METABOLIC PANEL
ALT: 22 U/L (ref 0–44)
AST: 17 U/L (ref 15–41)
Albumin: 3.7 g/dL (ref 3.5–5.0)
Alkaline Phosphatase: 45 U/L (ref 38–126)
Anion gap: 12 (ref 5–15)
BUN: 12 mg/dL (ref 6–20)
CO2: 26 mmol/L (ref 22–32)
Calcium: 9 mg/dL (ref 8.9–10.3)
Chloride: 102 mmol/L (ref 98–111)
Creatinine, Ser: 0.7 mg/dL (ref 0.61–1.24)
GFR, Estimated: >60 mL/min (ref >60)
Glucose, Bld: 102 mg/dL — ABNORMAL HIGH (ref 70–99)
Potassium: 3.8 mmol/L (ref 3.5–5.1)
Sodium: 140 mmol/L (ref 135–145)
Total Bilirubin: 0.7 mg/dL (ref 0.3–1.2)
Total Protein: 7.6 g/dL (ref 6.5–8.1)

## 2021-12-31 LAB — CBC
HCT: 38.3 % — ABNORMAL LOW (ref 39.0–52.0)
Hemoglobin: 12.9 g/dL — ABNORMAL LOW (ref 13.0–17.0)
MCH: 30.1 pg (ref 26.0–34.0)
MCHC: 33.7 g/dL (ref 30.0–36.0)
MCV: 89.3 fL (ref 80.0–100.0)
Platelets: 272 10*3/uL (ref 150–400)
RBC: 4.29 MIL/uL (ref 4.22–5.81)
RDW: 12.5 % (ref 11.5–15.5)
WBC: 6.3 10*3/uL (ref 4.0–10.5)
nRBC: 0 % (ref 0.0–0.2)

## 2021-12-31 LAB — VITAMIN B12: Vitamin B-12: 357 pg/mL (ref 180–914)

## 2021-12-31 LAB — GLUCOSE, CAPILLARY: Glucose-Capillary: 103 mg/dL — ABNORMAL HIGH (ref 70–99)

## 2021-12-31 LAB — MAGNESIUM: Magnesium: 2.3 mg/dL (ref 1.7–2.4)

## 2021-12-31 LAB — PHOSPHORUS: Phosphorus: 4.1 mg/dL (ref 2.5–4.6)

## 2021-12-31 LAB — FOLATE: Folate: 16.3 ng/mL (ref >5.9)

## 2021-12-31 LAB — HIV ANTIBODY (ROUTINE TESTING W REFLEX): HIV Screen 4th Generation wRfx: NONREACTIVE

## 2021-12-31 MED ORDER — MECLIZINE HCL 25 MG PO TABS
25.0000 mg | ORAL_TABLET | Freq: Three times a day (TID) | ORAL | Status: DC | PRN
  Administered 2022-01-01: 25 mg via ORAL
  Filled 2021-12-31: qty 1

## 2021-12-31 MED ORDER — MECLIZINE HCL 25 MG PO TABS
25.0000 mg | ORAL_TABLET | Freq: Once | ORAL | Status: AC
  Administered 2021-12-31: 25 mg via ORAL
  Filled 2021-12-31: qty 1

## 2021-12-31 NOTE — Evaluation (Signed)
Physical Therapy Evaluation Patient Details Name: Cory Gomez MRN: [id number] DOB: 01-25-1981 Today's Date: 12/31/2021  History of Present Illness  41 year old PMH: ADHD, depression admitted with  dizziness, N/V,  slurred speech, gait imbalance, short-term memory loss, sinus headache, photophobia and phonophobia.  Patient noted 1 week prior to admission havign URI symptoms with fever.  MRI brain, CT angio head, CT venogram negative for any acute abnormalities but consistent with severe acute sinusitis with some mastoid effusions.  Clinical Impression  Pt admitted with above diagnosis.   Pt continues to have dizziness symptoms, he describes as room spinning. Nothing resolves, symptoms worsen with sitting and standing, has difficulty keeping eyes open for further observation. BP sitting 156/101, standing after 1 minute 152/109. Allowed pt to return to supine with HOB elevated.  Will attempt to have further vestibular f/u tomorrow if symptoms persist.   Pt currently with functional limitations due to the deficits listed below (see PT Problem List). Pt will benefit from skilled PT to increase their independence and safety with mobility to allow discharge to the venue listed below.          Recommendations for follow up therapy are one component of a multi-disciplinary discharge planning process, led by the attending physician.  Recommendations may be updated based on patient status, additional functional criteria and insurance authorization.  Follow Up Recommendations Other (comment) (TBD-OPPT ?)    Assistance Recommended at Discharge Intermittent Supervision/Assistance  Patient can return home with the following  A little help with walking and/or transfers;Assistance with cooking/housework;Help with stairs or ramp for entrance    Equipment Recommendations Other (comment) (TBD)  Recommendations for Other Services       Functional Status Assessment Patient has had a recent decline  in their functional status and demonstrates the ability to make significant improvements in function in a reasonable and predictable amount of time.     Precautions / Restrictions Precautions Precautions: Fall Restrictions Weight Bearing Restrictions: No      Mobility  Bed Mobility Overal bed mobility: Modified Independent             General bed mobility comments: HOB elevated    Transfers Overall transfer level: Needs assistance Equipment used: Rolling walker (2 wheels) Transfers: Sit to/from Stand Sit to Stand: Min guard           General transfer comment: repeated x2. dizziness worsened with standing.  attempted gaze stabilization, pt having difficulty keeping eyes open.    Ambulation/Gait               General Gait Details: did not attempt d/t incr dizziness with standing  Stairs            Wheelchair Mobility    Modified Rankin (Stroke Patients Only)       Balance Overall balance assessment: Needs assistance   Sitting balance-Leahy Scale: Good       Standing balance-Leahy Scale: Poor Standing balance comment: reliant on at least unilateral UE support d/t dizziness                             Pertinent Vitals/Pain Pain Assessment: No/denies pain    Home Living Family/patient expects to be discharged to:: Private residence Living Arrangements: Spouse/significant other Available Help at Discharge: Family   Home Access: Stairs to enter   Entrance Stairs-Number of Steps: 1   Home Layout: One level Home Equipment: None      Prior Function  Prior Level of Function : Independent/Modified Independent;Driving;Working/employed             Mobility Comments: works in Consulting civil engineer for [employer]        Extremity/Trunk Assessment   Upper Extremity Assessment Upper Extremity Assessment: Overall WFL for tasks assessed    Lower Extremity Assessment Lower Extremity Assessment: Overall WFL for tasks  assessed       Communication   Communication: No difficulties  Cognition Arousal/Alertness: Awake/alert Behavior During Therapy: WFL for tasks assessed/performed Overall Cognitive Status: Within Functional Limits for tasks assessed                                          General Comments      Exercises     Assessment/Plan    PT Assessment Patient needs continued PT services  PT Problem List Decreased mobility;Decreased balance;Decreased activity tolerance       PT Treatment Interventions Gait training;Balance training;Other (comment);Therapeutic activities;DME instruction    PT Goals (Current goals can be found in the Care Plan section)  Acute Rehab PT Goals Patient Stated Goal: feel better, not have dizziness PT Goal Formulation: With patient Time For Goal Achievement: 01/07/22 Potential to Achieve Goals: Good    Frequency Min 3X/week     Co-evaluation               AM-PAC PT "6 Clicks" Mobility  Outcome Measure Help needed turning from your back to your side while in a flat bed without using bedrails?: None Help needed moving from lying on your back to sitting on the side of a flat bed without using bedrails?: None Help needed moving to and from a bed to a chair (including a wheelchair)?: A Little Help needed standing up from a chair using your arms (e.g., wheelchair or bedside chair)?: A Little Help needed to walk in hospital room?: A Lot Help needed climbing 3-5 steps with a railing? : A Lot 6 Click Score: 18    End of Session Equipment Utilized During Treatment: Gait belt Activity Tolerance: Other (comment) (limited by dizziness)   Nurse Communication: Mobility status PT Visit Diagnosis: Dizziness and giddiness (R42)    Time: 1210-1230 PT Time Calculation (min) (ACUTE ONLY): 20 min   Charges:   PT Evaluation $PT Eval Low Complexity: 1 Low          Marilynne Dupuis, PT  Acute Rehab Dept ([facility]) [phone] Pager  [phone]  12/31/2021   [facility] 12/31/2021, 1:03 PM

## 2021-12-31 NOTE — Progress Notes (Signed)
PROGRESS NOTE    Cory Gomez  AOZ:[id number] DOB: [birth year] DOA: 12/29/2021 PCP: McLean-Scocuzza, Pasty Spillers, MD    Chief Complaint  Patient presents with   Dizziness   Emesis    Brief Narrative:  Patient 41 year old gentleman history of ADHD, depression presenting with 1 to 2-day history of dizziness, nausea vomiting slurred speech, gait imbalance, short-term memory loss, sinus headache, photophobia and phonophobia.  Patient noted 1 week prior to admission to have upper respiratory symptoms as well as a temp as high as 100.3.  Patient noted on day of presentation to have significant dizziness nausea vomiting gait instability on presentation.  MRI brain, CT angio head, CT venogram negative for any acute abnormalities but consistent with severe acute sinusitis with some mastoid effusions.   Assessment & Plan:   Principal Problem:   Sinusitis Active Problems:   Obesity (BMI 30-39.9)   Major depressive disorder, recurrent episode, in partial remission (HCC)   ADD (attention deficit disorder)   GAD (generalized anxiety disorder)   Depression, recurrent (HCC)   Hyperlipidemia   Morbid obesity with BMI of 40.0-44.9, adult (HCC)   Headache   Vertigo   #1 severe paranasal/maxillary sinusitis/sphenoid sinusitis -Patient presented with nausea vomiting dizziness sinus headaches, painful maxillary and frontal sinuses with associated gait imbalance. -Patient with recent upper respiratory infection approximately 1 week prior to admission. -MRI brain done due to initial concern for acute CVA showed severe paranasal sinus disease suggestive of sinusitis, moderate right and small left mastoid effusions. -Patient with no recent ear infection, ear exam unremarkable. -Continue IV Unasyn, Claritin, Flonase, IV fluids, IV antiemetics. -Supportive care.  2.  Bilateral Mastoid effusion -MRI brain done with moderate right and small left mastoid effusions. -Patient with no recent ear  infection. -Slight tenderness to palpation in the mastoid region. -Ear exam unremarkable. -Continue empiric treatment for sinusitis with IV antibiotics. -If no significant clinical improvement will need to follow-up with ENT in the outpatient setting.  3.  Nausea/ vomiting /dizziness/gait imbalance versus vertigo -Likely secondary to problem #1 in addition to possible vertigo. -MRI brain negative for any acute intracranial abnormalities and consistent with severe paranasal sinus disease. -CT angiogram head/CT venogram with no large vessel occlusion or proximal hemodynamically significant arterial stenosis.  No evidence of dural venous thrombosis. -Continue treatment as in problem #1. -States patient still with complaints of dizziness described as a spinning sensation needing help from the staff for ambulation to the restroom. -PT vestibular evaluation. -We will give a dose of meclizine 25 mg p.o. x1, if clinical improvement will place on meclizine as needed. -IV antiemetics, IV fluids, supportive care.  4.  Depression/general anxiety disorder -Continue home regimen Cymbalta.  5.  ADD -Adderall.   6.  Morbid obesity -Weight loss. -Outpatient follow-up with PCP.  7.  Headache -Likely secondary to problem #1 versus migrainous. -Patient with complaints of photophobia. -IV Compazine as needed, IV Toradol as needed. -Continue treatment as in problem #1.  8.  Elevated blood pressure -Likely secondary to problem #1 and pain. -Monitor. -IV hydralazine as needed.     DVT prophylaxis: Lovenox Code Status: Full Family Communication: Updated patient.  No family at bedside. Disposition:   Status is: Observation  The patient remains OBS appropriate and will d/c before 2 midnights.      Consultants:  None  Procedures:  MRI brain 12/30/2021 CT angiogram head 12/30/2021 CT venogram 12/30/2021    Antimicrobials:  IV Unasyn 12/30/2021>>>>>   Subjective: Patient is sitting up in  bed.  Still with complaints of dizziness which he describes as a spinning sensation stating he needs help from the staff to ambulate and go to the bathroom.  Had an episode of nausea.  Complaining of a headache with associated photophobia.  No chest pain.  No shortness of breath.  No abdominal pain.  Tolerating clears.  Objective: Vitals:   12/30/21 2206 12/31/21 0134 12/31/21 0557 12/31/21 0600  BP: (!) 142/94 (!) 143/93 (!) 150/87   Pulse: 83 78 79   Resp: 16 14 18    Temp: 97.8 F (36.6 C) 98 F (36.7 C) 98 F (36.7 C)   TempSrc:   Oral   SpO2: 98% 98% 97%   Weight:    132.9 kg  Height:        Intake/Output Summary (Last 24 hours) at 12/31/2021 1037 Last data filed at 12/31/2021 0900 Gross per 24 hour  Intake 1527.48 ml  Output --  Net 1527.48 ml   Filed Weights   12/29/21 1834 12/31/21 0600  Weight: (!) 140.6 kg 132.9 kg    Examination:  General exam: Appears calm and comfortable.  Maxillary and frontal sinus tenderness to palpation. Respiratory system: Clear to auscultation. Respiratory effort normal. Cardiovascular system: S1 & S2 heard, RRR. No JVD, murmurs, rubs, gallops or clicks. No pedal edema. Gastrointestinal system: Abdomen is nondistended, soft and nontender. No organomegaly or masses felt. Normal bowel sounds heard. Central nervous system: Alert and oriented. No focal neurological deficits. Extremities: Symmetric 5 x 5 power. Skin: No rashes, lesions or ulcers Psychiatry: Judgement and insight appear normal. Mood & affect appropriate.     Data Reviewed: I have personally reviewed following labs and imaging studies  CBC: Recent Labs  Lab 12/30/21 1338 12/31/21 0453  WBC 7.0 6.3  NEUTROABS 4.5  --   HGB 12.4* 12.9*  HCT 37.1* 38.3*  MCV 90.3 89.3  PLT 291 272    Basic Metabolic Panel: Recent Labs  Lab 12/30/21 1338 12/31/21 0453  NA 133* 140  K 3.7 3.8  CL 103 102  CO2 25 26  GLUCOSE 86 102*  BUN 14 12  CREATININE 0.77 0.70  CALCIUM 8.6*  9.0  MG  --  2.3  PHOS  --  4.1    GFR: Estimated Creatinine Clearance: 181.5 mL/min (by C-G formula based on SCr of 0.7 mg/dL).  Liver Function Tests: Recent Labs  Lab 12/31/21 0453  AST 17  ALT 22  ALKPHOS 45  BILITOT 0.7  PROT 7.6  ALBUMIN 3.7    CBG: Recent Labs  Lab 12/30/21 1324 12/31/21 0554  GLUCAP 80 103*     Recent Results (from the past 240 hour(s))  Resp Panel by RT-PCR (Flu A&B, Covid) Nasopharyngeal Swab     Status: None   Collection Time: 12/25/21  6:20 PM   Specimen: Nasopharyngeal Swab; Nasopharyngeal(NP) swabs in vial transport medium  Result Value Ref Range Status   SARS Coronavirus 2 by RT PCR NEGATIVE NEGATIVE Final    Comment: (NOTE) SARS-CoV-2 target nucleic acids are NOT DETECTED.  The SARS-CoV-2 RNA is generally detectable in upper respiratory specimens during the acute phase of infection. The lowest concentration of SARS-CoV-2 viral copies this assay can detect is 138 copies/mL. A negative result does not preclude SARS-Cov-2 infection and should not be used as the sole basis for treatment or other patient management decisions. A negative result may occur with  improper specimen collection/handling, submission of specimen other than nasopharyngeal swab, presence of viral mutation(s) within the areas  targeted by this assay, and inadequate number of viral copies(<138 copies/mL). A negative result must be combined with clinical observations, patient history, and epidemiological information. The expected result is Negative.  Fact Sheet for Patients:  BloggerCourse.com  Fact Sheet for Healthcare Providers:  SeriousBroker.it  This test is no t yet approved or cleared by the [place] FDA and  has been authorized for detection and/or diagnosis of SARS-CoV-2 by FDA under an Emergency Use Authorization (EUA). This EUA will remain  in effect (meaning this test can be used) for the duration  of the COVID-19 declaration under Section 564(b)(1) of the Act, 21 U.S.C.section 360bbb-3(b)(1), unless the authorization is terminated  or revoked sooner.       Influenza A by PCR NEGATIVE NEGATIVE Final   Influenza B by PCR NEGATIVE NEGATIVE Final    Comment: (NOTE) The Xpert Xpress SARS-CoV-2/FLU/RSV plus assay is intended as an aid in the diagnosis of influenza from Nasopharyngeal swab specimens and should not be used as a sole basis for treatment. Nasal washings and aspirates are unacceptable for Xpert Xpress SARS-CoV-2/FLU/RSV testing.  Fact Sheet for Patients: BloggerCourse.com  Fact Sheet for Healthcare Providers: SeriousBroker.it  This test is not yet approved or cleared by the [place] FDA and has been authorized for detection and/or diagnosis of SARS-CoV-2 by FDA under an Emergency Use Authorization (EUA). This EUA will remain in effect (meaning this test can be used) for the duration of the COVID-19 declaration under Section 564(b)(1) of the Act, 21 U.S.C. section 360bbb-3(b)(1), unless the authorization is terminated or revoked.  Performed at [facility], [address] [place], [place] [address]   Resp Panel by RT-PCR (Flu A&B, Covid) Nasopharyngeal Swab     Status: None   Collection Time: 12/29/21  7:15 PM   Specimen: Nasopharyngeal Swab; Nasopharyngeal(NP) swabs in vial transport medium  Result Value Ref Range Status   SARS Coronavirus 2 by RT PCR NEGATIVE NEGATIVE Final    Comment: (NOTE) SARS-CoV-2 target nucleic acids are NOT DETECTED.  The SARS-CoV-2 RNA is generally detectable in upper respiratory specimens during the acute phase of infection. The lowest concentration of SARS-CoV-2 viral copies this assay can detect is 138 copies/mL. A negative result does not preclude SARS-Cov-2 infection and should not be used as the sole basis for treatment or other patient management decisions. A  negative result may occur with  improper specimen collection/handling, submission of specimen other than nasopharyngeal swab, presence of viral mutation(s) within the areas targeted by this assay, and inadequate number of viral copies(<138 copies/mL). A negative result must be combined with clinical observations, patient history, and epidemiological information. The expected result is Negative.  Fact Sheet for Patients:  BloggerCourse.com  Fact Sheet for Healthcare Providers:  SeriousBroker.it  This test is no t yet approved or cleared by the [place] FDA and  has been authorized for detection and/or diagnosis of SARS-CoV-2 by FDA under an Emergency Use Authorization (EUA). This EUA will remain  in effect (meaning this test can be used) for the duration of the COVID-19 declaration under Section 564(b)(1) of the Act, 21 U.S.C.section 360bbb-3(b)(1), unless the authorization is terminated  or revoked sooner.       Influenza A by PCR NEGATIVE NEGATIVE Final   Influenza B by PCR NEGATIVE NEGATIVE Final    Comment: (NOTE) The Xpert Xpress SARS-CoV-2/FLU/RSV plus assay is intended as an aid in the diagnosis of influenza from Nasopharyngeal swab specimens and should not be used as a sole  basis for treatment. Nasal washings and aspirates are unacceptable for Xpert Xpress SARS-CoV-2/FLU/RSV testing.  Fact Sheet for Patients: BloggerCourse.com  Fact Sheet for Healthcare Providers: SeriousBroker.it  This test is not yet approved or cleared by the [place] FDA and has been authorized for detection and/or diagnosis of SARS-CoV-2 by FDA under an Emergency Use Authorization (EUA). This EUA will remain in effect (meaning this test can be used) for the duration of the COVID-19 declaration under Section 564(b)(1) of the Act, 21 U.S.C. section 360bbb-3(b)(1), unless the authorization  is terminated or revoked.  Performed at [facility], [address], [place], [place] [address]          Radiology Studies: CT Angio Head W/Cm &/Or Wo Cm  Result Date: 12/30/2021 CLINICAL DATA:  Headache, sudden, severe; Dural venous sinus thrombosis suspected Headache, chronic, new features or increased frequency EXAM: CT ANGIOGRAPHY HEAD CT VENOGRAM HEAD TECHNIQUE: Multidetector CT imaging of the head was performed using the standard protocol during bolus administration of intravenous contrast. Multiplanar CT image reconstructions and MIPs were obtained to evaluate the vascular anatomy. Venographic phase images of the brain were obtained following the administration of intravenous contrast. Multiplanar reformats and maximum intensity projections were generated. CONTRAST:  80mL OMNIPAQUE IOHEXOL 350 MG/ML SOLN COMPARISON:  Same day CT head FINDINGS: CTA HEAD Anterior circulation: Bilateral intracranial ICAs, MCAs and ACAs are patent without proximal hemodynamically significant stenosis. No aneurysm identified. Posterior circulation: Bilateral intradural vertebral arteries, basilar artery, and posterior cerebral arteries are patent without proximal hemodynamically significant stenosis. No aneurysm identified. Venous sinuses: See below. Review of the MIP images confirms the above findings. CT VENOGRAM No evidence of dural venous sinus thrombosis. The superior sagittal sinus transverse, sigmoid and straight sinuses appear patent. The visible deep cerebral veins appear patent. IMPRESSION: 1. No large vessel occlusion or proximal hemodynamically significant arterial stenosis. 2. No evidence of dural venous sinus thrombosis. Electronically Signed   By: Feliberto Harts M.D.   On: 12/30/2021 16:24   CT Head Wo Contrast  Result Date: 12/29/2021 CLINICAL DATA:  Status post trauma. EXAM: CT HEAD WITHOUT CONTRAST TECHNIQUE: Contiguous axial images were obtained from the base of the skull  through the vertex without intravenous contrast. COMPARISON:  None. FINDINGS: Brain: No evidence of acute infarction, hemorrhage, hydrocephalus, extra-axial collection or mass lesion/mass effect. Vascular: No hyperdense vessel or unexpected calcification. Skull: Normal. Negative for fracture or focal lesion. Sinuses/Orbits: There is marked severity bilateral maxillary sinus, bilateral ethmoid sinus and sphenoid sinus mucosal thickening. Other: None. IMPRESSION: 1. No acute intracranial abnormality. 2. Marked severity bilateral maxillary sinus, bilateral ethmoid sinus and sphenoid sinus disease. Electronically Signed   By: Aram Candela M.D.   On: 12/29/2021 19:30   MR Brain Wo Contrast (neuro protocol)  Result Date: 12/30/2021 CLINICAL DATA:  Dizziness, persistent/recurrent, cardiac or vascular cause suspected EXAM: MRI HEAD WITHOUT CONTRAST TECHNIQUE: Multiplanar, multiecho pulse sequences of the brain and surrounding structures were obtained without intravenous contrast. COMPARISON:  CT head 12/29/2021. FINDINGS: Mildly motion limited study. Brain: No acute infarction, hemorrhage, hydrocephalus, extra-axial collection or mass lesion. Vascular: Major arterial flow voids are maintained at the skull base. Skull and upper cervical spine: Normal marrow signal. Sinuses/Orbits: Near complete opacification bilateral maxillary sinuses, sphenoid sinuses. Partial fat opacification of scattered ethmoid air cells with pansinus mucosal thickening. Other: Moderate right and small left mastoid effusions. IMPRESSION: 1. No evidence of acute intracranial abnormality. 2. Severe paranasal sinus disease, suggestive of sinusitis. 3. Moderate right and small left  mastoid effusions. Electronically Signed   By: Feliberto Harts M.D.   On: 12/30/2021 14:54   CT VENOGRAM HEAD  Result Date: 12/30/2021 CLINICAL DATA:  Headache, sudden, severe; Dural venous sinus thrombosis suspected Headache, chronic, new features or increased  frequency EXAM: CT ANGIOGRAPHY HEAD CT VENOGRAM HEAD TECHNIQUE: Multidetector CT imaging of the head was performed using the standard protocol during bolus administration of intravenous contrast. Multiplanar CT image reconstructions and MIPs were obtained to evaluate the vascular anatomy. Venographic phase images of the brain were obtained following the administration of intravenous contrast. Multiplanar reformats and maximum intensity projections were generated. CONTRAST:  80mL OMNIPAQUE IOHEXOL 350 MG/ML SOLN COMPARISON:  Same day CT head FINDINGS: CTA HEAD Anterior circulation: Bilateral intracranial ICAs, MCAs and ACAs are patent without proximal hemodynamically significant stenosis. No aneurysm identified. Posterior circulation: Bilateral intradural vertebral arteries, basilar artery, and posterior cerebral arteries are patent without proximal hemodynamically significant stenosis. No aneurysm identified. Venous sinuses: See below. Review of the MIP images confirms the above findings. CT VENOGRAM No evidence of dural venous sinus thrombosis. The superior sagittal sinus transverse, sigmoid and straight sinuses appear patent. The visible deep cerebral veins appear patent. IMPRESSION: 1. No large vessel occlusion or proximal hemodynamically significant arterial stenosis. 2. No evidence of dural venous sinus thrombosis. Electronically Signed   By: Feliberto Harts M.D.   On: 12/30/2021 16:24        Scheduled Meds:  amphetamine-dextroamphetamine  30 mg Oral q morning   DULoxetine  60 mg Oral Daily   enoxaparin (LOVENOX) injection  70 mg Subcutaneous Q24H   fluticasone  2 spray Each Nare Daily   ketorolac  30 mg Intravenous Once   loratadine  10 mg Oral Daily   meclizine  25 mg Oral Once   pantoprazole  40 mg Oral Daily   sodium chloride flush  3 mL Intravenous Q12H   Continuous Infusions:  sodium chloride 125 mL/hr at 12/31/21 0221   ampicillin-sulbactam (UNASYN) IV 3 g (12/31/21 0903)     LOS:  0 days    Time spent: 35 minutes    Ramiro Harvest, MD [facility]   To contact the attending provider between 7A-7P or the covering provider during after hours 7P-7A, please log into the web site [email or web address] and access using universal  password for that web site. If you do not have the password, please call the hospital operator.  12/31/2021, 10:37 AM

## 2022-01-01 ENCOUNTER — Other Ambulatory Visit (HOSPITAL_COMMUNITY): Payer: Self-pay

## 2022-01-01 ENCOUNTER — Other Ambulatory Visit: Payer: Self-pay

## 2022-01-01 DIAGNOSIS — H7093 Unspecified mastoiditis, bilateral: Secondary | ICD-10-CM

## 2022-01-01 LAB — CBC WITH DIFFERENTIAL/PLATELET
Abs Immature Granulocytes: 0.03 10*3/uL (ref 0.00–0.07)
Basophils Absolute: 0 10*3/uL (ref 0.0–0.1)
Basophils Relative: 1 %
Eosinophils Absolute: 0.1 10*3/uL (ref 0.0–0.5)
Eosinophils Relative: 2 %
HCT: 37.2 % — ABNORMAL LOW (ref 39.0–52.0)
Hemoglobin: 12.7 g/dL — ABNORMAL LOW (ref 13.0–17.0)
Immature Granulocytes: 1 %
Lymphocytes Relative: 32 %
Lymphs Abs: 1.7 10*3/uL (ref 0.7–4.0)
MCH: 30 pg (ref 26.0–34.0)
MCHC: 34.1 g/dL (ref 30.0–36.0)
MCV: 87.7 fL (ref 80.0–100.0)
Monocytes Absolute: 0.4 10*3/uL (ref 0.1–1.0)
Monocytes Relative: 8 %
Neutro Abs: 3.2 10*3/uL (ref 1.7–7.7)
Neutrophils Relative %: 56 %
Platelets: 288 10*3/uL (ref 150–400)
RBC: 4.24 MIL/uL (ref 4.22–5.81)
RDW: 12.2 % (ref 11.5–15.5)
WBC: 5.5 10*3/uL (ref 4.0–10.5)
nRBC: 0 % (ref 0.0–0.2)

## 2022-01-01 LAB — BASIC METABOLIC PANEL
Anion gap: 8 (ref 5–15)
BUN: 9 mg/dL (ref 6–20)
CO2: 25 mmol/L (ref 22–32)
Calcium: 8.7 mg/dL — ABNORMAL LOW (ref 8.9–10.3)
Chloride: 105 mmol/L (ref 98–111)
Creatinine, Ser: 0.71 mg/dL (ref 0.61–1.24)
GFR, Estimated: >60 mL/min (ref >60)
Glucose, Bld: 97 mg/dL (ref 70–99)
Potassium: 3.6 mmol/L (ref 3.5–5.1)
Sodium: 138 mmol/L (ref 135–145)

## 2022-01-01 LAB — MAGNESIUM: Magnesium: 2.3 mg/dL (ref 1.7–2.4)

## 2022-01-01 LAB — GLUCOSE, CAPILLARY: Glucose-Capillary: 101 mg/dL — ABNORMAL HIGH (ref 70–99)

## 2022-01-01 MED ORDER — FLUTICASONE PROPIONATE 50 MCG/ACT NA SUSP
2.0000 | Freq: Every day | NASAL | 1 refills | Status: DC
  Filled 2022-01-01: qty 11.1, fill #0
  Filled 2022-01-01: qty 16, 30d supply, fill #0

## 2022-01-01 MED ORDER — PROCHLORPERAZINE MALEATE 10 MG PO TABS
10.0000 mg | ORAL_TABLET | Freq: Four times a day (QID) | ORAL | 0 refills | Status: DC | PRN
  Filled 2022-01-01 (×2): qty 20, 5d supply, fill #0

## 2022-01-01 MED ORDER — TRAMADOL HCL 50 MG PO TABS
50.0000 mg | ORAL_TABLET | Freq: Four times a day (QID) | ORAL | 0 refills | Status: DC | PRN
  Filled 2022-01-01: qty 15, 4d supply, fill #0

## 2022-01-01 MED ORDER — LORATADINE 10 MG PO TABS
10.0000 mg | ORAL_TABLET | Freq: Every day | ORAL | 1 refills | Status: DC
  Filled 2022-01-01 (×2): qty 30, 30d supply, fill #0

## 2022-01-01 MED ORDER — POTASSIUM CHLORIDE CRYS ER 20 MEQ PO TBCR
40.0000 meq | EXTENDED_RELEASE_TABLET | Freq: Once | ORAL | Status: AC
  Administered 2022-01-01: 40 meq via ORAL
  Filled 2022-01-01: qty 2

## 2022-01-01 MED ORDER — PANTOPRAZOLE SODIUM 40 MG PO TBEC
40.0000 mg | DELAYED_RELEASE_TABLET | Freq: Every day | ORAL | 1 refills | Status: DC
  Filled 2022-01-01 (×2): qty 30, 30d supply, fill #0

## 2022-01-01 MED ORDER — AMLODIPINE BESYLATE 5 MG PO TABS
5.0000 mg | ORAL_TABLET | Freq: Every day | ORAL | 1 refills | Status: DC
  Filled 2022-01-01 (×2): qty 30, 30d supply, fill #0

## 2022-01-01 MED ORDER — AMOXICILLIN-POT CLAVULANATE 875-125 MG PO TABS
1.0000 | ORAL_TABLET | Freq: Two times a day (BID) | ORAL | 0 refills | Status: AC
  Filled 2022-01-01 (×2): qty 10, 5d supply, fill #0

## 2022-01-01 MED ORDER — MECLIZINE HCL 25 MG PO TABS
25.0000 mg | ORAL_TABLET | Freq: Three times a day (TID) | ORAL | 0 refills | Status: DC | PRN
  Filled 2022-01-01 (×2): qty 20, 7d supply, fill #0

## 2022-01-01 MED ORDER — AMLODIPINE BESYLATE 5 MG PO TABS
5.0000 mg | ORAL_TABLET | Freq: Every day | ORAL | Status: DC
  Administered 2022-01-01: 5 mg via ORAL
  Filled 2022-01-01: qty 1

## 2022-01-01 NOTE — Progress Notes (Signed)
Physical Therapy Treatment Patient Details Name: Cory Gomez MRN: [id number] DOB: 08/20/1981 Today's Date: 01/01/2022   History of Present Illness 41 year old PMH: ADHD, depression admitted with  dizziness, N/V,  slurred speech, gait imbalance, short-term memory loss, sinus headache, photophobia and phonophobia.  Patient noted 1 week prior to admission having URI symptoms with fever.  MRI brain, CT angio head, CT venogram negative for any acute abnormalities but consistent with severe acute sinusitis with some mastoid effusions.    PT Comments    Pt reports no symptoms this morning since taking meds earlier and had no symptoms with bed mobility last night.  Pt reports headache and "pressure" with some oculomotor testing however no nystagmus observed.  Pt assisted with ambulating using RW for stability and only had dizziness in 10 second episode, otherwise no symptoms during mobility.  Pt feels he is improving and encouraged with his progress with ambulation today.    Recommendations for follow up therapy are one component of a multi-disciplinary discharge planning process, led by the attending physician.  Recommendations may be updated based on patient status, additional functional criteria and insurance authorization.  Follow Up Recommendations  Other (comment) (OPPT only if symptoms persist however appeared improved today)     Assistance Recommended at Discharge Intermittent Supervision/Assistance  Patient can return home with the following A little help with walking and/or transfers;Assistance with cooking/housework;Help with stairs or ramp for entrance   Equipment Recommendations  Rolling walker (2 wheels)    Recommendations for Other Services       Precautions / Restrictions Precautions Precautions: Fall     Mobility  Bed Mobility Overal bed mobility: Modified Independent                  Transfers Overall transfer level: Needs assistance Equipment used:  Rolling walker (2 wheels) Transfers: Sit to/from Stand Sit to Stand: Min guard           General transfer comment: verbal cues for hand placement    Ambulation/Gait Ambulation/Gait assistance: Min guard Gait Distance (Feet): 40 Feet Assistive device: Rolling walker (2 wheels) Gait Pattern/deviations: Step-through pattern;Decreased stride length       General Gait Details: very slow cautious gait but steady with RW, initially wide BOS however improved with distance; only once pt became unsteady due to dizziness which last less than 10 seconds and pt was able to self correct with use of RW for support; cues for focusing on stationary object in distance; pt otherwise denied symptoms during ambulation   Stairs             Wheelchair Mobility    Modified Rankin (Stroke Patients Only)       Balance                                            Cognition Arousal/Alertness: Awake/alert Behavior During Therapy: WFL for tasks assessed/performed Overall Cognitive Status: Within Functional Limits for tasks assessed                                          Exercises      General Comments        Pertinent Vitals/Pain Pain Assessment: No/denies pain    Home Living  Prior Function            PT Goals (current goals can now be found in the care plan section) Progress towards PT goals: Progressing toward goals    Frequency    Min 3X/week      PT Plan Current plan remains appropriate    Co-evaluation              AM-PAC PT "6 Clicks" Mobility   Outcome Measure  Help needed turning from your back to your side while in a flat bed without using bedrails?: None Help needed moving from lying on your back to sitting on the side of a flat bed without using bedrails?: None Help needed moving to and from a bed to a chair (including a wheelchair)?: A Little Help needed standing up from a  chair using your arms (e.g., wheelchair or bedside chair)?: A Little Help needed to walk in hospital room?: A Little Help needed climbing 3-5 steps with a railing? : A Lot 6 Click Score: 19    End of Session Equipment Utilized During Treatment: Gait belt Activity Tolerance: Patient tolerated treatment well Patient left: in chair;with call bell/phone within reach;with family/visitor present Nurse Communication: Mobility status PT Visit Diagnosis: Difficulty in walking, not elsewhere classified (R26.2)     Time: 6578-4696 PT Time Calculation (min) (ACUTE ONLY): 18 min  Charges:  $Gait Training: 8-22 mins                    Cory Gomez, DPT Acute Rehabilitation Services Pager: [phone] Office: [phone]    Cory Gomez 01/01/2022, 1:20 PM
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
## 2022-01-01 NOTE — Telephone Encounter (Signed)
Will you be willing to see again Dr. Miles Costain inform I will not be managing pts mental health

## 2022-01-01 NOTE — Telephone Encounter (Signed)
Cory Gomez, Cory Gomez  You; McLean-Scocuzza, Pasty Spillers, MD; Darliss Ridgel, MD 57 minutes ago (2:58 PM)   RY Per office, Pt did not return np paperwork and did not respond to phone calls. Pt had appt on 11/07/2022 no show appt.     Noted

## 2022-01-01 NOTE — Progress Notes (Signed)
Discharge instructions discussed with patient and family, verbalized agreement and understanding 

## 2022-01-01 NOTE — TOC Transition Note (Signed)
Transition of Care [facility]) - CM/SW Discharge Note   Patient Details  Name: Cory Gomez MRN: [id number] Date of Birth: 03/12/1981  [facility]) CM/SW Contact:  Lennart Pall, LCSW Phone Number: 01/01/2022, 3:28 PM   Clinical Narrative:    Alerted by RN that pt set for dc today and in need for rolling walker and OPPT.  Pt without agency pref on DME - order placed with [employer].  Pt requests [facility] for OP - referral sent.  No further TOC needs.   Final next level of care: OP Rehab Barriers to Discharge: No Barriers Identified   Patient Goals and CMS Choice Patient states their goals for this hospitalization and ongoing recovery are:: return home      Discharge Placement                       Discharge Plan and Services                DME Arranged: Walker rolling DME Agency: [employer] Date DME Agency Contacted: 01/01/22 Time DME Agency Contacted: 1528 Representative spoke with at DME Agency: [place]) Interventions     Readmission Risk Interventions No flowsheet data found.

## 2022-01-01 NOTE — Progress Notes (Signed)
Transition of Care [facility]) Screening Note   Patient Details  Name: Rigel Koyanagi Date of Birth: 25-Dec-1980   Transition of Care [facility]) CM/SW Contact:    Amada Jupiter, LCSW Phone Number: 01/01/2022, 2:07 PM    Transition of Care Department [facility]) has reviewed patient and no TOC needs have been identified at this time. We will continue to monitor patient advancement through interdisciplinary progression rounds. If new patient transition needs arise, please place a TOC consult.

## 2022-01-02 ENCOUNTER — Other Ambulatory Visit: Payer: Self-pay

## 2022-01-02 NOTE — Telephone Encounter (Signed)
Letter sent.

## 2022-01-02 NOTE — Patient Outreach (Signed)
Received a hospital 01/01/22 discharge notification for [name].  I have sent the [facility] Embedded Care Guides a message through Epic to assign a RN to call for follow up and determine if there are any Case Management needs.   Iverson Alamin, Donivan Scull [facility] Care Management Assistant [facility] Care Management [phone]

## 2022-01-03 ENCOUNTER — Telehealth: Payer: Self-pay

## 2022-01-03 NOTE — Telephone Encounter (Signed)
Transition Care Management Follow-up Telephone Call Date of discharge and from where: 01/01/22 [facility] How have you been since you were released from the hospital? Intermittent headache and dizziness upon standing. Settles in about 5 minutes. Non productive cough. Denies sinus drainage, chest pain, shortness of breath, blurred vision and all other symptoms. Plans to monitor BP starting tomorrow. Outpatient physical therapy in place.  Any questions or concerns? No  Items Reviewed: Did the pt receive and understand the discharge instructions provided? Yes  Medications obtained and verified? Yes  Any new allergies since your discharge? No  Dietary orders reviewed? Yes Do you have support at home? Yes   Home Care and Equipment/Supplies: Were home health services ordered? no  Functional Questionnaire: (I = Independent and D = Dependent) ADLs: I  Bathing/Dressing- wife assist as needed  Maintaining continence- I  Transferring/Ambulation- walker  Managing Meds- I  Follow up appointments reviewed:  PCP Hospital f/u appt confirmed? Yes  Scheduled to see PCP on 01/18/22 @ 1:30. Are transportation arrangements needed? No  If their condition worsens, is the pt aware to call PCP or go to the Emergency Dept.? Yes Was the patient provided with contact information for the PCP's office or ED? Yes Was to pt encouraged to call back with questions or concerns? Yes

## 2022-01-04 ENCOUNTER — Ambulatory Visit: Payer: 59 | Admitting: Physical Therapy

## 2022-01-11 ENCOUNTER — Ambulatory Visit: Payer: 59 | Admitting: Internal Medicine

## 2022-01-11 ENCOUNTER — Other Ambulatory Visit: Payer: Self-pay

## 2022-01-11 ENCOUNTER — Encounter: Payer: Self-pay | Admitting: Internal Medicine

## 2022-01-11 VITALS — BP 134/88 | HR 94 | Temp 97.5°F | Ht 74.0 in | Wt 285.0 lb

## 2022-01-11 DIAGNOSIS — H5500 Unspecified nystagmus: Secondary | ICD-10-CM

## 2022-01-11 DIAGNOSIS — J324 Chronic pansinusitis: Secondary | ICD-10-CM

## 2022-01-11 DIAGNOSIS — I10 Essential (primary) hypertension: Secondary | ICD-10-CM | POA: Diagnosis not present

## 2022-01-11 DIAGNOSIS — H7493 Unspecified disorder of middle ear and mastoid, bilateral: Secondary | ICD-10-CM

## 2022-01-11 DIAGNOSIS — R42 Dizziness and giddiness: Secondary | ICD-10-CM

## 2022-01-11 DIAGNOSIS — L739 Follicular disorder, unspecified: Secondary | ICD-10-CM | POA: Diagnosis not present

## 2022-01-11 MED ORDER — SALINE SPRAY 0.65 % NA SOLN
2.0000 | NASAL | 12 refills | Status: DC | PRN
  Filled 2022-01-11: qty 3, fill #0
  Filled 2022-02-03: qty 44, 10d supply, fill #0

## 2022-01-11 MED ORDER — MECLIZINE HCL 25 MG PO TABS
25.0000 mg | ORAL_TABLET | Freq: Two times a day (BID) | ORAL | 5 refills | Status: DC | PRN
  Filled 2022-01-11: qty 60, 30d supply, fill #0

## 2022-01-11 MED ORDER — FLUTICASONE PROPIONATE 50 MCG/ACT NA SUSP
2.0000 | Freq: Every day | NASAL | 11 refills | Status: DC | PRN
  Filled 2022-01-11: qty 16, fill #0

## 2022-01-11 MED ORDER — KETOCONAZOLE 2 % EX SHAM
1.0000 "application " | MEDICATED_SHAMPOO | CUTANEOUS | 11 refills | Status: DC
  Filled 2022-01-11: qty 120, 56d supply, fill #0

## 2022-01-11 MED ORDER — AMLODIPINE BESYLATE 5 MG PO TABS
5.0000 mg | ORAL_TABLET | Freq: Every day | ORAL | 3 refills | Status: DC
  Filled 2022-01-11 – 2022-02-03 (×2): qty 90, 90d supply, fill #0

## 2022-01-11 MED ORDER — LORATADINE 10 MG PO TABS
10.0000 mg | ORAL_TABLET | Freq: Every day | ORAL | 3 refills | Status: DC | PRN
  Filled 2022-01-11: qty 90, 90d supply, fill #0
  Filled 2022-02-03: qty 100, 100d supply, fill #0

## 2022-01-11 NOTE — Progress Notes (Signed)
Patient seen in the hospital for increased dizziness with headache/migraines and nausea. States that the dizziness and headaches are still ongoing.Taking Meclizine and Tramadol as needed for this.   Patient has been out of work since 12/24/21. Wanting to figure out what is going on so that he can go back to work or may need FMLA.   Patient is needing a new referral to Psychiatry as he no longer lives in the area and can not see Dr Maryruth Bun.

## 2022-01-11 NOTE — Progress Notes (Signed)
Chief Complaint  Patient presents with   Hospitalization Follow-up   Dizziness   Headache   Anxiety   Depression   [facility] for tia/stroke like sxs as was htn, dizzy, h/a, photophobia, room spinning imaging neg stroke + pansinusitis, mild to mod mastoid effusions R>L he started this 12/25/21 and went to the ED as well he has been out of work since 12/25/21 Not able to get fmla since only worked with [employer] <1 year 02/2022 will be 1 year but will fill out short term disability paperwork he wants to return to work given note to return 01/17/22  Still having h/a takes prn tramadol and photophobia glasses and vision checked w/in the year and has blue light protection  Anxiety/depression/adhd needs to establish with psych and therapy givne # [facility]   Htn improving on norvasc 5 mg qd   Review of Systems  Constitutional:  Negative for weight loss.  HENT:  Negative for hearing loss.   Eyes:  Positive for photophobia. Negative for blurred vision.  Respiratory:  Negative for shortness of breath.   Cardiovascular:  Negative for chest pain.  Gastrointestinal:  Negative for abdominal pain and blood in stool.  Musculoskeletal:  Negative for back pain.  Skin:  Negative for rash.  Neurological:  Positive for dizziness and headaches.  Psychiatric/Behavioral:  Negative for depression.   Past Medical History:  Diagnosis Date   ADHD (attention deficit hyperactivity disorder)    Anxiety and depression    COVID-19    12/28/19   Insomnia    Obesity (BMI 30-39.9)    Prediabetes    Past Surgical History:  Procedure Laterality Date   NO PAST SURGERIES     Family History  Problem Relation Age of Onset   Diabetes Mother    Hypertension Mother    Hyperlipidemia Mother    Diabetes Mellitus II Mother    Anxiety disorder Mother    Depression Mother    Stroke Father    Alcoholism Father    Anxiety disorder Brother    Depression Brother    Hypertension Brother    Hyperlipidemia Brother     Diabetes type II Brother    ADD / ADHD Brother    Dementia Maternal Grandfather    Social History   Socioeconomic History   Marital status: Married    Spouse name: Not on file   Number of children: Not on file   Years of education: Not on file   Highest education level: Not on file  Occupational History   Not on file  Tobacco Use   Smoking status: Never   Smokeless tobacco: Never  Vaping Use   Vaping Use: Never used  Substance and Sexual Activity   Alcohol use: Not Currently   Drug use: Not Currently   Sexual activity: Yes    Partners: Female  Other Topics Concern   Not on file  Social History Narrative   Married no kids wife is Delorise Royalsracy Snead    -wife and mother Nash ShearerKim [place] on [place]      Has siblings    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 Further refills psychiatry (Patient taking differently: Take 30 mg by mouth in the morning.)   DULoxetine (CYMBALTA) 60 MG capsule Take 1 capsule (60 mg total) by mouth daily. Further refills Dr. Maryruth [place]un[place] (Patient taking differently: Take 60 mg by mouth in the morning.)   [START ON 01/12/2022] ketoconazole (NIZORAL) 2 % shampoo Apply 1 application topically 3 (three) times a week. Let sit x 5 minutes trunk   sodium chloride (OCEAN) 0.65 % SOLN nasal spray Place 2 sprays into both nostrils as needed for congestion. Before flonase   traMADol (ULTRAM) 50 MG tablet Take 1 tablet  by mouth every 6  hours as needed.   zolpidem (AMBIEN CR) 12.5 MG CR tablet Take 1 tablet (12.5 mg total) by mouth at bedtime as needed for sleep.   [DISCONTINUED]  amLODipine (NORVASC) 5 MG tablet Take 1 tablet (5 mg total) by mouth daily.   [DISCONTINUED] fluticasone (FLONASE) 50 MCG/ACT nasal spray Place 2 sprays into both nostrils daily.   [DISCONTINUED] loratadine (CLARITIN) 10 MG tablet Take 1 tablet (10 mg total) by mouth daily.   [DISCONTINUED] meclizine (ANTIVERT) 25 MG tablet Take 1 tablet (25 mg total) by mouth 3 (three) times daily as needed for dizziness.   [DISCONTINUED] pantoprazole (PROTONIX) 40 MG tablet Take 1 tablet (40 mg total) by mouth daily.   No Known Allergies Recent Results (from the past 2160 hour(s))  Resp Panel by RT-PCR (Flu A&B, Covid) Nasopharyngeal Swab     Status: None   Collection Time: 12/25/21  6:20 PM   Specimen: Nasopharyngeal Swab; Nasopharyngeal(NP) swabs in vial transport medium  Result Value Ref Range   SARS Coronavirus 2 by RT PCR NEGATIVE NEGATIVE    Comment: (NOTE) SARS-CoV-2 target nucleic acids are NOT DETECTED.  The SARS-CoV-2 RNA is generally detectable in upper respiratory specimens during the acute phase of infection. The lowest concentration of SARS-CoV-2 viral copies this assay can detect is 138 copies/mL. A negative result does not preclude SARS-Cov-2 infection and should not be used as the sole basis for treatment or other patient management decisions. A negative result may occur with  improper specimen collection/handling, submission of specimen other than nasopharyngeal swab, presence of viral mutation(s) within the areas targeted by this assay, and inadequate number of viral copies(<138 copies/mL). A negative result must be combined with clinical observations, patient history, and epidemiological information. The expected result is Negative.  Fact Sheet for Patients:  BloggerCourse.comhttps://www.fda.gov/media/152166/download  Fact Sheet for Healthcare Providers:  SeriousBroker.ithttps://www.fda.gov/media/152162/download  This test is no t yet approved or cleared by the [place] FDA and  has been authorized for  detection and/or diagnosis of SARS-CoV-2 by FDA under an Emergency Use Authorization (EUA). This EUA will remain  in effect (meaning this test can be used) for the duration of the COVID-19 declaration under Section 564(b)(1) of the Act, 21 U.S.C.section 360bbb-3(b)(1), unless the authorization is terminated  or revoked sooner.       Influenza A by PCR NEGATIVE NEGATIVE   Influenza B by PCR NEGATIVE NEGATIVE    Comment: (NOTE) The Xpert Xpress SARS-CoV-2/FLU/RSV plus assay is intended as an aid in the diagnosis of influenza from Nasopharyngeal swab specimens and should not be used as a sole basis for treatment. Nasal washings and aspirates are unacceptable for Xpert Xpress SARS-CoV-2/FLU/RSV testing.  Fact Sheet for Patients: BloggerCourse.comhttps://www.fda.gov/media/152166/download  Fact Sheet for Healthcare Providers: SeriousBroker.ithttps://www.fda.gov/media/152162/download  This test is not  yet approved or cleared by the [place] and has been authorized for detection and/or diagnosis of SARS-CoV-2 by FDA under an Emergency Use Authorization (EUA). This EUA will remain in effect (meaning this test can be used) for the duration of the COVID-19 declaration under Section 564(b)(1) of the Act, 21 U.S.C. section 360bbb-3(b)(1), unless the authorization is terminated or revoked.  Performed at [facility], [address] [place], [place] [address]   Resp Panel by RT-PCR (Flu A&B, Covid) Nasopharyngeal Swab     Status: None   Collection Time: 12/29/21  7:15 PM   Specimen: Nasopharyngeal Swab; Nasopharyngeal(NP) swabs in vial transport medium  Result Value Ref Range   SARS Coronavirus 2 by RT PCR NEGATIVE NEGATIVE    Comment: (NOTE) SARS-CoV-2 target nucleic acids are NOT DETECTED.  The SARS-CoV-2 RNA is generally detectable in upper respiratory specimens during the acute phase of infection. The lowest concentration of SARS-CoV-2 viral copies this assay can detect is 138 copies/mL. A  negative result does not preclude SARS-Cov-2 infection and should not be used as the sole basis for treatment or other patient management decisions. A negative result may occur with  improper specimen collection/handling, submission of specimen other than nasopharyngeal swab, presence of viral mutation(s) within the areas targeted by this assay, and inadequate number of viral copies(<138 copies/mL). A negative result must be combined with clinical observations, patient history, and epidemiological information. The expected result is Negative.  Fact Sheet for Patients:  BloggerCourse.com  Fact Sheet for Healthcare Providers:  SeriousBroker.it  This test is no t yet approved or cleared by the [place] FDA and  has been authorized for detection and/or diagnosis of SARS-CoV-2 by FDA under an Emergency Use Authorization (EUA). This EUA will remain  in effect (meaning this test can be used) for the duration of the COVID-19 declaration under Section 564(b)(1) of the Act, 21 U.S.C.section 360bbb-3(b)(1), unless the authorization is terminated  or revoked sooner.       Influenza A by PCR NEGATIVE NEGATIVE   Influenza B by PCR NEGATIVE NEGATIVE    Comment: (NOTE) The Xpert Xpress SARS-CoV-2/FLU/RSV plus assay is intended as an aid in the diagnosis of influenza from Nasopharyngeal swab specimens and should not be used as a sole basis for treatment. Nasal washings and aspirates are unacceptable for Xpert Xpress SARS-CoV-2/FLU/RSV testing.  Fact Sheet for Patients: BloggerCourse.com  Fact Sheet for Healthcare Providers: SeriousBroker.it  This test is not yet approved or cleared by the [place] FDA and has been authorized for detection and/or diagnosis of SARS-CoV-2 by FDA under an Emergency Use Authorization (EUA). This EUA will remain in effect (meaning this test can be used)  for the duration of the COVID-19 declaration under Section 564(b)(1) of the Act, 21 U.S.C. section 360bbb-3(b)(1), unless the authorization is terminated or revoked.  Performed at [facility], [address] [place], [place] [address]   CBG monitoring, ED     Status: None   Collection Time: 12/30/21  1:24 PM  Result Value Ref Range   Glucose-Capillary 80 70 - 99 mg/dL    Comment: Glucose reference range applies only to samples taken after fasting for at least 8 hours.  Basic metabolic panel     Status: Abnormal   Collection Time: 12/30/21  1:38 PM  Result Value Ref Range   Sodium 133 (L) 135 - 145 mmol/L   Potassium 3.7 3.5 - 5.1 mmol/L   Chloride 103 98 - 111 mmol/L  CO2 25 22 - 32 mmol/L   Glucose, Bld 86 70 - 99 mg/dL    Comment: Glucose reference range applies only to samples taken after fasting for at least 8 hours.   BUN 14 6 - 20 mg/dL   Creatinine, Ser 1.77 0.61 - 1.24 mg/dL   Calcium 8.6 (L) 8.9 - 10.3 mg/dL   GFR, Estimated >93 >90 mL/min    Comment: (NOTE) Calculated using the CKD-EPI Creatinine Equation (2021)    Anion gap 5 5 - 15    Comment: Performed at [facility], [address], [place], [place] [address]  CBC with Differential     Status: Abnormal   Collection Time: 12/30/21  1:38 PM  Result Value Ref Range   WBC 7.0 4.0 - 10.5 K/uL   RBC 4.11 (L) 4.22 - 5.81 MIL/uL   Hemoglobin 12.4 (L) 13.0 - 17.0 g/dL   HCT 33.0 (L) 07.6 - 22.6 %   MCV 90.3 80.0 - 100.0 fL   MCH 30.2 26.0 - 34.0 pg   MCHC 33.4 30.0 - 36.0 g/dL   RDW 33.3 54.5 - 62.5 %   Platelets 291 150 - 400 K/uL   nRBC 0.0 0.0 - 0.2 %   Neutrophils Relative % 63 %   Neutro Abs 4.5 1.7 - 7.7 K/uL   Lymphocytes Relative 25 %   Lymphs Abs 1.7 0.7 - 4.0 K/uL   Monocytes Relative 9 %   Monocytes Absolute 0.6 0.1 - 1.0 K/uL   Eosinophils Relative 2 %   Eosinophils Absolute 0.2 0.0 - 0.5 K/uL   Basophils Relative 1 %   Basophils Absolute 0.0 0.0 - 0.1  K/uL   Immature Granulocytes 0 %   Abs Immature Granulocytes 0.02 0.00 - 0.07 K/uL    Comment: Performed at [facility], [address], [place], [place] [address]  Ethanol     Status: None   Collection Time: 12/30/21  1:39 PM  Result Value Ref Range   Alcohol, Ethyl (B) <10 <10 mg/dL    Comment: (NOTE) Lowest detectable limit for serum alcohol is 10 mg/dL.  For medical purposes only. Performed at [facility], [address] [place], [place] [address]   HIV Antibody (routine testing w rflx)     Status: None   Collection Time: 12/31/21  4:53 AM  Result Value Ref Range   HIV Screen 4th Generation wRfx Non Reactive Non Reactive    Comment: Performed at [facility], [address], [place], [place] [address]  Magnesium     Status: None   Collection Time: 12/31/21  4:53 AM  Result Value Ref Range   Magnesium 2.3 1.7 - 2.4 mg/dL    Comment: Performed at [facility], [address], [place], [place] [address]  Phosphorus     Status: None   Collection Time: 12/31/21  4:53 AM  Result Value Ref Range   Phosphorus 4.1 2.5 - 4.6 mg/dL    Comment: Performed at [facility], [address], [place], [place] [address]  Comprehensive metabolic panel     Status: Abnormal   Collection Time: 12/31/21  4:53 AM  Result Value Ref Range   Sodium 140 135 - 145 mmol/L    Comment: DELTA CHECK NOTED   Potassium 3.8 3.5 - 5.1 mmol/L   Chloride 102 98 - 111 mmol/L   CO2 26 22 - 32 mmol/L   Glucose, Bld 102 (H) 70 - 99  mg/dL    Comment: Glucose reference range applies only to samples taken after fasting for at least 8 hours.   BUN 12 6 - 20 mg/dL   Creatinine, Ser 5.91 0.61 - 1.24 mg/dL   Calcium 9.0 8.9 - 63.8 mg/dL   Total Protein 7.6 6.5 - 8.1 g/dL   Albumin 3.7 3.5 - 5.0 g/dL   AST 17 15 - 41 U/L   ALT 22 0 - 44 U/L   Alkaline Phosphatase 45 38 - 126 U/L   Total Bilirubin 0.7 0.3 - 1.2 mg/dL   GFR, Estimated  >46 >65 mL/min    Comment: (NOTE) Calculated using the CKD-EPI Creatinine Equation (2021)    Anion gap 12 5 - 15    Comment: Performed at [facility], [address] [place], [place] [address]  CBC     Status: Abnormal   Collection Time: 12/31/21  4:53 AM  Result Value Ref Range   WBC 6.3 4.0 - 10.5 K/uL   RBC 4.29 4.22 - 5.81 MIL/uL   Hemoglobin 12.9 (L) 13.0 - 17.0 g/dL   HCT 01.7 (L) 79.3 - 90.3 %   MCV 89.3 80.0 - 100.0 fL   MCH 30.1 26.0 - 34.0 pg   MCHC 33.7 30.0 - 36.0 g/dL   RDW 00.9 23.3 - 00.7 %   Platelets 272 150 - 400 K/uL   nRBC 0.0 0.0 - 0.2 %    Comment: Performed at [facility], [address] [place], [place] [address]  Vitamin B12     Status: None   Collection Time: 12/31/21  4:53 AM  Result Value Ref Range   Vitamin B-12 357 180 - 914 pg/mL    Comment: (NOTE) This assay is not validated for testing neonatal or myeloproliferative syndrome specimens for Vitamin B12 levels. Performed at [facility], [address] [place], [place] [address]   Folate, serum, performed at [facility] lab     Status: None   Collection Time: 12/31/21  4:53 AM  Result Value Ref Range   Folate 16.3 >5.9 ng/mL    Comment: Performed at [facility], [address], [place], [place] [address]  Glucose, capillary     Status: Abnormal   Collection Time: 12/31/21  5:54 AM  Result Value Ref Range   Glucose-Capillary 103 (H) 70 - 99 mg/dL    Comment: Glucose reference range applies only to samples taken after fasting for at least 8 hours.  CBC with Differential/Platelet     Status: Abnormal   Collection Time: 01/01/22  4:39 AM  Result Value Ref Range   WBC 5.5 4.0 - 10.5 K/uL   RBC 4.24 4.22 - 5.81 MIL/uL   Hemoglobin 12.7 (L) 13.0 - 17.0 g/dL   HCT 89.3 (L) 73.4 - 28.7 %   MCV 87.7 80.0 - 100.0 fL   MCH 30.0 26.0 - 34.0 pg   MCHC 34.1 30.0 - 36.0 g/dL   RDW 68.1 15.7 - 26.2 %   Platelets 288 150 -  400 K/uL   nRBC 0.0 0.0 - 0.2 %   Neutrophils Relative % 56 %   Neutro Abs 3.2 1.7 - 7.7 K/uL   Lymphocytes Relative 32 %   Lymphs Abs 1.7 0.7 - 4.0 K/uL   Monocytes Relative 8 %   Monocytes Absolute 0.4 0.1 - 1.0 K/uL   Eosinophils Relative 2 %   Eosinophils Absolute 0.1 0.0 - 0.5 K/uL   Basophils Relative 1 %  Basophils Absolute 0.0 0.0 - 0.1 K/uL   Immature Granulocytes 1 %   Abs Immature Granulocytes 0.03 0.00 - 0.07 K/uL    Comment: Performed at [facility], [address] [place], [place] [address]  Basic metabolic panel     Status: Abnormal   Collection Time: 01/01/22  4:39 AM  Result Value Ref Range   Sodium 138 135 - 145 mmol/L   Potassium 3.6 3.5 - 5.1 mmol/L   Chloride 105 98 - 111 mmol/L   CO2 25 22 - 32 mmol/L   Glucose, Bld 97 70 - 99 mg/dL    Comment: Glucose reference range applies only to samples taken after fasting for at least 8 hours.   BUN 9 6 - 20 mg/dL   Creatinine, Ser 6.04 0.61 - 1.24 mg/dL   Calcium 8.7 (L) 8.9 - 10.3 mg/dL   GFR, Estimated >54 >09 mL/min    Comment: (NOTE) Calculated using the CKD-EPI Creatinine Equation (2021)    Anion gap 8 5 - 15    Comment: Performed at [facility], [address], [place], [place] [address]  Magnesium     Status: None   Collection Time: 01/01/22  4:39 AM  Result Value Ref Range   Magnesium 2.3 1.7 - 2.4 mg/dL    Comment: Performed at [facility], [address] [place], [place] [address]  Glucose, capillary     Status: Abnormal   Collection Time: 01/01/22  7:55 AM  Result Value Ref Range   Glucose-Capillary 101 (H) 70 - 99 mg/dL    Comment: Glucose reference range applies only to samples taken after fasting for at least 8 hours.   Objective  Body mass index is 36.59 kg/m. Wt Readings from Last 3 Encounters:  01/11/22 285 lb (129.3 kg)  12/31/21 293 lb 1.6 oz (132.9 kg)  12/25/21 (!) 310 lb (140.6 kg)   Temp Readings from Last 3  Encounters:  01/11/22 (!) 97.5 F (36.4 C) (Temporal)  01/01/22 97.9 F (36.6 C) (Oral)  12/25/21 98.3 F (36.8 C) (Oral)   BP Readings from Last 3 Encounters:  01/11/22 134/88  01/01/22 (!) 136/96  12/25/21 (!) 144/88   Pulse Readings from Last 3 Encounters:  01/11/22 94  01/01/22 95  12/25/21 98    Physical Exam Vitals and nursing note reviewed.  Constitutional:      Appearance: Normal appearance. He is well-developed and well-groomed. He is obese.  HENT:     Head: Normocephalic and atraumatic.  Eyes:     Conjunctiva/sclera: Conjunctivae normal.     Pupils: Pupils are equal, round, and reactive to light.  Cardiovascular:     Rate and Rhythm: Normal rate and regular rhythm.     Heart sounds: Normal heart sounds.  Pulmonary:     Effort: Pulmonary effort is normal. No respiratory distress.     Breath sounds: Normal breath sounds.  Abdominal:     Tenderness: There is no abdominal tenderness.  Skin:    General: Skin is warm and moist.  Neurological:     General: No focal deficit present.     Mental Status: He is alert and oriented to person, place, and time. Mental status is at baseline.     Sensory: Sensation is intact.     Motor: Motor function is intact.     Coordination: Coordination is intact.     Gait: Gait is intact. Gait normal.  Psychiatric:        Attention  and Perception: Attention and perception normal.        Mood and Affect: Mood and affect normal.        Speech: Speech normal.        Behavior: Behavior normal. Behavior is cooperative.        Thought Content: Thought content normal.        Cognition and Memory: Cognition and memory normal.        Judgment: Judgment normal.    Assessment  Plan  Vertigo - Plan: meclizine (ANTIVERT) 25 MG tablet, Ambulatory referral to ENT Set up vestibular pt   Pansinusitis, unspecified chronicity - Plan: fluticasone (FLONASE) 50 MCG/ACT nasal spray, sodium chloride (OCEAN) 0.65 % SOLN nasal spray, Ambulatory  referral to ENT  Disorder of both mastoids - Plan: Ambulatory referral to ENT   Hypertension, improving - Plan: amLODipine (NORVASC) 5 MG tablet  Ok to fill out short term and fmla paperwork if pt gets it to me   Note for work today to return 01/17/22 been out since 12/25/21  Folliculitis - Plan: ketoconazole (NIZORAL) 2 % shampoo with panoxyl  HM See further Provider: Dr. French Ana McLean-Scocuzza-Internal Medicine

## 2022-01-11 NOTE — Patient Instructions (Addendum)
[facility] [place] -call for appointment [address]  [place] [address]  [phone]  [facility] [place]  [address] [place] [address] [phone]      [facility]  Located in: [facility] Address: [address], [place], [place] [address] Hours:  Open ? Closes 9?PM   More hours Updated by this business 11 weeks ago Phone: [phone] Appointments: [email or web address]  MD No Physician   Primary Contact Information  Phone Fax E-mail Address  [phone] Not available [address]   [address]   [place] [address]     Specialties     Otolaryngology      Sinusitis, Adult Sinusitis is inflammation of your sinuses. Sinuses are hollow spaces in the bones around your face. Your sinuses are located: Around your eyes. In the middle of your forehead. Behind your nose. In your cheekbones. Mucus normally drains out of your sinuses. When your nasal tissues become inflamed or swollen, mucus can become trapped or blocked. This allows bacteria, viruses, and fungi to grow, which leads to infection. Most infections of the sinuses are caused by a virus. Sinusitis can develop quickly. It can last for up to 4 weeks (acute) or for more than 12 weeks (chronic). Sinusitis often develops after a cold. What are the causes? This condition is caused by anything that creates swelling in the sinuses or stops mucus from draining. This includes: Allergies. Asthma. Infection from bacteria or viruses. Deformities or blockages in your nose or sinuses. Abnormal growths in the nose (nasal polyps). Pollutants, such as chemicals or irritants in the air. Infection from fungi (rare). What increases the risk? You are more likely to develop this condition if you: Have a weak body defense system (immune system). Do a lot of swimming or diving. Overuse nasal  sprays. Smoke. What are the signs or symptoms? The main symptoms of this condition are pain and a feeling of pressure around the affected sinuses. Other symptoms include: Stuffy nose or congestion. Thick drainage from your nose. Swelling and warmth over the affected sinuses. Headache. Upper toothache. A cough that may get worse at night. Extra mucus that collects in the throat or the back of the nose (postnasal drip). Decreased sense of smell and taste. Fatigue. A fever. Sore throat. Bad breath. How is this diagnosed? This condition is diagnosed based on: Your symptoms. Your medical history. A physical exam. Tests to find out if your condition is acute or chronic. This may include: Checking your nose for nasal polyps. Viewing your sinuses using a device that has a light (endoscope). Testing for allergies or bacteria. Imaging tests, such as an MRI or CT scan. In rare cases, a bone biopsy may be done to rule out more serious types of fungal sinus disease. How is this treated? Treatment for sinusitis depends on the cause and whether your condition is chronic or acute. If caused by a virus, your symptoms should go away on their own within 10 days. You may be given medicines to relieve symptoms. They include: Medicines that shrink swollen nasal passages (topical intranasal decongestants). Medicines that treat allergies (antihistamines). A spray that eases inflammation of the nostrils (topical intranasal corticosteroids). Rinses that help get rid of thick mucus in your nose (nasal saline washes). If caused by bacteria, your health care provider may recommend waiting to see if your symptoms improve. Most bacterial infections will get better without antibiotic medicine.  You may be given antibiotics if you have: A severe infection. A weak immune system. If caused by narrow nasal passages or nasal polyps, you may need to have surgery. Follow these instructions at home: Medicines Take,  use, or apply over-the-counter and prescription medicines only as told by your health care provider. These may include nasal sprays. If you were prescribed an antibiotic medicine, take it as told by your health care provider. Do not stop taking the antibiotic even if you start to feel better. Hydrate and humidify  Drink enough fluid to keep your urine pale yellow. Staying hydrated will help to thin your mucus. Use a cool mist humidifier to keep the humidity level in your home above 50%. Inhale steam for 10-15 minutes, 3-4 times a day, or as told by your health care provider. You can do this in the bathroom while a hot shower is running. Limit your exposure to cool or dry air. Rest Rest as much as possible. Sleep with your head raised (elevated). Make sure you get enough sleep each night. General instructions  Apply a warm, moist washcloth to your face 3-4 times a day or as told by your health care provider. This will help with discomfort. Wash your hands often with soap and water to reduce your exposure to germs. If soap and water are not available, use hand sanitizer. Do not smoke. Avoid being around people who are smoking (secondhand smoke). Keep all follow-up visits as told by your health care provider. This is important. Contact a health care provider if: You have a fever. Your symptoms get worse. Your symptoms do not improve within 10 days. Get help right away if: You have a severe headache. You have persistent vomiting. You have severe pain or swelling around your face or eyes. You have vision problems. You develop confusion. Your neck is stiff. You have trouble breathing. Summary Sinusitis is soreness and inflammation of your sinuses. Sinuses are hollow spaces in the bones around your face. This condition is caused by nasal tissues that become inflamed or swollen. The swelling traps or blocks the flow of mucus. This allows bacteria, viruses, and fungi to grow, which leads to  infection. If you were prescribed an antibiotic medicine, take it as told by your health care provider. Do not stop taking the antibiotic even if you start to feel better. Keep all follow-up visits as told by your health care provider. This is important. This information is not intended to replace advice given to you by your health care provider. Make sure you discuss any questions you have with your health care provider. Document Revised: 05/12/2018 Document Reviewed: 05/12/2018 Elsevier Patient Education  2022 Elsevier Inc.   Vertigo Vertigo is the feeling that you or your surroundings are moving when they are not. This feeling can come and go at any time. Vertigo often goes away on its own. Vertigo can be dangerous if it occurs while you are doing something that could endanger yourself or others, such as driving or operating machinery. Your health care provider will do tests to try to determine the cause of your vertigo. Tests will also help your health care provider decide how best to treat your condition. Follow these instructions at home: Eating and drinking   Dehydration can make vertigo worse. Drink enough fluid to keep your urine pale yellow. Do not drink alcohol. Activity Return to your normal activities as told by your health care provider. Ask your health care provider what activities are safe for you.  In the morning, first sit up on the side of the bed. When you feel okay, stand slowly while you hold onto something until you know that your balance is fine. Move slowly. Avoid sudden body or head movements or certain positions, as told by your health care provider. If you have trouble walking or keeping your balance, try using a cane for stability. If you feel dizzy or unstable, sit down right away. Avoid doing any tasks that would cause danger to you or others if vertigo occurs. Avoid bending down if you feel dizzy. Place items in your home so that they are easy for you to reach  without bending or leaning over. Do not drive or use machinery if you feel dizzy. General instructions Take over-the-counter and prescription medicines only as told by your health care provider. Keep all follow-up visits. This is important. Contact a health care provider if: Your medicines do not relieve your vertigo or they make it worse. Your condition gets worse or you develop new symptoms. You have a fever. You develop nausea or vomiting, or if nausea gets worse. Your family or friends notice any behavioral changes. You have numbness or a prickling and tingling sensation in part of your body. Get help right away if you: Are always dizzy or you faint. Develop severe headaches. Develop a stiff neck. Develop sensitivity to light. Have difficulty moving or speaking. Have weakness in your hands, arms, or legs. Have changes in your hearing or vision. These symptoms may represent a serious problem that is an emergency. Do not wait to see if the symptoms will go away. Get medical help right away. Call your local emergency services (911 in the [place] Do not drive yourself to the hospital. Summary Vertigo is the feeling that you or your surroundings are moving when they are not. Your health care provider will do tests to try to determine the cause of your vertigo. Follow instructions for home care. You may be told to avoid certain tasks, positions, or movements. Contact a health care provider if your medicines do not relieve your symptoms, or if you have a fever, nausea, vomiting, or changes in behavior. Get help right away if you have severe headaches or difficulty speaking, or you develop hearing or vision problems. This information is not intended to replace advice given to you by your health care provider. Make sure you discuss any questions you have with your health care provider. Document Revised: 11/09/2020 Document Reviewed: 11/09/2020 Elsevier Patient Education  2022 [employer]

## 2022-01-16 ENCOUNTER — Encounter: Payer: Self-pay | Admitting: Internal Medicine

## 2022-01-16 ENCOUNTER — Ambulatory Visit: Payer: 59 | Attending: Internal Medicine | Admitting: Physical Therapy

## 2022-01-16 ENCOUNTER — Other Ambulatory Visit: Payer: Self-pay

## 2022-01-16 DIAGNOSIS — R293 Abnormal posture: Secondary | ICD-10-CM

## 2022-01-16 DIAGNOSIS — R262 Difficulty in walking, not elsewhere classified: Secondary | ICD-10-CM | POA: Diagnosis not present

## 2022-01-16 DIAGNOSIS — J0140 Acute pansinusitis, unspecified: Secondary | ICD-10-CM | POA: Diagnosis not present

## 2022-01-16 DIAGNOSIS — R42 Dizziness and giddiness: Secondary | ICD-10-CM

## 2022-01-16 DIAGNOSIS — M542 Cervicalgia: Secondary | ICD-10-CM

## 2022-01-16 NOTE — Therapy (Signed)
Depression, recurrent  (HCC) 09/09/2019   GAD (generalized anxiety disorder) 08/17/2019   ADD (attention deficit disorder) 07/24/2019   Binge eating disorder 07/24/2019   Vitamin D deficiency 06/11/2019   Elevated liver enzymes 06/11/2019   Prediabetes    Obesity (BMI 30-39.9)    Insomnia    Major depressive disorder, recurrent episode, in partial remission (HCC)     Jena GaussElizabeth J Renly Roots, PT, DPT  01/16/2022, 12:19 PM  [facility] [address]  [address] [place], [place], [address] Phone: [phone]   Fax:  [phone]  Name: Cory MornRay Cooper II Gomez MRN: [id number] Date of Birth: [birth year]  Depression, recurrent  (HCC) 09/09/2019   GAD (generalized anxiety disorder) 08/17/2019   ADD (attention deficit disorder) 07/24/2019   Binge eating disorder 07/24/2019   Vitamin D deficiency 06/11/2019   Elevated liver enzymes 06/11/2019   Prediabetes    Obesity (BMI 30-39.9)    Insomnia    Major depressive disorder, recurrent episode, in partial remission (HCC)     Jena GaussElizabeth J Renly Roots, PT, DPT  01/16/2022, 12:19 PM  [facility] [address]  [address] [place], [place], [address] Phone: [phone]   Fax:  [phone]  Name: Cory MornRay Cooper II Gomez MRN: [id number] Date of Birth: [birth year]  [facility] [address]  [address] [place], [place], [address] Phone: [phone]   Fax:  [phone]  Physical Therapy Evaluation  Patient Details  Name: Cory Gomez MRN: [id number] Date of Birth: July 02, 1981 Referring Provider (PT): Ramiro Harvest MD   Encounter Date: 01/16/2022   PT End of Session - 01/16/22 1157     Visit Number 1    PT Start Time 1100    PT Stop Time 1150    PT Time Calculation (min) 50 min             Past Medical History:  Diagnosis Date   ADHD (attention deficit hyperactivity disorder)    Anxiety and depression    COVID-19    12/28/19   Insomnia    Obesity (BMI 30-39.9)    Prediabetes     Past Surgical History:  Procedure Laterality Date   NO PAST SURGERIES      There were no vitals filed for this visit.    Subjective Assessment - 01/16/22 1155     Subjective Pt. reports having flu like symptoms beginning of January, that worsened significantly and ended up dizzy, losing balance, falling, taken by ambulance to [facility], admitted 2 days.  Since that time he reports headaches have improved, but still frequently dizzy, unable to drive or work.    Pertinent History D/C summary on 01/09/22 - Previously healthy 41 year old male presenting for dizziness and ataxia.  No central etiology was identified on CT or MRI imaging.  Patient was found to have bilateral mastoid effusions with associated mastoid tenderness.  Symptoms were not able to be relieved while in the ED.  IV antibiotics were initiated and patient was admitted for continued management of mastoiditis and observation    Currently in Pain? No/denies                [facility] PT Assessment - 01/16/22 0001       Assessment   Medical Diagnosis J01.40 (ICD-10-CM) - Acute pansinusitis, recurrence not specified    Referring Provider (PT) Ramiro Harvest MD    Onset Date/Surgical Date 12/26/21      Precautions    Precautions Fall      Balance Screen   Has the patient fallen in the past 6 months Yes    How many times? 1    Has the patient had a decrease in activity level because of a fear of falling?  Yes    Is the patient reluctant to leave their home because of a fear of falling?  Yes      Home [employer] information [employer] residence      Prior Function   Level of Independence Independent    Vocation Full time employment    Vocation Requirements [employer], traveling/driving between sites      Cognition   Overall Cognitive Status Within Functional Limits for tasks assessed      Observation/Other Assessments   Focus on Therapeutic Outcomes (FOTO)  vestibular 48%      Posture/Postural Control   Posture/Postural Control Postural limitations    Postural Limitations Rounded Shoulders;Forward head      ROM / Strength   AROM / PROM / Strength AROM      AROM   Overall AROM  Deficits    Overall AROM Comments very gaurded movements, also increased dizziness with looking down today as well as up    AROM Assessment Site Cervical  Depression, recurrent  (HCC) 09/09/2019   GAD (generalized anxiety disorder) 08/17/2019   ADD (attention deficit disorder) 07/24/2019   Binge eating disorder 07/24/2019   Vitamin D deficiency 06/11/2019   Elevated liver enzymes 06/11/2019   Prediabetes    Obesity (BMI 30-39.9)    Insomnia    Major depressive disorder, recurrent episode, in partial remission (HCC)     Jena GaussElizabeth J Renly Roots, PT, DPT  01/16/2022, 12:19 PM  [facility] [address]  [address] [place], [place], [address] Phone: [phone]   Fax:  [phone]  Name: Cory MornRay Cooper II Gomez MRN: [id number] Date of Birth: [birth year]

## 2022-01-17 ENCOUNTER — Encounter: Payer: Self-pay | Admitting: Internal Medicine

## 2022-01-17 ENCOUNTER — Telehealth: Payer: Self-pay | Admitting: Internal Medicine

## 2022-01-17 NOTE — Telephone Encounter (Signed)
Patient seen for dizziness 01/11/22. Please advise on work for Patient

## 2022-01-17 NOTE — Telephone Encounter (Signed)
Has ENT referral been scheduled?

## 2022-01-18 ENCOUNTER — Ambulatory Visit: Payer: 59 | Admitting: Internal Medicine

## 2022-01-18 ENCOUNTER — Telehealth: Payer: Self-pay | Admitting: Internal Medicine

## 2022-01-18 NOTE — Addendum Note (Signed)
Addended by: Quentin Ore on: 01/18/2022 01:18 PM   Modules accepted: Orders

## 2022-01-18 NOTE — Telephone Encounter (Signed)
-----   Message from Delorise Jackson, MD sent at 01/18/2022  1:15 PM EST ----- Regarding: RE: vertigo Ok will refer to neurology Deavion Strider inform patient   ----- Message ----- From: Rennie Natter, PT Sent: 01/17/2022   1:07 PM EST To: Nino Glow McLean-Scocuzza, MD Subject: vertigo                                        I saw Mr. Caminero yesterday for vertigo eval and referred him to our specialty clinic at [facility].  He may also benefit from referral to neurologist as well.  He could not track objects and he had direction changing nystagmus.  He is not safe to drive at all, so he may also need short term disability.    Sharlene Motts, PT

## 2022-01-22 DIAGNOSIS — H5500 Unspecified nystagmus: Secondary | ICD-10-CM

## 2022-01-22 HISTORY — DX: Unspecified nystagmus: H55.00

## 2022-01-23 ENCOUNTER — Ambulatory Visit: Payer: 59 | Admitting: Physical Therapy

## 2022-01-23 ENCOUNTER — Encounter: Payer: Self-pay | Admitting: Internal Medicine

## 2022-01-24 ENCOUNTER — Ambulatory Visit: Payer: 59 | Admitting: Diagnostic Neuroimaging

## 2022-01-24 ENCOUNTER — Encounter: Payer: Self-pay | Admitting: Diagnostic Neuroimaging

## 2022-01-24 ENCOUNTER — Other Ambulatory Visit: Payer: Self-pay

## 2022-01-24 VITALS — BP 136/84 | HR 106 | Ht 74.0 in | Wt 286.5 lb

## 2022-01-24 DIAGNOSIS — R42 Dizziness and giddiness: Secondary | ICD-10-CM | POA: Diagnosis not present

## 2022-01-24 DIAGNOSIS — J328 Other chronic sinusitis: Secondary | ICD-10-CM | POA: Diagnosis not present

## 2022-01-24 DIAGNOSIS — G43109 Migraine with aura, not intractable, without status migrainosus: Secondary | ICD-10-CM

## 2022-01-24 DIAGNOSIS — Z0279 Encounter for issue of other medical certificate: Secondary | ICD-10-CM

## 2022-01-24 NOTE — Patient Instructions (Addendum)
VERTIGO / DIZZINESS / HEADACHES (due to prodromal sinus infx; now recovering; also some migraine features) - continue meclizine as needed - continue ibuprofen / tylenol as needed - To prevent or relieve headaches, try the following: Cool Compress. Lie down and place a cool compress on your head.   Avoid headache triggers. If certain foods or odors seem to have triggered your migraines in the past, avoid them. A headache diary might help you identify triggers.   Include physical activity in your daily routine.  Manage stress. Find healthy ways to cope with the stressors, such as delegating tasks on your to-do list.   Practice relaxation techniques. Try deep breathing, yoga, massage and visualization.   Eat regularly. Eating regularly scheduled meals and maintaining a healthy diet might help prevent headaches. Also, drink plenty of fluids.   Follow a regular sleep schedule. Sleep deprivation might contribute to headaches Consider biofeedback. With this mind-body technique, you learn to control certain bodily functions -- such as muscle tension, heart rate and blood pressure -- to prevent headaches or reduce headache pain.

## 2022-01-24 NOTE — Telephone Encounter (Signed)
Called patient. Unable to LM.  

## 2022-01-24 NOTE — Progress Notes (Signed)
[facility]  PATIENT: Cory Gomez DOB: Aug 29, 1981  REFERRING CLINICIAN: McLean-Scocuzza, French Ana * HISTORY FROM: patient  REASON FOR VISIT: new consult    HISTORICAL  CHIEF COMPLAINT:  Chief Complaint  Patient presents with   New Patient (Initial Visit)    Rm 7 with wife here for consult on worsening dizziness. Pt reports dizziness episodes are 3-4 times per day. On meclizine some benefit noted. Concerned about possible TIA?    HISTORY OF PRESENT ILLNESS:   41 year old male here for evaluation of dizziness and headaches.  12/24/2021 patient had onset of headaches, body aches, fevers, productive cough, sore throat, ear fullness.  12/25/2021 patient went to the emergency room for evaluation and was diagnosed with viral syndrome (covid negative).  12/29/2021 patient was at work, felt nauseated, dizzy, could not drive.  Called his wife to help take him home.  When he got out to the parking lot he vomited and fell down.  He was slurring his speech and had memory lapse.  Patient was taken to the emergency room for evaluation.  MRI brain was negative for acute stroke.  CTA and CTV of the head were also unremarkable.  He was diagnosed with severe pansinusitis and treated with antibiotics.  Since that time symptoms have slightly improved.  He still has 3 or 4 episodes per week of intermittent spinning sensation, nausea, headaches.  He has some sensitive to light.   REVIEW OF SYSTEMS: Full 14 system review of systems performed and negative with exception of: as per HPI.  ALLERGIES: No Known Allergies  HOME MEDICATIONS: Outpatient Medications Prior to Visit  Medication Sig Dispense Refill   acetaminophen (TYLENOL) 500 MG tablet Take 500-1,000 mg by mouth every 6 (six) hours as needed (for headaches).     amLODipine (NORVASC) 5 MG tablet Take 1 tablet (5 mg total) by mouth daily. 90 tablet 3   amphetamine-dextroamphetamine (ADDERALL XR) 30 MG 24 hr capsule Take 1  capsule (30 mg total) by mouth every morning. Further refills psychiatry (Patient taking differently: Take 30 mg by mouth in the morning.) 30 capsule 0   Cholecalciferol 1.25 MG (50000 UT) capsule Take 1 capsule (50,000 Units total) by mouth once a week. 13 capsule 1   DULoxetine (CYMBALTA) 60 MG capsule Take 1 capsule (60 mg total) by mouth daily. Further refills Dr. Maryruth Bun (Patient taking differently: Take 60 mg by mouth in the morning.) 90 capsule 1   fluticasone (FLONASE) 50 MCG/ACT nasal spray Place 2 sprays into both nostrils daily as needed for allergies or rhinitis. 16 g 11   ketoconazole (NIZORAL) 2 % shampoo Apply 1 application topically 3 (three) times a week. Let sit x 5 minutes trunk 120 mL 11   loratadine (CLARITIN) 10 MG tablet Take 1 tablet (10 mg total) by mouth daily as needed for allergies. 90 tablet 3   meclizine (ANTIVERT) 25 MG tablet Take 1 tablet (25 mg total) by mouth 2 (two) times daily as needed for dizziness. 60 tablet 5   sodium chloride (OCEAN) 0.65 % SOLN nasal spray Place 2 sprays into both nostrils as needed for congestion. Before flonase 3 mL 12   traMADol (ULTRAM) 50 MG tablet Take 1 tablet  by mouth every 6  hours as needed. 15 tablet 0   zolpidem (AMBIEN CR) 12.5 MG CR tablet Take 1 tablet (12.5 mg total) by mouth at bedtime as needed for sleep. 30 tablet 5   No facility-administered medications prior to visit.  PAST MEDICAL HISTORY: Past Medical History:  Diagnosis Date   ADHD (attention deficit hyperactivity disorder)    Anxiety and depression    COVID-19    12/28/19   Insomnia    Obesity (BMI 30-39.9)    Prediabetes     PAST SURGICAL HISTORY: Past Surgical History:  Procedure Laterality Date   NO PAST SURGERIES      FAMILY HISTORY: Family History  Problem Relation Age of Onset   Diabetes Mother    Hypertension Mother    Hyperlipidemia Mother    Diabetes Mellitus II Mother    Anxiety disorder Mother    Depression Mother    Stroke Father     Alcoholism Father    Anxiety disorder Brother    Depression Brother    Hypertension Brother    Hyperlipidemia Brother    Diabetes type II Brother    ADD / ADHD Brother    Dementia Maternal Grandfather     SOCIAL HISTORY: Social History   Socioeconomic History   Marital status: Married    Spouse name: Not on file   Number of children: Not on file   Years of education: Not on file   Highest education level: Not on file  Occupational History   Not on file  Tobacco Use   Smoking status: Never   Smokeless tobacco: Never  Vaping Use   Vaping Use: Never used  Substance and Sexual Activity   Alcohol use: Not Currently   Drug use: Not Currently   Sexual activity: Yes    Partners: Female  Other Topics Concern   Not on file  Social History Narrative   Married no kids wife is Corion Guinan    -wife and mother [name] on [place]      Has siblings    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   1 dog    Social Determinants of Corporate investment banker Strain: Not on Ship broker Insecurity: Not on file  Transportation Needs: Not on file  Physical Activity: Not on file  Stress: Not on file  Social Connections: Not on file  Intimate Partner Violence: Not on file     PHYSICAL EXAM  GENERAL EXAM/CONSTITUTIONAL: Vitals:  Vitals:   01/24/22 1020  BP: 136/84  Pulse: (!) 106  SpO2: 98%  Weight: 286 lb 8 oz (130 kg)  Height: 6\' 2"  (1.88 m)   Body mass index is 36.78 kg/m. Wt Readings from Last 3 Encounters:  01/24/22 286 lb 8 oz (130 kg)  01/11/22 285 lb (129.3 kg)  12/31/21 293 lb 1.6 oz (132.9 kg)   Patient is in no distress; well developed, nourished and groomed; neck is supple  CARDIOVASCULAR: Examination of carotid arteries is normal; no carotid bruits Regular rate and rhythm, no murmurs Examination of peripheral vascular system by observation and palpation is normal  EYES: Ophthalmoscopic exam of optic discs and posterior segments is normal; no papilledema or  hemorrhages No results found.  MUSCULOSKELETAL: Gait, strength, tone, movements noted in Neurologic exam below  NEUROLOGIC: MENTAL STATUS:  No flowsheet data found. awake, alert, oriented to person, place and time recent and remote memory intact normal attention and concentration language fluent, comprehension intact, naming intact fund of knowledge appropriate  CRANIAL NERVE:  2nd - no papilledema on fundoscopic exam 2nd, 3rd, 4th, 6th - pupils equal and reactive to light, visual fields full to confrontation, extraocular muscles --> SOME INTERMITTENT DIFFICULTY WITH EYE MOVEMENTS, BUT INTERMITTENT, no nystagmus 5th - facial  sensation symmetric 7th - facial strength symmetric 8th - hearing intact 9th - palate elevates symmetrically, uvula midline 11th - shoulder shrug symmetric 12th - tongue protrusion midline  MOTOR:  normal bulk and tone, full strength in the BUE, BLE  SENSORY:  normal and symmetric to light touch, temperature, vibration  COORDINATION:  finger-nose-finger, fine finger movements normal  REFLEXES:  deep tendon reflexes TRACE and symmetric  GAIT/STATION:  narrow based gait     DIAGNOSTIC DATA (LABS, IMAGING, TESTING) - I reviewed patient records, labs, notes, testing and imaging myself where available.  Lab Results  Component Value Date   WBC 5.5 01/01/2022   HGB 12.7 (L) 01/01/2022   HCT 37.2 (L) 01/01/2022   MCV 87.7 01/01/2022   PLT 288 01/01/2022      Component Value Date/Time   NA 138 01/01/2022 0439   K 3.6 01/01/2022 0439   CL 105 01/01/2022 0439   CO2 25 01/01/2022 0439   GLUCOSE 97 01/01/2022 0439   BUN 9 01/01/2022 0439   CREATININE 0.71 01/01/2022 0439   CALCIUM 8.7 (L) 01/01/2022 0439   PROT 7.6 12/31/2021 0453   ALBUMIN 3.7 12/31/2021 0453   AST 17 12/31/2021 0453   ALT 22 12/31/2021 0453   ALKPHOS 45 12/31/2021 0453   BILITOT 0.7 12/31/2021 0453   GFRNONAA >60 01/01/2022 0439   Lab Results  Component Value Date    CHOL 199 06/14/2021   HDL 27.90 (L) 06/14/2021   LDLCALC 102 (H) 06/11/2019   LDLDIRECT 144.0 06/14/2021   TRIG 227.0 (H) 06/14/2021   CHOLHDL 7 06/14/2021   Lab Results  Component Value Date   HGBA1C 6.0 06/14/2021   Lab Results  Component Value Date   VITAMINB12 357 12/31/2021   Lab Results  Component Value Date   TSH 2.91 06/14/2021    12/30/21 MRI brain [I reviewed images myself and agree with interpretation. -VRP]  1. No evidence of acute intracranial abnormality. 2. Severe paranasal sinus disease, suggestive of sinusitis. 3. Moderate right and small left mastoid effusions.    ASSESSMENT AND PLAN  41 y.o. year old male here with:   Dx:  1. Vertigo   2. Migraine with aura and without status migrainosus, not intractable   3. Other chronic sinusitis      PLAN:  VERTIGO / DIZZINESS / HEADACHES (due to prodromal sinus infx; now recovering; also some migraine features) - continue meclizine as needed - continue ibuprofen / tylenol as needed - may consider topiramate in future - To prevent or relieve headaches, try the following: Cool Compress. Lie down and place a cool compress on your head.   Avoid headache triggers. If certain foods or odors seem to have triggered your migraines in the past, avoid them. A headache diary might help you identify triggers.   Include physical activity in your daily routine.  Manage stress. Find healthy ways to cope with the stressors, such as delegating tasks on your to-do list.   Practice relaxation techniques. Try deep breathing, yoga, massage and visualization.   Eat regularly. Eating regularly scheduled meals and maintaining a healthy diet might help prevent headaches. Also, drink plenty of fluids.   Follow a regular sleep schedule. Sleep deprivation might contribute to headaches Consider biofeedback. With this mind-body technique, you learn to control certain bodily functions -- such as muscle tension, heart rate and blood  pressure -- to prevent headaches or reduce headache pain.  Return for pending if symptoms worsen or fail to improve, return to PCP.  Suanne Marker, MD 01/24/2022, 11:08 AM Certified in Neurology, Neurophysiology and Neuroimaging  [facility] [address], [address] [place], [place] [address] [phone]

## 2022-01-25 ENCOUNTER — Other Ambulatory Visit: Payer: Self-pay

## 2022-01-25 ENCOUNTER — Encounter: Payer: Self-pay | Admitting: Physical Therapy

## 2022-01-25 ENCOUNTER — Ambulatory Visit: Payer: 59 | Attending: Internal Medicine | Admitting: Physical Therapy

## 2022-01-25 DIAGNOSIS — M542 Cervicalgia: Secondary | ICD-10-CM | POA: Insufficient documentation

## 2022-01-25 DIAGNOSIS — R293 Abnormal posture: Secondary | ICD-10-CM | POA: Insufficient documentation

## 2022-01-25 DIAGNOSIS — R262 Difficulty in walking, not elsewhere classified: Secondary | ICD-10-CM | POA: Diagnosis not present

## 2022-01-25 DIAGNOSIS — R42 Dizziness and giddiness: Secondary | ICD-10-CM | POA: Insufficient documentation

## 2022-01-25 NOTE — Therapy (Signed)
[facility] [address], [place], [place], [address] Phone: [phone]   Fax:  [phone]  Physical Therapy Treatment  Patient Details  Name: Cory Gomez MRN: [id number] Date of Birth: 19-Sep-1981 Referring Provider (PT): Irine Seal MD   Encounter Date: 01/25/2022   PT End of Session - 01/25/22 1726     Visit Number 2    Date for PT Re-Evaluation 02/27/22    Authorization Type [facility]    Authorization - Visit Number 2    Authorization - Number of Visits 25    PT Start Time [id number]    PT Stop Time 1616    PT Time Calculation (min) 45 min    Activity Tolerance Patient tolerated treatment well    Behavior During Therapy [facility] for tasks assessed/performed             Past Medical History:  Diagnosis Date   ADHD (attention deficit hyperactivity disorder)    Anxiety and depression    COVID-19    12/28/19   Insomnia    Obesity (BMI 30-39.9)    Prediabetes     Past Surgical History:  Procedure Laterality Date   NO PAST SURGERIES      There were no vitals filed for this visit.   Subjective Assessment - 01/25/22 1532     Subjective Not feeling much different. Feels like the dizziness has gotten worse. Saw neurology who said that it could be migraines but does not have a previous hx of migraines. Limited in work and driving. Dizzy spells occur several times a day and last minutes. Described as spinning. Worse with head turns, getting up quickly, sometimes spontaneous and when reading. Better when closing eyes and with Meclizine. New onset of HAs located over the frontal aspect of the head. Better with Tylenol sinus. Worsened photo/phonophobia recently, aural fullness in R ear. Notes occasional diplopia at baseline and hx of night blindness; denies new glasses recently. Denies head trauma, vision changes/double vision, hearing loss, tinnitus.    Pertinent History D/C summary on 01/09/22 - Previously healthy 41 year old  male presenting for dizziness and ataxia.  No central etiology was identified on CT or MRI imaging.  Patient was found to have bilateral mastoid effusions with associated mastoid tenderness.  Symptoms were not able to be relieved while in the ED.  IV antibiotics were initiated and patient was admitted for continued management of mastoiditis and observation    Diagnostic tests 12/30/21 brain MRI: No evidence of acute intracranial abnormality. Severe paranasal sinus disease, suggestive of sinusitis.Moderate right and small left mastoid effusions.    Patient Stated Goals improve dizziness    Currently in Pain? Yes    Pain Score 4     Pain Location Head    Pain Orientation Anterior    Pain Descriptors / Indicators Aching    Pain Type Acute pain                [facility] PT Assessment - 01/25/22 0001       Coordination   Gross Motor Movements are Fluid and Coordinated No    Coordination and Movement Description alt pronation/supination very slow B    Finger Nose Finger Test bradykinesia B    Heel Shin Test B dysmetria      Ambulation/Gait   Assistive device None    Gait Pattern Step-through pattern   path deviation, imbalance   Ambulation Surface Level;Indoor    Gait velocity slightly decreased  [facility] [address] [place], [place], [address] Phone: [phone]   Fax:  [phone]  Name: Cory Gomez MRN: [id number] Date of Birth: 1981-01-25  [facility] [address] [place], [place], [address] Phone: [phone]   Fax:  [phone]  Name: Cory Gomez MRN: [id number] Date of Birth: 1981-01-25  [facility] [address], [place], [place], [address] Phone: [phone]   Fax:  [phone]  Physical Therapy Treatment  Patient Details  Name: Cory Gomez MRN: [id number] Date of Birth: 19-Sep-1981 Referring Provider (PT): Irine Seal MD   Encounter Date: 01/25/2022   PT End of Session - 01/25/22 1726     Visit Number 2    Date for PT Re-Evaluation 02/27/22    Authorization Type [facility]    Authorization - Visit Number 2    Authorization - Number of Visits 25    PT Start Time [id number]    PT Stop Time 1616    PT Time Calculation (min) 45 min    Activity Tolerance Patient tolerated treatment well    Behavior During Therapy [facility] for tasks assessed/performed             Past Medical History:  Diagnosis Date   ADHD (attention deficit hyperactivity disorder)    Anxiety and depression    COVID-19    12/28/19   Insomnia    Obesity (BMI 30-39.9)    Prediabetes     Past Surgical History:  Procedure Laterality Date   NO PAST SURGERIES      There were no vitals filed for this visit.   Subjective Assessment - 01/25/22 1532     Subjective Not feeling much different. Feels like the dizziness has gotten worse. Saw neurology who said that it could be migraines but does not have a previous hx of migraines. Limited in work and driving. Dizzy spells occur several times a day and last minutes. Described as spinning. Worse with head turns, getting up quickly, sometimes spontaneous and when reading. Better when closing eyes and with Meclizine. New onset of HAs located over the frontal aspect of the head. Better with Tylenol sinus. Worsened photo/phonophobia recently, aural fullness in R ear. Notes occasional diplopia at baseline and hx of night blindness; denies new glasses recently. Denies head trauma, vision changes/double vision, hearing loss, tinnitus.    Pertinent History D/C summary on 01/09/22 - Previously healthy 41 year old  male presenting for dizziness and ataxia.  No central etiology was identified on CT or MRI imaging.  Patient was found to have bilateral mastoid effusions with associated mastoid tenderness.  Symptoms were not able to be relieved while in the ED.  IV antibiotics were initiated and patient was admitted for continued management of mastoiditis and observation    Diagnostic tests 12/30/21 brain MRI: No evidence of acute intracranial abnormality. Severe paranasal sinus disease, suggestive of sinusitis.Moderate right and small left mastoid effusions.    Patient Stated Goals improve dizziness    Currently in Pain? Yes    Pain Score 4     Pain Location Head    Pain Orientation Anterior    Pain Descriptors / Indicators Aching    Pain Type Acute pain                [facility] PT Assessment - 01/25/22 0001       Coordination   Gross Motor Movements are Fluid and Coordinated No    Coordination and Movement Description alt pronation/supination very slow B    Finger Nose Finger Test bradykinesia B    Heel Shin Test B dysmetria      Ambulation/Gait   Assistive device None    Gait Pattern Step-through pattern   path deviation, imbalance   Ambulation Surface Level;Indoor    Gait velocity slightly decreased

## 2022-01-25 NOTE — Patient Instructions (Signed)
Access Code: [email or web address] URL: [email or web address] Date: 01/25/2022 Prepared by: [facility]  Exercises Seated Horizontal Saccades - 1 x daily - 5 x weekly - 2 sets - 10 reps Seated Vertical Saccades - 1 x daily - 5 x weekly - 2 sets - 10 reps Seated Horizontal Smooth Pursuit - 1 x daily - 5 x weekly - 2 sets - 10 reps Brandt-Daroff Vestibular Exercise - 1 x daily - 5 x weekly - 2 sets - 5 reps

## 2022-01-26 ENCOUNTER — Telehealth: Payer: Self-pay | Admitting: Physical Therapy

## 2022-01-26 NOTE — Telephone Encounter (Signed)
Hi Dr. Marjory Gomez,  [name] was seen in O[facility] for vertigo on 01/25/22. He presented with some abnormal oculomotor and coordination testing including: slow and delayed smooth pursuit, slowness and undershooting with saccades, inability to maintain gaze with VOR cancellation, and bradykinesia and slight dysmetria with UE/LE coordination testing. I know he was seen by you on 01/24/22 for assessment and imaging was clear, but wanted to make you aware. Please advise if any changes need to be made to PT POC or if further workup needed.   Thanks!  Cory Gomez, PT, DPT

## 2022-01-30 ENCOUNTER — Telehealth: Payer: Self-pay | Admitting: Internal Medicine

## 2022-01-30 ENCOUNTER — Encounter: Payer: 59 | Admitting: Physical Therapy

## 2022-01-30 NOTE — Telephone Encounter (Signed)
Patient dropped off work accommodation form off for Dr French Ana. Forms are up front in Dr Shauna Hugh color folder.

## 2022-02-01 ENCOUNTER — Other Ambulatory Visit: Payer: Self-pay

## 2022-02-01 ENCOUNTER — Encounter: Payer: Self-pay | Admitting: Physical Therapy

## 2022-02-01 ENCOUNTER — Ambulatory Visit: Payer: 59 | Admitting: Physical Therapy

## 2022-02-01 DIAGNOSIS — M542 Cervicalgia: Secondary | ICD-10-CM | POA: Diagnosis not present

## 2022-02-01 DIAGNOSIS — R293 Abnormal posture: Secondary | ICD-10-CM

## 2022-02-01 DIAGNOSIS — R42 Dizziness and giddiness: Secondary | ICD-10-CM | POA: Diagnosis not present

## 2022-02-01 DIAGNOSIS — R262 Difficulty in walking, not elsewhere classified: Secondary | ICD-10-CM | POA: Diagnosis not present

## 2022-02-01 NOTE — Therapy (Signed)
with EC and wide stance on foam. Demonstrated consisted retropulsion throughout. Initiated gentle cervical ROM and stretching which was somewhat limited by  neck pain. Also worked on gaze stabilization to targets at slow pace to tolerance. Updated HEP with exercises that were well-tolerated today and educated on safe performance of balance HEP in corner. Patient and wife reported understanding and without complaints at end of session.    Examination-Activity Limitations Stand;Locomotion Level;Bend;Dressing;Stairs;Transfers    Examination-Participation Restrictions Meal Prep;Yard Work;Community Activity;Occupation;Driving;Shop    Stability/Clinical Decision Making Evolving/Moderate complexity    Rehab Potential Good    PT Frequency Other (comment)   1-2x   PT Duration 6 weeks    PT Treatment/Interventions Canalith Repostioning;Moist Heat;Traction;Ultrasound;Gait training;Stair training;Functional mobility training;Therapeutic activities;Therapeutic exercise;Balance training;Neuromuscular re-education;Patient/family education;Manual techniques;Passive range of motion;Dry needling;Vestibular;Visual/perceptual remediation/compensation;Spinal Manipulations;Joint Manipulations    PT Next Visit Plan progress oculomotor exercises, VOR, habituation, balance, neck ROM    Consulted and Agree with Plan of Care Patient             Patient will benefit from skilled therapeutic intervention in order to improve the following deficits and impairments:  Dizziness, Postural dysfunction, Increased muscle spasms, Pain, Decreased activity tolerance, Decreased range of motion, Decreased balance, Difficulty walking, Impaired vision/preception  Visit Diagnosis: Dizziness and giddiness  Abnormal posture  Cervicalgia  Difficulty in walking, not elsewhere classified     Problem List Patient Active Problem List   Diagnosis Date Noted   Nystagmus 01/22/2022   Hypertension 01/11/2022   Mastoiditis of both sides    Headache 12/31/2021   Vertigo    Sinusitis 12/30/2021   Hypertriglyceridemia 07/17/2021   Morbid obesity with BMI of 40.0-44.9, adult (HCC)  07/17/2021   Suicidal ideation 07/14/2021   Hyperlipidemia 01/13/2020   Inverted nipple 09/16/2019   Annual physical exam 09/09/2019   Acne 09/09/2019   Excessive ear wax, left 09/09/2019   Depression, recurrent (HCC) 09/09/2019   GAD (generalized anxiety disorder) 08/17/2019   ADD (attention deficit disorder) 07/24/2019   Binge eating disorder 07/24/2019   Vitamin D deficiency 06/11/2019   Elevated liver enzymes 06/11/2019   Prediabetes    Obesity (BMI 30-39.9)    Insomnia    Major depressive disorder, recurrent episode, in partial remission (HCC)     Anette Guarneri, PT, DPT 02/01/22 5:08 PM   [facility] [address] [place], [place], [address] Phone: [phone]   Fax:  [phone]  Name: Cory Gomez MRN: [id number] Date of Birth: 1981-05-28  [place] [facility] [address], [address] [place], [place], [address] Phone: [phone]   Fax:  [phone]  Physical Therapy Treatment  Patient Details  Name: Cory Gomez MRN: [id number] Date of Birth: April 05, 1981 Referring Provider (PT): Ramiro Harvest MD   Encounter Date: 02/01/2022   PT End of Session - 02/01/22 1705     Visit Number 3    Number of Visits 13    Date for PT Re-Evaluation 02/27/22    Authorization Type [facility]    Authorization - Visit Number 3    Authorization - Number of Visits 25    PT Start Time 1609    PT Stop Time 1659    PT Time Calculation (min) 50 min    Equipment Utilized During Treatment Gait belt    Activity Tolerance Patient tolerated treatment well;Patient limited by pain    Behavior During Therapy [facility] for tasks assessed/performed             Past Medical History:  Diagnosis Date   ADHD (attention deficit hyperactivity disorder)    Anxiety and depression    COVID-19    12/28/19   Insomnia    Obesity (BMI 30-39.9)    Prediabetes     Past Surgical History:  Procedure Laterality Date   NO PAST SURGERIES      There were no vitals filed for this visit.   Subjective Assessment - 02/01/22 1609     Subjective The dizziness has gotten slightly better, but still occurs. Following the pen exercise started to give me a HA.    Pertinent History D/C summary on 01/09/22 - Previously healthy 41 year old male presenting for dizziness and ataxia.  No central etiology was identified on CT or MRI imaging.  Patient was found to have bilateral mastoid effusions with associated mastoid tenderness.  Symptoms were not able to be relieved while in the ED.  IV antibiotics were initiated and patient was admitted for continued management of mastoiditis and observation    Diagnostic tests 12/30/21 brain MRI: No evidence of acute intracranial abnormality. Severe paranasal sinus disease, suggestive of sinusitis.Moderate  right and small left mastoid effusions.    Patient Stated Goals improve dizziness    Currently in Pain? No/denies                     Vestibular Assessment - 02/01/22 0001       Oculomotor Exam   Smooth Pursuits Saccades   worse to R, up, down   Saccades Slow;Undershoots   improved from last session     Oculomotor Exam-Fixation Suppressed    Left Head Impulse unable to tolerate d/t cervical tightness    Right Head Impulse unable to tolerate d/t cervical tightness      Vestibulo-Ocular Reflex   VOR 1 Head Only (x 1 viewing) trouble with fixation in B horizontal directions; c/o dizziness                      [facility] Adult PT Treatment/Exercise - 02/01/22 0001       Exercises   Exercises Neck      Neck Exercises: Seated   Cervical Rotation Right;Left;5 reps    Cervical Rotation Limitations SNAG to tolerance   c/o HA to the R side   Shoulder Rolls Backwards;Forwards;5 reps   trouble coordinating movement   Other Seated Exercise cervical extension SNAG 5x- limited by pain, discontinued      Neck Exercises: Stretches  [facility] [address], [address] [place], [place], [address] Phone: [phone]   Fax:  [phone]  Physical Therapy Treatment  Patient Details  Name: Cory Gomez MRN: [id number] Date of Birth: April 05, 1981 Referring Provider (PT): Ramiro Harvest MD   Encounter Date: 02/01/2022   PT End of Session - 02/01/22 1705     Visit Number 3    Number of Visits 13    Date for PT Re-Evaluation 02/27/22    Authorization Type [facility]    Authorization - Visit Number 3    Authorization - Number of Visits 25    PT Start Time 1609    PT Stop Time 1659    PT Time Calculation (min) 50 min    Equipment Utilized During Treatment Gait belt    Activity Tolerance Patient tolerated treatment well;Patient limited by pain    Behavior During Therapy [facility] for tasks assessed/performed             Past Medical History:  Diagnosis Date   ADHD (attention deficit hyperactivity disorder)    Anxiety and depression    COVID-19    12/28/19   Insomnia    Obesity (BMI 30-39.9)    Prediabetes     Past Surgical History:  Procedure Laterality Date   NO PAST SURGERIES      There were no vitals filed for this visit.   Subjective Assessment - 02/01/22 1609     Subjective The dizziness has gotten slightly better, but still occurs. Following the pen exercise started to give me a HA.    Pertinent History D/C summary on 01/09/22 - Previously healthy 41 year old male presenting for dizziness and ataxia.  No central etiology was identified on CT or MRI imaging.  Patient was found to have bilateral mastoid effusions with associated mastoid tenderness.  Symptoms were not able to be relieved while in the ED.  IV antibiotics were initiated and patient was admitted for continued management of mastoiditis and observation    Diagnostic tests 12/30/21 brain MRI: No evidence of acute intracranial abnormality. Severe paranasal sinus disease, suggestive of sinusitis.Moderate  right and small left mastoid effusions.    Patient Stated Goals improve dizziness    Currently in Pain? No/denies                     Vestibular Assessment - 02/01/22 0001       Oculomotor Exam   Smooth Pursuits Saccades   worse to R, up, down   Saccades Slow;Undershoots   improved from last session     Oculomotor Exam-Fixation Suppressed    Left Head Impulse unable to tolerate d/t cervical tightness    Right Head Impulse unable to tolerate d/t cervical tightness      Vestibulo-Ocular Reflex   VOR 1 Head Only (x 1 viewing) trouble with fixation in B horizontal directions; c/o dizziness                      [facility] Adult PT Treatment/Exercise - 02/01/22 0001       Exercises   Exercises Neck      Neck Exercises: Seated   Cervical Rotation Right;Left;5 reps    Cervical Rotation Limitations SNAG to tolerance   c/o HA to the R side   Shoulder Rolls Backwards;Forwards;5 reps   trouble coordinating movement   Other Seated Exercise cervical extension SNAG 5x- limited by pain, discontinued      Neck Exercises: Stretches

## 2022-02-01 NOTE — Patient Instructions (Signed)
Access Code: [email or web address] URL: [email or web address] Date: 02/01/2022 Prepared by: [facility]  Exercises Seated Horizontal Saccades - 1 x daily - 5 x weekly - 2 sets - 10 reps Seated Vertical Saccades - 1 x daily - 5 x weekly - 2 sets - 10 reps Seated Horizontal Smooth Pursuit - 1 x daily - 5 x weekly - 2 sets - 10 reps Brandt-Daroff Vestibular Exercise - 1 x daily - 5 x weekly - 2 sets - 5 reps Romberg Stance - 1 x daily - 5 x weekly - 2 sets - 30 sec hold Standing Balance with Eyes Closed - 1 x daily - 5 x weekly - 2 sets - 30 sec hold Seated Head Turns Vestibular Habituation - 1 x daily - 5 x weekly - 2 sets - 5 reps Seated Shoulder Circles - 1 x daily - 5 x weekly - 2 sets - 5 reps Heat - 1 x daily - 5 x weekly - 2 sets - 10-15 min hold

## 2022-02-02 NOTE — Telephone Encounter (Signed)
Filled out and faxed. Received confirmation that this has gone through

## 2022-02-03 ENCOUNTER — Other Ambulatory Visit (HOSPITAL_BASED_OUTPATIENT_CLINIC_OR_DEPARTMENT_OTHER): Payer: Self-pay

## 2022-02-05 ENCOUNTER — Other Ambulatory Visit (HOSPITAL_BASED_OUTPATIENT_CLINIC_OR_DEPARTMENT_OTHER): Payer: Self-pay

## 2022-02-06 ENCOUNTER — Encounter: Payer: 59 | Admitting: Physical Therapy

## 2022-02-07 ENCOUNTER — Other Ambulatory Visit (HOSPITAL_COMMUNITY): Payer: Self-pay

## 2022-02-08 ENCOUNTER — Encounter: Payer: Self-pay | Admitting: Physical Therapy

## 2022-02-08 ENCOUNTER — Telehealth: Payer: Self-pay | Admitting: Physical Therapy

## 2022-02-08 ENCOUNTER — Other Ambulatory Visit: Payer: Self-pay

## 2022-02-08 ENCOUNTER — Telehealth: Payer: Self-pay | Admitting: Internal Medicine

## 2022-02-08 ENCOUNTER — Ambulatory Visit: Payer: 59 | Admitting: Physical Therapy

## 2022-02-08 ENCOUNTER — Other Ambulatory Visit: Payer: Self-pay | Admitting: Internal Medicine

## 2022-02-08 DIAGNOSIS — R42 Dizziness and giddiness: Secondary | ICD-10-CM | POA: Diagnosis not present

## 2022-02-08 DIAGNOSIS — R262 Difficulty in walking, not elsewhere classified: Secondary | ICD-10-CM | POA: Diagnosis not present

## 2022-02-08 DIAGNOSIS — R293 Abnormal posture: Secondary | ICD-10-CM

## 2022-02-08 DIAGNOSIS — M542 Cervicalgia: Secondary | ICD-10-CM

## 2022-02-08 DIAGNOSIS — F900 Attention-deficit hyperactivity disorder, predominantly inattentive type: Secondary | ICD-10-CM

## 2022-02-08 NOTE — Therapy (Signed)
[facility] [address] [place], [place], [address] Phone: [phone]   Fax:  [phone]  Physical Therapy Treatment  Patient Details  Name: Cory Gomez MRN: [id number] Date of Birth: 1981/11/01 Referring Provider (PT): Ramiro Harvest MD   Encounter Date: 02/08/2022   PT End of Session - 02/08/22 1618     Visit Number 4    Number of Visits 13    Date for PT Re-Evaluation 02/27/22    Authorization Type [facility]    Authorization - Visit Number 4    Authorization - Number of Visits 25    PT Start Time 1528    PT Stop Time 1612    PT Time Calculation (min) 44 min    Equipment Utilized During Treatment Gait belt    Activity Tolerance Patient tolerated treatment well;Other (comment)   dizziness   Behavior During Therapy W[facility] for tasks assessed/performed             Past Medical History:  Diagnosis Date   ADHD (attention deficit hyperactivity disorder)    Anxiety and depression    COVID-19    12/28/19   Insomnia    Obesity (BMI 30-39.9)    Prediabetes     Past Surgical History:  Procedure Laterality Date   NO PAST SURGERIES      There were no vitals filed for this visit.   Subjective Assessment - 02/08/22 1521     Subjective THings have gotten better, however on Monday he walked to the bathroom and lost his balance. Fell head first and bumped the L side of the forehead. Noting increased dizziness and HAs. Denies LOC, increased confusion or memory loss, worsening imbalance, nausea/vomiting, vision changes, seizures, slurred speech, weakness. Notes new onset of R UE N/T and worsening photophobia and phonophobia since the fall.    Patient is accompained by: Family member   wife   Pertinent History D/C summary on 01/09/22 - Previously healthy 41 year old male presenting for dizziness and ataxia.  No central etiology was identified on CT or MRI imaging.  Patient was found to have bilateral mastoid effusions with  associated mastoid tenderness.  Symptoms were not able to be relieved while in the ED.  IV antibiotics were initiated and patient was admitted for continued management of mastoiditis and observation    Diagnostic tests 12/30/21 brain MRI: No evidence of acute intracranial abnormality. Severe paranasal sinus disease, suggestive of sinusitis.Moderate right and small left mastoid effusions.    Patient Stated Goals improve dizziness    Currently in Pain? Yes    Pain Score 2     Pain Location Head    Pain Orientation Left    Pain Descriptors / Indicators Headache    Pain Type Acute pain                               [facility] Adult PT Treatment/Exercise - 02/08/22 0001       Neck Exercises: Seated   Shoulder Rolls Backwards;Forwards;10 reps   cues for coordinating forward rolls   Other Seated Exercise cervical rotation with EC to focus on cervical stretch rather than dizziness 10x      Neck Exercises: Stretches   Upper Trapezius Stretch Right;Left;2 reps   15 sec with EC   Upper Trapezius Stretch Limitations limited by pain on R             Vestibular Treatment/Exercise -  02/08/22 0001       Vestibular Treatment/Exercise   Gaze Exercises --   sitting head turns to targets 6x each; increased saccades and decreased speed to R vs L; c/o 3/10 dizziness; sitting head nods to targets; increased saccades and slowness with head flexion vs. extension 5x; c/o 6/10 dizziness               Balance Exercises - 02/08/22 0001       Balance Exercises: Standing   Standing Eyes Opened 30 secs;Narrow base of support (BOS);Wide (BOA);Foam/compliant surface;Solid surface;1 rep    Standing Eyes Closed Wide (BOA);Narrow base of support (BOS);30 secs;1 rep    Sit to Stand Standard surface   3x maintaining gaze fixation; CGA-min A and cueing for anterior trunk lean; c/o 7/10 dizziness   Other Standing Exercises Comments wide stance + EC head turns 10x                PT  Education - 02/08/22 1618     Education Details advised patient to F/U with PCP about new onset of symptoms s/p fall    Person(s) Educated Patient;Spouse    Methods Explanation    Comprehension Verbalized understanding              PT Short Term Goals - 02/01/22 1706       PT SHORT TERM GOAL #1   Title Patient to be independent with initial HEP.    Time 3    Period Weeks    Status Achieved    Target Date 02/15/22               PT Long Term Goals - 02/01/22 1706       PT LONG TERM GOAL #1   Title Patient to be independent with advanced HEP.    Time 6    Period Weeks    Status On-going    Target Date 03/15/22      PT LONG TERM GOAL #2   Title Patient to perform bed mobility without dizziness limiting.    Time 6    Period Weeks    Status On-going    Target Date 03/08/22      PT LONG TERM GOAL #3   Title Patient to tolerate standing VOR x1 for 30 sec with 0/10 dizziness.    Time 6    Period Weeks    Status On-going    Target Date 03/08/22      PT LONG TERM GOAL #4   Title Patient to score at least 20/24 on DGI in order to decrease risk of falls.    Time 6    Period Weeks    Status On-going    Target Date 03/08/22      PT LONG TERM GOAL #5   Title Patient to report tolerance for driving in order to return back to work.    Time 6    Period Weeks    Status On-going    Target Date 03/08/22                   Plan - 02/08/22 1619     Clinical Impression Statement Patient arrived to session with report of experiencing a fall on Monday resulting in head trauma to the L side of the forehead. Reports increased dizziness, HAs, new onset of R UE N/T, and worsening photophobia and phonophobia s/p fall. Denies LOC, increased confusion or memory loss, worsening imbalance, nausea/vomiting, vision changes, seizures, slurred speech, weakness.  Worked on slow but progressive gaze stabilization activities with sit breaks in between to allow symptoms to settle.  Patient with limited tolerance for dizziness activities today, thus end of session focused on gentle cervical stretching with EC d/t c/o dizziness with EO. Patient agreeable to contact PCP for work-up s/p fall. No complaints at end of session.    Examination-Activity Limitations Stand;Locomotion Level;Bend;Dressing;Stairs;Transfers    Examination-Participation Restrictions Meal Prep;Yard Work;Community Activity;Occupation;Driving;Shop    Stability/Clinical Decision Making Evolving/Moderate complexity    Rehab Potential Good    PT Frequency Other (comment)   1-2x   PT Duration 6 weeks    PT Treatment/Interventions Canalith Repostioning;Moist Heat;Traction;Ultrasound;Gait training;Stair training;Functional mobility training;Therapeutic activities;Therapeutic exercise;Balance training;Neuromuscular re-education;Patient/family education;Manual techniques;Passive range of motion;Dry needling;Vestibular;Visual/perceptual remediation/compensation;Spinal Manipulations;Joint Manipulations    PT Next Visit Plan progress oculomotor exercises, VOR, habituation, balance, neck ROM    Consulted and Agree with Plan of Care Patient             Patient will benefit from skilled therapeutic intervention in order to improve the following deficits and impairments:  Dizziness, Postural dysfunction, Increased muscle spasms, Pain, Decreased activity tolerance, Decreased range of motion, Decreased balance, Difficulty walking, Impaired vision/preception  Visit Diagnosis: Dizziness and giddiness  Abnormal posture  Cervicalgia  Difficulty in walking, not elsewhere classified     Problem List Patient Active Problem List   Diagnosis Date Noted   Nystagmus 01/22/2022   Hypertension 01/11/2022   Mastoiditis of both sides    Headache 12/31/2021   Vertigo    Sinusitis 12/30/2021   Hypertriglyceridemia 07/17/2021   Morbid obesity with BMI of 40.0-44.9, adult (HCC) 07/17/2021   Suicidal ideation 07/14/2021    Hyperlipidemia 01/13/2020   Inverted nipple 09/16/2019   Annual physical exam 09/09/2019   Acne 09/09/2019   Excessive ear wax, left 09/09/2019   Depression, recurrent (HCC) 09/09/2019   GAD (generalized anxiety disorder) 08/17/2019   ADD (attention deficit disorder) 07/24/2019   Binge eating disorder 07/24/2019   Vitamin D deficiency 06/11/2019   Elevated liver enzymes 06/11/2019   Prediabetes    Obesity (BMI 30-39.9)    Insomnia    Major depressive disorder, recurrent episode, in partial remission (HCC)     Anette Guarneri, PT, DPT 02/08/22 4:31 PM   [facility] [address] [place], [place], [address] Phone: [phone]   Fax:  [phone]  Name: Cory Gomez MRN: [id number] Date of Birth: 1981/06/03

## 2022-02-08 NOTE — Telephone Encounter (Signed)
Pt called in stating that his physical therapist said that he may have suffered a mild concussion from falling down at his house and bumping his head. Pt states that he does have a bump on his head. Sent to access nurse

## 2022-02-08 NOTE — Telephone Encounter (Signed)
Hi Dr. Terese Door,  I saw Cory Gomez today and he reports that he sustained a fall on Monday resulting in head trauma to the L side of the forehead. Reports increased dizziness, HAs, new onset of R UE N/T, and worsening photophobia and phonophobia s/p fall. Denies LOC, increased confusion or memory loss, worsening imbalance, nausea/vomiting, vision changes, seizures, slurred speech, weakness. I advised him to f/u with you for work-up following these new symptoms. Please advise if any changes need to be made to PT POC.  Thanks,  Janene Harvey, PT, DPT 02/08/22 4:41 PM

## 2022-02-09 ENCOUNTER — Telehealth: Payer: Self-pay | Admitting: Internal Medicine

## 2022-02-09 ENCOUNTER — Other Ambulatory Visit (HOSPITAL_COMMUNITY): Payer: Self-pay

## 2022-02-09 NOTE — Telephone Encounter (Signed)
Pt needs to establish with psychiatry for adderall and other mental health medications last fill he should have been informed of this and referral placed Dr. Nicolasa Ducking in the past which he missed appt  He will need to call her office and establish for refills on adderall and all mental health medications

## 2022-02-09 NOTE — Telephone Encounter (Signed)
Rec pt go to ED for fall with head trauma further ?s about dizziness and imbalance need to be directed to specialty Neurology and ENT  Has he seen [facility] in [place] yet?  What has his bp been running ?

## 2022-02-12 DIAGNOSIS — J343 Hypertrophy of nasal turbinates: Secondary | ICD-10-CM | POA: Diagnosis not present

## 2022-02-12 DIAGNOSIS — J342 Deviated nasal septum: Secondary | ICD-10-CM | POA: Diagnosis not present

## 2022-02-12 DIAGNOSIS — R42 Dizziness and giddiness: Secondary | ICD-10-CM | POA: Diagnosis not present

## 2022-02-12 DIAGNOSIS — H6122 Impacted cerumen, left ear: Secondary | ICD-10-CM | POA: Diagnosis not present

## 2022-02-12 DIAGNOSIS — Z8709 Personal history of other diseases of the respiratory system: Secondary | ICD-10-CM | POA: Diagnosis not present

## 2022-02-12 NOTE — Telephone Encounter (Signed)
Left message to return call 

## 2022-02-12 NOTE — Telephone Encounter (Signed)
Cory Gomez routed conversation to You 4 days ago   Cory Gomez 4 days ago   AD Pt called in stating that his physical therapist said that he may have suffered a mild concussion from falling down at his house and bumping his head. Pt states that he does have a bump on his head. Sent to access nurse

## 2022-02-12 NOTE — Telephone Encounter (Signed)
Cory Gomez, Cory Spillers, MD 3 days ago   TM Pt needs to establish with psychiatry for adderall and other mental health medications last fill he should have been informed of this and referral placed Dr. Maryruth Bun in the past which he missed appt  He will need to call her office and establish for refills on adderall and all mental health medications

## 2022-02-12 NOTE — Telephone Encounter (Signed)
See 02/08/22 telephone encounter

## 2022-02-12 NOTE — Telephone Encounter (Signed)
Form for work accomodation dropped off at the front desk for Patient.   Please advise are you agreeing to complete this paperwork?

## 2022-02-13 ENCOUNTER — Telehealth: Payer: Self-pay | Admitting: Physical Therapy

## 2022-02-13 ENCOUNTER — Encounter: Payer: 59 | Admitting: Physical Therapy

## 2022-02-13 NOTE — Telephone Encounter (Signed)
Contacted [name] to notify him of PCP's instruction to present to ED for further work up d/t recent head trauma and new symptoms. Patient reported understanding.

## 2022-02-13 NOTE — Telephone Encounter (Signed)
Work Engineer, materials for dizziness or further work up for [employer] and paperwork I am asking neurology or [facility] to fill out for patient as from a primary care standpoint now pt must see specialty to try to improve symptoms if not related to HTN  Inform pt    Does he have his ENT appt?

## 2022-02-15 ENCOUNTER — Encounter: Payer: Self-pay | Admitting: Physical Therapy

## 2022-02-15 ENCOUNTER — Other Ambulatory Visit: Payer: Self-pay

## 2022-02-15 ENCOUNTER — Ambulatory Visit: Payer: 59 | Admitting: Physical Therapy

## 2022-02-15 DIAGNOSIS — R293 Abnormal posture: Secondary | ICD-10-CM

## 2022-02-15 DIAGNOSIS — M542 Cervicalgia: Secondary | ICD-10-CM | POA: Diagnosis not present

## 2022-02-15 DIAGNOSIS — R42 Dizziness and giddiness: Secondary | ICD-10-CM

## 2022-02-15 DIAGNOSIS — R262 Difficulty in walking, not elsewhere classified: Secondary | ICD-10-CM

## 2022-02-15 NOTE — Therapy (Signed)
[facility] [address] [place], [place], [address] Phone: [phone]   Fax:  [phone]  Physical Therapy Treatment  Patient Details  Name: Cory Gomez MRN: [id number] Date of Birth: 12/13/1981 Referring Provider (PT): Irine Seal MD   Encounter Date: 02/15/2022   PT End of Session - 02/15/22 1636     Visit Number 5    Number of Visits 13    Date for PT Re-Evaluation 02/27/22    Authorization Type [facility]    Authorization - Visit Number 5    Authorization - Number of Visits 25    PT Start Time [id number]   pt late   PT Stop Time 1631    PT Time Calculation (min) 43 min    Equipment Utilized During Treatment Gait belt    Activity Tolerance Patient tolerated treatment well    Behavior During Therapy [facility] for tasks assessed/performed             Past Medical History:  Diagnosis Date   ADHD (attention deficit hyperactivity disorder)    Anxiety and depression    COVID-19    12/28/19   Insomnia    Obesity (BMI 30-39.9)    Prediabetes     Past Surgical History:  Procedure Laterality Date   NO PAST SURGERIES      There were no vitals filed for this visit.   Subjective Assessment - 02/15/22 1549     Subjective Things have improved a lot. Went to the ENT at [place] and got both ears cleaned out. Also notes that L sinuses were irritated. Scheduled for vertigo specialist on 03/20/22. Went to the ED, but it was an 11 hour wait so he left.    Pertinent History D/C summary on 01/09/22 - Previously healthy 41 year old male presenting for dizziness and ataxia.  No central etiology was identified on CT or MRI imaging.  Patient was found to have bilateral mastoid effusions with associated mastoid tenderness.  Symptoms were not able to be relieved while in the ED.  IV antibiotics were initiated and patient was admitted for continued management of mastoiditis and observation    Diagnostic tests 12/30/21 brain MRI: No evidence  of acute intracranial abnormality. Severe paranasal sinus disease, suggestive of sinusitis.Moderate right and small left mastoid effusions.    Patient Stated Goals improve dizziness    Currently in Pain? No/denies                               [name] Adult PT Treatment/Exercise - 02/15/22 0001       Neuro Re-ed    Neuro Re-ed Details  R/L gaze stabilization + prop on elbow 5x each; 2 sets   slow; no dizziness but encouraged to pick up pace            Vestibular Treatment/Exercise - 02/15/22 0001       Vestibular Treatment/Exercise   Gaze Exercises X1 Viewing Horizontal;X1 Viewing Vertical;Comment      X1 Viewing Horizontal   Foot Position sitting, standing    Reps --   10   Comments head turns to targets; no dizziness      X1 Viewing Vertical   Foot Position sitting, standing    Reps --   10   Comments head nods to targets; no dizziness      Eye/Head Exercise Vertical   Comment sitting horizontal and vertical VOR 5x very slow;  Next Visit Plan progress oculomotor exercises, VOR, habituation, balance, neck ROM    Consulted and Agree with Plan of Care Patient             Patient will benefit from skilled therapeutic intervention in order to improve the following deficits and impairments:  Dizziness, Postural dysfunction, Increased muscle spasms, Pain, Decreased activity tolerance, Decreased range of motion, Decreased balance, Difficulty walking, Impaired vision/preception  Visit Diagnosis: Dizziness and giddiness  Abnormal posture  Cervicalgia  Difficulty in walking, not elsewhere classified     Problem List Patient Active Problem List   Diagnosis Date Noted   Nystagmus 01/22/2022   Hypertension 01/11/2022   Mastoiditis of both sides    Headache 12/31/2021   Vertigo    Sinusitis 12/30/2021   Hypertriglyceridemia 07/17/2021   Morbid obesity with BMI of 40.0-44.9, adult ([place]) 07/17/2021   Suicidal ideation 07/14/2021   Hyperlipidemia 01/13/2020   Inverted nipple 09/16/2019   Annual physical exam 09/09/2019   Acne 09/09/2019   Excessive ear wax, left 09/09/2019   Depression, recurrent ([place]) 09/09/2019   GAD (generalized anxiety disorder) 08/17/2019   ADD (attention deficit disorder) 07/24/2019   Binge eating disorder 07/24/2019   Vitamin D deficiency 06/11/2019   Elevated liver enzymes 06/11/2019   Prediabetes    Obesity (BMI 30-39.9)    Insomnia    Major depressive disorder, recurrent episode, in partial remission ([place])     Janene Harvey, PT, DPT 02/15/22 4:38 PM   [facility] [address] [place], [place], [address] Phone: [phone]   Fax:  [phone]  Name: Cory Gomez MRN: [id number] Date of Birth: [birth year]  [facility] [address] [place], [place], [address] Phone: [phone]   Fax:  [phone]  Physical Therapy Treatment  Patient Details  Name: Cory Gomez MRN: [id number] Date of Birth: 12/13/1981 Referring Provider (PT): Irine Seal MD   Encounter Date: 02/15/2022   PT End of Session - 02/15/22 1636     Visit Number 5    Number of Visits 13    Date for PT Re-Evaluation 02/27/22    Authorization Type [facility]    Authorization - Visit Number 5    Authorization - Number of Visits 25    PT Start Time [id number]   pt late   PT Stop Time 1631    PT Time Calculation (min) 43 min    Equipment Utilized During Treatment Gait belt    Activity Tolerance Patient tolerated treatment well    Behavior During Therapy [facility] for tasks assessed/performed             Past Medical History:  Diagnosis Date   ADHD (attention deficit hyperactivity disorder)    Anxiety and depression    COVID-19    12/28/19   Insomnia    Obesity (BMI 30-39.9)    Prediabetes     Past Surgical History:  Procedure Laterality Date   NO PAST SURGERIES      There were no vitals filed for this visit.   Subjective Assessment - 02/15/22 1549     Subjective Things have improved a lot. Went to the ENT at [place] and got both ears cleaned out. Also notes that L sinuses were irritated. Scheduled for vertigo specialist on 03/20/22. Went to the ED, but it was an 11 hour wait so he left.    Pertinent History D/C summary on 01/09/22 - Previously healthy 41 year old male presenting for dizziness and ataxia.  No central etiology was identified on CT or MRI imaging.  Patient was found to have bilateral mastoid effusions with associated mastoid tenderness.  Symptoms were not able to be relieved while in the ED.  IV antibiotics were initiated and patient was admitted for continued management of mastoiditis and observation    Diagnostic tests 12/30/21 brain MRI: No evidence  of acute intracranial abnormality. Severe paranasal sinus disease, suggestive of sinusitis.Moderate right and small left mastoid effusions.    Patient Stated Goals improve dizziness    Currently in Pain? No/denies                               [name] Adult PT Treatment/Exercise - 02/15/22 0001       Neuro Re-ed    Neuro Re-ed Details  R/L gaze stabilization + prop on elbow 5x each; 2 sets   slow; no dizziness but encouraged to pick up pace            Vestibular Treatment/Exercise - 02/15/22 0001       Vestibular Treatment/Exercise   Gaze Exercises X1 Viewing Horizontal;X1 Viewing Vertical;Comment      X1 Viewing Horizontal   Foot Position sitting, standing    Reps --   10   Comments head turns to targets; no dizziness      X1 Viewing Vertical   Foot Position sitting, standing    Reps --   10   Comments head nods to targets; no dizziness      Eye/Head Exercise Vertical   Comment sitting horizontal and vertical VOR 5x very slow;

## 2022-02-16 ENCOUNTER — Other Ambulatory Visit (HOSPITAL_COMMUNITY): Payer: Self-pay

## 2022-02-16 NOTE — Telephone Encounter (Signed)
No answer or voice mail

## 2022-02-20 ENCOUNTER — Ambulatory Visit: Payer: 59 | Admitting: Internal Medicine

## 2022-02-21 ENCOUNTER — Other Ambulatory Visit: Payer: Self-pay

## 2022-02-21 ENCOUNTER — Ambulatory Visit: Payer: 59 | Attending: Internal Medicine | Admitting: Physical Therapy

## 2022-02-21 DIAGNOSIS — R262 Difficulty in walking, not elsewhere classified: Secondary | ICD-10-CM | POA: Insufficient documentation

## 2022-02-21 DIAGNOSIS — R42 Dizziness and giddiness: Secondary | ICD-10-CM | POA: Diagnosis not present

## 2022-02-21 DIAGNOSIS — R293 Abnormal posture: Secondary | ICD-10-CM | POA: Diagnosis not present

## 2022-02-21 DIAGNOSIS — M542 Cervicalgia: Secondary | ICD-10-CM | POA: Insufficient documentation

## 2022-02-21 NOTE — Therapy (Deleted)
returned   Target Date 04/04/22                   Plan - 02/21/22 1714     Clinical Impression Statement Patient arrived to session with report of 65% improvement since initial  eval. Still reports trouble with focusing his eyes quickly and still notes some imbalance. No longer having light sensitivity. Denies questions/concerns on HEP. Patient scored 19/24 on DGI, indicating an increased risk of falls. Did demonstrated LOB x2 when descending stairs d/t poor depth perception. Able to complete VOR for 30 sec today however with slow speed and limited gaze stability to L. Patient has not returned to driving d/t poor visual fixation at this time. Upon simulating bed mobility, patient with c/o more significant dizziness in supine and R>L sidelying. Reports that he sleeps reclined every night, which likely contributes to feelings of dizziness. R DH was assessed which showed R upbeating torsional nystagmus which was treated with R Epley. Patient tolerated tx well and only with mild imbalance upon standing. No further complaints at end of session. Patient is progressing well towards goals. Would benefit from additional skilled PT services 2x/week for 6 weeks to address remaining goals.    Examination-Activity Limitations Stand;Locomotion Level;Bend;Dressing;Stairs;Transfers    Examination-Participation Restrictions Meal Prep;Yard Work;Community Activity;Occupation;Driving;Shop    Stability/Clinical Decision Making Evolving/Moderate complexity    Rehab Potential Good    PT Frequency Other (comment)   1-2x   PT Duration 6 weeks    PT Treatment/Interventions Canalith Repostioning;Moist Heat;Traction;Ultrasound;Gait training;Stair training;Functional mobility training;Therapeutic activities;Therapeutic exercise;Balance training;Neuromuscular re-education;Patient/family education;Manual techniques;Passive range of motion;Dry needling;Vestibular;Visual/perceptual remediation/compensation;Spinal Manipulations;Joint Manipulations    PT Next Visit Plan reassess R DH, progress VOR, habituation, balance, neck ROM    Consulted and Agree with Plan of Care Patient             Patient will  benefit from skilled therapeutic intervention in order to improve the following deficits and impairments:  Dizziness, Postural dysfunction, Increased muscle spasms, Pain, Decreased activity tolerance, Decreased range of motion, Decreased balance, Difficulty walking, Impaired vision/preception  Visit Diagnosis: Dizziness and giddiness  Abnormal posture  Cervicalgia  Difficulty in walking, not elsewhere classified     Problem List Patient Active Problem List   Diagnosis Date Noted   Nystagmus 01/22/2022   Hypertension 01/11/2022   Mastoiditis of both sides    Headache 12/31/2021   Vertigo    Sinusitis 12/30/2021   Hypertriglyceridemia 07/17/2021   Morbid obesity with BMI of 40.0-44.9, adult ([place]) 07/17/2021   Suicidal ideation 07/14/2021   Hyperlipidemia 01/13/2020   Inverted nipple 09/16/2019   Annual physical exam 09/09/2019   Acne 09/09/2019   Excessive ear wax, left 09/09/2019   Depression, recurrent ([place]) 09/09/2019   GAD (generalized anxiety disorder) 08/17/2019   ADD (attention deficit disorder) 07/24/2019   Binge eating disorder 07/24/2019   Vitamin D deficiency 06/11/2019   Elevated liver enzymes 06/11/2019   Prediabetes    Obesity (BMI 30-39.9)    Insomnia    Major depressive disorder, recurrent episode, in partial remission ([place])     [name], PT 02/21/2022, 5:20 PM  [facility] [address], [place], [place], [address] Phone: [phone]   Fax:  [phone]  Name: Cory Gomez MRN: [id number] Date of Birth: 08-24-1981  [facility] [address] [place], [place], [address] Phone: [phone]   Fax:  [phone]  Physical Therapy Treatment  Patient Details  Name: [name] MRN: [id number] Date of Birth: 02-16-1981 Referring Provider (PT): Irine Seal MD   Encounter Date: 02/21/2022   PT End of Session - 02/21/22 1714     Visit Number 6    Number of Visits 13    Date for PT Re-Evaluation 02/27/22    Authorization Type [facility]    Authorization - Visit Number 6    Authorization - Number of Visits 25    PT Start Time 4259    PT Stop Time 1702    PT Time Calculation (min) 49 min    Equipment Utilized During Treatment Gait belt    Activity Tolerance Patient tolerated treatment well    Behavior During Therapy [facility] for tasks assessed/performed             Past Medical History:  Diagnosis Date   ADHD (attention deficit hyperactivity disorder)    Anxiety and depression    COVID-19    12/28/19   Insomnia    Obesity (BMI 30-39.9)    Prediabetes     Past Surgical History:  Procedure Laterality Date   NO PAST SURGERIES      There were no vitals filed for this visit.   Subjective Assessment - 02/21/22 1613     Subjective Doing fine. Overall feeling better. Reports 65% improvement since initial eval. Still reports trouble with focusing his eyes quickly and some imblance. No longer having light sensitivity. Denies questions/concerns on HEP.    Pertinent History D/C summary on 01/09/22 - Previously healthy 41 year old male presenting for dizziness and ataxia.  No central etiology was identified on CT or MRI imaging.  Patient was found to have bilateral mastoid effusions with associated mastoid tenderness.  Symptoms were not able to be relieved while in the ED.  IV antibiotics were initiated and patient was admitted for continued management of mastoiditis and observation    Diagnostic tests 12/30/21 brain MRI: No evidence of acute  intracranial abnormality. Severe paranasal sinus disease, suggestive of sinusitis.Moderate right and small left mastoid effusions.    Patient Stated Goals improve dizziness    Currently in Pain? No/denies                [facility] PT Assessment - 02/21/22 1618       Assessment   Medical Diagnosis J01.40 (ICD-10-CM) - Acute pansinusitis, recurrence not specified    Referring Provider (PT) Irine Seal MD    Onset Date/Surgical Date 12/26/21      Standardized Balance Assessment   Standardized Balance Assessment Dynamic Gait Index      Dynamic Gait Index   Level Surface Mild Impairment    Change in Gait Speed Mild Impairment    Gait with Horizontal Head Turns Normal    Gait with Vertical Head Turns Normal    Gait and Pivot Turn Normal    Step Over Obstacle Normal    Step Around Obstacles Normal    Steps Severe Impairment   missing step d/t poor depth perception requirin min A   Total Score 19                 Vestibular Assessment - 02/21/22 0001       Dix-Hallpike Right   Dix-Hallpike Right Duration 1 min    Dix-Hallpike Right Symptoms Upbeat, right rotatory nystagmus  [facility] [address] [place], [place], [address] Phone: [phone]   Fax:  [phone]  Physical Therapy Treatment  Patient Details  Name: [name] MRN: [id number] Date of Birth: 02-16-1981 Referring Provider (PT): Irine Seal MD   Encounter Date: 02/21/2022   PT End of Session - 02/21/22 1714     Visit Number 6    Number of Visits 13    Date for PT Re-Evaluation 02/27/22    Authorization Type [facility]    Authorization - Visit Number 6    Authorization - Number of Visits 25    PT Start Time 4259    PT Stop Time 1702    PT Time Calculation (min) 49 min    Equipment Utilized During Treatment Gait belt    Activity Tolerance Patient tolerated treatment well    Behavior During Therapy [facility] for tasks assessed/performed             Past Medical History:  Diagnosis Date   ADHD (attention deficit hyperactivity disorder)    Anxiety and depression    COVID-19    12/28/19   Insomnia    Obesity (BMI 30-39.9)    Prediabetes     Past Surgical History:  Procedure Laterality Date   NO PAST SURGERIES      There were no vitals filed for this visit.   Subjective Assessment - 02/21/22 1613     Subjective Doing fine. Overall feeling better. Reports 65% improvement since initial eval. Still reports trouble with focusing his eyes quickly and some imblance. No longer having light sensitivity. Denies questions/concerns on HEP.    Pertinent History D/C summary on 01/09/22 - Previously healthy 41 year old male presenting for dizziness and ataxia.  No central etiology was identified on CT or MRI imaging.  Patient was found to have bilateral mastoid effusions with associated mastoid tenderness.  Symptoms were not able to be relieved while in the ED.  IV antibiotics were initiated and patient was admitted for continued management of mastoiditis and observation    Diagnostic tests 12/30/21 brain MRI: No evidence of acute  intracranial abnormality. Severe paranasal sinus disease, suggestive of sinusitis.Moderate right and small left mastoid effusions.    Patient Stated Goals improve dizziness    Currently in Pain? No/denies                [facility] PT Assessment - 02/21/22 1618       Assessment   Medical Diagnosis J01.40 (ICD-10-CM) - Acute pansinusitis, recurrence not specified    Referring Provider (PT) Irine Seal MD    Onset Date/Surgical Date 12/26/21      Standardized Balance Assessment   Standardized Balance Assessment Dynamic Gait Index      Dynamic Gait Index   Level Surface Mild Impairment    Change in Gait Speed Mild Impairment    Gait with Horizontal Head Turns Normal    Gait with Vertical Head Turns Normal    Gait and Pivot Turn Normal    Step Over Obstacle Normal    Step Around Obstacles Normal    Steps Severe Impairment   missing step d/t poor depth perception requirin min A   Total Score 19                 Vestibular Assessment - 02/21/22 0001       Dix-Hallpike Right   Dix-Hallpike Right Duration 1 min    Dix-Hallpike Right Symptoms Upbeat, right rotatory nystagmus

## 2022-02-21 NOTE — Therapy (Signed)
[facility] [address] [place], [place], [address] Phone: [phone]   Fax:  [phone]  Physical Therapy Treatment  Patient Details  Name: Cory Gomez MRN: [id number] Date of Birth: [birth year] Referring Provider (PT): Irine Seal MD   Encounter Date: 02/21/2022   PT End of Session - 02/21/22 1714     Visit Number 6    Number of Visits 18    Date for PT Re-Evaluation 04/04/22    Authorization Type [facility]    Authorization - Visit Number 6    Authorization - Number of Visits 25    PT Start Time 8546    PT Stop Time 1702    PT Time Calculation (min) 49 min    Equipment Utilized During Treatment Gait belt    Activity Tolerance Patient tolerated treatment well    Behavior During Therapy [facility] for tasks assessed/performed             Past Medical History:  Diagnosis Date   ADHD (attention deficit hyperactivity disorder)    Anxiety and depression    COVID-19    12/28/19   Insomnia    Obesity (BMI 30-39.9)    Prediabetes     Past Surgical History:  Procedure Laterality Date   NO PAST SURGERIES      There were no vitals filed for this visit.   Subjective Assessment - 02/21/22 1613     Subjective Doing fine. Overall feeling better. Reports 65% improvement since initial eval. Still reports trouble with focusing his eyes quickly and some imblance. No longer having light sensitivity. Denies questions/concerns on HEP.    Pertinent History D/C summary on 01/09/22 - Previously healthy 41 year old male presenting for dizziness and ataxia.  No central etiology was identified on CT or MRI imaging.  Patient was found to have bilateral mastoid effusions with associated mastoid tenderness.  Symptoms were not able to be relieved while in the ED.  IV antibiotics were initiated and patient was admitted for continued management of mastoiditis and observation    Diagnostic tests 12/30/21 brain MRI: No evidence of acute  intracranial abnormality. Severe paranasal sinus disease, suggestive of sinusitis.Moderate right and small left mastoid effusions.    Patient Stated Goals improve dizziness    Currently in Pain? No/denies                [facility] PT Assessment - 02/21/22 1618       Assessment   Medical Diagnosis J01.40 (ICD-10-CM) - Acute pansinusitis, recurrence not specified    Referring Provider (PT) Irine Seal MD    Onset Date/Surgical Date 12/26/21      Standardized Balance Assessment   Standardized Balance Assessment Dynamic Gait Index      Dynamic Gait Index   Level Surface Mild Impairment    Change in Gait Speed Mild Impairment    Gait with Horizontal Head Turns Normal    Gait with Vertical Head Turns Normal    Gait and Pivot Turn Normal    Step Over Obstacle Normal    Step Around Obstacles Normal    Steps Severe Impairment   missing step d/t poor depth perception requirin min A   Total Score 19                 Vestibular Assessment - 02/21/22 0001       Dix-Hallpike Right   Dix-Hallpike Right Duration 1 min    Dix-Hallpike Right Symptoms Upbeat, right rotatory nystagmus  returned   Target Date 04/04/22                   Plan - 02/21/22 1714     Clinical Impression Statement Patient arrived to session with report of 65% improvement since initial  eval. Still reports trouble with focusing his eyes quickly and still notes some imbalance. No longer having light sensitivity. Denies questions/concerns on HEP. Patient scored 19/24 on DGI, indicating an increased risk of falls. Did demonstrated LOB x2 when descending stairs d/t poor depth perception. Able to complete VOR for 30 sec today however with slow speed and limited gaze stability to L. Patient has not returned to driving d/t poor visual fixation at this time. Upon simulating bed mobility, patient with c/o more significant dizziness in supine and R>L sidelying. Reports that he sleeps reclined every night, which likely contributes to feelings of dizziness. R DH was assessed which showed R upbeating torsional nystagmus which was treated with R Epley. Patient tolerated tx well and only with mild imbalance upon standing. No further complaints at end of session. Patient is progressing well towards goals. Would benefit from additional skilled PT services 2x/week for 6 weeks to address remaining goals.    Examination-Activity Limitations Stand;Locomotion Level;Bend;Dressing;Stairs;Transfers    Examination-Participation Restrictions Meal Prep;Yard Work;Community Activity;Occupation;Driving;Shop    Stability/Clinical Decision Making Evolving/Moderate complexity    Rehab Potential Good    PT Frequency Other (comment)   1-2x   PT Duration 6 weeks    PT Treatment/Interventions Canalith Repostioning;Moist Heat;Traction;Ultrasound;Gait training;Stair training;Functional mobility training;Therapeutic activities;Therapeutic exercise;Balance training;Neuromuscular re-education;Patient/family education;Manual techniques;Passive range of motion;Dry needling;Vestibular;Visual/perceptual remediation/compensation;Spinal Manipulations;Joint Manipulations    PT Next Visit Plan reassess R DH, progress VOR, habituation, balance, neck ROM    Consulted and Agree with Plan of Care Patient             Patient will  benefit from skilled therapeutic intervention in order to improve the following deficits and impairments:  Dizziness, Postural dysfunction, Increased muscle spasms, Pain, Decreased activity tolerance, Decreased range of motion, Decreased balance, Difficulty walking, Impaired vision/preception  Visit Diagnosis: Dizziness and giddiness  Abnormal posture  Cervicalgia  Difficulty in walking, not elsewhere classified     Problem List Patient Active Problem List   Diagnosis Date Noted   Nystagmus 01/22/2022   Hypertension 01/11/2022   Mastoiditis of both sides    Headache 12/31/2021   Vertigo    Sinusitis 12/30/2021   Hypertriglyceridemia 07/17/2021   Morbid obesity with BMI of 40.0-44.9, adult ([place]) 07/17/2021   Suicidal ideation 07/14/2021   Hyperlipidemia 01/13/2020   Inverted nipple 09/16/2019   Annual physical exam 09/09/2019   Acne 09/09/2019   Excessive ear wax, left 09/09/2019   Depression, recurrent (Why) 09/09/2019   GAD (generalized anxiety disorder) 08/17/2019   ADD (attention deficit disorder) 07/24/2019   Binge eating disorder 07/24/2019   Vitamin D deficiency 06/11/2019   Elevated liver enzymes 06/11/2019   Prediabetes    Obesity (BMI 30-39.9)    Insomnia    Major depressive disorder, recurrent episode, in partial remission ([place])     Janene Harvey, PT, DPT 02/21/22 5:22 PM   [facility] [address] [place], [place], [address] Phone: [phone]   Fax:  [phone]  Name: Brevon Dewald MRN: [id number] Date of Birth: 02/03/1981  returned   Target Date 04/04/22                   Plan - 02/21/22 1714     Clinical Impression Statement Patient arrived to session with report of 65% improvement since initial  eval. Still reports trouble with focusing his eyes quickly and still notes some imbalance. No longer having light sensitivity. Denies questions/concerns on HEP. Patient scored 19/24 on DGI, indicating an increased risk of falls. Did demonstrated LOB x2 when descending stairs d/t poor depth perception. Able to complete VOR for 30 sec today however with slow speed and limited gaze stability to L. Patient has not returned to driving d/t poor visual fixation at this time. Upon simulating bed mobility, patient with c/o more significant dizziness in supine and R>L sidelying. Reports that he sleeps reclined every night, which likely contributes to feelings of dizziness. R DH was assessed which showed R upbeating torsional nystagmus which was treated with R Epley. Patient tolerated tx well and only with mild imbalance upon standing. No further complaints at end of session. Patient is progressing well towards goals. Would benefit from additional skilled PT services 2x/week for 6 weeks to address remaining goals.    Examination-Activity Limitations Stand;Locomotion Level;Bend;Dressing;Stairs;Transfers    Examination-Participation Restrictions Meal Prep;Yard Work;Community Activity;Occupation;Driving;Shop    Stability/Clinical Decision Making Evolving/Moderate complexity    Rehab Potential Good    PT Frequency Other (comment)   1-2x   PT Duration 6 weeks    PT Treatment/Interventions Canalith Repostioning;Moist Heat;Traction;Ultrasound;Gait training;Stair training;Functional mobility training;Therapeutic activities;Therapeutic exercise;Balance training;Neuromuscular re-education;Patient/family education;Manual techniques;Passive range of motion;Dry needling;Vestibular;Visual/perceptual remediation/compensation;Spinal Manipulations;Joint Manipulations    PT Next Visit Plan reassess R DH, progress VOR, habituation, balance, neck ROM    Consulted and Agree with Plan of Care Patient             Patient will  benefit from skilled therapeutic intervention in order to improve the following deficits and impairments:  Dizziness, Postural dysfunction, Increased muscle spasms, Pain, Decreased activity tolerance, Decreased range of motion, Decreased balance, Difficulty walking, Impaired vision/preception  Visit Diagnosis: Dizziness and giddiness  Abnormal posture  Cervicalgia  Difficulty in walking, not elsewhere classified     Problem List Patient Active Problem List   Diagnosis Date Noted   Nystagmus 01/22/2022   Hypertension 01/11/2022   Mastoiditis of both sides    Headache 12/31/2021   Vertigo    Sinusitis 12/30/2021   Hypertriglyceridemia 07/17/2021   Morbid obesity with BMI of 40.0-44.9, adult ([place]) 07/17/2021   Suicidal ideation 07/14/2021   Hyperlipidemia 01/13/2020   Inverted nipple 09/16/2019   Annual physical exam 09/09/2019   Acne 09/09/2019   Excessive ear wax, left 09/09/2019   Depression, recurrent (Why) 09/09/2019   GAD (generalized anxiety disorder) 08/17/2019   ADD (attention deficit disorder) 07/24/2019   Binge eating disorder 07/24/2019   Vitamin D deficiency 06/11/2019   Elevated liver enzymes 06/11/2019   Prediabetes    Obesity (BMI 30-39.9)    Insomnia    Major depressive disorder, recurrent episode, in partial remission ([place])     Janene Harvey, PT, DPT 02/21/22 5:22 PM   [facility] [address] [place], [place], [address] Phone: [phone]   Fax:  [phone]  Name: Brevon Dewald MRN: [id number] Date of Birth: 02/03/1981

## 2022-02-21 NOTE — Telephone Encounter (Signed)
Called and informed the Patient. He will have the specialty fill this form out. He ask that the form be mailed back to him. Verified his address and mailed the form.  ?

## 2022-02-26 ENCOUNTER — Ambulatory Visit: Payer: 59 | Admitting: Physical Therapy

## 2022-02-26 DIAGNOSIS — R42 Dizziness and giddiness: Secondary | ICD-10-CM | POA: Diagnosis not present

## 2022-02-28 ENCOUNTER — Encounter: Payer: Self-pay | Admitting: Physical Therapy

## 2022-02-28 ENCOUNTER — Other Ambulatory Visit: Payer: Self-pay

## 2022-02-28 ENCOUNTER — Ambulatory Visit: Payer: 59 | Admitting: Physical Therapy

## 2022-02-28 DIAGNOSIS — M542 Cervicalgia: Secondary | ICD-10-CM

## 2022-02-28 DIAGNOSIS — R293 Abnormal posture: Secondary | ICD-10-CM

## 2022-02-28 DIAGNOSIS — R42 Dizziness and giddiness: Secondary | ICD-10-CM | POA: Diagnosis not present

## 2022-02-28 DIAGNOSIS — R262 Difficulty in walking, not elsewhere classified: Secondary | ICD-10-CM | POA: Diagnosis not present

## 2022-02-28 NOTE — Therapy (Signed)
?[facility] ?[address] ?[place], [place], [address] ?Phone: [phone]   Fax:  [phone] ? ?Physical Therapy Treatment ? ?Patient Details  ?Name: Cory Gomez ?MRN: [id number] ?Date of Birth: 1981/03/10 ?Referring Provider (PT): Irine Seal MD ? ? ?Encounter Date: 02/28/2022 ? ? PT End of Session - 02/28/22 1705   ? ? Visit Number 7   ? Number of Visits 18   ? Date for PT Re-Evaluation 04/04/22   ? Authorization Type [facility]   ? Authorization - Visit Number 7   ? Authorization - Number of Visits 25   ? PT Start Time 1620   ? PT Stop Time 6789   ? PT Time Calculation (min) 39 min   ? Equipment Utilized During Treatment Gait belt   ? Activity Tolerance Patient tolerated treatment well   ? Behavior During Therapy [facility] for tasks assessed/performed   ? ?  ?  ? ?  ? ? ?Past Medical History:  ?Diagnosis Date  ? ADHD (attention deficit hyperactivity disorder)   ? Anxiety and depression   ? COVID-19   ? 12/28/19  ? Insomnia   ? Obesity (BMI 30-39.9)   ? Prediabetes   ? ? ?Past Surgical History:  ?Procedure Laterality Date  ? NO PAST SURGERIES    ? ? ?There were no vitals filed for this visit. ? ? Subjective Assessment - 02/28/22 1621   ? ? Subjective Had an audiology appointment- was told that he has decreased hearing in the L ear. Been feeling "great" and the focusing has gotten 5x better than last week.   ? Pertinent History D/C summary on 01/09/22 - Previously healthy 41 year old male presenting for dizziness and ataxia.  No central etiology was identified on CT or MRI imaging.  Patient was found to have bilateral mastoid effusions with associated mastoid tenderness.  Symptoms were not able to be relieved while in the ED.  IV antibiotics were initiated and patient was admitted for continued management of mastoiditis and observation   ? Diagnostic tests 12/30/21 brain MRI: No evidence of acute intracranial abnormality. Severe paranasal sinus disease, suggestive of  sinusitis.Moderate right and small left mastoid effusions.   ? Patient Stated Goals improve dizziness   ? Currently in Pain? No/denies   ? ?  ?  ? ?  ? ? ? ? ? ? ? ? ? ? ? ? ? ? ? ? ? ? ? ? [facility] PT Treatment/Exercise - 02/28/22 0001   ? ?  ? Neuro Re-ed   ? Neuro Re-ed Details  tossing up/down and side/side + tracking ball while walking   ? ?  ?  ? ?  ? ? Vestibular Treatment/Exercise - 02/28/22 0001   ? ?  ? Laruth Bouchard Daroff  ? Number of Reps  2   ? Symptom Description  to each side; 0/10 dizziness with EO and EC   ?  ? X1 Viewing Horizontal  ? Foot Position standing   ? Reps --   30" x2  ? Comments able to use thumb d/t improved convergence; slow and limited gaze stability to R; no dizziness or blurred vision   ?  ? X1 Viewing Vertical  ? Foot Position standing   ? Reps --   30"  ? Comments intermittent episodes of limited gaze stability; no dizziness or blurred vision   ? ?  ?  ? ?  ? ? ? ? ? Balance Exercises - 02/28/22 0001   ? ?  ?   ?[facility] ?[address] ?[place], [place], [address] ?Phone: [phone]   Fax:  [phone] ? ?Physical Therapy Treatment ? ?Patient Details  ?Name: Cory Gomez ?MRN: [id number] ?Date of Birth: 1981/03/10 ?Referring Provider (PT): Irine Seal MD ? ? ?Encounter Date: 02/28/2022 ? ? PT End of Session - 02/28/22 1705   ? ? Visit Number 7   ? Number of Visits 18   ? Date for PT Re-Evaluation 04/04/22   ? Authorization Type [facility]   ? Authorization - Visit Number 7   ? Authorization - Number of Visits 25   ? PT Start Time 1620   ? PT Stop Time 6789   ? PT Time Calculation (min) 39 min   ? Equipment Utilized During Treatment Gait belt   ? Activity Tolerance Patient tolerated treatment well   ? Behavior During Therapy [facility] for tasks assessed/performed   ? ?  ?  ? ?  ? ? ?Past Medical History:  ?Diagnosis Date  ? ADHD (attention deficit hyperactivity disorder)   ? Anxiety and depression   ? COVID-19   ? 12/28/19  ? Insomnia   ? Obesity (BMI 30-39.9)   ? Prediabetes   ? ? ?Past Surgical History:  ?Procedure Laterality Date  ? NO PAST SURGERIES    ? ? ?There were no vitals filed for this visit. ? ? Subjective Assessment - 02/28/22 1621   ? ? Subjective Had an audiology appointment- was told that he has decreased hearing in the L ear. Been feeling "great" and the focusing has gotten 5x better than last week.   ? Pertinent History D/C summary on 01/09/22 - Previously healthy 41 year old male presenting for dizziness and ataxia.  No central etiology was identified on CT or MRI imaging.  Patient was found to have bilateral mastoid effusions with associated mastoid tenderness.  Symptoms were not able to be relieved while in the ED.  IV antibiotics were initiated and patient was admitted for continued management of mastoiditis and observation   ? Diagnostic tests 12/30/21 brain MRI: No evidence of acute intracranial abnormality. Severe paranasal sinus disease, suggestive of  sinusitis.Moderate right and small left mastoid effusions.   ? Patient Stated Goals improve dizziness   ? Currently in Pain? No/denies   ? ?  ?  ? ?  ? ? ? ? ? ? ? ? ? ? ? ? ? ? ? ? ? ? ? ? [facility] Adult PT Treatment/Exercise - 02/28/22 0001   ? ?  ? Neuro Re-ed   ? Neuro Re-ed Details  tossing up/down and side/side + tracking ball while walking   ? ?  ?  ? ?  ? ? Vestibular Treatment/Exercise - 02/28/22 0001   ? ?  ? Laruth Bouchard Daroff  ? Number of Reps  2   ? Symptom Description  to each side; 0/10 dizziness with EO and EC   ?  ? X1 Viewing Horizontal  ? Foot Position standing   ? Reps --   30" x2  ? Comments able to use thumb d/t improved convergence; slow and limited gaze stability to R; no dizziness or blurred vision   ?  ? X1 Viewing Vertical  ? Foot Position standing   ? Reps --   30"  ? Comments intermittent episodes of limited gaze stability; no dizziness or blurred vision   ? ?  ?  ? ?  ? ? ? ? ? Balance Exercises - 02/28/22 0001   ? ?  ?  exercise;Balance training;Neuromuscular re-education;Patient/family education;Manual techniques;Passive range of motion;Dry needling;Vestibular;Visual/perceptual remediation/compensation;Spinal Manipulations;Joint Manipulations   ? PT Next Visit Plan progress VOR, balance, coordination   ? Consulted and Agree with Plan of Care Patient   ? ?  ?  ? ?  ? ? ?Patient will benefit from skilled therapeutic intervention in order to improve the following deficits and impairments:  Dizziness, Postural dysfunction, Increased muscle spasms, Pain, Decreased activity tolerance, Decreased range of motion, Decreased balance, Difficulty walking, Impaired vision/preception ? ?Visit Diagnosis: ?Dizziness and giddiness ? ?Abnormal posture ? ?Cervicalgia ? ?Difficulty in walking, not elsewhere classified ? ? ? ? ?Problem List ?Patient Active Problem List  ? Diagnosis Date Noted  ? Nystagmus 01/22/2022  ? Hypertension 01/11/2022  ? Mastoiditis of both sides   ? Headache 12/31/2021  ? Vertigo   ? Sinusitis 12/30/2021  ? Hypertriglyceridemia 07/17/2021  ? Morbid obesity with BMI of 40.0-44.9, adult ([place]) 07/17/2021  ? Suicidal ideation 07/14/2021  ? Hyperlipidemia 01/13/2020  ? Inverted nipple 09/16/2019  ? Annual physical exam 09/09/2019  ? Acne 09/09/2019  ? Excessive ear wax, left 09/09/2019  ? Depression, recurrent ([place]) 09/09/2019  ? GAD (generalized anxiety disorder) 08/17/2019  ? ADD (attention deficit disorder) 07/24/2019  ? Binge eating disorder 07/24/2019  ? Vitamin D deficiency 06/11/2019  ? Elevated liver enzymes 06/11/2019  ? Prediabetes   ? Obesity (BMI 30-39.9)   ? Insomnia   ? Major depressive disorder, recurrent episode, in partial remission ([place])   ? ? ?Janene Harvey, PT, DPT ?02/28/22 5:07 PM ? ? ?[facility] ?[address], [address] ?[place], [place], [address] ?Phone: [phone]   Fax:  [phone] ? ?Name: Rhet Rorke II Kozma ?MRN: [id number] ?Date of Birth: 11-22-1981 ? ? ? ?

## 2022-03-05 ENCOUNTER — Ambulatory Visit: Payer: 59 | Admitting: Physical Therapy

## 2022-03-05 ENCOUNTER — Other Ambulatory Visit: Payer: Self-pay

## 2022-03-05 ENCOUNTER — Encounter: Payer: Self-pay | Admitting: Physical Therapy

## 2022-03-05 DIAGNOSIS — R262 Difficulty in walking, not elsewhere classified: Secondary | ICD-10-CM

## 2022-03-05 DIAGNOSIS — R293 Abnormal posture: Secondary | ICD-10-CM

## 2022-03-05 DIAGNOSIS — R42 Dizziness and giddiness: Secondary | ICD-10-CM | POA: Diagnosis not present

## 2022-03-05 DIAGNOSIS — M542 Cervicalgia: Secondary | ICD-10-CM | POA: Diagnosis not present

## 2022-03-05 NOTE — Therapy (Signed)
[facility] ?[address], [address] ?[place], [place], [address] ?Phone: [phone]   Fax:  [phone] ? ?Physical Therapy Treatment ? ?Patient Details  ?Name: Cory Gomez ?MRN: [id number] ?Date of Birth: 12-21-1981 ?Referring Provider (PT): Irine Seal MD ? ? ?Encounter Date: 03/05/2022 ? ? PT End of Session - 03/05/22 1703   ? ? Visit Number 8   ? Number of Visits 18   ? Date for PT Re-Evaluation 04/04/22   ? Authorization Type [facility]   ? Authorization - Visit Number 8   ? Authorization - Number of Visits 25   ? PT Start Time [phone]   ? PT Stop Time 5883   ? PT Time Calculation (min) 40 min   ? Equipment Utilized During Treatment Gait belt   ? Activity Tolerance Patient tolerated treatment well   ? Behavior During Therapy [facility] for tasks assessed/performed   ? ?  ?  ? ?  ? ? ?Past Medical History:  ?Diagnosis Date  ? ADHD (attention deficit hyperactivity disorder)   ? Anxiety and depression   ? COVID-19   ? 12/28/19  ? Insomnia   ? Obesity (BMI 30-39.9)   ? Prediabetes   ? ? ?Past Surgical History:  ?Procedure Laterality Date  ? NO PAST SURGERIES    ? ? ?There were no vitals filed for this visit. ? ? Subjective Assessment - 03/05/22 1619   ? ? Subjective Has been feeling fine. Has been doing his exercises and has not had any problems. Has been able to take his dog on a walk without dizziness or HA. Wears sunglasses when outside.   ? Pertinent History D/C summary on 01/09/22 - Previously healthy 41 year old male presenting for dizziness and ataxia.  No central etiology was identified on CT or MRI imaging.  Patient was found to have bilateral mastoid effusions with associated mastoid tenderness.  Symptoms were not able to be relieved while in the ED.  IV antibiotics were initiated and patient was admitted for continued management of mastoiditis and observation   ? Diagnostic tests 12/30/21 brain MRI: No evidence of acute intracranial abnormality. Severe paranasal sinus  disease, suggestive of sinusitis.Moderate right and small left mastoid effusions.   ? Patient Stated Goals improve dizziness   ? Currently in Pain? No/denies   ? ?  ?  ? ?  ? ? ? ? ? ? ? ? ? ? ? ? ? ? ? ? ? ? ? ? [facility] PT Treatment/Exercise - 03/05/22 0001   ? ?  ? Neuro Re-ed   ? Neuro Re-ed Details  R/L fwd/back stepping + head nods to targets 10x each with mod instability and CGA; R/L step up with medball at chest   ? ?  ?  ? ?  ? ? Vestibular Treatment/Exercise - 03/05/22 0001   ? ?  ? Vestibular Treatment/Exercise  ? Gaze Exercises Comment   sitting pencil pushups 10x; c/o blurred vision at ~5 in  ?  ? X1 Viewing Horizontal  ? Foot Position standing, standing on foam   ? Reps --   30" x2  ? Comments slow but improved gaze stability   ?  ? X1 Viewing Vertical  ? Foot Position standing, standing on foam   ? Reps --   30" x2  ? Comments slow but improved gaze stability   ? ?  ?  ? ?  ? ? ? ? ? Balance Exercises - 03/05/22 0001   ? ?  ?  comment)   1-2x  ? PT Duration 6 weeks   ? PT Treatment/Interventions Canalith  Repostioning;Moist Heat;Traction;Ultrasound;Gait training;Stair training;Functional mobility training;Therapeutic activities;Therapeutic exercise;Balance training;Neuromuscular re-education;Patient/family education;Manual techniques;Passive range of motion;Dry needling;Vestibular;Visual/perceptual remediation/compensation;Spinal Manipulations;Joint Manipulations   ? PT Next Visit Plan progress VOR, balance, coordination   ? Consulted and Agree with Plan of Care Patient   ? ?  ?  ? ?  ? ? ?Patient will benefit from skilled therapeutic intervention in order to improve the following deficits and impairments:  Dizziness, Postural dysfunction, Increased muscle spasms, Pain, Decreased activity tolerance, Decreased range of motion, Decreased balance, Difficulty walking, Impaired vision/preception ? ?Visit Diagnosis: ?Dizziness and giddiness ? ?Abnormal posture ? ?Cervicalgia ? ?Difficulty in walking, not elsewhere classified ? ? ? ? ?Problem List ?Patient Active Problem List  ? Diagnosis Date Noted  ? Nystagmus 01/22/2022  ? Hypertension 01/11/2022  ? Mastoiditis of both sides   ? Headache 12/31/2021  ? Vertigo   ? Sinusitis 12/30/2021  ? Hypertriglyceridemia 07/17/2021  ? Morbid obesity with BMI of 40.0-44.9, adult ([place]) 07/17/2021  ? Suicidal ideation 07/14/2021  ? Hyperlipidemia 01/13/2020  ? Inverted nipple 09/16/2019  ? Annual physical exam 09/09/2019  ? Acne 09/09/2019  ? Excessive ear wax, left 09/09/2019  ? Depression, recurrent ([place]) 09/09/2019  ? GAD (generalized anxiety disorder) 08/17/2019  ? ADD (attention deficit disorder) 07/24/2019  ? Binge eating disorder 07/24/2019  ? Vitamin D deficiency 06/11/2019  ? Elevated liver enzymes 06/11/2019  ? Prediabetes   ? Obesity (BMI 30-39.9)   ? Insomnia   ? Major depressive disorder, recurrent episode, in partial remission ([place])   ? ? ?Janene Harvey, PT, DPT ?03/05/22 5:15 PM ? ? ?[facility] ?[address], [address] ?[place], [place], [address] ?Phone: [phone]   Fax:  [phone] ? ?Name: Cory Gomez ?MRN: [id number] ?Date of Birth: 1981-09-19 ? ? ? ?  [facility] ?[address], [address] ?[place], [place], [address] ?Phone: [phone]   Fax:  [phone] ? ?Physical Therapy Treatment ? ?Patient Details  ?Name: Cory Gomez ?MRN: [id number] ?Date of Birth: 12-21-1981 ?Referring Provider (PT): Irine Seal MD ? ? ?Encounter Date: 03/05/2022 ? ? PT End of Session - 03/05/22 1703   ? ? Visit Number 8   ? Number of Visits 18   ? Date for PT Re-Evaluation 04/04/22   ? Authorization Type [facility]   ? Authorization - Visit Number 8   ? Authorization - Number of Visits 25   ? PT Start Time [phone]   ? PT Stop Time 5883   ? PT Time Calculation (min) 40 min   ? Equipment Utilized During Treatment Gait belt   ? Activity Tolerance Patient tolerated treatment well   ? Behavior During Therapy [facility] for tasks assessed/performed   ? ?  ?  ? ?  ? ? ?Past Medical History:  ?Diagnosis Date  ? ADHD (attention deficit hyperactivity disorder)   ? Anxiety and depression   ? COVID-19   ? 12/28/19  ? Insomnia   ? Obesity (BMI 30-39.9)   ? Prediabetes   ? ? ?Past Surgical History:  ?Procedure Laterality Date  ? NO PAST SURGERIES    ? ? ?There were no vitals filed for this visit. ? ? Subjective Assessment - 03/05/22 1619   ? ? Subjective Has been feeling fine. Has been doing his exercises and has not had any problems. Has been able to take his dog on a walk without dizziness or HA. Wears sunglasses when outside.   ? Pertinent History D/C summary on 01/09/22 - Previously healthy 41 year old male presenting for dizziness and ataxia.  No central etiology was identified on CT or MRI imaging.  Patient was found to have bilateral mastoid effusions with associated mastoid tenderness.  Symptoms were not able to be relieved while in the ED.  IV antibiotics were initiated and patient was admitted for continued management of mastoiditis and observation   ? Diagnostic tests 12/30/21 brain MRI: No evidence of acute intracranial abnormality. Severe paranasal sinus  disease, suggestive of sinusitis.Moderate right and small left mastoid effusions.   ? Patient Stated Goals improve dizziness   ? Currently in Pain? No/denies   ? ?  ?  ? ?  ? ? ? ? ? ? ? ? ? ? ? ? ? ? ? ? ? ? ? ? [facility] Adult PT Treatment/Exercise - 03/05/22 0001   ? ?  ? Neuro Re-ed   ? Neuro Re-ed Details  R/L fwd/back stepping + head nods to targets 10x each with mod instability and CGA; R/L step up with medball at chest   ? ?  ?  ? ?  ? ? Vestibular Treatment/Exercise - 03/05/22 0001   ? ?  ? Vestibular Treatment/Exercise  ? Gaze Exercises Comment   sitting pencil pushups 10x; c/o blurred vision at ~5 in  ?  ? X1 Viewing Horizontal  ? Foot Position standing, standing on foam   ? Reps --   30" x2  ? Comments slow but improved gaze stability   ?  ? X1 Viewing Vertical  ? Foot Position standing, standing on foam   ? Reps --   30" x2  ? Comments slow but improved gaze stability   ? ?  ?  ? ?  ? ? ? ? ? Balance Exercises - 03/05/22 0001   ? ?  ?

## 2022-03-07 ENCOUNTER — Other Ambulatory Visit: Payer: Self-pay

## 2022-03-07 ENCOUNTER — Encounter: Payer: Self-pay | Admitting: Physical Therapy

## 2022-03-07 ENCOUNTER — Ambulatory Visit: Payer: 59 | Admitting: Physical Therapy

## 2022-03-07 DIAGNOSIS — R262 Difficulty in walking, not elsewhere classified: Secondary | ICD-10-CM | POA: Diagnosis not present

## 2022-03-07 DIAGNOSIS — R42 Dizziness and giddiness: Secondary | ICD-10-CM | POA: Diagnosis not present

## 2022-03-07 DIAGNOSIS — R293 Abnormal posture: Secondary | ICD-10-CM | POA: Diagnosis not present

## 2022-03-07 DIAGNOSIS — M542 Cervicalgia: Secondary | ICD-10-CM | POA: Diagnosis not present

## 2022-03-07 NOTE — Therapy (Signed)
?[facility] ?[address], [address] ?[place], [place], [address] ?Phone: [phone]   Fax:  [phone] ? ?Physical Therapy Discharge Summary ? ?Patient Details  ?Name: Cory Gomez ?MRN: [id number] ?Date of Birth: [birth year] ?Referring Provider (PT): Ramiro Harvest MD ? ? ?Encounter Date: 03/07/2022 ? ? PT End of Session - 03/07/22 1644   ? ? Visit Number 9   ? Number of Visits 18   ? Date for PT Re-Evaluation 04/04/22   ? Authorization Type [facility]   ? Authorization - Visit Number 9   ? Authorization - Number of Visits 25   ? PT Start Time 1605   ? PT Stop Time 1639   ? PT Time Calculation (min) 34 min   ? Equipment Utilized During Treatment Gait belt   ? Activity Tolerance Patient tolerated treatment well   ? Behavior During Therapy [facility] for tasks assessed/performed   ? ?  ?  ? ?  ? ? ?Past Medical History:  ?Diagnosis Date  ? ADHD (attention deficit hyperactivity disorder)   ? Anxiety and depression   ? COVID-19   ? 12/28/19  ? Insomnia   ? Obesity (BMI 30-39.9)   ? Prediabetes   ? ? ?Past Surgical History:  ?Procedure Laterality Date  ? NO PAST SURGERIES    ? ? ?There were no vitals filed for this visit. ? ? Subjective Assessment - 03/07/22 1601   ? ? Subjective Not much, eveything is going good and not having any dizziness. Reports that he tried driving again yesterday and was able to perform quick head turns without issues. Reports 98% improvement since initial eval.   ? Pertinent History D/C summary on 01/09/22 - Previously healthy 41 year old male presenting for dizziness and ataxia.  No central etiology was identified on CT or MRI imaging.  Patient was found to have bilateral mastoid effusions with associated mastoid tenderness.  Symptoms were not able to be relieved while in the ED.  IV antibiotics were initiated and patient was admitted for continued management of mastoiditis and observation   ? Diagnostic tests 12/30/21 brain MRI: No evidence of acute  intracranial abnormality. Severe paranasal sinus disease, suggestive of sinusitis.Moderate right and small left mastoid effusions.   ? Patient Stated Goals improve dizziness   ? Currently in Pain? No/denies   ? ?  ?  ? ?  ? ? ? ? ? [facility] PT Assessment - 03/07/22 0001   ? ?  ? Assessment  ? Medical Diagnosis J01.40 (ICD-10-CM) - Acute pansinusitis, recurrence not specified   ? Referring Provider (PT) Ramiro Harvest MD   ? Onset Date/Surgical Date 12/26/21   ?  ? Dynamic Gait Index  ? Level Surface Normal   ? Change in Gait Speed Normal   ? Gait with Horizontal Head Turns Normal   ? Gait with Vertical Head Turns Normal   ? Gait and Pivot Turn Normal   ? Step Over Obstacle Normal   ? Step Around Obstacles Mild Impairment   ? Steps Normal   ? Total Score 23   ? ?  ?  ? ?  ? ? ? ? ? ? ? ? ? ? ? ? ? ? ? ? [facility] Adult PT Treatment/Exercise - 03/07/22 0001   ? ?  ? Neuro Re-ed   ? Neuro Re-ed Details  R/L fwd/back stepping + head turns to targets 10x each with mod instability with L LE supporting and CGA throughout   ? ?  ?  ? ?  ? ?  Vestibular Treatment/Exercise - 03/07/22 0001   ? ?  ? Vestibular Treatment/Exercise  ? Habituation Exercises --   simulating bed mobility including sit<>supine and R/L rolling with report of 0/10 dizziness  ?  ? X1 Viewing Horizontal  ? Foot Position standing   ? Reps --   30 sec  ? Comments no c/o dizziness; good quick pace but with several episodes of limited gaze stability   ?  ? X1 Viewing Vertical  ? Foot Position standing   ? Reps --   30 sec  ? Comments no c/o dizziness; good quick pace and good gaze stability   ? ?  ?  ? ?  ? ? ? ? ? Balance Exercises - 03/07/22 0001   ? ?  ? Balance Exercises: Standing  ? Standing Eyes Closed Narrow base of support (BOS);Foam/compliant surface;2 reps;30 secs   mild sway  ? ?  ?  ? ?  ? ? ? ? ? PT Education - 03/07/22 1643   ? ? Education Details update to HEP-Access Code: [email or web address]   ? Person(s) Educated Patient   ? Methods  Explanation;Demonstration;Tactile cues;Verbal cues;Handout   ? Comprehension Verbalized understanding;Returned demonstration   ? ?  ?  ? ?  ? ? ? PT Short Term Goals - 03/07/22 1621   ? ?  ? PT SHORT TERM GOAL #1  ? Title Patient to be independent with initial HEP.   ? Time 3   ? Period Weeks   ? Status Achieved   ? Target Date 02/15/22   ? ?  ?  ? ?  ? ? ? ? PT Long Term Goals - 03/07/22 1621   ? ?  ? PT LONG TERM GOAL #1  ? Title Patient to be independent with advanced HEP.   ? Time 6   ? Period Weeks   ? Status Achieved   ? Target Date 04/04/22   ?  ? PT LONG TERM GOAL #2  ? Title Patient to perform bed mobility without dizziness limiting.   ? Time 6   ? Period Weeks   ? Status Achieved   ? Target Date 04/04/22   ?  ? PT LONG TERM GOAL #3  ? Title Patient to tolerate standing VOR x1 for 30 sec with 0/10 dizziness.   ? Time 6   ? Period Weeks   ? Status Achieved   ? Target Date 04/04/22   ?  ? PT LONG TERM GOAL #4  ? Title Patient to score at least 20/24 on DGI in order to decrease risk of falls.   ? Time 6   ? Period Weeks   ? Status Achieved   ? Target Date 04/04/22   ?  ? PT LONG TERM GOAL #5  ? Title Patient to report tolerance for driving in order to return back to work.   ? Time 6   ? Period Weeks   ? Status Achieved   reports tolerance for quick head turns with driving without symptoms  ? Target Date 04/04/22   ? ?  ?  ? ?  ? ? ? ? ? ? ? ? Plan - 03/07/22 1645   ? ? Clinical Impression Statement Patient arrived to session with report of 98% improvement in dizziness since initial eval. Reports that he was able to drive yesterday and able to perform quick head turns without complaints. Able to simulate bed with report of 0/10 dizziness. Patient demonstrate improved speed and tolerance with VOR  but still with episodes of limited gaze stability in horizontal direction. Patient scored 23/24 on DGI, indicating a decreased risk of falls. Worked on Field seismologist HEP for max benefit. Patient reported  understanding and without complaints at end of session. Patient has met all goals at this time and is ready for DC.   ? Examination-Activity Limitations Stand;Locomotion Level;Bend;Dressing;Stairs;Transfers   ? Examination-Participation Restrictions Meal Prep;Yard Work;Community Activity;Occupation;Driving;Shop   ? Stability/Clinical Decision Making Evolving/Moderate complexity   ? Rehab Potential Good   ? PT Frequency Other (comment)   1-2x  ? PT Duration 6 weeks   ? PT Treatment/Interventions Canalith Repostioning;Moist Heat;Traction;Ultrasound;Gait training;Stair training;Functional mobility training;Therapeutic activities;Therapeutic exercise;Balance training;Neuromuscular re-education;Patient/family education;Manual techniques;Passive range of motion;Dry needling;Vestibular;Visual/perceptual remediation/compensation;Spinal Manipulations;Joint Manipulations   ? PT Next Visit Plan DC at this time   ? Consulted and Agree with Plan of Care Patient   ? ?  ?  ? ?  ? ? ?Patient will benefit from skilled therapeutic intervention in order to improve the following deficits and impairments:  Dizziness, Postural dysfunction, Increased muscle spasms, Pain, Decreased activity tolerance, Decreased range of motion, Decreased balance, Difficulty walking, Impaired vision/preception ? ?Visit Diagnosis: ?Dizziness and giddiness ? ?Abnormal posture ? ?Cervicalgia ? ?Difficulty in walking, not elsewhere classified ? ? ? ? ?Problem List ?Patient Active Problem List  ? Diagnosis Date Noted  ? Nystagmus 01/22/2022  ? Hypertension 01/11/2022  ? Mastoiditis of both sides   ? Headache 12/31/2021  ? Vertigo   ? Sinusitis 12/30/2021  ? Hypertriglyceridemia 07/17/2021  ? Morbid obesity with BMI of 40.0-44.9, adult ([facility]) 07/17/2021  ? Suicidal ideation 07/14/2021  ? Hyperlipidemia 01/13/2020  ? Inverted nipple 09/16/2019  ? Annual physical exam 09/09/2019  ? Acne 09/09/2019  ? Excessive ear wax, left 09/09/2019  ? Depression, recurrent ([facility])  09/09/2019  ? GAD (generalized anxiety disorder) 08/17/2019  ? ADD (attention deficit disorder) 07/24/2019  ? Binge eating disorder 07/24/2019  ? Vitamin D deficiency 06/11/2019  ? Elevated liver enzymes 06/11/2019  ? Prediabetes   ? Obesit

## 2022-03-12 ENCOUNTER — Ambulatory Visit: Payer: 59 | Admitting: Physical Therapy

## 2022-03-13 ENCOUNTER — Ambulatory Visit: Payer: 59 | Admitting: Physical Therapy

## 2022-05-11 ENCOUNTER — Ambulatory Visit: Payer: 59 | Admitting: Internal Medicine

## 2022-11-25 ENCOUNTER — Encounter (HOSPITAL_BASED_OUTPATIENT_CLINIC_OR_DEPARTMENT_OTHER): Payer: Self-pay | Admitting: Emergency Medicine

## 2022-11-25 ENCOUNTER — Other Ambulatory Visit: Payer: Self-pay

## 2022-11-25 ENCOUNTER — Emergency Department (HOSPITAL_BASED_OUTPATIENT_CLINIC_OR_DEPARTMENT_OTHER)
Admission: EM | Admit: 2022-11-25 | Discharge: 2022-11-25 | Disposition: A | Discharge status: Home / Self Care | Payer: BC Managed Care – PPO | Attending: Emergency Medicine | Admitting: Emergency Medicine

## 2022-11-25 DIAGNOSIS — Z1152 Encounter for screening for COVID-19: Secondary | ICD-10-CM | POA: Insufficient documentation

## 2022-11-25 DIAGNOSIS — B349 Viral infection, unspecified: Secondary | ICD-10-CM | POA: Insufficient documentation

## 2022-11-25 DIAGNOSIS — R509 Fever, unspecified: Secondary | ICD-10-CM | POA: Diagnosis not present

## 2022-11-25 DIAGNOSIS — R251 Tremor, unspecified: Secondary | ICD-10-CM | POA: Diagnosis not present

## 2022-11-25 LAB — RESP PANEL BY RT-PCR (FLU A&B, COVID) ARPGX2
Influenza A by PCR: NEGATIVE
Influenza B by PCR: NEGATIVE
SARS Coronavirus 2 by RT PCR: NEGATIVE

## 2022-11-25 MED ORDER — AMOXICILLIN-POT CLAVULANATE 875-125 MG PO TABS: 1.0000 | ORAL_TABLET | Freq: Two times a day (BID) | ORAL | 0 refills | Status: DC

## 2022-11-25 MED ORDER — DEXAMETHASONE 4 MG PO TABS
10.0000 mg | ORAL_TABLET | Freq: Once | ORAL | Status: AC
  Administered 2022-11-25: 10 mg via ORAL
  Filled 2022-11-25: qty 3

## 2022-11-25 MED ORDER — AMOXICILLIN-POT CLAVULANATE 875-125 MG PO TABS
1.0000 | ORAL_TABLET | Freq: Once | ORAL | Status: AC
  Administered 2022-11-25: 1 via ORAL
  Filled 2022-11-25: qty 1

## 2022-11-25 NOTE — ED Triage Notes (Signed)
Patient reports last night he had cold chills, today he had fever over 100 throughout the day. Reports he has also been having dizzy spells.

## 2022-11-25 NOTE — ED Provider Notes (Signed)
[facility] EMERGENCY DEPARTMENT Provider Note   CSN: [id number] Arrival date & time: 11/25/22  1708     History  Chief Complaint  Patient presents with   Fever    Cory Gomez is a 41 y.o. male.  41 yo M with a cc of congestion and fever instability dizziness.  This been going on for couple days.  He had something similar back in January of last year.  It is being admitted to the hospital.  At that time had an MRI with pansinusitis.  He is wondering if he has developed the same thing.  No known sick contacts.  Temperature as high as 101 at home.   Fever      Home Medications Prior to Admission medications   Medication Sig Start Date End Date Taking? Authorizing Provider  amoxicillin-clavulanate (AUGMENTIN) 875-125 MG tablet Take 1 tablet by mouth every 12 (twelve) hours. 11/25/22  Yes Deno Etienne, DO  acetaminophen (TYLENOL) 500 MG tablet Take 500-1,000 mg by mouth every 6 (six) hours as needed (for headaches).    [provider]  amLODipine (NORVASC) 5 MG tablet Take 1 tablet (5 mg total) by mouth daily. 01/11/22   McLean-Scocuzza, Nino Glow, MD  amphetamine-dextroamphetamine (ADDERALL XR) 30 MG 24 hr capsule Take 1 capsule (30 mg total) by mouth every morning. Further refills psychiatry Patient taking differently: Take 30 mg by mouth in the morning. 12/29/21 01/31/22  McLean-Scocuzza, Nino Glow, MD  Cholecalciferol 1.25 MG (50000 UT) capsule Take 1 capsule (50,000 Units total) by mouth once a week. 06/20/20   McLean-Scocuzza, Nino Glow, MD  DULoxetine (CYMBALTA) 60 MG capsule Take 1 capsule (60 mg total) by mouth daily. Further refills Dr. Nicolasa Ducking Patient taking differently: Take 60 mg by mouth in the morning. 08/01/21 04/01/22  McLean-Scocuzza, Nino Glow, MD  fluticasone (FLONASE) 50 MCG/ACT nasal spray Place 2 sprays into both nostrils daily as needed for allergies or rhinitis. 01/11/22   McLean-Scocuzza, Nino Glow, MD  ketoconazole (NIZORAL) 2 % shampoo Apply 1  application topically 3 (three) times a week. Let sit x 5 minutes trunk 01/12/22   McLean-Scocuzza, Nino Glow, MD  loratadine (CLARITIN) 10 MG tablet Take 1 tablet (10 mg total) by mouth daily as needed for allergies. 01/11/22   McLean-Scocuzza, Nino Glow, MD  meclizine (ANTIVERT) 25 MG tablet Take 1 tablet (25 mg total) by mouth 2 (two) times daily as needed for dizziness. 01/11/22   McLean-Scocuzza, Nino Glow, MD  sodium chloride (OCEAN) 0.65 % SOLN nasal spray Place 2 sprays into both nostrils as needed for congestion. Before flonase 01/11/22   McLean-Scocuzza, Nino Glow, MD  traMADol (ULTRAM) 50 MG tablet Take 1 tablet  by mouth every 6  hours as needed. 01/01/22 01/01/23  Eugenie Filler, MD  zolpidem (AMBIEN CR) 12.5 MG CR tablet Take 1 tablet (12.5 mg total) by mouth at bedtime as needed for sleep. 07/14/21 01/31/22  McLean-Scocuzza, Nino Glow, MD      Allergies    Patient has no known allergies.    Review of Systems   Review of Systems  Constitutional:  Positive for fever.    Physical Exam Updated Vital Signs BP (!) 147/97 (BP Location: Right Arm)   Pulse (!) 109   Temp 99.6 F (37.6 C) (Oral)   Resp 20   Ht 6\' 2"  (1.88 m)   Wt 129.3 kg   SpO2 94%   BMI 36.59 kg/m  Physical Exam Vitals and nursing note reviewed.  Constitutional:  Appearance: He is well-developed.  HENT:     Head: Normocephalic and atraumatic.     Comments: Swollen turbinates, posterior nasal drip, right frontal sinus with exquisite tenderness to percussion, tm normal bilaterally.   Eyes:     Pupils: Pupils are equal, round, and reactive to light.  Neck:     Vascular: No JVD.  Cardiovascular:     Rate and Rhythm: Normal rate and regular rhythm.     Heart sounds: No murmur heard.    No friction rub. No gallop.  Pulmonary:     Effort: No respiratory distress.     Breath sounds: No wheezing.  Abdominal:     General: There is no distension.     Tenderness: There is no abdominal tenderness. There is no guarding  or rebound.  Musculoskeletal:        General: Normal range of motion.     Cervical back: Normal range of motion and neck supple.  Skin:    Coloration: Skin is not pale.     Findings: No rash.  Neurological:     Mental Status: He is alert and oriented to person, place, and time.     Cranial Nerves: Cranial nerves 2-12 are intact.     Sensory: Sensation is intact.     Motor: Motor function is intact.     Coordination: Coordination is intact.     Comments: Patient has a bit of an unsteady gait but is able to correct his instability and ambulate without much issue.  He was able to turn very rapidly at the door as well.  Otherwise has a benign neurologic exam.  Psychiatric:        Behavior: Behavior normal.     ED Results / Procedures / Treatments   Labs (all labs ordered are listed, but only abnormal results are displayed) Labs Reviewed  RESP PANEL BY RT-PCR (FLU A&B, COVID) ARPGX2    EKG None  Radiology No results found.  Procedures Procedures    Medications Ordered in ED Medications  dexamethasone (DECADRON) tablet 10 mg (has no administration in time range)  amoxicillin-clavulanate (AUGMENTIN) 875-125 MG per tablet 1 tablet (has no administration in time range)    ED Course/ Medical Decision Making/ A&P                           Medical Decision Making Risk Prescription drug management.   41 yo M with a chief complaints of what sounds like a viral URI.  Cough congestion going on for about 24 hours.  He developed a fever and some shaking chills at home.  The concern from the patient and his family are actually that he might have redeveloped symptoms that he had back in January of this past year.  At that time he was diagnosed with pansinusitis and was had severe difficulty with ambulation required to 3 days they have been 4 months of physical therapy.  Patient is able to ambulate here without much issue.  Otherwise has benign neurologic exam.  Does have clinical signs  of URI and has some sinus tenderness on percussion.  Will give a dose of steroids here for possible lab otitis.  Will start on Augmentin.  PCP follow-up.  9:04 PM:  I have discussed the diagnosis/risks/treatment options with the patient and family.  Evaluation and diagnostic testing in the emergency department does not suggest an emergent condition requiring admission or immediate intervention beyond what has been performed at  this time.  They will follow up with PCP. We also discussed returning to the ED immediately if new or worsening sx occur. We discussed the sx which are most concerning (e.g., sudden worsening pain, fever, inability to tolerate by mouth) that necessitate immediate return. Medications administered to the patient during their visit and any new prescriptions provided to the patient are listed below.  Medications given during this visit Medications  dexamethasone (DECADRON) tablet 10 mg (has no administration in time range)  amoxicillin-clavulanate (AUGMENTIN) 875-125 MG per tablet 1 tablet (has no administration in time range)     The patient appears reasonably screen and/or stabilized for discharge and I doubt any other medical condition or other [facility] requiring further screening, evaluation, or treatment in the ED at this time prior to discharge.          Final Clinical Impression(s) / ED Diagnoses Final diagnoses:  Viral illness    Rx / DC Orders ED Discharge Orders          Ordered    amoxicillin-clavulanate (AUGMENTIN) 875-125 MG tablet  Every 12 hours        11/25/22 2100              Deno Etienne, DO 11/25/22 2104

## 2022-11-25 NOTE — ED Notes (Signed)
D/c paperwork reviewed with pt, including prescription and f/u care. Pt informed that he was febrile with temp of 100.6.  RN offered to obtain order for antipyretic, pt reports he is comfortable to go home and take tylenol at home. Family at bedside. Pt ambulatory to ED exit without assistance, NAD, accompanied by family.

## 2022-12-03 ENCOUNTER — Encounter: Payer: Self-pay | Admitting: Family Medicine

## 2022-12-03 ENCOUNTER — Ambulatory Visit: Payer: BC Managed Care – PPO | Admitting: Family Medicine

## 2022-12-03 VITALS — BP 128/87 | HR 77 | Temp 97.9°F | Ht 74.0 in | Wt 291.4 lb

## 2022-12-03 DIAGNOSIS — F3341 Major depressive disorder, recurrent, in partial remission: Secondary | ICD-10-CM

## 2022-12-03 DIAGNOSIS — F5081 Binge eating disorder: Secondary | ICD-10-CM

## 2022-12-03 DIAGNOSIS — F900 Attention-deficit hyperactivity disorder, predominantly inattentive type: Secondary | ICD-10-CM

## 2022-12-03 DIAGNOSIS — H6123 Impacted cerumen, bilateral: Secondary | ICD-10-CM | POA: Diagnosis not present

## 2022-12-03 DIAGNOSIS — F411 Generalized anxiety disorder: Secondary | ICD-10-CM

## 2022-12-03 DIAGNOSIS — J324 Chronic pansinusitis: Secondary | ICD-10-CM | POA: Diagnosis not present

## 2022-12-03 DIAGNOSIS — I10 Essential (primary) hypertension: Secondary | ICD-10-CM

## 2022-12-03 MED ORDER — MOMETASONE FUROATE 50 MCG/ACT NA SUSP: 2.0000 | Freq: Every day | NASAL | 12 refills | Status: DC

## 2022-12-03 MED ORDER — AMOXICILLIN-POT CLAVULANATE 875-125 MG PO TABS: 1.0000 | ORAL_TABLET | Freq: Two times a day (BID) | ORAL | 0 refills | Status: AC

## 2022-12-03 MED ORDER — PREDNISONE 10 MG (21) PO TBPK: ORAL_TABLET | ORAL | 0 refills | Status: AC

## 2022-12-03 MED ORDER — LORATADINE 5 MG PO TBDP: 5.0000 mg | ORAL_TABLET | Freq: Every day | ORAL | 1 refills | Status: DC

## 2022-12-03 NOTE — Patient Instructions (Addendum)
It was a pleasure meeting you today. Thank you for allowing me to take part in your health care.  Our goals for today as we discussed include:  Increase hydration  Start steroids and antibiotics  Follow up in 2 weeks Recommend referral to ENT if no improvement  Psychiatry referral today   If you have any questions or concerns, please do not hesitate to call the office at [phone].  I look forward to our next visit and until then take care and stay safe.  Regards,   [name], MD   [facility]

## 2022-12-03 NOTE — Progress Notes (Signed)
SUBJECTIVE:   Chief Complaint  Patient presents with   Follow-up    ED FU- Light sensitvity Balance issues Dizziness x 6 weeks   HPI  Patient presents to clinic for recent ED visit 12/03 viral illness.  COVID and flu negative at that time.  He was treated Decadron 10 mg and discharged with 7-day course of Augmentin.  He reports that symptoms have not improved and help worsen.  Continues to have dizziness, headache, morning cough, light sensitivity, and balance issues.  Denies any fevers, shortness of breath, or chest pain.  He reports he had similar symptoms in the past where he was admitted to hospital and was treated for sinusitis.  He was seen by neurology for his dizziness and balance issues, workup he reports was negative.   Previous workup. CT head from 12/29/21 shows marked severity bilateral maxillary, ethmoid and sphenoid sinus disease.  No intracranial abnormalities.  MRI from 12/30/21 again shows no evidence of acute intracranial abnormality.  Severe paraspinal sinus disease was again noted.  With moderate right and small left mastoid effusions.  CT venogram head also performed on 12/30/2021 did not show any large vessel occlusion or significant arterial stenosis.  There is no evidence of dural venous sinus thrombosis.  CT angio head did not show any aneurysm in the anterior posterior circulating system.   Seen by ENT 02/12/2022.  Mood disorder Patient reports history of ADD, depression, GAD.  Was previously seen in hospital for symptoms and started on duloxetine.  Has not been taking medications for quite some time given had not been able to follow-up with psychiatry.   PERTINENT PMH / PSH: Hypertension Mastoiditis Chronic sinusitis MDD ADD Binge eating disorder GAD Obesity class III Vertigo Hypertriglyceridemia   OBJECTIVE:  BP 128/87   Pulse 77   Temp 97.9 F (36.6 C)   Ht 6\' 2"  (1.88 m)   Wt 291 lb 6.4 oz (132.2 kg)   SpO2 99%   BMI 37.41 kg/m     Physical Exam Vitals reviewed.  Constitutional:      General: He is not in acute distress.    Appearance: Normal appearance. He is not ill-appearing, toxic-appearing or diaphoretic.  HENT:     Right Ear: Ear canal and external ear normal. There is impacted cerumen.     Left Ear: Ear canal and external ear normal. There is impacted cerumen.     Nose: Nose normal. No congestion or rhinorrhea.     Mouth/Throat:     Mouth: Mucous membranes are moist.     Pharynx: Oropharynx is clear. No oropharyngeal exudate or posterior oropharyngeal erythema.  Eyes:     General:        Right eye: No discharge.        Left eye: No discharge.     Conjunctiva/sclera: Conjunctivae normal.  Cardiovascular:     Rate and Rhythm: Normal rate and regular rhythm.     Heart sounds: Normal heart sounds.  Pulmonary:     Effort: Pulmonary effort is normal.     Breath sounds: Normal breath sounds.  Musculoskeletal:        General: Normal range of motion.     Cervical back: Normal range of motion.  Lymphadenopathy:     Cervical: No cervical adenopathy.  Skin:    General: Skin is warm and dry.     Capillary Refill: Capillary refill takes less than 2 seconds.  Neurological:     General: No focal deficit present.  Mental Status: He is alert and oriented to person, place, and time. Mental status is at baseline.     Sensory: No sensory deficit.     Motor: No weakness.     Coordination: Coordination normal.     Gait: Gait normal.     Deep Tendon Reflexes: Reflexes normal.  Psychiatric:        Mood and Affect: Mood normal.        Behavior: Behavior normal.        Thought Content: Thought content normal.        Judgment: Judgment normal.     ASSESSMENT/PLAN:  Chronic pansinusitis Assessment & Plan: Chronic.  Continues to have symptoms after treatment from ED visit 12/03.   Continue Augmentin x 5 days, total 10 day treatment Start Prednisone pack x 10 days Refer ENT if no improvement in  symptoms   Orders: -     predniSONE; Take 4 tablets (40 mg total) by mouth daily for 2 days, THEN 3 tablets (30 mg total) daily for 2 days, THEN 2 tablets (20 mg total) daily for 3 days, THEN 1 tablet (10 mg total) daily for 3 days.  Dispense: 23 tablet; Refill: 0 -     Mometasone Furoate; Place 2 sprays into the nose daily.  Dispense: 1 each; Refill: 12 -     Loratadine; Take 1 tablet (5 mg total) by mouth daily.  Dispense: 60 tablet; Refill: 1 -     Amoxicillin-Pot Clavulanate; Take 1 tablet by mouth 2 (two) times daily for 3 days.  Dispense: 6 tablet; Refill: 0  Bilateral impacted cerumen Assessment & Plan: Start Debrox x 5 days If no improvement can schedule appointment for flushing with RN clinic   Major depressive disorder, recurrent episode, in partial remission (HCC) -     Ambulatory referral to Psychiatry  GAD (generalized anxiety disorder) -     Ambulatory referral to Psychiatry  Binge eating disorder -     Ambulatory referral to Psychiatry  Attention deficit hyperactivity disorder (ADHD), predominantly inattentive type -     Ambulatory referral to Psychiatry  Hypertension, unspecified type Assessment & Plan: Chronic.  Asymptomatic.  Well-controlled without antihypertensive.  Previously prescribed amlodipine 5 mg daily.  Patient has not been taking medication. Continue to monitor blood pressure Strict return precautions provided.    PDMP reviewed  Return if symptoms worsen or fail to improve, for PCP.  [name], MD

## 2022-12-17 ENCOUNTER — Encounter: Payer: Self-pay | Admitting: Family Medicine

## 2022-12-17 DIAGNOSIS — H6123 Impacted cerumen, bilateral: Secondary | ICD-10-CM | POA: Insufficient documentation

## 2022-12-17 NOTE — Assessment & Plan Note (Signed)
Start Debrox x 5 days If no improvement can schedule appointment for flushing with RN clinic

## 2022-12-17 NOTE — Assessment & Plan Note (Signed)
Chronic.  Continues to have symptoms after treatment from ED visit 12/03.   Continue Augmentin x 5 days, total 10 day treatment Start Prednisone pack x 10 days Refer ENT if no improvement in symptoms

## 2022-12-17 NOTE — Assessment & Plan Note (Signed)
Chronic.  Asymptomatic.  Well-controlled without antihypertensive.  Previously prescribed amlodipine 5 mg daily.  Patient has not been taking medication. Continue to monitor blood pressure Strict return precautions provided.

## 2022-12-21 ENCOUNTER — Telehealth: Payer: Self-pay

## 2022-12-21 NOTE — Telephone Encounter (Signed)
Left message regarding paperwork is ready except the Patient's part needs to be filled out

## 2023-01-04 ENCOUNTER — Other Ambulatory Visit (HOSPITAL_COMMUNITY): Payer: Self-pay

## 2023-01-15 ENCOUNTER — Encounter (HOSPITAL_COMMUNITY): Payer: Self-pay | Admitting: Psychiatry

## 2023-01-15 ENCOUNTER — Ambulatory Visit (HOSPITAL_BASED_OUTPATIENT_CLINIC_OR_DEPARTMENT_OTHER): Payer: BC Managed Care – PPO | Admitting: Psychiatry

## 2023-01-15 DIAGNOSIS — F330 Major depressive disorder, recurrent, mild: Secondary | ICD-10-CM | POA: Diagnosis not present

## 2023-01-15 DIAGNOSIS — F411 Generalized anxiety disorder: Secondary | ICD-10-CM | POA: Diagnosis not present

## 2023-01-15 DIAGNOSIS — F5105 Insomnia due to other mental disorder: Secondary | ICD-10-CM

## 2023-01-15 MED ORDER — BUPROPION HCL ER (XL) 150 MG PO TB24: 150.0000 mg | ORAL_TABLET | ORAL | 1 refills | Status: DC

## 2023-01-15 MED ORDER — ZOLPIDEM TARTRATE 10 MG PO TABS: 10.0000 mg | ORAL_TABLET | Freq: Every evening | ORAL | 0 refills | Status: DC | PRN

## 2023-01-15 MED ORDER — DULOXETINE HCL 30 MG PO CPEP: ORAL_CAPSULE | ORAL | 0 refills | Status: DC
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
## 2023-01-16 ENCOUNTER — Other Ambulatory Visit (HOSPITAL_COMMUNITY): Payer: Self-pay | Admitting: *Deleted

## 2023-01-16 DIAGNOSIS — F5105 Insomnia due to other mental disorder: Secondary | ICD-10-CM

## 2023-02-04 ENCOUNTER — Ambulatory Visit (INDEPENDENT_AMBULATORY_CARE_PROVIDER_SITE_OTHER): Payer: BC Managed Care – PPO | Admitting: Clinical

## 2023-02-04 ENCOUNTER — Encounter (HOSPITAL_COMMUNITY): Payer: Self-pay | Admitting: Clinical

## 2023-02-04 ENCOUNTER — Encounter (HOSPITAL_COMMUNITY): Payer: Self-pay

## 2023-02-04 DIAGNOSIS — F411 Generalized anxiety disorder: Secondary | ICD-10-CM

## 2023-02-04 DIAGNOSIS — F332 Major depressive disorder, recurrent severe without psychotic features: Secondary | ICD-10-CM

## 2023-02-04 DIAGNOSIS — Z630 Problems in relationship with spouse or partner: Secondary | ICD-10-CM | POA: Diagnosis not present

## 2023-02-04 DIAGNOSIS — F5105 Insomnia due to other mental disorder: Secondary | ICD-10-CM

## 2023-02-04 NOTE — Progress Notes (Signed)
Comprehensive Clinical Assessment (CCA) Note  02/04/2023 Cory Gomez [id number]  Chief Complaint:  Chief Complaint  Patient presents with   Establish Care   Anxiety   Depression   Visit Diagnosis:   Name Primary?   Severe episode of recurrent major depressive disorder, without psychotic features ([place]) (F33.2) Yes   GAD (generalized anxiety disorder) (F41.1)    Insomnia due to mental disorder (F51.05)    Relationship problem between partners (Z63.0)      CCA Biopsychosocial Intake/Chief Complaint:  Patient is a 42yo male who is financially stressed because wife cannot keep a job, and therefore he has to make all the money.  "Because of her, we got kicked out of an apartment about 3 years ago.  She does not understand she needs to contribute.  She has had 55 jobs in the 5 years we have been together, she says she ges fired but a lot is that she does not want to work."  This stresses him out because now he has to figure out what bills to not pay in order to get food.  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  He has been at the current Architect job for 8 months, previously was working an Librarian, academic job at [employer] but was sick/hospitalized and they were not able to keep his job waiting because he was not eligible for [employer].  The stress of everything negative going on in the world is more than he can handle, cannot discuss politics at all.  He likes peace and quiet, so he keeps to himself.  His wife wants to go out to do things  but she does not understand they don't have money to go out.  His parents are supportive but he does not want to enlist their help in these things.  The patient was married once before and divorced, does not have any children.  He states during this assessment that he is "not happy, really just miserable."  He is eager to talk about this in therapy.  Today his PHQ-9 score is 19 and his  GAD-7 score is 18.  This was shared with him, as well as the fact that these indicate high depression and anxiety, with which he agreed.  He believes that he has some social issues because he cannot read people or situations very well, for instance cannot tell if someone is telling a joke.  He describes having great difficulty with sleep.  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  Also he can't tell when people are joking, which he has tried to work on  [place] Symptoms Depression:   Change in energy/activity; Fatigue; Hopelessness; Increase/decrease in appetite; Irritability; Sleep (too much or little); Tearfulness; Weight gain/loss; Worthlessness   Duration of Depressive symptoms:  Greater than two weeks  Mania:   Racing thoughts   Anxiety:    Difficulty concentrating; Fatigue; Irritability; Restlessness; Sleep; Tension; Worrying   Psychosis:   None   Duration of Psychotic symptoms: No data recorded  Trauma:   None   Obsessions:   None   Compulsions:   None   Inattention:   Disorganized; Poor follow-through on tasks; Loses things   Hyperactivity/Impulsivity:   None   Oppositional/Defiant Behaviors:   None   Emotional Irregularity:   None   Other Mood/Personality Symptoms:  No data recorded   Mental Status Exam Appearance and self-care  Stature:    Average   Weight:   Overweight   Clothing:   Casual   Grooming:   Normal   Cosmetic use:   None   Posture/gait:   Normal   Motor activity:   Not Remarkable   Sensorium  Attention:   Normal   Concentration:   Normal   Orientation:   X5   Recall/memory:   Normal   Affect and Mood  Affect:   Blunted   Mood:   Dysphoric   Relating  Eye contact:   Normal   Facial expression:   Tense; Sad   Attitude toward examiner:   Cooperative   Thought and Language  Speech flow:  Normal   Thought content:   Appropriate to Mood and Circumstances   Preoccupation:   Ruminations   Hallucinations:   None   Organization:  No data recorded  [employer] of Knowledge:   Average   Intelligence:   Average   Abstraction:   Normal   Judgement:   Common-sensical   Reality Testing:   Adequate   Insight:   Good   Decision Making:   Normal   Social Functioning  Social Maturity:   Isolates   Social Judgement:   Normal   Stress  Stressors:   Relationship; Work; Teacher, music Ability:   Deficient supports   Skill Deficits:   Interpersonal   Supports:   Family (Does not feel wife is supportive)    Religion: Religion/Spirituality Are You A Religious Person?: Yes What is Your Religious Affiliation?: Christian How Might This Affect Treatment?: Previous pastor broke his confidence by revealing a confidence to his wife.  Goes to church occasionally, but isolates there and will not interact.  Leisure/Recreation: Leisure / Recreation Do You Have Hobbies?: No Leisure and Hobbies: Trying to find something interesting to do that is not too expensive.  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, counselling and sprinklers How Long has Patient Been Employed?: 8 months Are You Satisfied With Your Job?: Yes Do You Work More Than One Job?: No Work Stressors: Is trying to find something Patient's Job has Been Impacted by Current Illness: Yes Describe how Patient's Job has Been Impacted: Does not feel like working, only feels like moping around and being left alone What is the Longest Time Patient has Held a Job?: 13-14 years 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, academic support  engineer Has Patient ever Been in the [employer]?: No  Education: Education Is Patient Currently Attending School?: No Last Grade Completed: 12 Name of High School: [employer] Did You Graduate From [employer]?: Yes Did You Attend College?: Yes What Type of College Degree Do you Have?: none -tech school but did not finish Did [place]?: No Did You Have Any Special Interests In School?: None Did You Have An Individualized Education Program (IIEP): Yes Did You Have Any Difficulty At School?: Yes Were Any Medications Ever Prescribed For These Difficulties?: Yes Medications Prescribed For School Difficulties?: unsure - for ADHD Patient's Education Has Been Impacted by Current Illness: No   CCA Family/Childhood History Family and Relationship History: Family history Marital status: Married Number of Years Married: 5 What types of issues is patient dealing with in the relationship?: Wife does not see the problem in their relationship, and she refuses to go to therapy.  She has had 55 jobs in their 5 years of marriage.  He feels unsupported, as she constantly wants  to go out and spend money.  She does not keep the house clean so he has to do that on top of working full-time.  Wife has had a lot of medical problems although he is not clear on what and does not know what to believe.  She refuses to sit down and talk about their problems or go to marriage counseling. Additional relationship information: married before, divorced Are you sexually active?: Yes What is your sexual orientation?: straight Does patient have children?: No  Childhood History:  Childhood History By whom was/is the patient raised?: Mother/father and step-parent Additional childhood history information: Parents divorced when patient was in 2nd grade.  He felt it was his fault for misbehaving or being too much trouble.  They had a difficult custody battle.  Mother remarried about 4 years later and stepfather has always regarded him as his own son, so he feels he is his actual father. Description of patient's relationship with caregiver when they were a child: Mother - got primary custody when parents split up, had to work 2 jobs, so as the oldest brother he had to take care of the younger brothers; Father - would go to see him every other weekend and 2 weeks in summer, father sometimes would go drink with his buddies instead of coming to get the juds; Stepfather - came into his life around 11-12yo and treated them as his own sons Patient's description of current relationship with people who raised him/her: Father is deceased (Thanksgiving 2009); Mother - at odds on some things such as his brother being gay (he thinks it is a choice and she thinks he was born that way); Stepfather - pretty good, rides to work with him and they do things together How were you disciplined when you got in trouble as a child/adolescent?: grounded, spanked only once in his life, had a healthy fear of his parents Does patient have siblings?: Yes Number of Siblings: 4 Description of patient's current relationship with  siblings: very good with both brothers, sees them often; okay with step-sisters, does not see often Did patient suffer any verbal/emotional/physical/sexual abuse as a child?: No Did patient suffer from severe childhood neglect?: No Has patient ever been sexually abused/assaulted/raped as an adolescent or adult?: No Was the patient ever a victim of a crime or a disaster?: No Witnessed domestic violence?: No Has patient been affected by domestic violence as an adult?: No  CCA Substance  Use Alcohol/Drug Use: Alcohol / Drug Use Pain Medications: None Prescriptions: Yes, see medication list Over the Counter: Just tylenol occasionally History of alcohol / drug use?: No history of alcohol / drug abuse   Substance use Disorder (SUD)  None  Recommendations for Services/Supports/Treatments: Recommendations for Services/Supports/Treatments Recommendations For Services/Supports/Treatments: Individual Therapy, Medication Management  DSM5 Diagnoses: Patient Active Problem List   Diagnosis Date Noted   Bilateral impacted cerumen 12/17/2022   Hypertension 01/11/2022   Sinusitis 12/30/2021   Morbid obesity with BMI of 40.0-44.9, adult ([place]) 07/17/2021   Hyperlipidemia 01/13/2020   Acne 09/09/2019   GAD (generalized anxiety disorder) 08/17/2019   ADD (attention deficit disorder) 07/24/2019   Binge eating disorder 07/24/2019   Vitamin D deficiency 06/11/2019   Prediabetes    Major depressive disorder, recurrent episode, in partial remission [facility])     Patient Centered Plan: Patient is on the following Treatment Plan(s):  Anxiety and Depression and Insomnia  Problem: Anxiety   Goal: LTG: Cory "[name]" will score less than 5 on the Generalized Anxiety Disorder 7 Scale (GAD-7)    Goal: STG: Cory "[name]" will reduce frequency of avoidant behaviors by 50% as evidenced by self-report in therapy sessions   Intervention: Work with Cory "[name]" to identify a minimum of 3 consequences of avoidance.    Intervention: Work with Cory "[name]" to identify a minimum of 3 alternative coping behaviors to avoidance.    Intervention: Continue cognitive-behavioral therapy for anxiety and depression    Problem: OP Depression   Goal: LTG: Reduce frequency, intensity, and duration of depression symptoms so that daily functioning is improved   Goal: STG: Reduce overall depression score to no more than 9 on the Patient Health Questionnaire (PHQ-9)   Intervention: Therapist will educate patient on cognitive distortions and the rationale for treatment of depression   Intervention: Cory "[name]" will identify 5 cognitive distortions they are currently using and write reframing statements to replace them   Intervention: Cory "[name]" will review pleasant activities list and select 2 activities to practice weekly for the next 26 weeks    Problem: Insomnia   Goal: LTG: Cory "[name]" will demonstrate improvement in quality and duration of sleep as measured by self-report of more consistent hours slept   Goal: STG: Cory "[name]" will identify and try 4-5 non-medication sleep hygiene aids that they feel help them with sleep (earplugs, music, breathing, progressive relaxation, etc.)   Intervention: Educate Cory "[name]" on non-medication sleep hygiene aids that they may try (earplugs, music breathing, progressive relaxation, etc.)     Referrals to Alternative Service(s): Referred to Alternative Service(s):  Not applicable Place:   Date:   Time:      Collaboration of Care: Psychiatrist AEB psychiatric provider can see therapy notes, and psychiatric note was read prior to session  Patient/Guardian was advised Release of Information must be obtained prior to any record release in order to collaborate their care with an outside provider. Patient/Guardian was advised if they have not already done so to contact the registration department to sign all necessary forms in order for [place] to release information regarding their care.   Consent:  Patient/Guardian gives verbal consent for treatment and assignment of benefits for services provided during this visit. Patient/Guardian expressed understanding and agreed to proceed.   Recommendations:  Return to therapy in 2 weeks, engage in self care behaviors, plan positive social engagements, focus on overall work/home life balance, implement 1 new coping skill as taught in session ("I" statements), and return to  next session prepared to talk about experience with that new coping method.   Maretta Los, LCSW

## 2023-02-15 ENCOUNTER — Telehealth (HOSPITAL_BASED_OUTPATIENT_CLINIC_OR_DEPARTMENT_OTHER): Payer: BC Managed Care – PPO | Admitting: Psychiatry

## 2023-02-15 ENCOUNTER — Encounter (HOSPITAL_COMMUNITY): Payer: Self-pay | Admitting: Psychiatry

## 2023-02-15 ENCOUNTER — Other Ambulatory Visit (HOSPITAL_BASED_OUTPATIENT_CLINIC_OR_DEPARTMENT_OTHER): Payer: Self-pay

## 2023-02-15 ENCOUNTER — Other Ambulatory Visit: Payer: Self-pay

## 2023-02-15 DIAGNOSIS — F411 Generalized anxiety disorder: Secondary | ICD-10-CM

## 2023-02-15 DIAGNOSIS — F330 Major depressive disorder, recurrent, mild: Secondary | ICD-10-CM | POA: Diagnosis not present

## 2023-02-15 DIAGNOSIS — F5105 Insomnia due to other mental disorder: Secondary | ICD-10-CM | POA: Diagnosis not present

## 2023-02-15 MED ORDER — DULOXETINE HCL 30 MG PO CPEP
90.0000 mg | ORAL_CAPSULE | Freq: Every day | ORAL | 2 refills | Status: DC
  Filled 2023-02-15: qty 90, 30d supply, fill #0
  Filled 2023-03-18: qty 90, 30d supply, fill #1
  Filled 2023-04-16: qty 90, 30d supply, fill #2

## 2023-02-15 MED ORDER — BUPROPION HCL ER (XL) 150 MG PO TB24
150.0000 mg | ORAL_TABLET | ORAL | 2 refills | Status: DC
  Filled 2023-02-15 – 2023-03-18 (×2): qty 30, 30d supply, fill #0
  Filled 2023-04-16 – 2023-04-26 (×4): qty 30, 30d supply, fill #1

## 2023-02-15 MED ORDER — ESZOPICLONE 2 MG PO TABS
2.0000 mg | ORAL_TABLET | Freq: Every evening | ORAL | 1 refills | Status: DC | PRN
  Filled 2023-02-15: qty 30, 30d supply, fill #0

## 2023-02-15 NOTE — Progress Notes (Signed)
BH MD/PA/NP OP Progress Note  02/15/2023 10:02 AM [name]  MRN:  [id number]  Visit Diagnosis:    ICD-10-CM   1. Major depressive disorder, recurrent episode, mild (HCC)  F33.0     2. GAD (generalized anxiety disorder)  F41.1     3. Insomnia due to mental disorder  F51.05       [name] is a 42 y.o. male with a history of MDD, GAD, ADHD, binge eating disorder, and Vit D deficiency who presented to [place] at [facility] for initial evaluation on 01/15/2023.  At initial evaluation patient reported neurovegetative symptoms of depression including low mood, anhedonia, amotivation, worthlessness, insomnia, increased appetite, and occasional passive SI.  He denied any intent or plan.  Patient was able to complete a safety plan and lists his mother as a good support.  Patient furthermore endorsed symptoms of anxiety including feeling nervous or on edge, being unable to stop or control his worrying, difficulty relaxing, increased irritability, restlessness, and fears that something awful might happen.  Psychosocially patient has increased stress from financial struggles, difficulty at work, and interpersonal struggles in his marriage.  Patient meets criteria for MDD and generalized anxiety disorder with further clarity needed to rule out ADHD.  He was referred for neuropsych testing.  Of note due to patient's sleep troubles and body habitus there is some concern for sleep apnea and he was referred to a sleep specialist.  Cory Gomez presents for follow-up evaluation. Today, 02/15/23, patient reports an improvement in his anxiety, suicidality, and energy since starting medication.  Sleep however continues to be an issue with more sleeping around 4 hours a night.  He denies any adverse side effects from medications.  We will continue on Cymbalta and Wellbutrin and discontinue Ambien.  Start Lunesta 2 mg at bedtime and risk and benefits  were discussed.  Patient also notes he has a sleep study coming up in the next 2 months.  He has connected with a therapist and plans to continue monthly.  Plan: - Continue Cymbalta 90 mg QD - Continue Wellbutrin XL 150 mg QD - Start Lunesta 2 mg QHS - Discontinue Ambien 10 mg QHS prn for insomnia, PDMP reviewed - Neuropsych referral - Sleep specialist appointment in 2 months - Continue therapy with [facility] once a month - Crisis resources reviewed - Follow up in 4-6 weeks    Chief Complaint:  Chief Complaint  Patient presents with   Follow-up   HPI: Cory Gomez presents reporting that there has been some slight improvement in his symptoms over the past month.  He notes that his anxiety is a lot lower then it used to be and he has more energy now.  He also reports that the suicidal thoughts he had been experiencing have discontinued since starting the medication.  He denies any notable adverse side effects from any of the medications.  She does reports that he still does struggle with sleep.  He is taking Ambien 10 mg and will fall asleep for about 4 hours before suddenly waking up.  He notes getting up to go to the bathroom but being unable to fall back asleep.  We discussed reasons for waking up and patient denies any nightmares.  He does note that he is scheduled for a sleep study in the next 2 months.  Medication options for sleep were also reviewed and patient has had poor response from trazodone in the past.  As he  is already on 2 antidepressant medications we will hold off on starting a third such as mirtazapine or doxepin.  Instead we will discontinue Ambien and start Lunesta.  Risk and benefits of this medication were discussed.  Patient also reports that he still does struggle to understand people's tone/how they are coming across.  At times he can get defensive due to this.  She had did connect with therapy and felt it was a good fit.  He notes that he plans to work on this  topic through therapy.   Past Psychiatric History: First episode of depression was in early elementary school after his parents divorced. He was in counseling and prescribed bupropion then. Later, as an adult, he was briefly on fluoxetine with no clear benefit. While in school he was diagnosed with having ADHD inattentive type and was on Ritalin. He had seen Dr. Montel Culver from 2020-2022. He has no hx of inpatient psychiatric admissions, no hx of mania or overt psychosis. Denies any prior suicide attempts.   Has tried Adderall, Vyvanse, Ritalin, bupropion, Prozac, Zoloft, Cymbalta, trazodone, Zolpidem, topirimate, Lunesta   He does not abuse alcohol or street drugs.  Past Medical History:  Past Medical History:  Diagnosis Date   ADHD (attention deficit hyperactivity disorder)    Annual physical exam 09/09/2019   Anxiety and depression    COVID-19    12/28/19   Depression, recurrent ([place]) 09/09/2019   Elevated liver enzymes 06/11/2019   Excessive ear wax, left 09/09/2019   Headache 12/31/2021   Hypertriglyceridemia 07/17/2021   Insomnia    Insomnia    Inverted nipple 09/16/2019   Mastoiditis of both sides    Nystagmus 01/22/2022   Obesity (BMI 30-39.9)    Obesity (BMI 30-39.9)    Prediabetes    Suicidal ideation 07/14/2021   Vertigo     Past Surgical History:  Procedure Laterality Date   NO PAST SURGERIES      Family History:  Family History  Problem Relation Age of Onset   Diabetes Mother    Hypertension Mother    Hyperlipidemia Mother    Diabetes Mellitus II Mother    Anxiety disorder Mother    Depression Mother    Stroke Father    Alcoholism Father    Anxiety disorder Brother    Depression Brother    Hypertension Brother    Hyperlipidemia Brother    Diabetes type II Brother    ADD / ADHD Brother    Dementia Maternal Grandfather     Social History:  Social History   Socioeconomic History   Marital status: Married    Spouse name: Not on file   Number of  children: Not on file   Years of education: Not on file   Highest education level: Not on file  Occupational History   Not on file  Tobacco Use   Smoking status: Never   Smokeless tobacco: Never  Vaping Use   Vaping Use: Never used  Substance and Sexual Activity   Alcohol use: Not Currently   Drug use: Not Currently   Sexual activity: Yes    Partners: Female  Other Topics Concern   Not on file  Social History Narrative   Married no kids wife is Karry Gaebler    -wife and mother [name] on [place]      Has siblings    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 30 tablet 1   Cholecalciferol 1.25 MG (50000 UT) capsule Take 1 capsule (50,000 Units total) by mouth once a week. 13 capsule 1   DULoxetine (CYMBALTA) 30 MG capsule Take 1 capsule (30 mg total) by mouth daily for 7 days, THEN 2 capsules (60 mg total) daily for 7 days, THEN 3 capsules (90 mg total) daily for 21 days. 75 capsule 0   Loratadine 5 MG TBDP Take 1 tablet (5 mg total) by mouth daily. 60 tablet 1   mometasone (NASONEX) 50 MCG/ACT nasal spray Place 2 sprays into the nose daily. 1 each 12   zolpidem (AMBIEN) 10 MG tablet Take 1 tablet (10 mg total) by mouth at bedtime as needed for sleep. 30 tablet 0   No current facility-administered medications for this visit.     Psychiatric Specialty Exam: Review of Systems  There were no vitals taken for this visit.There is no height or weight on file to calculate BMI.  General Appearance: Well Groomed  Eye Contact:  Good   Speech:  Clear and Coherent and Normal Rate  Volume:  Normal  Mood:  Euthymic  Affect:  Congruent  Thought Process:  Coherent and Goal Directed  Orientation:  Full (Time, Place, and Person)  Thought Content: Logical   Suicidal Thoughts:  No  Homicidal Thoughts:  No  Memory:  NA  Judgement:  Fair  Insight:  Fair  Psychomotor Activity:  Normal  Concentration:  Concentration: Good  Recall:  Good  Fund of Knowledge: Fair  Language: Good  Akathisia:  NA    AIMS (if indicated): not done  Assets:  Proofreader Vocational/Educational  ADL's:  Intact  Cognition: WNL  Sleep:  Fair   Metabolic Disorder Labs: Lab Results  Component Value Date   HGBA1C 6.0 06/14/2021   No results found for: "PROLACTIN" Lab Results  Component Value Date   CHOL 199 06/14/2021   TRIG 227.0 (H) 06/14/2021   HDL 27.90 (L) 06/14/2021   CHOLHDL 7 06/14/2021   VLDL 45.4 (H) 06/14/2021   LDLCALC 102 (H) 06/11/2019   Lab Results  Component Value Date   TSH 2.91 06/14/2021   TSH 2.55 06/14/2020    Therapeutic Level Labs: No results found for: "LITHIUM" No results found for: "VALPROATE" No results found for: "CBMZ"   Screenings: GAD-7    [place] Office Visit from 01/15/2023 in [facility] [place] [facility] Visit from 12/03/2022 in [place] at [facility] Visit from 01/11/2022 in [place] at [facility] Visit from 07/14/2021 in [place] at [facility] Visit from 09/08/2019 in [place] at [facility]  Total GAD-7 Score '18 20 12 18 20      '$ [place] Office Visit from 01/15/2023 in [place] [facility] Office Visit from 12/03/2022 in [place] at [facility] Visit from 01/11/2022 in [place] at [facility] Visit from 07/14/2021 in [place] at [facility]  Visit from 01/13/2020 in [place] at [facility] Total Score '5 5 4 5 '$ 0  PHQ-9 Total Score '19 23 14 21 '$ --      [place] Office Visit from 01/15/2023 in [place] [facility] ED from 11/25/2022 in [facility] at [place] Visit from 01/11/2022 in [place] at [place] No Risk No Risk No Risk       Collaboration of Care: Collaboration of Care: Medication Management AEB medication prescription and Referral or follow-up with counselor/therapist AEB chart review  Patient/Guardian was advised Release of Information must be obtained prior to any record release in order to collaborate their care with an outside provider. Patient/Guardian was advised if they have not already done so to contact the registration department to sign all necessary forms in order for [place] to release information regarding their care.   Consent: Patient/Guardian gives verbal consent for treatment and assignment of benefits for services provided during this visit. Patient/Guardian expressed understanding and agreed to proceed.    Vista Mink, MD 02/15/2023, 10:02 AM   Virtual Visit via Video Note  I connected with Veneta Penton on 02/15/23 at 10:00 AM EST by a video enabled telemedicine application and verified that I am speaking with the correct person using two identifiers.  Location: Patient: Home Provider: Home Office   I discussed the limitations of evaluation and management by telemedicine and the availability of in person appointments. The patient expressed understanding and agreed to proceed.   I discussed the assessment and treatment plan with the patient. The patient was provided an opportunity to ask questions and all were answered.  The patient agreed with the plan and demonstrated an understanding of the instructions.   The patient was advised to call back or seek an in-person evaluation if the symptoms worsen or if the condition fails to improve as anticipated.  I provided 15 minutes of non-face-to-face time during this encounter.   Vista Mink, MD

## 2023-02-18 ENCOUNTER — Other Ambulatory Visit: Payer: Self-pay

## 2023-02-20 ENCOUNTER — Ambulatory Visit (INDEPENDENT_AMBULATORY_CARE_PROVIDER_SITE_OTHER): Payer: BC Managed Care – PPO | Admitting: Clinical

## 2023-02-20 ENCOUNTER — Encounter (HOSPITAL_COMMUNITY): Payer: Self-pay | Admitting: Clinical

## 2023-02-20 DIAGNOSIS — Z630 Problems in relationship with spouse or partner: Secondary | ICD-10-CM | POA: Diagnosis not present

## 2023-02-20 DIAGNOSIS — F411 Generalized anxiety disorder: Secondary | ICD-10-CM | POA: Diagnosis not present

## 2023-02-20 DIAGNOSIS — F5105 Insomnia due to other mental disorder: Secondary | ICD-10-CM | POA: Diagnosis not present

## 2023-02-20 DIAGNOSIS — F332 Major depressive disorder, recurrent severe without psychotic features: Secondary | ICD-10-CM

## 2023-02-20 NOTE — Progress Notes (Signed)
THERAPIST PROGRESS NOTE  Session Time: 10:28am-11:00am  Session #2  Participation Level: Active  Virtual Visit via Video Note  I connected with Cory Gomez on 02/20/23 at 10:00 AM EST by a video enabled telemedicine application and verified that I am speaking with the correct person using two identifiers.  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 The patient expressed understanding and agreed to proceed.   I discussed the assessment and treatment plan with the patient. The patient was provided an opportunity to ask questions and all were answered. The patient agreed with the plan and demonstrated an understanding of the instructions.   The patient was advised to call back or seek an in-person evaluation if the symptoms worsen or if the condition fails to improve as anticipated.  I provided 32 minutes of non-face-to-face time during this encounter.   Maretta Los, LCSW   Behavioral Response: CasualAlertAnxious  Type of Therapy: Individual Therapy  Treatment Goals addressed:  LTG: Cory "[name]" will score less than 5 on the Generalized Anxiety Disorder 7 Scale (GAD-7)    STG: Cory "[name]" will reduce frequency of avoidant behaviors by 50% as evidenced by self-report in therapy sessions LTG: Reduce frequency, intensity, and duration of depression symptoms so that daily functioning is improved   STG: Reduce overall depression score to no more than 9 on the Patient Health Questionnaire (PHQ-9)   LTG: Cory "[name]" will demonstrate improvement in quality and duration of sleep as measured by self-report of more consistent hours slept   STG: Cory "[name]" will identify and try 4-5 non-medication sleep hygiene aids that they feel help them with sleep (earplugs, music, breathing, progressive relaxation, etc.)    ProgressTowards Goals: Progressing  Interventions: CBT and Supportive  Summary: Cory Gomez is a 42 y.o. male who presents with MDD/severe, GAD, insomnia, and relationship problems with his partner.  He forgot the appointment so it was switched to a virtual appointment, then he had a hard time connecting.  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  He stated that things in his life have improved slightly and his stress is down somewhat.  He stated that he is "trying not to worry about things I can't control."  He stated that at home sometimes he just has to get away from his wife by finding a quiet place to read, but instead will just sit doing nothing, enjoying the peace.  His dog is also an emotional support to him.  He stated that he is trying to put issues that arise with his wife in the context of whether he can control them or not, which we had talked about last time.  If he cannot control it, he is trying to just let it go.  CSW introduced him to the idea of Thought Stopping and using a fun song as a segue.  CSW provided him a handout and asked him to practice this until the next appointment.  He also stated he has been using the "I" statements discussed at first visit, but his wife still seems to think he is mad at her.  CSW ensured him it is okay to tell her preemptively that he is not, modeled some of this for  him and he stated he will try it.  Furthermore, after he drives 1 hr, 20 min to work, works his 8-hour day, then drives 1 hr, 20 min home his wife is annoyed that he does not want to immediately spend time with her.  We talked about him giving her a specific time that he will get together with her.  If he gets home at 5:00pm for instance, he can say, I need 15-20 minutes to just calm myself down from the day but I will get with you at 5:20, okay?  He liked  this idea and agreed to try it.  He stated he feels like the medications are really working well and have made "a big difference."  Suicidal/Homicidal: No  Therapist Response: Patient is making progress by applying the principles taught.  He does have a good bit of social awkwardness as he mentioned in first session.  He is eager to follow through on the skills he is taught, and it will be much easier to teach these in person, as even on the video chat he was outdoors and distracted.  Although he was in a completely private place, he looked around often at noises being made.  He stated he believes the medicines are starting to work, is excited about this.  Recommendations:  Return to therapy in 2 weeks, engage in self care behaviors, plan positive social engagements, focus on overall work/home life balance, implement 1 new coping skill as taught in session, and return to next session prepared to talk about experience with that new coping method.   Plan: Return again in 2 weeks.  Diagnosis:  Severe episode of recurrent major depressive disorder, without psychotic features (HCC) (F33.2) GAD (generalized anxiety disorder) (F41.1) Relationship problem between partners (Z63.0) Insomnia due to mental disorder (F51.05)  Collaboration of Care: Psychiatrist AEB - psychiatric provider can read therapy notes, and vice versa  Patient/Guardian was advised Release of Information must be obtained prior to any record release in order to collaborate their care with an outside provider. Patient/Guardian was advised if they have not already done so to contact the registration department to sign all necessary forms in order for [place] to release information regarding their care.   Consent: Patient/Guardian gives verbal consent for treatment and assignment of benefits for services provided during this visit. Patient/Guardian expressed understanding and agreed to proceed.   Maretta Los, LCSW 02/20/2023

## 2023-02-28 ENCOUNTER — Ambulatory Visit (HOSPITAL_COMMUNITY): Payer: BC Managed Care – PPO | Admitting: Clinical

## 2023-03-11 ENCOUNTER — Ambulatory Visit (HOSPITAL_COMMUNITY): Payer: BC Managed Care – PPO | Admitting: Clinical

## 2023-03-11 NOTE — Patient Instructions (Incomplete)
It was a pleasure meeting you today. Thank you for allowing me to take part in your health care.  Our goals for today as we discussed include:  Your blood pressure is elevated today.  Repeat was normal. Please monitor your blood pressure at home.  Goal less than 130/80.   Please schedule appointment for annual physical in the next 2 to 3 months.  Schedule lab appointment 1 week prior to physical.  Will need to fast for 10 hours.  Follow-up with therapy and psychiatrist as scheduled.   If you have any questions or concerns, please do not hesitate to call the office at [phone].  I look forward to our next visit and until then take care and stay safe.  Regards,   Carollee Leitz, MD   [facility]

## 2023-03-11 NOTE — Progress Notes (Unsigned)
   SUBJECTIVE:  No chief complaint on file.  HPI Patient presents to clinic to transfer care.  Hypertension  Mood disorder Establish care with Dr. Nelida Gores at behavioral health.  Currently taking Wellbutrin XL 150 mg daily and duloxetine 90 mg daily.  Denies any SI/HI.***Has also been proactive in seeing therapist.  Sleep disorder Takes Lunesta 2 mg nightly.  Managed by Dr. Nelida Gores of behavioral health.  Hyperlipidemia    PERTINENT PMH / PSH: Mood disorder Hypertension Prediabetes ADD Hyperlipidemia Obesity class III   OBJECTIVE:  There were no vitals taken for this visit.   Physical Exam  ASSESSMENT/PLAN:  There are no diagnoses linked to this encounter. PDMP reviewed***  No follow-ups on file.  Carollee Leitz, MD

## 2023-03-12 ENCOUNTER — Encounter: Payer: Self-pay | Admitting: Family Medicine

## 2023-03-12 ENCOUNTER — Ambulatory Visit (INDEPENDENT_AMBULATORY_CARE_PROVIDER_SITE_OTHER): Payer: BC Managed Care – PPO | Admitting: Family Medicine

## 2023-03-12 DIAGNOSIS — I10 Essential (primary) hypertension: Secondary | ICD-10-CM

## 2023-03-12 DIAGNOSIS — E785 Hyperlipidemia, unspecified: Secondary | ICD-10-CM | POA: Diagnosis not present

## 2023-03-12 DIAGNOSIS — E559 Vitamin D deficiency, unspecified: Secondary | ICD-10-CM | POA: Diagnosis not present

## 2023-03-12 DIAGNOSIS — Z6841 Body Mass Index (BMI) 40.0 and over, adult: Secondary | ICD-10-CM | POA: Diagnosis not present

## 2023-03-12 DIAGNOSIS — R7303 Prediabetes: Secondary | ICD-10-CM

## 2023-03-12 NOTE — Assessment & Plan Note (Signed)
Chronic.  Asymptomatic.  Initially elevated.  Repeat at goal.   Continue to monitor blood pressure at home.  Goal less than 130/80 Discussed with patient that may need to restart antihypertensive in future, agreeable Limit sodium intake Increase activity and weight management to help lower BP Strict return precautions provided.

## 2023-03-13 ENCOUNTER — Other Ambulatory Visit (HOSPITAL_COMMUNITY): Payer: Self-pay | Admitting: Psychiatry

## 2023-03-13 DIAGNOSIS — F330 Major depressive disorder, recurrent, mild: Secondary | ICD-10-CM

## 2023-03-18 ENCOUNTER — Encounter (HOSPITAL_COMMUNITY): Payer: Self-pay

## 2023-03-18 ENCOUNTER — Other Ambulatory Visit: Payer: Self-pay

## 2023-03-18 ENCOUNTER — Other Ambulatory Visit (HOSPITAL_COMMUNITY): Payer: Self-pay

## 2023-03-19 ENCOUNTER — Other Ambulatory Visit (HOSPITAL_COMMUNITY): Payer: Self-pay

## 2023-03-19 ENCOUNTER — Encounter (HOSPITAL_COMMUNITY): Payer: Self-pay

## 2023-03-19 ENCOUNTER — Encounter (HOSPITAL_COMMUNITY): Payer: BC Managed Care – PPO | Admitting: Psychiatry

## 2023-03-19 NOTE — Progress Notes (Signed)
This encounter was created in error - please disregard.

## 2023-03-25 ENCOUNTER — Ambulatory Visit (INDEPENDENT_AMBULATORY_CARE_PROVIDER_SITE_OTHER): Payer: BC Managed Care – PPO | Admitting: Clinical

## 2023-03-25 ENCOUNTER — Encounter (HOSPITAL_COMMUNITY): Payer: Self-pay | Admitting: Clinical

## 2023-03-25 DIAGNOSIS — F411 Generalized anxiety disorder: Secondary | ICD-10-CM

## 2023-03-25 DIAGNOSIS — F332 Major depressive disorder, recurrent severe without psychotic features: Secondary | ICD-10-CM | POA: Diagnosis not present

## 2023-03-25 DIAGNOSIS — F5105 Insomnia due to other mental disorder: Secondary | ICD-10-CM

## 2023-03-25 NOTE — Progress Notes (Signed)
THERAPIST PROGRESS NOTE  Session Time: [id number]  Session #3  Participation Level: Active   Behavioral Response: Casual Alert Anxious  Type of Therapy: Individual Therapy  Treatment Goals addressed:  LTG: Jazper "[name]" will score less than 5 on the Generalized Anxiety Disorder 7 Scale (GAD-7)    STG: Ronelle "[name]" will reduce frequency of avoidant behaviors by 50% as evidenced by self-report in therapy sessions LTG: Reduce frequency, intensity, and duration of depression symptoms so that daily functioning is improved   STG: Reduce overall depression score to no more than 9 on the Patient Health Questionnaire (PHQ-9)   LTG: Antwan "[name]" will demonstrate improvement in quality and duration of sleep as measured by self-report of more consistent hours slept   STG: Terrell "[name]" will identify and try 4-5 non-medication sleep hygiene aids that they feel help them with sleep (earplugs, music, breathing, progressive relaxation, etc.)   ProgressTowards Goals: Progressing  Interventions: CBT and Supportive  Summary: Dionne Wilshire is a 42 y.o. male who presents with MDD/severe, GAD, insomnia, and relationship problems with his partner.  He stated he continues to feel he has improved a great deal in letting go of the things he cannot control.  He keeps reminding himself that he cannot change other people and he cannot please everyone.  He acknowledged that he has lived much of his life as a people pleaser, saying this originated in childhood when his parents divorced and as the older brother he felt it necessary to take care of his younger brothers.  He feels the medication he is on is working very well and stated his sleep is much improved since he stopped Ambien and started taking Lunesta.  He is no longer having vivid, life-like dreams.  He stated his mother has commented that she is seeing "the old [name] come back."  He smiled broadly at this and many other times during session.  We discussed  the way anxiety causes many people to start avoidant behaviors which, when they work well to reduce anxiety, often lead people to avoid more and more things in life.  He stated he has been talking to his brain, challenging his thoughts and "slowly shutting my brain down."  CSW explained he is redirecting his brain not stopping it.  He said right now it is only successful about 2% of the time but he still sees that as progress.  Cognitive behavioral concepts were explained and Thinking Traps handout was printed for the patient.  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  He referred multiple times to the way in which his brain tends to go straight to "fight or flight" in which case he is always going to fight.  CSW explained how looking back at past events and how his thoughts led to his feelings which led to his actions can help him to challenge his future thoughts, thus feelings, and lead to different actions.  He agreed to refer to this handout throughout the coming weeks and to engage in "thinking about his thinking."  Suicidal/Homicidal: No  Therapist Response: Patient is making progress and feels the medicine and therapy are working together to improve his mood, sleep, and mindset.  CSW provided mood monitoring and treatment progress review in the context of this episode of treatment.   Patient reported that his mood has been "improved quite a bit".   Patient was  able to explore how CBT concepts can positively affect his life.  CSW gave patient the opportunity to explore thoughts and feelings associated with current life situations and past/present external stressors as desired.   CSW encouraged patient's expression of feelings and validated patient's thoughts, using empathy, active listening, open body language, and unconditional positive regard.  Patient demonstrated an orientation to time, place,  person and situation.    Recommendations:  Return to therapy in 2 weeks, engage in self care behaviors, consider CBT particularly Thinking Traps as taught in session, and return to next session prepared to talk about this further  Plan: Return again in 2 weeks.  Diagnosis:  Encounter Diagnoses  Name Primary?   Severe episode of recurrent major depressive disorder, without psychotic features    GAD (generalized anxiety disorder) Yes   Insomnia due to mental disorder      Collaboration of Care: Psychiatrist AEB - talked with psychiatric provider about patient's sleep improving, reminded patient about upcoming psychiatry appointment date and time  Patient/Guardian was advised Release of Information must be obtained prior to any record release in order to collaborate their care with an outside provider. Patient/Guardian was advised if they have not already done so to contact the registration department to sign all necessary forms in order for [place] to release information regarding their care.   Consent: Patient/Guardian gives verbal consent for treatment and assignment of benefits for services provided during this visit. Patient/Guardian expressed understanding and agreed to proceed.   Maretta Los, LCSW 03/25/2023

## 2023-04-17 ENCOUNTER — Other Ambulatory Visit: Payer: Self-pay

## 2023-04-18 ENCOUNTER — Other Ambulatory Visit: Payer: Self-pay

## 2023-04-24 ENCOUNTER — Other Ambulatory Visit: Payer: Self-pay

## 2023-04-25 ENCOUNTER — Encounter (HOSPITAL_COMMUNITY): Payer: Self-pay | Admitting: Clinical

## 2023-04-25 ENCOUNTER — Ambulatory Visit (HOSPITAL_COMMUNITY): Payer: BC Managed Care – PPO | Admitting: Clinical

## 2023-04-25 ENCOUNTER — Other Ambulatory Visit (HOSPITAL_BASED_OUTPATIENT_CLINIC_OR_DEPARTMENT_OTHER): Payer: Self-pay

## 2023-04-25 DIAGNOSIS — F330 Major depressive disorder, recurrent, mild: Secondary | ICD-10-CM | POA: Diagnosis not present

## 2023-04-25 DIAGNOSIS — F411 Generalized anxiety disorder: Secondary | ICD-10-CM

## 2023-04-25 NOTE — Progress Notes (Signed)
THERAPIST PROGRESS NOTE  Session Time:  10:05-11:01am  Session #4  Participation Level: Active  Behavioral Response: Casual Alert Hopeless and Irritable  Type of Therapy: Individual Therapy  Treatment Goals addressed:  LTG: Cory "[name]" will score less than 5 on the Generalized Anxiety Disorder 7 Scale (GAD-7)    STG: Cory "[name]" will reduce frequency of avoidant behaviors by 50% as evidenced by self-report in therapy sessions LTG: Reduce frequency, intensity, and duration of depression symptoms so that daily functioning is improved   STG: Reduce overall depression score to no more than 9 on the Patient Health Questionnaire (PHQ-9)   LTG: Cory "[name]" will demonstrate improvement in quality and duration of sleep as measured by self-report of more consistent hours slept   STG: Cory "[name]" will identify and try 4-5 non-medication sleep hygiene aids that they feel help them with sleep (earplugs, music, breathing, progressive relaxation, etc.)   ProgressTowards Goals: Progressing  Interventions: Solution Focused, Assertiveness Training, and Supportive  Summary: Cory Gomez is a 42 y.o. male who presents with MDD/severe, GAD, insomnia, and relationship problems with his partner.  He presented in session pretty upset, stating his wife has quit her most recent job (which he thinks makes 76 or 63 during their almost-5 years of marriage).  As a result of this lost income, he has not been able to keep up with the bills and his car was repossessed.  He is not sure how many other things will be affected negatively, such as having a place to live.  She is supposed to start a new job on Monday, but it will be 2 weeks before she gets a paycheck.  When asked how he handled this news, he stated that he told his wife that he was really disappointed, but that did not seem to have any sort of impact on her.  He told her it was "the last time" but was not specific about what that statement means in  practice.  CSW asked about her diagnosis and treatments, as she has recently been in the hospital.  He stated he does not know either of those things because that is her private business.  CSW educated him that it is quite "normal" for married couples to know about each others' medical conditions.  He stated he does not know what he loves about her, does not even think he loves her anymore because this repeated loss of job and income has worn him down.  He feels he is dealing with it a lot better this time because of being in therapy, was high complimentary.  CSW reminded him he is the one doing the work.  We discussed his situation and various options for how to handle it, with CSW emphasizing that his wife cannot meet his needs if she does not know specifically what they are, and vice versa.  CSW suggested that they make a list of what they each need from the relationship and then discuss what can be done and what cannot be done.  CSW pointed out that no relationship can meet all needs; therefore, they should be prepared to figure out how to get some of their needs met elsewhere if need be, with friends or agencies/companies and such.  CSW gave a number of examples of how to approach this conversation with his wife using "I" statements.  He has been trying to use these more, but was able to identify that the phrase "You need to"  is used with his  wife frequently and is not helpful.  Suicidal/Homicidal: No  Therapist Response: Patient is making progress in using coping skills to successfully manage his depression and anxiety.  He has remained calm during the most recent crisis with his wife and is willing to take a step back and ensure that he approaches the problem with an open mind and good communication skills.  He states that the reason he is able to be so calm is what he has learned in therapy.  CSW provided empathy and positive regard in a demonstration of how he can approach his wife as well.  We  discussed his feelings for wife with CSW emphasizing that apathy is a greater indicator of relationship woes than anger.   Recommendations:  Return to therapy in 2 weeks, engage in self care behaviors, talk with wife about what he needs her to do moving forward for the relationship to be satisfactory to him  Plan: Return again in 2 weeks.  Diagnosis:  Encounter Diagnoses  Name Primary?   Major depressive disorder, recurrent episode, mild (HCC) Yes   GAD (generalized anxiety disorder)    Collaboration of Care: Psychiatrist AEB - psychiatrist and therapist both have access to notes in Epic  Patient/Guardian was advised Release of Information must be obtained prior to any record release in order to collaborate their care with an outside provider. Patient/Guardian was advised if they have not already done so to contact the registration department to sign all necessary forms in order for [place] to release information regarding their care.   Consent: Patient/Guardian gives verbal consent for treatment and assignment of benefits for services provided during this visit. Patient/Guardian expressed understanding and agreed to proceed.   Lynnell Chad, LCSW 04/25/2023

## 2023-04-26 ENCOUNTER — Other Ambulatory Visit (HOSPITAL_BASED_OUTPATIENT_CLINIC_OR_DEPARTMENT_OTHER): Payer: Self-pay

## 2023-05-08 ENCOUNTER — Other Ambulatory Visit (INDEPENDENT_AMBULATORY_CARE_PROVIDER_SITE_OTHER): Payer: BC Managed Care – PPO

## 2023-05-08 DIAGNOSIS — E559 Vitamin D deficiency, unspecified: Secondary | ICD-10-CM

## 2023-05-08 DIAGNOSIS — Z6841 Body Mass Index (BMI) 40.0 and over, adult: Secondary | ICD-10-CM | POA: Diagnosis not present

## 2023-05-08 DIAGNOSIS — R7303 Prediabetes: Secondary | ICD-10-CM

## 2023-05-08 DIAGNOSIS — E785 Hyperlipidemia, unspecified: Secondary | ICD-10-CM | POA: Diagnosis not present

## 2023-05-08 DIAGNOSIS — I10 Essential (primary) hypertension: Secondary | ICD-10-CM | POA: Diagnosis not present

## 2023-05-08 LAB — CBC WITH DIFFERENTIAL/PLATELET
Basophils Absolute: 0.1 10*3/uL (ref 0.0–0.1)
Basophils Relative: 1 % (ref 0.0–3.0)
Eosinophils Absolute: 0.1 10*3/uL (ref 0.0–0.7)
Eosinophils Relative: 2.3 % (ref 0.0–5.0)
HCT: 39 % (ref 39.0–52.0)
Hemoglobin: 13.3 g/dL (ref 13.0–17.0)
Lymphocytes Relative: 28.3 % (ref 12.0–46.0)
Lymphs Abs: 1.7 10*3/uL (ref 0.7–4.0)
MCHC: 34 g/dL (ref 30.0–36.0)
MCV: 82.9 fl (ref 78.0–100.0)
Monocytes Absolute: 0.4 10*3/uL (ref 0.1–1.0)
Monocytes Relative: 6.8 % (ref 3.0–12.0)
Neutro Abs: 3.7 10*3/uL (ref 1.4–7.7)
Neutrophils Relative %: 61.6 % (ref 43.0–77.0)
Platelets: 338 10*3/uL (ref 150.0–400.0)
RBC: 4.7 Mil/uL (ref 4.22–5.81)
RDW: 14.2 % (ref 11.5–15.5)
WBC: 6 10*3/uL (ref 4.0–10.5)

## 2023-05-08 LAB — COMPREHENSIVE METABOLIC PANEL
ALT: 17 U/L (ref 0–53)
AST: 16 U/L (ref 0–37)
Albumin: 4.3 g/dL (ref 3.5–5.2)
Alkaline Phosphatase: 56 U/L (ref 39–117)
BUN: 10 mg/dL (ref 6–23)
CO2: 27 mEq/L (ref 19–32)
Calcium: 9.4 mg/dL (ref 8.4–10.5)
Chloride: 103 mEq/L (ref 96–112)
Creatinine, Ser: 0.92 mg/dL (ref 0.40–1.50)
GFR: 104.75 mL/min (ref >60.00)
Glucose, Bld: 99 mg/dL (ref 70–99)
Potassium: 4 mEq/L (ref 3.5–5.1)
Sodium: 137 mEq/L (ref 135–145)
Total Bilirubin: 0.6 mg/dL (ref 0.2–1.2)
Total Protein: 7.6 g/dL (ref 6.0–8.3)

## 2023-05-08 LAB — LIPID PANEL
Cholesterol: 187 mg/dL (ref 0–200)
HDL: 33.8 mg/dL — ABNORMAL LOW (ref >39.00)
LDL Cholesterol: 117 mg/dL — ABNORMAL HIGH (ref 0–99)
NonHDL: 152.86
Total CHOL/HDL Ratio: 6
Triglycerides: 178 mg/dL — ABNORMAL HIGH (ref 0.0–149.0)
VLDL: 35.6 mg/dL (ref 0.0–40.0)

## 2023-05-08 LAB — HEMOGLOBIN A1C: Hgb A1c MFr Bld: 5.8 % (ref 4.6–6.5)

## 2023-05-08 LAB — VITAMIN B12: Vitamin B-12: 211 pg/mL (ref 211–911)

## 2023-05-08 LAB — VITAMIN D 25 HYDROXY (VIT D DEFICIENCY, FRACTURES): VITD: 14.45 ng/mL — ABNORMAL LOW (ref 30.00–100.00)

## 2023-05-08 LAB — TSH: TSH: 3.16 u[IU]/mL (ref 0.35–5.50)

## 2023-05-08 IMAGING — CT CT VENOGRAM HEAD
4 of 13 series · 16 of 47 positions shown · IV contrast (omnipaque)
Comparison: Same day CT head

CLINICAL DATA: Headache, sudden, severe; Dural venous sinus
thrombosis suspected Headache, chronic, new features or increased
frequency

EXAM:
CT ANGIOGRAPHY HEAD
CT VENOGRAM HEAD
TECHNIQUE: Multidetector CT imaging of the head was performed using the
standard protocol during bolus administration of intravenous
contrast. Multiplanar CT image reconstructions and MIPs were
obtained to evaluate the vascular anatomy.
Venographic phase images of the brain were obtained following the
administration of
intravenous contrast. Multiplanar reformats and maximum intensity
projections were generated.
CONTRAST:  80mL OMNIPAQUE IOHEXOL 350 MG/ML SOLN

[Series 5: cta head · axial · 0.49mm/px · z∈[+1675,+1709]mm · 2 of 102 slices shown]
[im 17/102  brain]
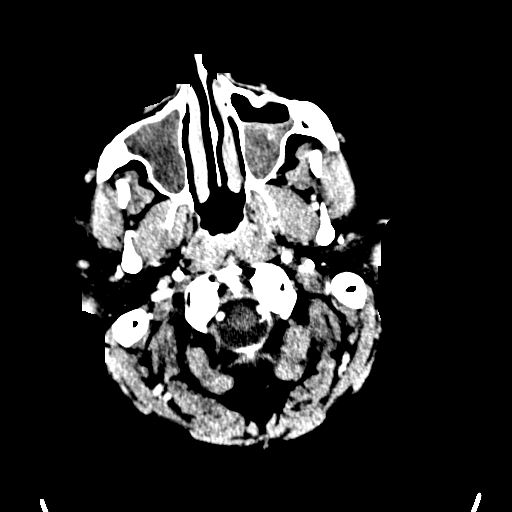
[im 34/102  brain]
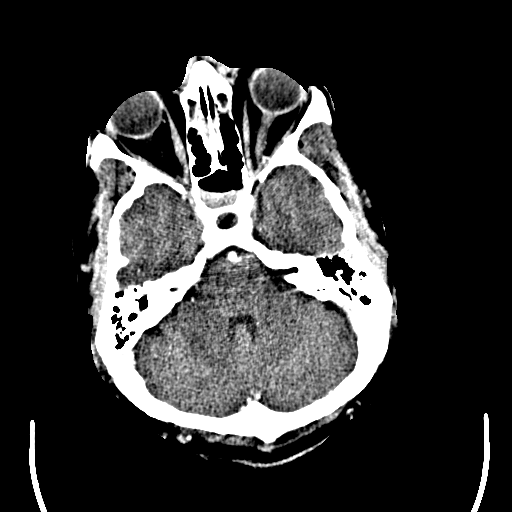

[Series 7: ax thin · axial · 0.40mm/px · z∈[+1646,+1803]mm · 10 of 194 slices shown]
[im 18/194  brain]
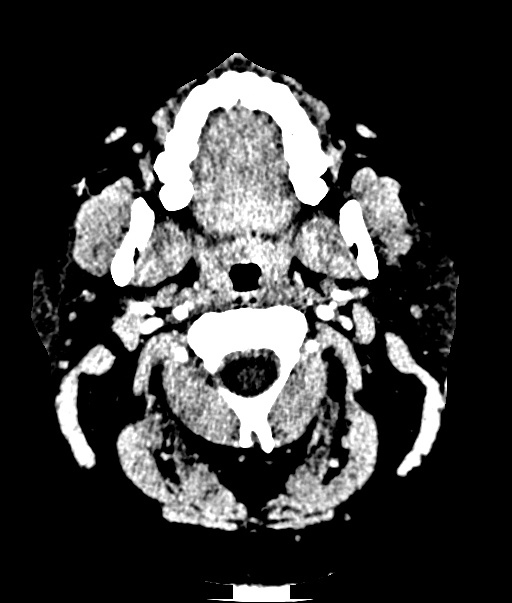
[im 36/194  bone]
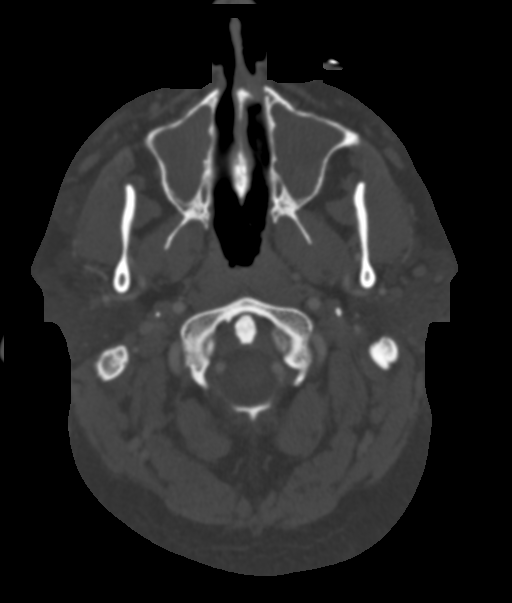
[im 53/194  brain]
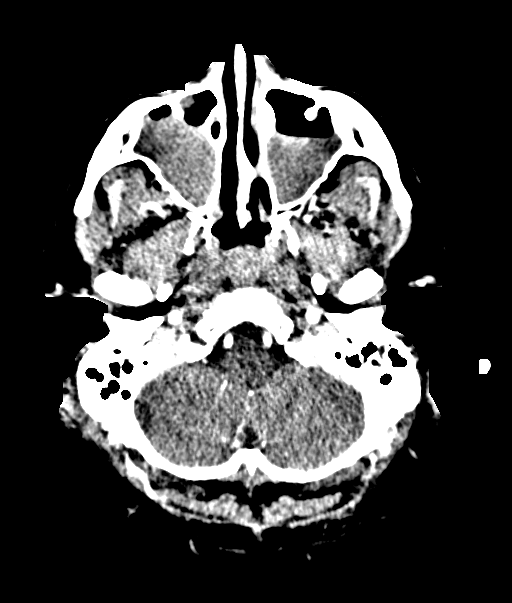
[im 71/194  bone]
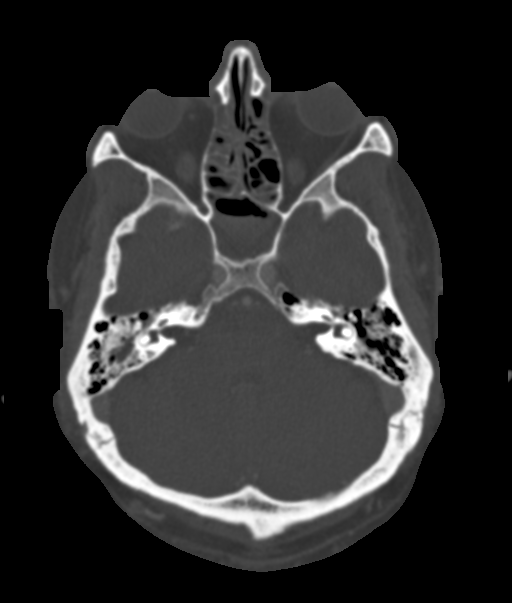
[im 88/194  brain]
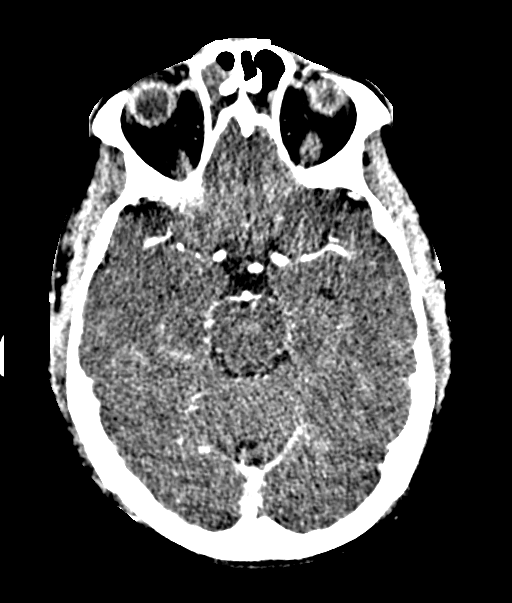
[im 106/194  bone]
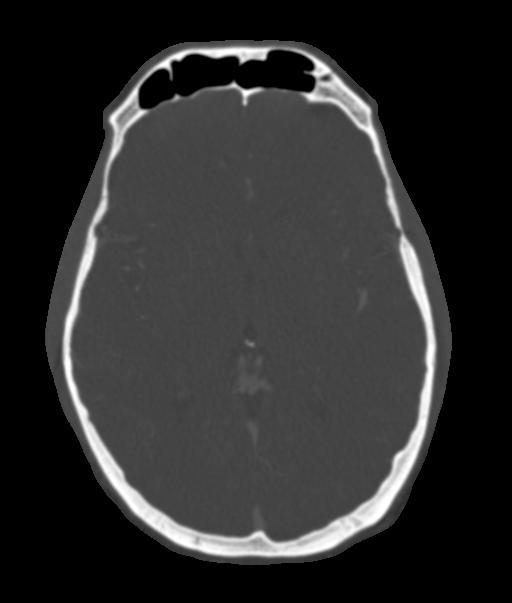
[im 123/194  brain]
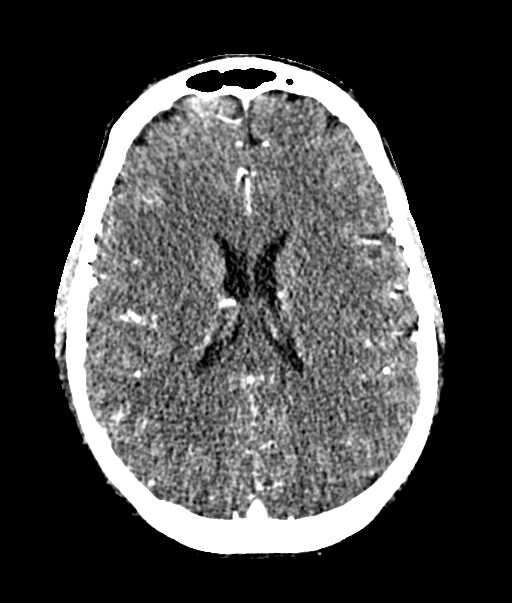
[im 141/194  bone]
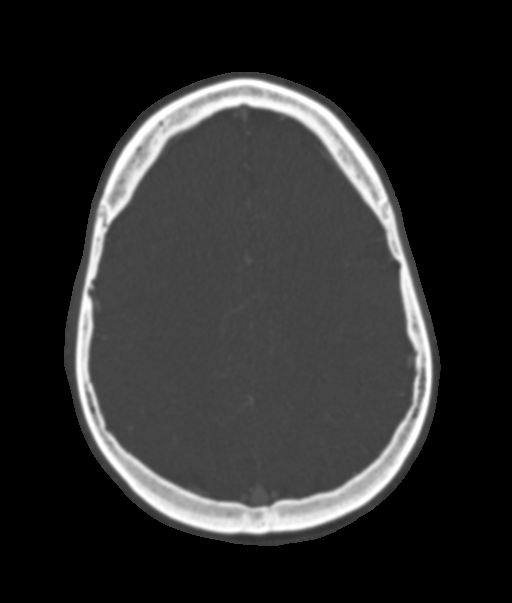
[im 158/194  brain]
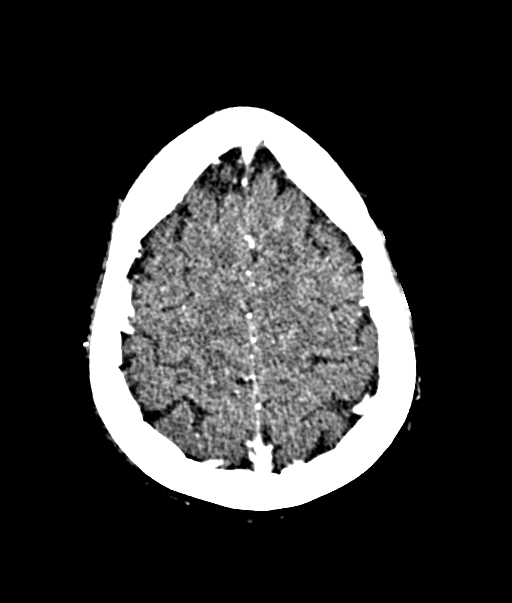
[im 176/194  bone]
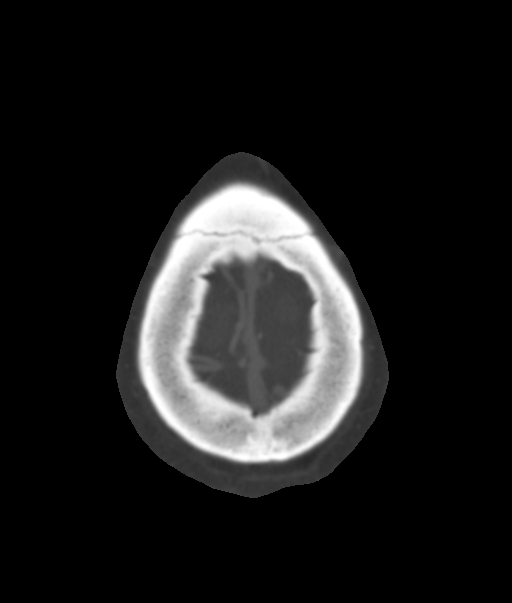

[Series 8: coronal thin · coronal · 0.40mm/px · 3 of 225 slices shown]
[im 57/225  brain]
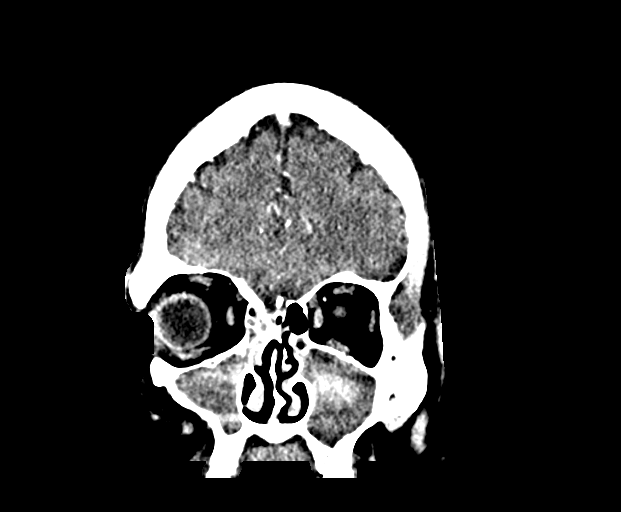
[im 113/225  brain]
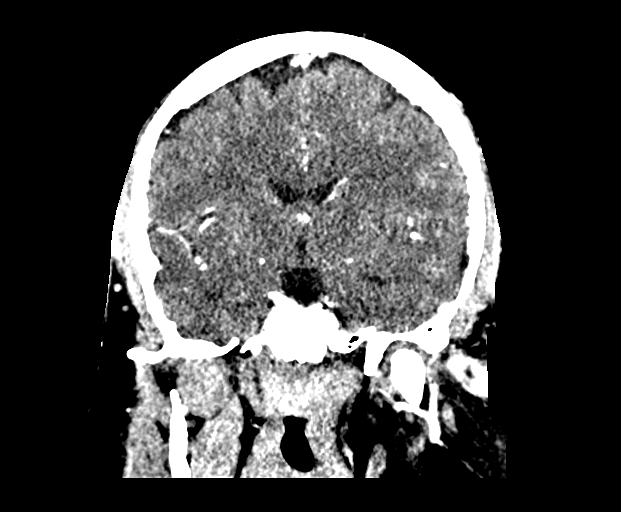
[im 169/225  brain]
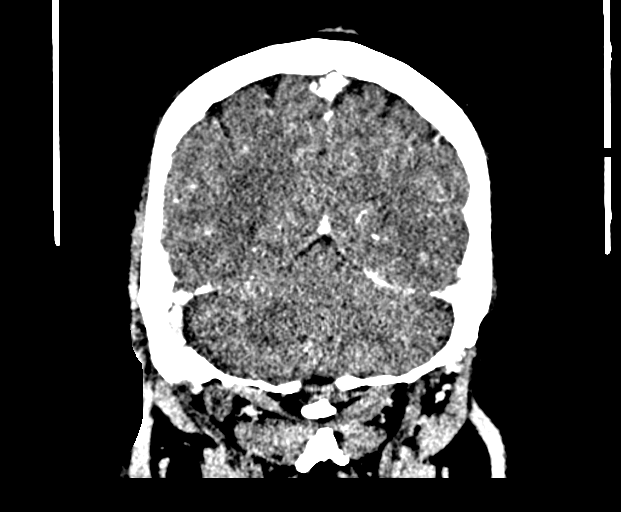

[Series 9: sagittal thin · sagittal · 0.41mm/px · 1 of 201 slices shown]
[im 101/201  brain]
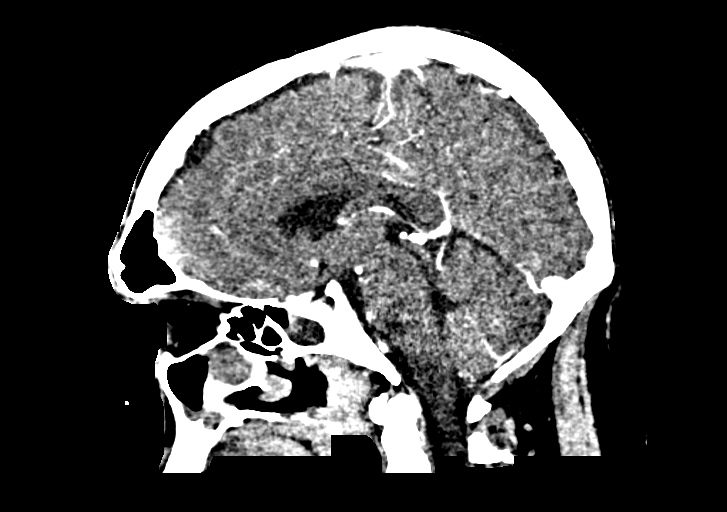

[16 of 47 positions shown; findings below may reference images not displayed]

FINDINGS: CTA HEAD

Anterior circulation: Bilateral intracranial ICAs, MCAs and ACAs are
patent without proximal hemodynamically significant stenosis. No
aneurysm identified.

Posterior circulation: Bilateral intradural vertebral arteries,
basilar artery, and posterior cerebral arteries are patent without
proximal hemodynamically significant stenosis. No aneurysm
identified.

Venous sinuses: See below.

Review of the MIP images confirms the above findings.

CT VENOGRAM

No evidence of dural venous sinus thrombosis. The superior sagittal
sinus transverse, sigmoid and straight sinuses appear patent. The
visible deep cerebral veins appear patent.
IMPRESSION: 1. No large vessel occlusion or proximal hemodynamically significant
arterial stenosis.
2. No evidence of dural venous sinus thrombosis.

## 2023-05-09 ENCOUNTER — Other Ambulatory Visit: Payer: Self-pay | Admitting: Family Medicine

## 2023-05-09 ENCOUNTER — Ambulatory Visit (HOSPITAL_COMMUNITY): Payer: BC Managed Care – PPO | Admitting: Clinical

## 2023-05-09 DIAGNOSIS — R7989 Other specified abnormal findings of blood chemistry: Secondary | ICD-10-CM

## 2023-05-09 MED ORDER — VITAMIN D (ERGOCALCIFEROL) 1.25 MG (50000 UNIT) PO CAPS
50000.0000 [IU] | ORAL_CAPSULE | ORAL | 1 refills | Status: DC
  Filled 2023-05-09: qty 12, 84d supply, fill #0

## 2023-05-10 ENCOUNTER — Other Ambulatory Visit (HOSPITAL_BASED_OUTPATIENT_CLINIC_OR_DEPARTMENT_OTHER): Payer: Self-pay

## 2023-05-13 ENCOUNTER — Encounter: Payer: BC Managed Care – PPO | Admitting: Family Medicine

## 2023-05-21 ENCOUNTER — Encounter (HOSPITAL_COMMUNITY): Payer: Self-pay | Admitting: Psychiatry

## 2023-05-21 ENCOUNTER — Telehealth (HOSPITAL_BASED_OUTPATIENT_CLINIC_OR_DEPARTMENT_OTHER): Payer: BC Managed Care – PPO | Admitting: Psychiatry

## 2023-05-21 ENCOUNTER — Other Ambulatory Visit (HOSPITAL_BASED_OUTPATIENT_CLINIC_OR_DEPARTMENT_OTHER): Payer: Self-pay

## 2023-05-21 DIAGNOSIS — F330 Major depressive disorder, recurrent, mild: Secondary | ICD-10-CM

## 2023-05-21 DIAGNOSIS — F411 Generalized anxiety disorder: Secondary | ICD-10-CM | POA: Diagnosis not present

## 2023-05-21 DIAGNOSIS — F5105 Insomnia due to other mental disorder: Secondary | ICD-10-CM

## 2023-05-21 MED ORDER — ESZOPICLONE 2 MG PO TABS
2.0000 mg | ORAL_TABLET | Freq: Every evening | ORAL | 1 refills | Status: DC | PRN
  Filled 2023-05-21 – 2023-07-21 (×2): qty 30, 30d supply, fill #0

## 2023-05-21 MED ORDER — BUPROPION HCL ER (XL) 150 MG PO TB24
150.0000 mg | ORAL_TABLET | ORAL | 2 refills | Status: DC
  Filled 2023-05-21 – 2023-07-21 (×2): qty 30, 30d supply, fill #0

## 2023-05-21 MED ORDER — DULOXETINE HCL 30 MG PO CPEP
90.0000 mg | ORAL_CAPSULE | Freq: Every day | ORAL | 2 refills | Status: DC
  Filled 2023-05-21 – 2023-07-21 (×2): qty 90, 30d supply, fill #0

## 2023-05-21 NOTE — Progress Notes (Signed)
BH MD/PA/NP OP Progress Note  05/21/2023 3:16 PM Cory Gomez  MRN:  [id number]  Visit Diagnosis:    ICD-10-CM   1. Major depressive disorder, recurrent episode, mild (HCC)  F33.0 DULoxetine (CYMBALTA) 30 MG capsule    buPROPion (WELLBUTRIN XL) 150 MG 24 hr tablet    2. GAD (generalized anxiety disorder)  F41.1 DULoxetine (CYMBALTA) 30 MG capsule    3. Insomnia due to mental disorder  F51.05 eszopiclone (LUNESTA) 2 MG TABS tablet      [name] is a 42 y.o. male with a history of MDD, GAD, ADHD, binge eating disorder, and Vit D deficiency who presented to [facility] for initial evaluation on 01/15/2023.  At initial evaluation patient reported neurovegetative symptoms of depression including low mood, anhedonia, amotivation, worthlessness, insomnia, increased appetite, and occasional passive SI.  He denied any intent or plan.  Patient was able to complete a safety plan and lists his mother as a good support.  Patient furthermore endorsed symptoms of anxiety including feeling nervous or on edge, being unable to stop or control his worrying, difficulty relaxing, increased irritability, restlessness, and fears that something awful might happen.  Psychosocially patient has increased stress from financial struggles, difficulty at work, and interpersonal struggles in his marriage.  Patient meets criteria for MDD and generalized anxiety disorder with further clarity needed to rule out ADHD.  He was referred for neuropsych testing.  Of note due to patient's sleep troubles and body habitus there is some concern for sleep apnea and he was referred to a sleep specialist.  Cory Gomez presents for follow-up evaluation. Today, 05/21/23, patient reports continued improvement in mood, anxiety, and insomnia symptoms.  His sleep is increased to 6 to 7 hours a night since starting on Lunesta.  He denies any adverse side effects from the  medication.  Patient does continue to endorse financial and interpersonal stressors however has been benefiting from therapy and developed a number of coping skills to help manage the related anxiety.  Will continue on his current regimen and follow-up in 2 months.  Plan: - Continue Cymbalta 90 mg QD - Continue Wellbutrin XL 150 mg QD - Continue Lunesta 2 mg QHS - Discontinue Ambien 10 mg QHS prn for insomnia, PDMP reviewed - Neuropsych referral - Sleep specialist appointment upcoming  - Continue therapy with Ambrose Mantle twice a month - Crisis resources reviewed - Follow up in 2 months    Chief Complaint:  Chief Complaint  Patient presents with   Follow-up   HPI: Cory Gomez presents reporting that he has been doing better over the past 3 months. He has been taking his medications regularly and finds them beneficial for managing anxiety/depressive symptoms.  He denies any adverse side effects from the medications.  Patient has also been taking Lunesta and reports 6 to 7 hours of sleep a night.  There is been some confusion with the patient's report and PDMP as he reports he has been taking the Lunesta most nights however PDMP shows last refill was in February.  Patient was at work so was unable to check his pill bottle for confirmation.  Patient has also been continuing therapy twice a month and has found to be particularly helpful in developing coping skills.  He still does have financial stressors along with some interpersonal issues with his wife that have increased his anxiety.  However with coping skills he is better able to work through them.  Patient has yet to hear back about neuropsych testing.  He was provided with contact information to follow-up on referral.  Past Psychiatric History: First episode of depression was in early elementary school after his parents divorced. He was in counseling and prescribed bupropion then. Later, as an adult, he was briefly on fluoxetine with no  clear benefit. While in school he was diagnosed with having ADHD inattentive type and was on Ritalin. He had seen Dr. Hinton Dyer from 2020-2022. He has no hx of inpatient psychiatric admissions, no hx of mania or overt psychosis. Denies any prior suicide attempts.   Has tried Adderall, Vyvanse, Ritalin, bupropion, Prozac, Zoloft, Cymbalta, trazodone, Zolpidem, topirimate, Lunesta   He does not abuse alcohol or street drugs.  Past Medical History:  Past Medical History:  Diagnosis Date   ADHD (attention deficit hyperactivity disorder)    Annual physical exam 09/09/2019   Anxiety and depression    COVID-19    12/28/19   Depression, recurrent (HCC) 09/09/2019   Elevated liver enzymes 06/11/2019   Excessive ear wax, left 09/09/2019   Headache 12/31/2021   Hypertriglyceridemia 07/17/2021   Insomnia    Insomnia    Inverted nipple 09/16/2019   Mastoiditis of both sides    Nystagmus 01/22/2022   Obesity (BMI 30-39.9)    Obesity (BMI 30-39.9)    Prediabetes    Suicidal ideation 07/14/2021   Vertigo     Past Surgical History:  Procedure Laterality Date   NO PAST SURGERIES      Family History:  Family History  Problem Relation Age of Onset   Diabetes Mother    Hypertension Mother    Hyperlipidemia Mother    Diabetes Mellitus II Mother    Anxiety disorder Mother    Depression Mother    Stroke Father    Alcoholism Father    Anxiety disorder Brother    Depression Brother    Hypertension Brother    Hyperlipidemia Brother    Diabetes type II Brother    ADD / ADHD Brother    Dementia Maternal Grandfather     Social History:  Social History   Socioeconomic History   Marital status: Married    Spouse name: Not on file   Number of children: Not on file   Years of education: Not on file   Highest education level: Not on file  Occupational History   Not on file  Tobacco Use   Smoking status: Never   Smokeless tobacco: Never  Vaping Use   Vaping Use: Never used   Substance and Sexual Activity   Alcohol use: Not Currently   Drug use: Not Currently   Sexual activity: Yes    Partners: Female  Other Topics Concern   Not on file  Social History Narrative   Married no kids wife is Soul Companion    -wife and mother [name] on [place]      Has siblings    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 12 capsule 1   acetaminophen (TYLENOL) 500 MG tablet Take by mouth.     amLODipine (NORVASC) 5 MG tablet      amphetamine-dextroamphetamine (ADDERALL XR) 30 MG 24 hr capsule Take by mouth.     buPROPion (WELLBUTRIN XL) 150 MG 24 hr tablet Take 1 tablet (150 mg total) by mouth every morning. 30 tablet 2   DULoxetine (CYMBALTA) 30 MG capsule Take 3 capsules (90 mg total) by mouth daily. 90 capsule 2   eszopiclone (LUNESTA) 2 MG TABS tablet Take 1 tablet (2 mg total) by mouth at bedtime as needed for sleep. Take immediately before bedtime 30 tablet 1   ketoconazole (NIZORAL) 2 % shampoo      Loratadine 5 MG TBDP Take 1 tablet (5 mg total) by mouth daily. 60 tablet 1   meclizine (ANTIVERT) 25 MG tablet Take by mouth.     No current facility-administered medications for this visit.     Psychiatric Specialty Exam: Review of Systems  There were no vitals taken for this visit.There is no height or weight on file to calculate BMI.  General Appearance: Well Groomed  Eye Contact:  Good  Speech:  Clear and Coherent and Normal Rate  Volume:  Normal  Mood:  Euthymic  Affect:   Congruent  Thought Process:  Coherent and Goal Directed  Orientation:  Full (Time, Place, and Person)  Thought Content: Logical   Suicidal Thoughts:  No  Homicidal Thoughts:  No  Memory:  NA  Judgement:  Fair  Insight:  Fair  Psychomotor Activity:  Normal  Concentration:  Concentration: Good  Recall:  Good  Fund of Knowledge: Fair  Language: Good  Akathisia:  NA    AIMS (if indicated): not done  Assets:  Passenger transport manager Vocational/Educational  ADL's:  Intact  Cognition: WNL  Sleep:  Good   Metabolic Disorder Labs: Lab Results  Component Value Date   HGBA1C 5.8 05/08/2023   No results found for: "PROLACTIN" Lab Results  Component Value Date   CHOL 187 05/08/2023   TRIG 178.0 (H) 05/08/2023   HDL 33.80 (L) 05/08/2023   CHOLHDL 6 05/08/2023   VLDL 35.6 05/08/2023   LDLCALC 117 (H) 05/08/2023   LDLCALC 102 (H) 06/11/2019   Lab Results  Component Value Date   TSH 3.16 05/08/2023   TSH 2.91 06/14/2021    Therapeutic Level Labs: No results found for: "LITHIUM" No results found for: "VALPROATE" No results found for: "CBMZ"   Screenings: GAD-7    Flowsheet Row Office Visit from 03/12/2023 in [facility] Visit from 01/15/2023 in [facility] Office Visit from 12/03/2022 in [facility] Visit from 01/11/2022 in [facility] [employer] Visit from 07/14/2021 in [facility]  Total GAD-7 Score 8 18 20 12 18       PHQ2-9    Flowsheet Row Office Visit from 03/12/2023 in [facility] [employer] Visit from 01/15/2023 in [facility] Office Visit from 12/03/2022 in [facility] [employer] Visit from 01/11/2022 in [facility] [employer] Visit from 07/14/2021 in [facility] [employer]  PHQ-2 Total Score 2 5 5 4 5   PHQ-9 Total Score 5 19 23 14 21       Flowsheet Row [facility] from 11/25/2022 in [facility] at [facility] Office Visit from 01/11/2022 in [facility] [place]  C-SSRS RISK CATEGORY No Risk No Risk No Risk       Collaboration of Care: Collaboration of Care: Medication Management AEB medication prescription and Referral or follow-up with counselor/therapist AEB chart review  Patient/Guardian was advised Release of Information must be obtained prior to any record release in order to collaborate their care with an outside provider. Patient/Guardian was advised if they have not already done so to contact the registration department to sign all necessary forms in order for [place] to release information regarding their care.   Consent: Patient/Guardian gives verbal consent for treatment and assignment of benefits for services provided during this visit. Patient/Guardian expressed understanding and agreed to proceed.    Stasia Cavalier, MD 05/21/2023, 3:16 PM   Virtual Visit via Video Note  I connected with Margaretha Seeds on 05/21/23 at  2:30 PM EDT by a video enabled telemedicine application and verified that I am speaking with the correct person using two identifiers.  Location: Patient: Home Provider: Home Office   I discussed the limitations of evaluation and management by telemedicine and the availability of in person appointments. The patient expressed understanding and agreed to proceed.   I discussed the assessment and treatment plan with the patient. The patient was provided an opportunity to ask questions and all were answered. The patient agreed with the plan and demonstrated an understanding of  the instructions.   The patient was advised to call back or seek an in-person evaluation if the symptoms worsen or if the condition fails to improve as anticipated.  I provided 15 minutes of non-face-to-face time during this encounter.   Stasia Cavalier, MD

## 2023-05-22 ENCOUNTER — Ambulatory Visit (HOSPITAL_COMMUNITY): Payer: BC Managed Care – PPO | Admitting: Clinical

## 2023-05-29 ENCOUNTER — Ambulatory Visit (INDEPENDENT_AMBULATORY_CARE_PROVIDER_SITE_OTHER): Payer: BC Managed Care – PPO | Admitting: Family Medicine

## 2023-05-29 VITALS — BP 138/78 | HR 101 | Ht 74.0 in | Wt 289.8 lb

## 2023-05-29 DIAGNOSIS — I10 Essential (primary) hypertension: Secondary | ICD-10-CM

## 2023-05-29 DIAGNOSIS — Z0000 Encounter for general adult medical examination without abnormal findings: Secondary | ICD-10-CM | POA: Diagnosis not present

## 2023-05-29 DIAGNOSIS — E559 Vitamin D deficiency, unspecified: Secondary | ICD-10-CM

## 2023-05-29 DIAGNOSIS — R7303 Prediabetes: Secondary | ICD-10-CM | POA: Diagnosis not present

## 2023-05-29 DIAGNOSIS — F39 Unspecified mood [affective] disorder: Secondary | ICD-10-CM

## 2023-05-29 NOTE — Patient Instructions (Addendum)
It was a pleasure meeting you today. Thank you for allowing me to take part in your health care.  Our goals for today as we discussed include:  Follow up in 1 year  If you have any questions or concerns, please do not hesitate to call the office at [phone].  I look forward to our next visit and until then take care and stay safe.  Regards,   [name], MD   [facility]

## 2023-05-29 NOTE — Progress Notes (Signed)
SUBJECTIVE:   Chief Complaint  Patient presents with   Annual Exam   Hypertension    Patient has hx of elevated bllod pressure and would like to have it evaluated   HPI Patient presents to clinic for annual physical  Hypertension Self discontinued amlodipine.  Denies any headaches, visual changes, chest pain, shortness of breath or lower extremity edema.  Does not check blood pressures at home.    Mood disorder/ADHD/sleep disorder Since establishing care with Dr. Mercy Riding at behavioral health restarting has been doing great.  Continues to compliant with Wellbutrin XL 150 mg daily 99 daily.  Sleep has improved since initiation of Lunesta. Denies SI/HI.  Used to have marital problems and has been trying to work on this with wife.  Hyperlipidemia Continues to portion control meals and trying to maintain healthy nutrition.  Continues to walk to remain active.  Reviewed recent labs.  Significant for elevated LDL and triglycerides, low vitamin D.  Has been taking vitamin D supplements as previously prescribed.  PERTINENT PMH / PSH: Mood disorder Hypertension Prediabetes ADD Hyperlipidemia Obesity class III   OBJECTIVE:  BP 138/78 (BP Location: Left Arm, Patient Position: Sitting, Cuff Size: Normal)   Pulse (!) 101   Ht 6\' 2"  (1.88 m)   Wt 289 lb 12.8 oz (131.5 kg)   SpO2 97%   BMI 37.21 kg/m    Physical Exam Vitals reviewed.  Constitutional:      General: He is not in acute distress.    Appearance: Normal appearance. He is obese. He is not ill-appearing, toxic-appearing or diaphoretic.  HENT:     Right Ear: Tympanic membrane, ear canal and external ear normal.     Left Ear: Tympanic membrane, ear canal and external ear normal.  Eyes:     General:        Right eye: No discharge.        Left eye: No discharge.  Neck:     Thyroid: No thyromegaly or thyroid tenderness.  Cardiovascular:     Rate and Rhythm: Normal rate and regular rhythm.     Heart sounds: Normal heart  sounds.  Pulmonary:     Effort: Pulmonary effort is normal.     Breath sounds: Normal breath sounds.  Abdominal:     General: Bowel sounds are normal.  Musculoskeletal:        General: Normal range of motion.     Cervical back: Normal range of motion.  Skin:    General: Skin is warm and dry.  Neurological:     Mental Status: He is alert and oriented to person, place, and time. Mental status is at baseline.  Psychiatric:        Mood and Affect: Mood normal.        Behavior: Behavior normal.        Thought Content: Thought content normal.        Judgment: Judgment normal.       05/29/2023   11:37 AM 03/12/2023   10:53 AM 01/15/2023    1:28 PM 12/03/2022    9:42 AM 01/11/2022    1:13 PM  Depression screen PHQ 2/9  Decreased Interest 1 1  2 2   Down, Depressed, Hopeless 1 1  3 2   PHQ - 2 Score 2 2  5 4   Altered sleeping 2 0  3 1  Tired, decreased energy 2 1  3 1   Change in appetite 0 1  2 2   Feeling bad or failure about yourself  1 0  3 2  Trouble concentrating 2 1  3 1   Moving slowly or fidgety/restless 1 0  2 2  Suicidal thoughts  0  2 1  PHQ-9 Score 10 5  23 14   Difficult doing work/chores Somewhat difficult Somewhat difficult  Extremely dIfficult Very difficult     Information is confidential and restricted. Go to Review Flowsheets to unlock data.       05/29/2023   11:38 AM 03/12/2023   10:53 AM 01/15/2023    1:30 PM 12/03/2022    9:43 AM  GAD 7 : Generalized Anxiety Score  Nervous, Anxious, on Edge 1 2  3   Control/stop worrying 1 1  3   Worry too much - different things 0 1  3  Trouble relaxing 1 0  3  Restless 1 0  2  Easily annoyed or irritable 2 2  3   Afraid - awful might happen 0 2  3  Total GAD 7 Score 6 8  20   Anxiety Difficulty Somewhat difficult Somewhat difficult  Extremely difficult     Information is confidential and restricted. Go to Review Flowsheets to unlock data.      ASSESSMENT/PLAN:  Annual physical exam Assessment & Plan: HCM Declined  hepatitis C screening HIV screening completed Tetanus up-to-date No indication for PSA screening Lipid screening completed Hypertension screening completed.   Depression/GAD screening completed.  Patient currently on treatment and followed by psychiatry.   Vitamin D deficiency Assessment & Plan: Vitamin D low Continue vitamin D 1.25 mg weekly    Prediabetes Assessment & Plan: Chronic. Recent A1c 5.8, slight improvement from previous Encourage healthy lifestyle and increased activity   Mood disorder [facility]) Assessment & Plan: Chronic.  PHQ-9 and GAD remain elevated.  Denies SI/HI Takes Wellbutrin XL 150 mg daily and duloxetine 90 mg daily Recommend continued CBT and follow up with psychiatry   Hypertension, unspecified type Assessment & Plan: Chronic.  BPs were noted to be elevated during hospitalization for acute sinusitis.  Since then he has not had any further episodes of elevated blood pressure.  Has self discontinued amlodipine and remains asymptomatic.  Continue to monitor blood pressure at home.  Goal less than 130/80 Limit sodium intake Increase activity and weight management to help lower BP Strict return precautions provided.    PDMP reviewed  Return in about 1 year (around 05/28/2024) for annual visit with fasting labs 1 week prior.  Annual physical in 2 to 3 months  [name], MD

## 2023-05-30 ENCOUNTER — Other Ambulatory Visit (HOSPITAL_BASED_OUTPATIENT_CLINIC_OR_DEPARTMENT_OTHER): Payer: Self-pay

## 2023-06-08 ENCOUNTER — Encounter: Payer: Self-pay | Admitting: Family Medicine

## 2023-06-08 DIAGNOSIS — F39 Unspecified mood [affective] disorder: Secondary | ICD-10-CM | POA: Insufficient documentation

## 2023-06-08 DIAGNOSIS — Z0000 Encounter for general adult medical examination without abnormal findings: Secondary | ICD-10-CM | POA: Insufficient documentation

## 2023-06-08 NOTE — Assessment & Plan Note (Signed)
Chronic.  PHQ-9 and GAD remain elevated.  Denies SI/HI Takes Wellbutrin XL 150 mg daily and duloxetine 90 mg daily Recommend continued CBT and follow up with psychiatry

## 2023-06-08 NOTE — Assessment & Plan Note (Signed)
Chronic. Recent A1c 5.8, slight improvement from previous Encourage healthy lifestyle and increased activity

## 2023-06-08 NOTE — Assessment & Plan Note (Signed)
Vitamin D low Continue vitamin D 1.25 mg weekly  

## 2023-06-08 NOTE — Assessment & Plan Note (Signed)
HCM Declined hepatitis C screening HIV screening completed Tetanus up-to-date No indication for PSA screening Lipid screening completed Hypertension screening completed.   Depression/GAD screening completed.  Patient currently on treatment and followed by psychiatry.

## 2023-06-08 NOTE — Assessment & Plan Note (Signed)
Chronic.  BPs were noted to be elevated during hospitalization for acute sinusitis.  Since then he has not had any further episodes of elevated blood pressure.  Has self discontinued amlodipine and remains asymptomatic.  Continue to monitor blood pressure at home.  Goal less than 130/80 Limit sodium intake Increase activity and weight management to help lower BP Strict return precautions provided.

## 2023-06-12 ENCOUNTER — Encounter (HOSPITAL_COMMUNITY): Payer: Self-pay | Admitting: Clinical

## 2023-06-12 ENCOUNTER — Ambulatory Visit (HOSPITAL_COMMUNITY): Payer: BC Managed Care – PPO | Admitting: Clinical

## 2023-06-12 DIAGNOSIS — F330 Major depressive disorder, recurrent, mild: Secondary | ICD-10-CM | POA: Diagnosis not present

## 2023-06-12 DIAGNOSIS — F411 Generalized anxiety disorder: Secondary | ICD-10-CM

## 2023-06-12 NOTE — Progress Notes (Signed)
THERAPIST PROGRESS NOTE  Session Time:  4:01-5:01pm  Session #5  Participation Level: Active  Behavioral Response: Casual Alert Negative, Anxious, and Irritable  Type of Therapy: Individual Therapy  Treatment Goals addressed:  LTG: Cory "[name]" will score less than 5 on the Generalized Anxiety Disorder 7 Scale (GAD-7)    STG: Cory "[name]" will reduce frequency of avoidant behaviors by 50% as evidenced by self-report in therapy sessions LTG: Reduce frequency, intensity, and duration of depression symptoms so that daily functioning is improved   STG: Reduce overall depression score to no more than 9 on the Patient Health Questionnaire (PHQ-9)   LTG: Cory "[name]" will demonstrate improvement in quality and duration of sleep as measured by self-report of more consistent hours slept   STG: Cory "[name]" will identify and try 4-5 non-medication sleep hygiene aids that they feel help them with sleep (earplugs, music, breathing, progressive relaxation, etc.)   ProgressTowards Goals: Progressing  Interventions: Solution Focused, Assertiveness Training, and Supportive  Summary: Cory Gomez is a 42 y.o. male who presents with MDD/severe, GAD, insomnia, and relationship problems with his partner.  He reported that his sleep is much improved and he is no longer having to take Lunesta every night, is generally taking it every other night.  He shared with frustration that his wife quit the job she had just gotten and took another new job.  This means she has not been making money and so he still has no car, is quite frustrated.  He stated multiple times during the appointment that he is "done."  When asked what that means to him, he indicated it means he is "done" with her behavior, but was unable to define what that means in terms of their relationship.  He did state "everybody" is telling him to divorce her.  He works hard not to let these outside influences determine his decisions, however, for which  he received positive strokes from CSW.    He feels he has tried very hard and has been very flexible and accepting, but she will not do the work that is necessary to get well.  He does compare her to himself, stating that he is on medicine and he is in therapy because it helps, but she went off her medication without seeing the doctor and she quit therapy as well.  Therefore he sees himself as improving as a result of his work and her not improving as a result of her refusal to do the work.  CSW guided patient through a discussion about enabling.  Before he left, therapist provided him with a handout about detachment that came from [facility] and has the [employer] information on it, talked to him about possibly going to an [employer] when his transportation gets sorted out.  CSW encouraged patient to practice self-compassion.  He has been practicing self-care as recommended earlier in therapeutic relationship by asking wife to give him an hour when he gets home to decompress.   Thus it is highly hoped that his self-compassion will be productive as well.  We talked about ways in which he might encourage his wife to keep up her end of their agreements particularly about cleanliness in the home.  He stated they did a deep clean on their bedroom 2 weeks ago and now her side is really messy again.  CSW educated him that this may be the outer limit of her ability to keep things neat and tidy, and that maybe this  deep cleaning needs to be done every 2 months.  This would be both to keep him happier in a neater, cleaner environment but also to teach her about cleaning skills as well as helping others possibly in the future with the skills.    Suicidal/Homicidal: No  Therapist Response: Patient is progressing AEB engaging in scheduled therapy session.  He presented oriented x5 and stated he was feeling "bad about things going on at home but improved with sleep."  CSW evaluated patient's medication compliance and  self-care since last session.  CSW reviewed the last session with patient who reported that his wife has quit yet another job and that he does not have a job.  Patient stated he has come to the conclusion he can only do so much and he needs to let other things fall on his wife.  CSW pressed him to identify what this means in actual practice, for instance, will he allow the electricity to be turned off if she is the one who is supposed to pay the light bill and she cannot.    He had no answers at this time but will think about what this does mean for him.  He did report that he has "been at the bottom" and that he does not want to live like that again.  CSW encouraged patient to schedule more therapy sessions for the future, as he does not have any more therapy appointments currently scheduled.  Throughout the session, CSW gave patient the opportunity to explore thoughts and feelings associated with current life situations and past/present external stressors.   CSW encouraged patient's expression of feelings and validated patient's thoughts using empathy, active listening, open body language, and unconditional positive regard.      Recommendations:  Return to therapy ASAP by scheduling more appointments, engage in self care behaviors, talk with wife about what he needs her to do moving forward for the relationship to be satisfactory to him  Plan: Return again at next available appointment   Diagnosis:  Encounter Diagnoses  Name Primary?   Major depressive disorder, recurrent episode, mild (HCC) Yes   GAD (generalized anxiety disorder)     Collaboration of Care: Psychiatrist AEB - psychiatrist and therapist both have access to notes in Epic  Patient/Guardian was advised Release of Information must be obtained prior to any record release in order to collaborate their care with an outside provider. Patient/Guardian was advised if they have not already done so to contact the registration department to  sign all necessary forms in order for [place] to release information regarding their care.   Consent: Patient/Guardian gives verbal consent for treatment and assignment of benefits for services provided during this visit. Patient/Guardian expressed understanding and agreed to proceed.   Lynnell Chad, LCSW 6/19/2024Patient ID: [name], male   DOB: 05-May-1981, 42 y.o.   MRN: [id number]

## 2023-07-06 ENCOUNTER — Other Ambulatory Visit: Payer: Self-pay

## 2023-07-06 ENCOUNTER — Emergency Department (HOSPITAL_BASED_OUTPATIENT_CLINIC_OR_DEPARTMENT_OTHER): Payer: BC Managed Care – PPO

## 2023-07-06 ENCOUNTER — Encounter (HOSPITAL_BASED_OUTPATIENT_CLINIC_OR_DEPARTMENT_OTHER): Payer: Self-pay

## 2023-07-06 ENCOUNTER — Inpatient Hospital Stay (HOSPITAL_BASED_OUTPATIENT_CLINIC_OR_DEPARTMENT_OTHER)
Admission: EM | Admit: 2023-07-06 | Discharge: 2023-07-14 | DRG: 391 | Disposition: A | Discharge status: Home / Self Care | Payer: BC Managed Care – PPO | Attending: Family Medicine | Admitting: Family Medicine

## 2023-07-06 DIAGNOSIS — Z818 Family history of other mental and behavioral disorders: Secondary | ICD-10-CM | POA: Diagnosis not present

## 2023-07-06 DIAGNOSIS — Z6837 Body mass index (BMI) 37.0-37.9, adult: Secondary | ICD-10-CM

## 2023-07-06 DIAGNOSIS — Z833 Family history of diabetes mellitus: Secondary | ICD-10-CM | POA: Diagnosis not present

## 2023-07-06 DIAGNOSIS — F32A Depression, unspecified: Secondary | ICD-10-CM | POA: Diagnosis not present

## 2023-07-06 DIAGNOSIS — F909 Attention-deficit hyperactivity disorder, unspecified type: Secondary | ICD-10-CM | POA: Diagnosis present

## 2023-07-06 DIAGNOSIS — Z823 Family history of stroke: Secondary | ICD-10-CM

## 2023-07-06 DIAGNOSIS — F411 Generalized anxiety disorder: Secondary | ICD-10-CM | POA: Diagnosis not present

## 2023-07-06 DIAGNOSIS — K651 Peritoneal abscess: Secondary | ICD-10-CM | POA: Diagnosis present

## 2023-07-06 DIAGNOSIS — K631 Perforation of intestine (nontraumatic): Secondary | ICD-10-CM | POA: Diagnosis present

## 2023-07-06 DIAGNOSIS — Z811 Family history of alcohol abuse and dependence: Secondary | ICD-10-CM | POA: Diagnosis not present

## 2023-07-06 DIAGNOSIS — R7303 Prediabetes: Secondary | ICD-10-CM | POA: Diagnosis present

## 2023-07-06 DIAGNOSIS — Z8249 Family history of ischemic heart disease and other diseases of the circulatory system: Secondary | ICD-10-CM

## 2023-07-06 DIAGNOSIS — K630 Abscess of intestine: Secondary | ICD-10-CM | POA: Insufficient documentation

## 2023-07-06 DIAGNOSIS — Z83438 Family history of other disorder of lipoprotein metabolism and other lipidemia: Secondary | ICD-10-CM | POA: Diagnosis not present

## 2023-07-06 DIAGNOSIS — E66812 Obesity, class 2: Secondary | ICD-10-CM | POA: Insufficient documentation

## 2023-07-06 DIAGNOSIS — K5720 Diverticulitis of large intestine with perforation and abscess without bleeding: Principal | ICD-10-CM | POA: Diagnosis present

## 2023-07-06 DIAGNOSIS — Z79899 Other long term (current) drug therapy: Secondary | ICD-10-CM

## 2023-07-06 DIAGNOSIS — K5732 Diverticulitis of large intestine without perforation or abscess without bleeding: Secondary | ICD-10-CM | POA: Diagnosis not present

## 2023-07-06 DIAGNOSIS — Z1152 Encounter for screening for COVID-19: Secondary | ICD-10-CM

## 2023-07-06 DIAGNOSIS — F988 Other specified behavioral and emotional disorders with onset usually occurring in childhood and adolescence: Secondary | ICD-10-CM | POA: Diagnosis present

## 2023-07-06 DIAGNOSIS — R42 Dizziness and giddiness: Secondary | ICD-10-CM | POA: Diagnosis present

## 2023-07-06 DIAGNOSIS — K567 Ileus, unspecified: Secondary | ICD-10-CM | POA: Diagnosis not present

## 2023-07-06 DIAGNOSIS — F419 Anxiety disorder, unspecified: Secondary | ICD-10-CM | POA: Diagnosis not present

## 2023-07-06 DIAGNOSIS — R1084 Generalized abdominal pain: Secondary | ICD-10-CM | POA: Diagnosis not present

## 2023-07-06 DIAGNOSIS — R1030 Lower abdominal pain, unspecified: Secondary | ICD-10-CM | POA: Diagnosis not present

## 2023-07-06 DIAGNOSIS — I10 Essential (primary) hypertension: Secondary | ICD-10-CM | POA: Diagnosis not present

## 2023-07-06 DIAGNOSIS — E781 Pure hyperglyceridemia: Secondary | ICD-10-CM | POA: Diagnosis not present

## 2023-07-06 DIAGNOSIS — E669 Obesity, unspecified: Secondary | ICD-10-CM | POA: Insufficient documentation

## 2023-07-06 DIAGNOSIS — R188 Other ascites: Secondary | ICD-10-CM | POA: Diagnosis not present

## 2023-07-06 HISTORY — DX: Perforation of intestine (nontraumatic): K63.1

## 2023-07-06 HISTORY — DX: Calculus of kidney: N20.0

## 2023-07-06 LAB — CBC
HCT: 31.2 % — ABNORMAL LOW (ref 39.0–52.0)
HCT: 31.2 % — ABNORMAL LOW (ref 39.0–52.0)
Hemoglobin: 10.2 g/dL — ABNORMAL LOW (ref 13.0–17.0)
Hemoglobin: 9.8 g/dL — ABNORMAL LOW (ref 13.0–17.0)
MCH: 26.2 pg (ref 26.0–34.0)
MCH: 26.3 pg (ref 26.0–34.0)
MCHC: 32.7 g/dL (ref 30.0–36.0)
MCHC: 31.4 g/dL (ref 30.0–36.0)
MCV: 80.2 fL (ref 80.0–100.0)
MCV: 83.9 fL (ref 80.0–100.0)
Platelets: 297 10*3/uL (ref 150–400)
Platelets: 297 10*3/uL (ref 150–400)
RBC: 3.89 MIL/uL — ABNORMAL LOW (ref 4.22–5.81)
RBC: 3.72 MIL/uL — ABNORMAL LOW (ref 4.22–5.81)
RDW: 13.2 % (ref 11.5–15.5)
RDW: 13.2 % (ref 11.5–15.5)
WBC: 8.1 10*3/uL (ref 4.0–10.5)
WBC: 8.8 10*3/uL (ref 4.0–10.5)
nRBC: 0 % (ref 0.0–0.2)
nRBC: 0 % (ref 0.0–0.2)

## 2023-07-06 LAB — COMPREHENSIVE METABOLIC PANEL
ALT: 23 U/L (ref 0–44)
AST: 18 U/L (ref 15–41)
Albumin: 3.2 g/dL — ABNORMAL LOW (ref 3.5–5.0)
Alkaline Phosphatase: 52 U/L (ref 38–126)
Anion gap: 7 (ref 5–15)
BUN: 15 mg/dL (ref 6–20)
CO2: 24 mmol/L (ref 22–32)
Calcium: 8.5 mg/dL — ABNORMAL LOW (ref 8.9–10.3)
Chloride: 104 mmol/L (ref 98–111)
Creatinine, Ser: 0.78 mg/dL (ref 0.61–1.24)
GFR, Estimated: >60 mL/min (ref >60)
Glucose, Bld: 106 mg/dL — ABNORMAL HIGH (ref 70–99)
Potassium: 3.5 mmol/L (ref 3.5–5.1)
Sodium: 135 mmol/L (ref 135–145)
Total Bilirubin: 0.4 mg/dL (ref 0.3–1.2)
Total Protein: 7 g/dL (ref 6.5–8.1)

## 2023-07-06 LAB — URINALYSIS, ROUTINE W REFLEX MICROSCOPIC
Bilirubin Urine: NEGATIVE
Glucose, UA: NEGATIVE mg/dL
Hgb urine dipstick: NEGATIVE
Ketones, ur: NEGATIVE mg/dL
Leukocytes,Ua: NEGATIVE
Nitrite: NEGATIVE
Protein, ur: NEGATIVE mg/dL
Specific Gravity, Urine: >=1.03 (ref 1.005–1.030)
pH: 6 (ref 5.0–8.0)

## 2023-07-06 LAB — CREATININE, SERUM
Creatinine, Ser: 0.88 mg/dL (ref 0.61–1.24)
GFR, Estimated: >60 mL/min (ref >60)

## 2023-07-06 LAB — SARS CORONAVIRUS 2 BY RT PCR: SARS Coronavirus 2 by RT PCR: NEGATIVE

## 2023-07-06 LAB — LIPASE, BLOOD: Lipase: 30 U/L (ref 11–51)

## 2023-07-06 MED ORDER — DULOXETINE HCL 60 MG PO CPEP
90.0000 mg | ORAL_CAPSULE | Freq: Every day | ORAL | Status: DC
  Administered 2023-07-07 – 2023-07-14 (×8): 90 mg via ORAL
  Filled 2023-07-06 (×8): qty 1

## 2023-07-06 MED ORDER — ENOXAPARIN SODIUM 60 MG/0.6ML IJ SOSY
60.0000 mg | PREFILLED_SYRINGE | Freq: Every day | INTRAMUSCULAR | Status: DC
  Administered 2023-07-06 – 2023-07-13 (×8): 60 mg via SUBCUTANEOUS
  Filled 2023-07-06 (×8): qty 0.6

## 2023-07-06 MED ORDER — ZOLPIDEM TARTRATE 5 MG PO TABS
5.0000 mg | ORAL_TABLET | Freq: Every day | ORAL | Status: DC
  Administered 2023-07-07 – 2023-07-13 (×8): 5 mg via ORAL
  Filled 2023-07-06 (×8): qty 1

## 2023-07-06 MED ORDER — BUPROPION HCL ER (XL) 150 MG PO TB24
150.0000 mg | ORAL_TABLET | Freq: Every day | ORAL | Status: DC
  Administered 2023-07-07 – 2023-07-14 (×8): 150 mg via ORAL
  Filled 2023-07-06 (×8): qty 1

## 2023-07-06 MED ORDER — MORPHINE SULFATE (PF) 2 MG/ML IV SOLN
2.0000 mg | INTRAVENOUS | Status: DC | PRN
  Administered 2023-07-06: 2 mg via INTRAVENOUS
  Filled 2023-07-06: qty 1

## 2023-07-06 MED ORDER — POTASSIUM CHLORIDE IN NACL 20-0.9 MEQ/L-% IV SOLN
INTRAVENOUS | Status: DC
  Filled 2023-07-06 (×11): qty 1000

## 2023-07-06 MED ORDER — ACETAMINOPHEN 325 MG PO TABS
650.0000 mg | ORAL_TABLET | Freq: Four times a day (QID) | ORAL | Status: DC | PRN
  Administered 2023-07-07: 650 mg via ORAL
  Filled 2023-07-06: qty 2

## 2023-07-06 MED ORDER — SENNOSIDES-DOCUSATE SODIUM 8.6-50 MG PO TABS: 1.0000 | ORAL_TABLET | Freq: Every evening | ORAL | Status: DC | PRN

## 2023-07-06 MED ORDER — ACETAMINOPHEN 650 MG RE SUPP: 650.0000 mg | Freq: Four times a day (QID) | RECTAL | Status: DC | PRN

## 2023-07-06 MED ORDER — LACTATED RINGERS IV BOLUS
1000.0000 mL | Freq: Once | INTRAVENOUS | Status: AC
  Administered 2023-07-06: 1000 mL via INTRAVENOUS

## 2023-07-06 MED ORDER — ONDANSETRON HCL 4 MG/2ML IJ SOLN
4.0000 mg | Freq: Four times a day (QID) | INTRAMUSCULAR | Status: DC | PRN
  Administered 2023-07-07: 4 mg via INTRAVENOUS
  Filled 2023-07-06: qty 2

## 2023-07-06 MED ORDER — HYDROMORPHONE HCL 1 MG/ML IJ SOLN
1.0000 mg | INTRAMUSCULAR | Status: DC | PRN
  Administered 2023-07-07 (×2): 1 mg via INTRAVENOUS
  Filled 2023-07-06 (×2): qty 1

## 2023-07-06 MED ORDER — HYDROMORPHONE HCL 1 MG/ML IJ SOLN
1.0000 mg | Freq: Once | INTRAMUSCULAR | Status: AC
  Administered 2023-07-06: 1 mg via INTRAVENOUS
  Filled 2023-07-06: qty 1

## 2023-07-06 MED ORDER — IOHEXOL 300 MG/ML  SOLN
100.0000 mL | Freq: Once | INTRAMUSCULAR | Status: AC | PRN
  Administered 2023-07-06: 100 mL via INTRAVENOUS

## 2023-07-06 MED ORDER — PIPERACILLIN-TAZOBACTAM 3.375 G IVPB 30 MIN
3.3750 g | Freq: Once | INTRAVENOUS | Status: AC
  Administered 2023-07-06: 3.375 g via INTRAVENOUS
  Filled 2023-07-06: qty 50

## 2023-07-06 MED ORDER — PIPERACILLIN-TAZOBACTAM 3.375 G IVPB
3.3750 g | Freq: Three times a day (TID) | INTRAVENOUS | Status: DC
  Administered 2023-07-06 – 2023-07-14 (×23): 3.375 g via INTRAVENOUS
  Filled 2023-07-06 (×24): qty 50

## 2023-07-06 MED ORDER — ONDANSETRON HCL 4 MG/2ML IJ SOLN
4.0000 mg | Freq: Once | INTRAMUSCULAR | Status: AC
  Administered 2023-07-06: 4 mg via INTRAVENOUS
  Filled 2023-07-06: qty 2

## 2023-07-06 MED ORDER — MECLIZINE HCL 25 MG PO TABS: 25.0000 mg | ORAL_TABLET | Freq: Three times a day (TID) | ORAL | Status: DC | PRN

## 2023-07-06 NOTE — ED Notes (Signed)
Witnessed for rectal temp.

## 2023-07-06 NOTE — Progress Notes (Signed)
ED Pharmacy Antibiotic Sign Off An antibiotic consult was received from an ED provider for Zosyn (piperacillin-tazobactam) per pharmacy dosing for intra-abdominal infection. A chart review was completed to assess appropriateness.   The following one time order(s) were placed:  Zosyn 3.375g IV x1 over 30 minutes  Further antibiotic and/or antibiotic pharmacy consults should be ordered by the admitting provider if indicated.   Thank you for allowing pharmacy to be a part of this patient's care.   Wilburn Cornelia, PharmD, BCPS Clinical Pharmacist 07/06/2023 3:23 PM   Please refer to [facility]ION for pharmacy phone number

## 2023-07-06 NOTE — ED Triage Notes (Signed)
Patient having lower ABD Pain for 5 days. He has cough and congestions but that has resolved. He was also having V/D but that resolved on Thursday.

## 2023-07-06 NOTE — Consult Note (Signed)
Reason for Consult: Diverticulitis with abscess Referring Physician: Blake Divine MD  Cory Gomez is an 42 y.o. male.  HPI: Pleasant 42 year old male with a 5-day history of lower abdominal pain.  Started on Sunday.  The pain is persisted throughout the week.  Had some nausea and vomiting.  He was seen today at [facility] where CT scan showed acute diverticulitis with a 3 cm abscess.  He is hemodynamically stable.  His pain is well-controlled.  He was transferred to [facility] for definitive treatment.  Currently, he is comfortable.  He still has lower abdominal pain which she rates as a 10 out of 10 but he appears quite comfortable in bed.  Location is suprapubic.  Past Medical History:  Diagnosis Date   ADHD (attention deficit hyperactivity disorder)    Annual physical exam 09/09/2019   Anxiety and depression    COVID-19    12/28/19   Depression, recurrent (HCC) 09/09/2019   Elevated liver enzymes 06/11/2019   Excessive ear wax, left 09/09/2019   Headache 12/31/2021   Hypertriglyceridemia 07/17/2021   Insomnia    Insomnia    Inverted nipple 09/16/2019   Kidney stone    Mastoiditis of both sides    Nystagmus 01/22/2022   Obesity (BMI 30-39.9)    Obesity (BMI 30-39.9)    Prediabetes    Suicidal ideation 07/14/2021   Vertigo     Past Surgical History:  Procedure Laterality Date   NO PAST SURGERIES      Family History  Problem Relation Age of Onset   Diabetes Mother    Hypertension Mother    Hyperlipidemia Mother    Diabetes Mellitus II Mother    Anxiety disorder Mother    Depression Mother    Stroke Father    Alcoholism Father    Anxiety disorder Brother    Depression Brother    Hypertension Brother    Hyperlipidemia Brother    Diabetes type II Brother    ADD / ADHD Brother    Dementia Maternal Grandfather     Social History:  reports that he has never smoked. He has never used smokeless tobacco. He reports that he does not currently use alcohol. He  reports that he does not currently use drugs.  Allergies: No Known Allergies  Medications: I have reviewed the patient's current medications.  Results for orders placed or performed during the hospital encounter of 07/06/23 (from the past 48 hour(s))  Urinalysis, Routine w reflex microscopic -Urine, Clean Catch     Status: None   Collection Time: 07/06/23 11:04 AM  Result Value Ref Range   Color, Urine YELLOW YELLOW   APPearance CLEAR CLEAR   Specific Gravity, Urine >=1.030 1.005 - 1.030   pH 6.0 5.0 - 8.0   Glucose, UA NEGATIVE NEGATIVE mg/dL   Hgb urine dipstick NEGATIVE NEGATIVE   Bilirubin Urine NEGATIVE NEGATIVE   Ketones, ur NEGATIVE NEGATIVE mg/dL   Protein, ur NEGATIVE NEGATIVE mg/dL   Nitrite NEGATIVE NEGATIVE   Leukocytes,Ua NEGATIVE NEGATIVE    Comment: Microscopic not done on urines with negative protein, blood, leukocytes, nitrite, or glucose < 500 mg/dL. Performed at [facility], [address] [place], [place] [address]   Lipase, blood     Status: None   Collection Time: 07/06/23 12:36 PM  Result Value Ref Range   Lipase 30 11 - 51 U/L    Comment: Performed at [facility], [address] [place], [place] [address]  Comprehensive metabolic panel     Status: Abnormal   Collection Time: 07/06/23 12:36 PM  Result Value Ref Range   Sodium 135 135 - 145 mmol/L   Potassium 3.5 3.5 - 5.1 mmol/L   Chloride 104 98 - 111 mmol/L   CO2 24 22 - 32 mmol/L   Glucose, Bld 106 (H) 70 - 99 mg/dL    Comment: Glucose reference range applies only to samples taken after fasting for at least 8 hours.   BUN 15 6 - 20 mg/dL   Creatinine, Ser 4.09 0.61 - 1.24 mg/dL   Calcium 8.5 (L) 8.9 - 10.3 mg/dL   Total Protein 7.0 6.5 - 8.1 g/dL   Albumin 3.2 (L) 3.5 - 5.0 g/dL   AST 18 15 - 41 U/L   ALT 23 0 - 44 U/L   Alkaline Phosphatase 52 38 - 126 U/L   Total Bilirubin 0.4 0.3 - 1.2 mg/dL   GFR, Estimated >81 >19 mL/min    Comment: (NOTE) Calculated using the  CKD-EPI Creatinine Equation (2021)    Anion gap 7 5 - 15    Comment: Performed at [facility], [address] [place], [place] [address]  CBC     Status: Abnormal   Collection Time: 07/06/23 12:36 PM  Result Value Ref Range   WBC 8.1 4.0 - 10.5 K/uL   RBC 3.89 (L) 4.22 - 5.81 MIL/uL   Hemoglobin 10.2 (L) 13.0 - 17.0 g/dL   HCT 95.6 (L) 21.3 - 08.6 %   MCV 80.2 80.0 - 100.0 fL   MCH 26.2 26.0 - 34.0 pg   MCHC 32.7 30.0 - 36.0 g/dL   RDW 57.8 46.9 - 62.9 %   Platelets 297 150 - 400 K/uL   nRBC 0.0 0.0 - 0.2 %    Comment: Performed at [facility], [address] [place], [place] [address]  SARS Coronavirus 2 by RT PCR (hospital order, performed in [facility] hospital lab) *cepheid single result test* Anterior Nasal Swab     Status: None   Collection Time: 07/06/23 12:36 PM   Specimen: Anterior Nasal Swab  Result Value Ref Range   SARS Coronavirus 2 by RT PCR NEGATIVE NEGATIVE    Comment: (NOTE) SARS-CoV-2 target nucleic acids are NOT DETECTED.  The SARS-CoV-2 RNA is generally detectable in upper and lower respiratory specimens during the acute phase of infection. The lowest concentration of SARS-CoV-2 viral copies this assay can detect is 250 copies / mL. A negative result does not preclude SARS-CoV-2 infection and should not be used as the sole basis for treatment or other patient management decisions.  A negative result may occur with improper specimen collection / handling, submission of specimen other than nasopharyngeal swab, presence of viral mutation(s) within the areas targeted by this assay, and inadequate number of viral copies (<250 copies / mL). A negative result must be combined with clinical observations, patient history, and epidemiological information.  Fact Sheet for Patients:   RoadLapTop.co.za  Fact Sheet for Healthcare Providers: http://kim-miller.com/  This test is not yet approved or   cleared by the [place] FDA and has been authorized for detection and/or diagnosis of SARS-CoV-2 by FDA under an Emergency Use Authorization (EUA).  This EUA will remain in effect (meaning this test can be used) for the duration of the COVID-19 declaration under Section 564(b)(1) of the Act, 21 U.S.C. section 360bbb-3(b)(1), unless the authorization is terminated or revoked sooner.  Performed at [facility], [address] [place], [place] [address]     CT ABDOMEN PELVIS W CONTRAST  Result Date: 07/06/2023 CLINICAL DATA:  Acute lower abdominal pain. EXAM: CT ABDOMEN AND PELVIS WITH CONTRAST TECHNIQUE: Multidetector CT imaging of the abdomen and pelvis was performed using the standard protocol following bolus administration of intravenous contrast. RADIATION DOSE REDUCTION: This exam was performed according to the departmental dose-optimization program which includes automated exposure control, adjustment of the mA and/or kV according to patient size and/or use of iterative reconstruction technique. CONTRAST:  OMNIPAQUE IOHEXOL 300 MG/ML  SOLN COMPARISON:  Noncontrast CT on 06/15/2010 FINDINGS: Lower Chest: No acute findings. Hepatobiliary: No suspicious hepatic masses identified. Gallbladder is unremarkable. No evidence of biliary ductal dilatation. Pancreas:  No mass or inflammatory changes. Spleen: Within normal limits in size and appearance. Adrenals/Urinary Tract: No suspicious masses identified. No evidence of ureteral calculi or hydronephrosis. Stomach/Bowel: Normal appendix visualized. Moderate to severe wall thickening is seen involving the sigmoid colon with surrounding pericolonic inflammatory changes. No definite diverticular disease identified by CT. Extraluminal gas and fluid collection is seen within the central sigmoid mesocolon, measuring 3.3 x 3.2 cm. This is consistent with sigmoid diverticulitis or colitis with pericolonic abscess. No evidence of free  intraperitoneal air. Vascular/Lymphatic: No pathologically enlarged lymph nodes. No acute vascular findings. Reproductive:  No mass or other significant abnormality. Other:  None. Musculoskeletal:  No suspicious bone lesions identified. IMPRESSION: Perforated sigmoid colitis or diverticulitis, with 3 cm pericolonic abscess in sigmoid mesocolon. Electronically Signed   By: Danae Orleans M.D.   On: 07/06/2023 13:58    Review of Systems  Gastrointestinal:  Positive for abdominal pain, nausea and vomiting.  All other systems reviewed and are negative.  Blood pressure (!) 143/87, pulse 86, temperature 98.6 F (37 C), temperature source Oral, resp. rate 18, height 6\' 2"  (1.88 m), weight 131.1 kg, SpO2 100%. Physical Exam HENT:     Head: Normocephalic.  Cardiovascular:     Rate and Rhythm: Normal rate.  Pulmonary:     Effort: Pulmonary effort is normal.  Abdominal:     Tenderness: There is abdominal tenderness in the suprapubic area and left lower quadrant. There is no guarding or rebound.     Hernia: No hernia is present.  Skin:    General: Skin is warm and dry.  Neurological:     Mental Status: He is alert.     Assessment/Plan: Acute diverticulitis with microperforation and small abscess  Continue IV antibiotics  Recommend IR consultation to see if he is a candidate for percutaneous drainage  Ice chips fine for tonight.  If his pain improves, may start clears in a.m.  High complexity  Dortha Schwalbe MD 07/06/2023, 6:37 PM

## 2023-07-06 NOTE — H&P (Signed)
PCP:   Cory Allan, MD   Chief Complaint:  Abdominal pain.  HPI: This is a 42 year old male with past medical history of anxiety and depression, nephrolithiasis, vertigo and morbid obesity.  He presents to the ER with abdominal pain complaints present for approximately a week.  Last Sunday he developed nausea vomiting, he took meds and stayed in bed Monday, Tuesday and Wednesday.  During that time his abdomen hurt really bad however he thought it was a muscle strain as he has had these before.  He went to work on Thursday and Friday, he was in a lot of pain.  He took Tylenol which helped but did not relieve the pain.  Today he woke up with the pain was severe, he came to the ER.  Per patient he had nausea, vomiting and fever the first day.  He denies diarrhea.  He describes the pain as sharp and continuous.  At its worst 10/10, currently 9/10.  He took a COVID test at home that was negative.  He has never had this abdominal pain before.  He has no prior history of colonoscopy.  There is no blood in his emesis or stool.  Per patient walk-in did make the pain worse at first however after 10 minutes the pain improved.  In the ER CT abdomen and pelvis shows perforated sigmoid colitis or diverticulitis.  With 3 cm pericolonic mass in sigmoid colon. Surgery seen the patient.  Review of Systems:  Per HPI  Past Medical History: Past Medical History:  Diagnosis Date   ADHD (attention deficit hyperactivity disorder)    Annual physical exam 09/09/2019   Anxiety and depression    COVID-19    12/28/19   Depression, recurrent (HCC) 09/09/2019   Elevated liver enzymes 06/11/2019   Excessive ear wax, left 09/09/2019   Headache 12/31/2021   Hypertriglyceridemia 07/17/2021   Insomnia    Insomnia    Inverted nipple 09/16/2019   Kidney stone    Mastoiditis of both sides    Nystagmus 01/22/2022   Obesity (BMI 30-39.9)    Obesity (BMI 30-39.9)    Prediabetes    Suicidal ideation 07/14/2021   Vertigo     Past Surgical History:  Procedure Laterality Date   NO PAST SURGERIES      Medications: Prior to Admission medications   Medication Sig Start Date End Date Taking? Authorizing Provider  acetaminophen (TYLENOL) 500 MG tablet Take 500-1,000 mg by mouth every 6 (six) hours as needed for mild pain or headache. 02/12/22  Yes [provider]  buPROPion (WELLBUTRIN XL) 150 MG 24 hr tablet Take 1 tablet (150 mg total) by mouth every morning. 05/21/23  Yes Stasia Cavalier, MD  DULoxetine (CYMBALTA) 30 MG capsule Take 3 capsules (90 mg total) by mouth daily. 05/21/23 08/19/23 Yes Stasia Cavalier, MD  eszopiclone (LUNESTA) 2 MG TABS tablet Take 1 tablet (2 mg total) by mouth at bedtime as needed for sleep. Take immediately before bedtime Patient taking differently: Take 2 mg by mouth at bedtime. Take immediately before bedtime 05/21/23 05/20/24 Yes Stasia Cavalier, MD  Loratadine 5 MG TBDP Take 1 tablet (5 mg total) by mouth daily. Patient taking differently: Take 5 mg by mouth daily as needed (for allergies or sinus issues). 12/03/22  Yes Cory Allan, MD  meclizine (ANTIVERT) 25 MG tablet Take 25 mg by mouth 3 (three) times daily as needed for dizziness.   Yes [provider]  Vitamin D, Ergocalciferol, (DRISDOL) 1.25 MG (50000 UNIT)  CAPS capsule Take 1 capsule (50,000 Units total) by mouth every 7 (seven) days. Patient taking differently: Take 50,000 Units by mouth every Saturday. 05/09/23  Yes Cory Allan, MD    Allergies:  No Known Allergies  Social History:  reports that he has never smoked. He has never used smokeless tobacco. He reports that he does not currently use alcohol. He reports that he does not currently use drugs.  Family History: Family History  Problem Relation Age of Onset   Diabetes Mother    Hypertension Mother    Hyperlipidemia Mother    Diabetes Mellitus II Mother    Anxiety disorder Mother    Depression Mother    Stroke Father    Alcoholism Father     Anxiety disorder Brother    Depression Brother    Hypertension Brother    Hyperlipidemia Brother    Diabetes type II Brother    ADD / ADHD Brother    Dementia Maternal Grandfather     Physical Exam: Vitals:   07/06/23 1530 07/06/23 1545 07/06/23 1650 07/06/23 2048  BP: 136/84 121/81 (!) 143/87 130/82  Pulse: 93 90 86 93  Resp:  17 18 16   Temp:   98.6 F (37 C) 99 F (37.2 C)  TempSrc:   Oral Oral  SpO2: 100% 99% 100% 98%  Weight:      Height:        General:  Alert and oriented times three, morbid obesity, no acute distress Eyes: Pink conjunctiva, no scleral icterus ENT: Moist oral mucosa, neck supple, no thyromegaly Lungs: CTA B/L, no wheeze, no crackles, no use of accessory muscles Cardiovascular: RRR, no regurgitation, no gallops, no murmurs. No carotid bruits, no JVD Abdomen: soft, positive BS, TTP B/L LQ, not an acute abdomen GU: not examined Neuro: CN II - XII grossly intact, sensation intact Musculoskeletal: strength 5/5 all extremities, no edema Skin: no rash or subcutaneous crepitation, no decubitus Psych: appropriate patient  Labs on Admission:  Recent Labs    07/06/23 1236 07/06/23 2015  NA 135  --   K 3.5  --   CL 104  --   CO2 24  --   GLUCOSE 106*  --   BUN 15  --   CREATININE 0.78 0.88  CALCIUM 8.5*  --    Recent Labs    07/06/23 1236  AST 18  ALT 23  ALKPHOS 52  BILITOT 0.4  PROT 7.0  ALBUMIN 3.2*   Recent Labs    07/06/23 1236  LIPASE 30   Recent Labs    07/06/23 1236 07/06/23 2015  WBC 8.1 8.8  HGB 10.2* 9.8*  HCT 31.2* 31.2*  MCV 80.2 83.9  PLT 297 297    Micro Results: Recent Results (from the past 240 hour(s))  SARS Coronavirus 2 by RT PCR (hospital order, performed in [facility] hospital lab) *cepheid single result test* Anterior Nasal Swab     Status: None   Collection Time: 07/06/23 12:36 PM   Specimen: Anterior Nasal Swab  Result Value Ref Range Status   SARS Coronavirus 2 by RT PCR NEGATIVE NEGATIVE Final     Comment: (NOTE) SARS-CoV-2 target nucleic acids are NOT DETECTED.  The SARS-CoV-2 RNA is generally detectable in upper and lower respiratory specimens during the acute phase of infection. The lowest concentration of SARS-CoV-2 viral copies this assay can detect is 250 copies / mL. A negative result does not preclude SARS-CoV-2 infection and should not be used as the sole basis for  treatment or other patient management decisions.  A negative result may occur with improper specimen collection / handling, submission of specimen other than nasopharyngeal swab, presence of viral mutation(s) within the areas targeted by this assay, and inadequate number of viral copies (<250 copies / mL). A negative result must be combined with clinical observations, patient history, and epidemiological information.  Fact Sheet for Patients:   RoadLapTop.co.za  Fact Sheet for Healthcare Providers: http://kim-miller.com/  This test is not yet approved or  cleared by the [place] FDA and has been authorized for detection and/or diagnosis of SARS-CoV-2 by FDA under an Emergency Use Authorization (EUA).  This EUA will remain in effect (meaning this test can be used) for the duration of the COVID-19 declaration under Section 564(b)(1) of the Act, 21 U.S.C. section 360bbb-3(b)(1), unless the authorization is terminated or revoked sooner.  Performed at [facility], [address] [place], [place] [address]      Radiological Exams on Admission: CT ABDOMEN PELVIS W CONTRAST  Result Date: 07/06/2023 CLINICAL DATA:  Acute lower abdominal pain. EXAM: CT ABDOMEN AND PELVIS WITH CONTRAST TECHNIQUE: Multidetector CT imaging of the abdomen and pelvis was performed using the standard protocol following bolus administration of intravenous contrast. RADIATION DOSE REDUCTION: This exam was performed according to the departmental dose-optimization program  which includes automated exposure control, adjustment of the mA and/or kV according to patient size and/or use of iterative reconstruction technique. CONTRAST:  OMNIPAQUE IOHEXOL 300 MG/ML  SOLN COMPARISON:  Noncontrast CT on 06/15/2010 FINDINGS: Lower Chest: No acute findings. Hepatobiliary: No suspicious hepatic masses identified. Gallbladder is unremarkable. No evidence of biliary ductal dilatation. Pancreas:  No mass or inflammatory changes. Spleen: Within normal limits in size and appearance. Adrenals/Urinary Tract: No suspicious masses identified. No evidence of ureteral calculi or hydronephrosis. Stomach/Bowel: Normal appendix visualized. Moderate to severe wall thickening is seen involving the sigmoid colon with surrounding pericolonic inflammatory changes. No definite diverticular disease identified by CT. Extraluminal gas and fluid collection is seen within the central sigmoid mesocolon, measuring 3.3 x 3.2 cm. This is consistent with sigmoid diverticulitis or colitis with pericolonic abscess. No evidence of free intraperitoneal air. Vascular/Lymphatic: No pathologically enlarged lymph nodes. No acute vascular findings. Reproductive:  No mass or other significant abnormality. Other:  None. Musculoskeletal:  No suspicious bone lesions identified. IMPRESSION: Perforated sigmoid colitis or diverticulitis, with 3 cm pericolonic abscess in sigmoid mesocolon. Electronically Signed   By: Danae Orleans M.D.   On: 07/06/2023 13:58    Assessment/Plan Present on Admission:  Perforated sigmoid colon (HCC) -N.p.o., IV fluid hydration -Zosyn pharmacy to dose -IR consult placed for question of abscess drainage -PRN pain medications -Appreciate surgery's input  Anxiety and depression - resume Wellbutrin and duloxetine  Vertigo -Resume meclizine as needed  H/o kidney stones Morbid Obesity  Cory Gomez 07/06/2023, 11:04 PM

## 2023-07-06 NOTE — ED Provider Notes (Signed)
[place] [facility] Provider Note   CSN: [id number] Arrival date & time: 07/06/23  1058     History  Chief Complaint  Patient presents with   Abdominal Pain    Cory Gomez is a 42 y.o. male.   Abdominal Pain    42 year old male with medical history significant for obesity, anxiety, depression, ADHD, kidney stones who presents to the emergency department with lower abdominal pain for the past 5 days.  He states that he had cough congestion as well as vomiting and diarrhea that resolved this past Thursday.  For the last 5 days he has had progressive worsening lower abdominal pain.  He states that initially presented as periumbilical pain radiating to the right lower quadrant but now is in the bilateral lower abdomen.  He is currently tolerating oral intake.  No urinary symptoms.  He states that it feels different from his prior episode of a kidney stone.  Home Medications Prior to Admission medications   Medication Sig Start Date End Date Taking? Authorizing Provider  acetaminophen (TYLENOL) 500 MG tablet Take by mouth. 02/12/22   [provider]  buPROPion (WELLBUTRIN XL) 150 MG 24 hr tablet Take 1 tablet (150 mg total) by mouth every morning. 05/21/23   Stasia Cavalier, MD  DULoxetine (CYMBALTA) 30 MG capsule Take 3 capsules (90 mg total) by mouth daily. 05/21/23 08/19/23  Stasia Cavalier, MD  eszopiclone (LUNESTA) 2 MG TABS tablet Take 1 tablet (2 mg total) by mouth at bedtime as needed for sleep. Take immediately before bedtime 05/21/23 05/20/24  Stasia Cavalier, MD  ketoconazole (NIZORAL) 2 % shampoo  01/11/22   [provider]  Loratadine 5 MG TBDP Take 1 tablet (5 mg total) by mouth daily. 12/03/22   [name], MD  meclizine (ANTIVERT) 25 MG tablet Take by mouth. 01/11/22   [provider]  Vitamin D, Ergocalciferol, (DRISDOL) 1.25 MG (50000 UNIT) CAPS capsule Take 1 capsule (50,000 Units total) by mouth every  7 (seven) days. 05/09/23   [name], MD      Allergies    Patient has no known allergies.    Review of Systems   Review of Systems  Gastrointestinal:  Positive for abdominal pain.  All other systems reviewed and are negative.   Physical Exam Updated Vital Signs BP 135/81   Pulse 79   Temp 99.4 F (37.4 C)   Resp 18   Ht 6\' 2"  (1.88 m)   Wt 131.1 kg   SpO2 100%   BMI 37.11 kg/m  Physical Exam Vitals and nursing note reviewed.  Constitutional:      General: He is not in acute distress.    Appearance: He is well-developed. He is obese.  HENT:     Head: Normocephalic and atraumatic.  Eyes:     Conjunctiva/sclera: Conjunctivae normal.  Cardiovascular:     Rate and Rhythm: Normal rate and regular rhythm.     Heart sounds: No murmur heard. Pulmonary:     Effort: Pulmonary effort is normal. No respiratory distress.     Breath sounds: Normal breath sounds.  Abdominal:     Palpations: Abdomen is soft.     Tenderness: There is generalized abdominal tenderness and tenderness in the right lower quadrant, suprapubic area and left lower quadrant. There is no guarding or rebound.  Musculoskeletal:        General: No swelling.     Cervical back: Neck supple.  Skin:  General: Skin is warm and dry.     Capillary Refill: Capillary refill takes less than 2 seconds.  Neurological:     Mental Status: He is alert.  Psychiatric:        Mood and Affect: Mood normal.     ED Results / Procedures / Treatments   Labs (all labs ordered are listed, but only abnormal results are displayed) Labs Reviewed  COMPREHENSIVE METABOLIC PANEL - Abnormal; Notable for the following components:      Result Value   Glucose, Bld 106 (*)    Calcium 8.5 (*)    Albumin 3.2 (*)    All other components within normal limits  CBC - Abnormal; Notable for the following components:   RBC 3.89 (*)    Hemoglobin 10.2 (*)    HCT 31.2 (*)    All other components within normal limits  SARS CORONAVIRUS 2  BY RT PCR  LIPASE, BLOOD  URINALYSIS, ROUTINE W REFLEX MICROSCOPIC    EKG None  Radiology CT ABDOMEN PELVIS W CONTRAST  Result Date: 07/06/2023 CLINICAL DATA:  Acute lower abdominal pain. EXAM: CT ABDOMEN AND PELVIS WITH CONTRAST TECHNIQUE: Multidetector CT imaging of the abdomen and pelvis was performed using the standard protocol following bolus administration of intravenous contrast. RADIATION DOSE REDUCTION: This exam was performed according to the departmental dose-optimization program which includes automated exposure control, adjustment of the mA and/or kV according to patient size and/or use of iterative reconstruction technique. CONTRAST:  OMNIPAQUE IOHEXOL 300 MG/ML  SOLN COMPARISON:  Noncontrast CT on 06/15/2010 FINDINGS: Lower Chest: No acute findings. Hepatobiliary: No suspicious hepatic masses identified. Gallbladder is unremarkable. No evidence of biliary ductal dilatation. Pancreas:  No mass or inflammatory changes. Spleen: Within normal limits in size and appearance. Adrenals/Urinary Tract: No suspicious masses identified. No evidence of ureteral calculi or hydronephrosis. Stomach/Bowel: Normal appendix visualized. Moderate to severe wall thickening is seen involving the sigmoid colon with surrounding pericolonic inflammatory changes. No definite diverticular disease identified by CT. Extraluminal gas and fluid collection is seen within the central sigmoid mesocolon, measuring 3.3 x 3.2 cm. This is consistent with sigmoid diverticulitis or colitis with pericolonic abscess. No evidence of free intraperitoneal air. Vascular/Lymphatic: No pathologically enlarged lymph nodes. No acute vascular findings. Reproductive:  No mass or other significant abnormality. Other:  None. Musculoskeletal:  No suspicious bone lesions identified. IMPRESSION: Perforated sigmoid colitis or diverticulitis, with 3 cm pericolonic abscess in sigmoid mesocolon. Electronically Signed   By: Danae Orleans M.D.    On: 07/06/2023 13:58    Procedures .Critical Care  Performed by: Ernie Avena, MD Authorized by: Ernie Avena, MD   Critical care provider statement:    Critical care time (minutes):  30   Critical care was time spent personally by me on the following activities:  Development of treatment plan with patient or surrogate, discussions with consultants, evaluation of patient's response to treatment, examination of patient, ordering and review of laboratory studies, ordering and review of radiographic studies, ordering and performing treatments and interventions, pulse oximetry, re-evaluation of patient's condition and review of old charts   Care discussed with: admitting provider       Medications Ordered in ED Medications  lactated ringers bolus 1,000 mL (1,000 mLs Intravenous New Bag/Given 07/06/23 1241)  ondansetron (ZOFRAN) injection 4 mg (4 mg Intravenous Given 07/06/23 1236)  HYDROmorphone (DILAUDID) injection 1 mg (1 mg Intravenous Given 07/06/23 1236)  iohexol (OMNIPAQUE) 300 MG/ML solution 100 mL (100 mLs Intravenous Contrast Given 07/06/23 1337)  ED Course/ Medical Decision Making/ A&P                             Medical Decision Making Amount and/or Complexity of Data Reviewed Labs: ordered. Radiology: ordered.  Risk Prescription drug management. Decision regarding hospitalization.    42 year old male with medical history significant for obesity, anxiety, depression, ADHD, kidney stones who presents to the emergency department with lower abdominal pain for the past 5 days.  He states that he had cough congestion as well as vomiting and diarrhea that resolved this past Thursday.  For the last 5 days he has had progressive worsening lower abdominal pain.  He states that initially presented as periumbilical pain radiating to the right lower quadrant but now is in the bilateral lower abdomen.  He is currently tolerating oral intake.  No urinary symptoms.  He states that it  feels different from his prior episode of a kidney stone.  On arrival, the patient was afebrile, not tachycardic or tachypneic, BP 135/81, saturating 100% on room air.  Physical exam significant for abdominal tenderness to palpation in the bilateral lower abdomen, no rebound or guarding.  Reviewed and confirmed nursing documentation for past medical history, family history, social history.    Initial Assessment:   With the patient's presentation of abdominal pain, most likely diagnosis is viral illness, diverticulitis, appendicitis. Other diagnoses were considered including (but not limited to) gastroenteritis, colitis, small bowel obstruction, cholecystitis, pancreatitis, nephrolithiasis, UTI, pyelonephritis. These are considered less likely due to history of present illness and physical exam findings.      Initial Plan:  CBC/CMP to evaluate for underlying infectious/metabolic etiology for patient's abdominal pain  Lipase to evaluate for pancreatitis  EKG to evaluate for cardiac source of pain  CTAB/Pelvis with contrast to evaluate for structural/surgical etiology of patients' severe abdominal pain.  Urinalysis and repeat physical assessment to evaluate for UTI/Pyelonpehritis  Empiric management of symptoms with escalating pain control and antiemetics as needed.   Initial Study Results:   Laboratory  All laboratory results reviewed without evidence of clinically relevant pathology.   Exceptions include: CBC without a leukocytosis, anemia noted to 10.2, CMP, COVID-19 testing, urinalysis unremarkable   Radiology All images reviewed independently. Agree with radiology report at this time.   CT ABDOMEN PELVIS W CONTRAST  Result Date: 07/06/2023 CLINICAL DATA:  Acute lower abdominal pain. EXAM: CT ABDOMEN AND PELVIS WITH CONTRAST TECHNIQUE: Multidetector CT imaging of the abdomen and pelvis was performed using the standard protocol following bolus administration of intravenous contrast.  RADIATION DOSE REDUCTION: This exam was performed according to the departmental dose-optimization program which includes automated exposure control, adjustment of the mA and/or kV according to patient size and/or use of iterative reconstruction technique. CONTRAST:  OMNIPAQUE IOHEXOL 300 MG/ML  SOLN COMPARISON:  Noncontrast CT on 06/15/2010 FINDINGS: Lower Chest: No acute findings. Hepatobiliary: No suspicious hepatic masses identified. Gallbladder is unremarkable. No evidence of biliary ductal dilatation. Pancreas:  No mass or inflammatory changes. Spleen: Within normal limits in size and appearance. Adrenals/Urinary Tract: No suspicious masses identified. No evidence of ureteral calculi or hydronephrosis. Stomach/Bowel: Normal appendix visualized. Moderate to severe wall thickening is seen involving the sigmoid colon with surrounding pericolonic inflammatory changes. No definite diverticular disease identified by CT. Extraluminal gas and fluid collection is seen within the central sigmoid mesocolon, measuring 3.3 x 3.2 cm. This is consistent with sigmoid diverticulitis or colitis with pericolonic abscess. No evidence of free  intraperitoneal air. Vascular/Lymphatic: No pathologically enlarged lymph nodes. No acute vascular findings. Reproductive:  No mass or other significant abnormality. Other:  None. Musculoskeletal:  No suspicious bone lesions identified. IMPRESSION: Perforated sigmoid colitis or diverticulitis, with 3 cm pericolonic abscess in sigmoid mesocolon. Electronically Signed   By: Danae Orleans M.D.   On: 07/06/2023 13:58     Final Reassessment and Plan:   CT imaging revealed perforated sigmoid colitis or diverticulitis with a 3 cm pericolonic abscess in the sigmoid mesocolon.  The patient was informed of the results of this diagnostic testing.  IV Zosyn was ordered.  Hospitalist medicine was consulted for admission.  General surgery was consulted for further recommendations.  I spoke with Dr.  Luisa Hart of general surgery who will see the patient in consultation, agreed with the plan of care and recommended medicine admission. Hospitalist medicine consulted for admission, Dr. Blake Divine accepting.  Final Clinical Impression(s) / ED Diagnoses Final diagnoses:  Generalized abdominal pain  Diverticulitis of large intestine with abscess, unspecified bleeding status    Rx / DC Orders ED Discharge Orders     None         Ernie Avena, MD 07/06/23 1419

## 2023-07-06 NOTE — ED Notes (Signed)
Called [facility] for transport at 2:55.

## 2023-07-07 DIAGNOSIS — R1084 Generalized abdominal pain: Secondary | ICD-10-CM

## 2023-07-07 DIAGNOSIS — K631 Perforation of intestine (nontraumatic): Secondary | ICD-10-CM

## 2023-07-07 LAB — BASIC METABOLIC PANEL
Anion gap: 7 (ref 5–15)
BUN: 12 mg/dL (ref 6–20)
CO2: 23 mmol/L (ref 22–32)
Calcium: 8.2 mg/dL — ABNORMAL LOW (ref 8.9–10.3)
Chloride: 107 mmol/L (ref 98–111)
Creatinine, Ser: 0.82 mg/dL (ref 0.61–1.24)
GFR, Estimated: >60 mL/min (ref >60)
Glucose, Bld: 96 mg/dL (ref 70–99)
Potassium: 3.6 mmol/L (ref 3.5–5.1)
Sodium: 137 mmol/L (ref 135–145)

## 2023-07-07 LAB — CBC WITH DIFFERENTIAL/PLATELET
Abs Immature Granulocytes: 0.03 10*3/uL (ref 0.00–0.07)
Basophils Absolute: 0.1 10*3/uL (ref 0.0–0.1)
Basophils Relative: 1 %
Eosinophils Absolute: 0.2 10*3/uL (ref 0.0–0.5)
Eosinophils Relative: 2 %
HCT: 30.6 % — ABNORMAL LOW (ref 39.0–52.0)
Hemoglobin: 9.5 g/dL — ABNORMAL LOW (ref 13.0–17.0)
Immature Granulocytes: 0 %
Lymphocytes Relative: 15 %
Lymphs Abs: 1.2 10*3/uL (ref 0.7–4.0)
MCH: 26 pg (ref 26.0–34.0)
MCHC: 31 g/dL (ref 30.0–36.0)
MCV: 83.8 fL (ref 80.0–100.0)
Monocytes Absolute: 0.8 10*3/uL (ref 0.1–1.0)
Monocytes Relative: 9 %
Neutro Abs: 6 10*3/uL (ref 1.7–7.7)
Neutrophils Relative %: 73 %
Platelets: 278 10*3/uL (ref 150–400)
RBC: 3.65 MIL/uL — ABNORMAL LOW (ref 4.22–5.81)
RDW: 13.2 % (ref 11.5–15.5)
WBC: 8.3 10*3/uL (ref 4.0–10.5)
nRBC: 0 % (ref 0.0–0.2)

## 2023-07-07 LAB — HIV ANTIBODY (ROUTINE TESTING W REFLEX): HIV Screen 4th Generation wRfx: NONREACTIVE

## 2023-07-07 LAB — PROTIME-INR
INR: 1.2 (ref 0.8–1.2)
Prothrombin Time: 15.1 seconds (ref 11.4–15.2)

## 2023-07-07 MED ORDER — SODIUM CHLORIDE 0.9 % IV SOLN
INTRAVENOUS | Status: DC | PRN
  Administered 2023-07-07: 10 mL via INTRAVENOUS

## 2023-07-07 NOTE — Progress Notes (Signed)
[facility] PROGRESS NOTE    Progress Note  Cory Gomez  WUJ:[id number] DOB: Oct 01, 1981 DOA: 07/06/2023 PCP: [name], MD     Brief Narrative:   Cory Gomez is an 42 y.o. male past medical history significant for anxiety and depression comes into the ER complaining of abdominal Gomez for approximately 1 week.  She was able to go to work on Sunday admission when he came into the ED because he was having nausea and fever   Assessment/Plan:   Acute diverticulitis with microperforation sigmoid colon (HCC) Currently n.p.o. started on IV Zosyn and IV fluids.  Has remained afebrile with no leukocytosis General surgery was consulted agree with IV antibiotics and fluids consult IR. IR was consulted for percutaneous drainage.  Anxiety and depression: Continue Wellbutrin and duloxetine.  Vertigo: Continue meclizine as needed.   DVT prophylaxis: lovenox Family Communication:non Status is: Inpatient Remains inpatient appropriate because: Diverticulitis with microperforation    Code Status:     Code Status Orders  (From admission, onward)           Start     Ordered   07/06/23 1920  Full code  Continuous       Question:  By:  Answer:  Consent: discussion documented in EHR   07/06/23 1920           Code Status History     Date Active Date Inactive Code Status Order ID Comments User Context   12/30/2021 1756 01/01/2022 2155 Full Code [id number]  Rodolph Bong, MD ED         IV Access:   Peripheral IV   Procedures and diagnostic studies:   CT ABDOMEN PELVIS W CONTRAST  Result Date: 07/06/2023 CLINICAL DATA:  Acute lower abdominal Gomez. EXAM: CT ABDOMEN AND PELVIS WITH CONTRAST TECHNIQUE: Multidetector CT imaging of the abdomen and pelvis was performed using the standard protocol following bolus administration of intravenous contrast. RADIATION DOSE REDUCTION: This exam was performed according to the departmental dose-optimization  program which includes automated exposure control, adjustment of the mA and/or kV according to patient size and/or use of iterative reconstruction technique. CONTRAST:  OMNIPAQUE IOHEXOL 300 MG/ML  SOLN COMPARISON:  Noncontrast CT on 06/15/2010 FINDINGS: Lower Chest: No acute findings. Hepatobiliary: No suspicious hepatic masses identified. Gallbladder is unremarkable. No evidence of biliary ductal dilatation. Pancreas:  No mass or inflammatory changes. Spleen: Within normal limits in size and appearance. Adrenals/Urinary Tract: No suspicious masses identified. No evidence of ureteral calculi or hydronephrosis. Stomach/Bowel: Normal appendix visualized. Moderate to severe wall thickening is seen involving the sigmoid colon with surrounding pericolonic inflammatory changes. No definite diverticular disease identified by CT. Extraluminal gas and fluid collection is seen within the central sigmoid mesocolon, measuring 3.3 x 3.2 cm. This is consistent with sigmoid diverticulitis or colitis with pericolonic abscess. No evidence of free intraperitoneal air. Vascular/Lymphatic: No pathologically enlarged lymph nodes. No acute vascular findings. Reproductive:  No mass or other significant abnormality. Other:  None. Musculoskeletal:  No suspicious bone lesions identified. IMPRESSION: Perforated sigmoid colitis or diverticulitis, with 3 cm pericolonic abscess in sigmoid mesocolon. Electronically Signed   By: Danae Orleans M.D.   On: 07/06/2023 13:58     Medical Consultants:   None.   Subjective:    Cory Gomez is controlled anorexic  Objective:    Vitals:   07/06/23 1650 07/06/23 2048 07/07/23 0138 07/07/23 0506  BP: (!) 143/87 130/82 123/72 134/76  Pulse: 86 93 79  69  Resp: 18 16 17 18   Temp: 98.6 F (37 C) 99 F (37.2 C) 98.6 F (37 C) 98.4 F (36.9 C)  TempSrc: Oral Oral Oral Oral  SpO2: 100% 98% 98% 98%  Weight:      Height:       SpO2: 98 %   Intake/Output Summary  (Last 24 hours) at 07/07/2023 0806 Last data filed at 07/07/2023 0600 Gross per 24 hour  Intake 1127.01 ml  Output 350 ml  Net 777.01 ml   Filed Weights   07/06/23 1103 07/06/23 1112  Weight: 131.1 kg 131.1 kg    Exam: General exam: In no acute distress. Respiratory system: Good air movement and clear to auscultation. Cardiovascular system: S1 & S2 heard, RRR. No JVD. Gastrointestinal system: Abdomen is nondistended, soft and nontender.  Extremities: No pedal edema. Skin: No rashes, lesions or ulcers Psychiatry: Judgement and insight appear normal. Mood & affect appropriate.    Data Reviewed:    Labs: Basic Metabolic Panel: Recent Labs  Lab 07/06/23 1236 07/06/23 2015 07/07/23 0422  NA 135  --  137  K 3.5  --  3.6  CL 104  --  107  CO2 24  --  23  GLUCOSE 106*  --  96  BUN 15  --  12  CREATININE 0.78 0.88 0.82  CALCIUM 8.5*  --  8.2*   GFR Estimated Creatinine Clearance: 174.1 mL/min (by C-G formula based on SCr of 0.82 mg/dL). Liver Function Tests: Recent Labs  Lab 07/06/23 1236  AST 18  ALT 23  ALKPHOS 52  BILITOT 0.4  PROT 7.0  ALBUMIN 3.2*   Recent Labs  Lab 07/06/23 1236  LIPASE 30   No results for input(s): "AMMONIA" in the last 168 hours. Coagulation profile Recent Labs  Lab 07/07/23 0422  INR 1.2   COVID-19 Labs  No results for input(s): "DDIMER", "FERRITIN", "LDH", "CRP" in the last 72 hours.  Lab Results  Component Value Date   SARSCOV2NAA NEGATIVE 07/06/2023   SARSCOV2NAA NEGATIVE 11/25/2022   SARSCOV2NAA NEGATIVE 12/29/2021   SARSCOV2NAA NEGATIVE 12/25/2021    CBC: Recent Labs  Lab 07/06/23 1236 07/06/23 2015 07/07/23 0422  WBC 8.1 8.8 8.3  NEUTROABS  --   --  6.0  HGB 10.2* 9.8* 9.5*  HCT 31.2* 31.2* 30.6*  MCV 80.2 83.9 83.8  PLT 297 297 278   Cardiac Enzymes: No results for input(s): "CKTOTAL", "CKMB", "CKMBINDEX", "TROPONINI" in the last 168 hours. BNP (last 3 results) No results for input(s): "PROBNP" in the  last 8760 hours. CBG: No results for input(s): "GLUCAP" in the last 168 hours. D-Dimer: No results for input(s): "DDIMER" in the last 72 hours. Hgb A1c: No results for input(s): "HGBA1C" in the last 72 hours. Lipid Profile: No results for input(s): "CHOL", "HDL", "LDLCALC", "TRIG", "CHOLHDL", "LDLDIRECT" in the last 72 hours. Thyroid function studies: No results for input(s): "TSH", "T4TOTAL", "T3FREE", "THYROIDAB" in the last 72 hours.  Invalid input(s): "FREET3" Anemia work up: No results for input(s): "VITAMINB12", "FOLATE", "FERRITIN", "TIBC", "IRON", "RETICCTPCT" in the last 72 hours. Sepsis Labs: Recent Labs  Lab 07/06/23 1236 07/06/23 2015 07/07/23 0422  WBC 8.1 8.8 8.3   Microbiology Recent Results (from the past 240 hour(s))  SARS Coronavirus 2 by RT PCR (hospital order, performed in [facility] hospital lab) *cepheid single result test* Anterior Nasal Swab     Status: None   Collection Time: 07/06/23 12:36 PM   Specimen: Anterior Nasal Swab  Result Value Ref  Range Status   SARS Coronavirus 2 by RT PCR NEGATIVE NEGATIVE Final    Comment: (NOTE) SARS-CoV-2 target nucleic acids are NOT DETECTED.  The SARS-CoV-2 RNA is generally detectable in upper and lower respiratory specimens during the acute phase of infection. The lowest concentration of SARS-CoV-2 viral copies this assay can detect is 250 copies / mL. A negative result does not preclude SARS-CoV-2 infection and should not be used as the sole basis for treatment or other patient management decisions.  A negative result may occur with improper specimen collection / handling, submission of specimen other than nasopharyngeal swab, presence of viral mutation(s) within the areas targeted by this assay, and inadequate number of viral copies (<250 copies / mL). A negative result must be combined with clinical observations, patient history, and epidemiological information.  Fact Sheet for Patients:    RoadLapTop.co.za  Fact Sheet for Healthcare Providers: http://kim-miller.com/  This test is not yet approved or  cleared by the [place] FDA and has been authorized for detection and/or diagnosis of SARS-CoV-2 by FDA under an Emergency Use Authorization (EUA).  This EUA will remain in effect (meaning this test can be used) for the duration of the COVID-19 declaration under Section 564(b)(1) of the Act, 21 U.S.C. section 360bbb-3(b)(1), unless the authorization is terminated or revoked sooner.  Performed at [facility], [address] [place], [place] [address]      Medications:    buPROPion  150 mg Oral Daily   DULoxetine  90 mg Oral Daily   enoxaparin (LOVENOX) injection  60 mg Subcutaneous QHS   zolpidem  5 mg Oral QHS   Continuous Infusions:  0.9 % NaCl with KCl 20 mEq / L Stopped (07/06/23 2117)   piperacillin-tazobactam (ZOSYN)  IV 3.375 g (07/07/23 0517)      LOS: 1 day   Marinda Elk  [facility]  07/07/2023, 8:06 AM

## 2023-07-07 NOTE — Plan of Care (Signed)
  Problem: Education: Goal: Knowledge of General Education information will improve Description: Including pain rating scale, medication(s)/side effects and non-pharmacologic comfort measures Outcome: Progressing   Problem: Nutrition: Goal: Adequate nutrition will be maintained Outcome: Progressing   Problem: Pain Managment: Goal: General experience of comfort will improve Outcome: Progressing   Problem: Skin Integrity: Goal: Risk for impaired skin integrity will decrease Outcome: Progressing   

## 2023-07-07 NOTE — Progress Notes (Signed)
Subjective/Chief Complaint: Patient comfortable pain improved no nausea vomiting   Objective: Vital signs in last 24 hours: Temp:  [98.4 F (36.9 C)-99.6 F (37.6 C)] 98.4 F (36.9 C) (07/14 0506) Pulse Rate:  [69-105] 69 (07/14 0506) Resp:  [16-18] 18 (07/14 0506) BP: (115-143)/(64-116) 134/76 (07/14 0506) SpO2:  [98 %-100 %] 98 % (07/14 0506) Weight:  [131.1 kg] 131.1 kg (07/13 1112)    Intake/Output from previous day: 07/13 0701 - 07/14 0700 In: 1127 [I.V.:42.8; IV Piggyback:1084.2] Out: 350 [Urine:350] Intake/Output this shift: No intake/output data recorded.  GI: Obese but soft.  Less tenderness noted lower quadrants and in the suprapubic area  Lab Results:  Recent Labs    07/06/23 2015 07/07/23 0422  WBC 8.8 8.3  HGB 9.8* 9.5*  HCT 31.2* 30.6*  PLT 297 278   BMET Recent Labs    07/06/23 1236 07/06/23 2015 07/07/23 0422  NA 135  --  137  K 3.5  --  3.6  CL 104  --  107  CO2 24  --  23  GLUCOSE 106*  --  96  BUN 15  --  12  CREATININE 0.78 0.88 0.82  CALCIUM 8.5*  --  8.2*   PT/INR Recent Labs    07/07/23 0422  LABPROT 15.1  INR 1.2   ABG No results for input(s): "PHART", "HCO3" in the last 72 hours.  Invalid input(s): "PCO2", "PO2"  Studies/Results: CT ABDOMEN PELVIS W CONTRAST  Result Date: 07/06/2023 CLINICAL DATA:  Acute lower abdominal pain. EXAM: CT ABDOMEN AND PELVIS WITH CONTRAST TECHNIQUE: Multidetector CT imaging of the abdomen and pelvis was performed using the standard protocol following bolus administration of intravenous contrast. RADIATION DOSE REDUCTION: This exam was performed according to the departmental dose-optimization program which includes automated exposure control, adjustment of the mA and/or kV according to patient size and/or use of iterative reconstruction technique. CONTRAST:  OMNIPAQUE IOHEXOL 300 MG/ML  SOLN COMPARISON:  Noncontrast CT on 06/15/2010 FINDINGS: Lower Chest: No acute findings.  Hepatobiliary: No suspicious hepatic masses identified. Gallbladder is unremarkable. No evidence of biliary ductal dilatation. Pancreas:  No mass or inflammatory changes. Spleen: Within normal limits in size and appearance. Adrenals/Urinary Tract: No suspicious masses identified. No evidence of ureteral calculi or hydronephrosis. Stomach/Bowel: Normal appendix visualized. Moderate to severe wall thickening is seen involving the sigmoid colon with surrounding pericolonic inflammatory changes. No definite diverticular disease identified by CT. Extraluminal gas and fluid collection is seen within the central sigmoid mesocolon, measuring 3.3 x 3.2 cm. This is consistent with sigmoid diverticulitis or colitis with pericolonic abscess. No evidence of free intraperitoneal air. Vascular/Lymphatic: No pathologically enlarged lymph nodes. No acute vascular findings. Reproductive:  No mass or other significant abnormality. Other:  None. Musculoskeletal:  No suspicious bone lesions identified. IMPRESSION: Perforated sigmoid colitis or diverticulitis, with 3 cm pericolonic abscess in sigmoid mesocolon. Electronically Signed   By: Danae Orleans M.D.   On: 07/06/2023 13:58    Anti-infectives: Anti-infectives (From admission, onward)    Start     Dose/Rate Route Frequency Ordered Stop   07/06/23 2200  piperacillin-tazobactam (ZOSYN) IVPB 3.375 g        3.375 g 12.5 mL/hr over 240 Minutes Intravenous Every 8 hours 07/06/23 1851     07/06/23 1530  piperacillin-tazobactam (ZOSYN) IVPB 3.375 g        3.375 g 100 mL/hr over 30 Minutes Intravenous  Once 07/06/23 1521 07/06/23 1555       Assessment/Plan:  Patient Active  Problem List   Diagnosis Date Noted   Perforated sigmoid colon (HCC) 07/06/2023   Mood disorder (HCC) 06/08/2023   Annual physical exam 06/08/2023   Bilateral impacted cerumen 12/17/2022   Hypertension 01/11/2022   Sinusitis 12/30/2021   Morbid obesity with BMI of 40.0-44.9, adult (HCC) 07/17/2021    Hyperlipidemia 01/13/2020   Acne 09/09/2019   GAD (generalized anxiety disorder) 08/17/2019   ADD (attention deficit disorder) 07/24/2019   Binge eating disorder 07/24/2019   Vitamin D deficiency 06/11/2019   Prediabetes    Major depressive disorder, recurrent episode, in partial remission (HCC)      LOS: 1 day  Less pain today.  Try clear liquids today.  Continue antibiotics  Clovis Pu Yarel Rushlow MD 07/07/2023 Moderate complexity

## 2023-07-07 NOTE — Progress Notes (Signed)
Pharmacy Antibiotic Note  Cory Gomez is a 42 y.o. male admitted on 07/06/2023 with  diverticulitis with abscess .  Pharmacy has been consulted for Zosyn dosing.  ID: Acute diverticulitis with microperforation sigmoid colon  - TMax 99.6, WBC 8.3, Scr <1 (CrCl>100)   Zosyn 7/13>>  Plan: Zosyn 3.375g IV q8hr Pharmacy will sign off. Please reconsult for further dosing assitance.    Height: 6\' 2"  (188 cm) Weight: 131.1 kg (289 lb) IBW/kg (Calculated) : 82.2  Temp (24hrs), Avg:99.1 F (37.3 C), Min:98.4 F (36.9 C), Max:99.6 F (37.6 C)  Recent Labs  Lab 07/06/23 1236 07/06/23 2015 07/07/23 0422  WBC 8.1 8.8 8.3  CREATININE 0.78 0.88 0.82    Estimated Creatinine Clearance: 174.1 mL/min (by C-G formula based on SCr of 0.82 mg/dL).    No Known Allergies  [name] S. Merilynn Finland, PharmD, BCPS Clinical Staff Pharmacist [email or web address]  Pasty Spillers 07/07/2023 10:48 AM

## 2023-07-08 DIAGNOSIS — E669 Obesity, unspecified: Secondary | ICD-10-CM | POA: Insufficient documentation

## 2023-07-08 DIAGNOSIS — K631 Perforation of intestine (nontraumatic): Secondary | ICD-10-CM | POA: Diagnosis not present

## 2023-07-08 DIAGNOSIS — E66812 Obesity, class 2: Secondary | ICD-10-CM | POA: Insufficient documentation

## 2023-07-08 MED ORDER — ACETAMINOPHEN 325 MG PO TABS: 325.0000 mg | ORAL_TABLET | Freq: Four times a day (QID) | ORAL | Status: DC | PRN

## 2023-07-08 MED ORDER — MENTHOL 3 MG MT LOZG: 1.0000 | LOZENGE | OROMUCOSAL | Status: DC | PRN

## 2023-07-08 MED ORDER — MAGIC MOUTHWASH
15.0000 mL | Freq: Four times a day (QID) | ORAL | Status: DC | PRN
  Filled 2023-07-08: qty 15

## 2023-07-08 MED ORDER — SIMETHICONE 40 MG/0.6ML PO SUSP: 80.0000 mg | Freq: Four times a day (QID) | ORAL | Status: DC | PRN

## 2023-07-08 MED ORDER — PHENOL 1.4 % MT LIQD: 2.0000 | OROMUCOSAL | Status: DC | PRN

## 2023-07-08 MED ORDER — METHOCARBAMOL 1000 MG/10ML IJ SOLN
1000.0000 mg | Freq: Four times a day (QID) | INTRAVENOUS | Status: DC | PRN
  Filled 2023-07-08: qty 10

## 2023-07-08 MED ORDER — ALUM & MAG HYDROXIDE-SIMETH 200-200-20 MG/5ML PO SUSP: 30.0000 mL | Freq: Four times a day (QID) | ORAL | Status: DC | PRN

## 2023-07-08 MED ORDER — LACTATED RINGERS IV BOLUS: 1000.0000 mL | Freq: Three times a day (TID) | INTRAVENOUS | Status: DC | PRN

## 2023-07-08 NOTE — Plan of Care (Signed)
   Problem: Activity: Goal: Risk for activity intolerance will decrease Outcome: Adequate for Discharge   

## 2023-07-08 NOTE — Progress Notes (Signed)
Interventional Radiology Brief Update  42 year old male with pericolonic pelvic abscess.  No clinical signs of septic shock.  Currently on IV zosyn.     3.3 cm gas-containing abscess.  Unfortunately the abscess is guarded by surrounding bowel and vasculature, unable to access via percutaneous approach for drainage.  Recommend continued antibiotics and supportive care.  If clinical condition worsens, recommend repeat CT abdomen/pelvis with contrast and consult IR for re-evaluation.  Marliss Coots, MD Pager: [phone]

## 2023-07-08 NOTE — Progress Notes (Signed)
   07/08/23 1013  TOC Brief Assessment  Insurance and Status Reviewed  Patient has primary care physician Yes  Home environment has been reviewed home with spouse  Prior level of function: independent  Prior/Current Home Services No current home services  Social Determinants of Health Reivew SDOH reviewed no interventions necessary  Readmission risk has been reviewed Yes  Transition of care needs no transition of care needs at this time

## 2023-07-08 NOTE — Progress Notes (Signed)
07/08/2023  [name] [id number] 1981-03-10  CARE TEAM: PCP: [name], MD  Outpatient Care Team: Patient Care Team: [name], MD as PCP - General (Family Medicine)  Inpatient Treatment Team: Treatment Team:  Kathrynn Running, MD [facility], Md, MD Lilyan Gilford, MD Magier-Nyok, Christene Slates, NT Lucia Gaskins, [facility] Saddie Benders, RN Helyn Numbers, RN Anselm Pancoast, [place]   Problem List:   Principal Problem:   Perforated sigmoid colon [facility]) Active Problems:   Prediabetes   ADD (attention deficit disorder)   GAD (generalized anxiety disorder)   Hypertension   Obesity, Class II, BMI 35-39.9   * No surgery found *      Assessment [facility] Stay = 2 days)      First episode of complex diverticulitis with abscess    Plan:  Continue IV antibiotics.  Agree with piperacillin/tazobactam given complex attack.  Tolerating clear liquids.  Try dysphagia 1/full liquid diet today.  If improved may be can advance diet and transition to oral antibiotics with close outpatient follow-up.  Pathophysiology of diverticulitis with its natural history was discussed.  Given his young age with a complicated attack with abscess, I suspect his risk of recurrence and other issues is rather high and he could benefit from segmental sigmoid colectomy at some point.  Hopefully we can get this infection under control with close outpatient follow-up to consider colonoscopy and then robotic sigmoid colectomy in 6-8 weeks.  If he worsens or does not improve by hospital day #5, repeat CAT scan to see if he has progression in his abscess.  If the abscess is worse or not amenable drainage may need operative washout and possible Hartmann resection.  We will see.  He seems to be improving and ileus seems to be resolving so hopefully we can avoid urgent Hartmann resection at this time.  -VTE prophylaxis- SCDs, etc  -mobilize as tolerated to help recovery  -Disposition: To be determined.   Possibly 7/17 with oral antibiotics.       I reviewed nursing notes, hospitalist notes, last 24 h vitals and pain scores, last 48 h intake and output, last 24 h labs and trends, and last 24 h imaging results.  I have reviewed this patient's available data, including medical history, events of note, test results, etc as part of my evaluation.   A significant portion of that time was spent in counseling. Care during the described time interval was provided by me.  This care required moderate level of medical decision making.  07/08/2023    Subjective: (Chief complaint)  Pain less.  Wife at bedside.  Tolerating clear liquids.  No dysuria.  Objective:  Vital signs:  Vitals:   07/07/23 0506 07/07/23 1315 07/07/23 1941 07/08/23 0457  BP: 134/76 137/87 (!) 128/92 130/83  Pulse: 69 81 87 80  Resp: 18 18 16 19   Temp: 98.4 F (36.9 C) 98 F (36.7 C) 98.4 F (36.9 C) 97.8 F (36.6 C)  TempSrc: Oral Oral Oral Oral  SpO2: 98% 100% 99% 98%  Weight:      Height:        Last BM Date : 07/06/23  Intake/Output   Yesterday:  07/14 0701 - 07/15 0700 In: 3830.7 [P.O.:1080; I.V.:2584.9; IV Piggyback:165.8] Out: 350 [Urine:350] This shift:  No intake/output data recorded.  Bowel function:  Flatus: YES  BM:  No  Drain: (No drain)   Physical Exam:  General: Pt awake/alert in no acute distress Eyes: PERRL, normal EOM.  Sclera clear.  No icterus Neuro: CN II-XII intact w/o focal sensory/motor deficits. Lymph: No head/neck/groin lymphadenopathy Psych:  No delerium/psychosis/paranoia.  Oriented x 4 HENT: Normocephalic, Mucus membranes moist.  No thrush Neck: Supple, No tracheal deviation.  No obvious thyromegaly Chest: No pain to chest wall compression.  Good respiratory excursion.  No audible wheezing CV:  Pulses intact.  Regular rhythm.  No major extremity edema MS: Normal AROM mjr joints.  No obvious deformity  Abdomen: Soft.  Mildy distended.  Tenderness at  suprapubic/LLQ - mild .  No evidence of peritonitis.  No incarcerated hernias.  Ext:  No deformity.  No mjr edema.  No cyanosis Skin: No petechiae / purpurea.  No major sores.  Warm and dry    Results:   Cultures: Recent Results (from the past 720 hour(s))  SARS Coronavirus 2 by RT PCR (hospital order, performed in [facility] hospital lab) *cepheid single result test* Anterior Nasal Swab     Status: None   Collection Time: 07/06/23 12:36 PM   Specimen: Anterior Nasal Swab  Result Value Ref Range Status   SARS Coronavirus 2 by RT PCR NEGATIVE NEGATIVE Final    Comment: (NOTE) SARS-CoV-2 target nucleic acids are NOT DETECTED.  The SARS-CoV-2 RNA is generally detectable in upper and lower respiratory specimens during the acute phase of infection. The lowest concentration of SARS-CoV-2 viral copies this assay can detect is 250 copies / mL. A negative result does not preclude SARS-CoV-2 infection and should not be used as the sole basis for treatment or other patient management decisions.  A negative result may occur with improper specimen collection / handling, submission of specimen other than nasopharyngeal swab, presence of viral mutation(s) within the areas targeted by this assay, and inadequate number of viral copies (<250 copies / mL). A negative result must be combined with clinical observations, patient history, and epidemiological information.  Fact Sheet for Patients:   RoadLapTop.co.za  Fact Sheet for Healthcare Providers: http://kim-miller.com/  This test is not yet approved or  cleared by the [place] FDA and has been authorized for detection and/or diagnosis of SARS-CoV-2 by FDA under an Emergency Use Authorization (EUA).  This EUA will remain in effect (meaning this test can be used) for the duration of the COVID-19 declaration under Section 564(b)(1) of the Act, 21 U.S.C. section 360bbb-3(b)(1), unless the  authorization is terminated or revoked sooner.  Performed at [facility], [address] [place], [place] [address]     Labs: Results for orders placed or performed during the hospital encounter of 07/06/23 (from the past 48 hour(s))  Urinalysis, Routine w reflex microscopic -Urine, Clean Catch     Status: None   Collection Time: 07/06/23 11:04 AM  Result Value Ref Range   Color, Urine YELLOW YELLOW   APPearance CLEAR CLEAR   Specific Gravity, Urine >=1.030 1.005 - 1.030   pH 6.0 5.0 - 8.0   Glucose, UA NEGATIVE NEGATIVE mg/dL   Hgb urine dipstick NEGATIVE NEGATIVE   Bilirubin Urine NEGATIVE NEGATIVE   Ketones, ur NEGATIVE NEGATIVE mg/dL   Protein, ur NEGATIVE NEGATIVE mg/dL   Nitrite NEGATIVE NEGATIVE   Leukocytes,Ua NEGATIVE NEGATIVE    Comment: Microscopic not done on urines with negative protein, blood, leukocytes, nitrite, or glucose < 500 mg/dL. Performed at [facility], [address] [place], [place] [address]   Lipase, blood     Status: None   Collection Time: 07/06/23 12:36 PM  Result Value Ref  Range   Lipase 30 11 - 51 U/L    Comment: Performed at [facility], [address] [place], [place] [address]  Comprehensive metabolic panel     Status: Abnormal   Collection Time: 07/06/23 12:36 PM  Result Value Ref Range   Sodium 135 135 - 145 mmol/L   Potassium 3.5 3.5 - 5.1 mmol/L   Chloride 104 98 - 111 mmol/L   CO2 24 22 - 32 mmol/L   Glucose, Bld 106 (H) 70 - 99 mg/dL    Comment: Glucose reference range applies only to samples taken after fasting for at least 8 hours.   BUN 15 6 - 20 mg/dL   Creatinine, Ser 0.98 0.61 - 1.24 mg/dL   Calcium 8.5 (L) 8.9 - 10.3 mg/dL   Total Protein 7.0 6.5 - 8.1 g/dL   Albumin 3.2 (L) 3.5 - 5.0 g/dL   AST 18 15 - 41 U/L   ALT 23 0 - 44 U/L   Alkaline Phosphatase 52 38 - 126 U/L   Total Bilirubin 0.4 0.3 - 1.2 mg/dL   GFR, Estimated >11 >91 mL/min    Comment: (NOTE) Calculated using the  CKD-EPI Creatinine Equation (2021)    Anion gap 7 5 - 15    Comment: Performed at [facility], [address] [place], [place] [address]  CBC     Status: Abnormal   Collection Time: 07/06/23 12:36 PM  Result Value Ref Range   WBC 8.1 4.0 - 10.5 K/uL   RBC 3.89 (L) 4.22 - 5.81 MIL/uL   Hemoglobin 10.2 (L) 13.0 - 17.0 g/dL   HCT 56.2 (L) 13.0 - 86.5 %   MCV 80.2 80.0 - 100.0 fL   MCH 26.2 26.0 - 34.0 pg   MCHC 32.7 30.0 - 36.0 g/dL   RDW 78.4 69.6 - 29.5 %   Platelets 297 150 - 400 K/uL   nRBC 0.0 0.0 - 0.2 %    Comment: Performed at [facility], [address] [place], [place] [address]  SARS Coronavirus 2 by RT PCR (hospital order, performed in [facility] hospital lab) *cepheid single result test* Anterior Nasal Swab     Status: None   Collection Time: 07/06/23 12:36 PM   Specimen: Anterior Nasal Swab  Result Value Ref Range   SARS Coronavirus 2 by RT PCR NEGATIVE NEGATIVE    Comment: (NOTE) SARS-CoV-2 target nucleic acids are NOT DETECTED.  The SARS-CoV-2 RNA is generally detectable in upper and lower respiratory specimens during the acute phase of infection. The lowest concentration of SARS-CoV-2 viral copies this assay can detect is 250 copies / mL. A negative result does not preclude SARS-CoV-2 infection and should not be used as the sole basis for treatment or other patient management decisions.  A negative result may occur with improper specimen collection / handling, submission of specimen other than nasopharyngeal swab, presence of viral mutation(s) within the areas targeted by this assay, and inadequate number of viral copies (<250 copies / mL). A negative result must be combined with clinical observations, patient history, and epidemiological information.  Fact Sheet for Patients:   RoadLapTop.co.za  Fact Sheet for Healthcare Providers: http://kim-miller.com/  This test is not yet approved or   cleared by the [place] FDA and has been authorized for detection and/or diagnosis of SARS-CoV-2 by FDA under an Emergency Use Authorization (EUA).  This EUA will remain in effect (meaning this test can be used) for  the duration of the COVID-19 declaration under Section 564(b)(1) of the Act, 21 U.S.C. section 360bbb-3(b)(1), unless the authorization is terminated or revoked sooner.  Performed at [facility], [address] [place], [place] [address]   HIV Antibody (routine testing w rflx)     Status: None   Collection Time: 07/06/23  8:15 PM  Result Value Ref Range   HIV Screen 4th Generation wRfx Non Reactive Non Reactive    Comment: Performed at [facility], [address] [place], [place] [address]  CBC     Status: Abnormal   Collection Time: 07/06/23  8:15 PM  Result Value Ref Range   WBC 8.8 4.0 - 10.5 K/uL   RBC 3.72 (L) 4.22 - 5.81 MIL/uL   Hemoglobin 9.8 (L) 13.0 - 17.0 g/dL   HCT 32.4 (L) 40.1 - 02.7 %   MCV 83.9 80.0 - 100.0 fL   MCH 26.3 26.0 - 34.0 pg   MCHC 31.4 30.0 - 36.0 g/dL   RDW 25.3 66.4 - 40.3 %   Platelets 297 150 - 400 K/uL   nRBC 0.0 0.0 - 0.2 %    Comment: Performed at [facility], [address] [place], [place] [address]  Creatinine, serum     Status: None   Collection Time: 07/06/23  8:15 PM  Result Value Ref Range   Creatinine, Ser 0.88 0.61 - 1.24 mg/dL   GFR, Estimated >95 >63 mL/min    Comment: (NOTE) Calculated using the CKD-EPI Creatinine Equation (2021) Performed at [facility], [address], [place], [place] [address]   Basic metabolic panel     Status: Abnormal   Collection Time: 07/07/23  4:22 AM  Result Value Ref Range   Sodium 137 135 - 145 mmol/L   Potassium 3.6 3.5 - 5.1 mmol/L   Chloride 107 98 - 111 mmol/L   CO2 23 22 - 32 mmol/L   Glucose, Bld 96 70 - 99 mg/dL    Comment: Glucose reference range applies only to samples taken after fasting for at least 8  hours.   BUN 12 6 - 20 mg/dL   Creatinine, Ser 3.32 0.61 - 1.24 mg/dL   Calcium 8.2 (L) 8.9 - 10.3 mg/dL   GFR, Estimated >95 >18 mL/min    Comment: (NOTE) Calculated using the CKD-EPI Creatinine Equation (2021)    Anion gap 7 5 - 15    Comment: Performed at [facility], [address] [place], [place] [address]  CBC with Differential/Platelet     Status: Abnormal   Collection Time: 07/07/23  4:22 AM  Result Value Ref Range   WBC 8.3 4.0 - 10.5 K/uL   RBC 3.65 (L) 4.22 - 5.81 MIL/uL   Hemoglobin 9.5 (L) 13.0 - 17.0 g/dL   HCT 06.3 (L) 01.6 - 01.0 %   MCV 83.8 80.0 - 100.0 fL   MCH 26.0 26.0 - 34.0 pg   MCHC 31.0 30.0 - 36.0 g/dL   RDW 93.2 35.5 - 73.2 %   Platelets 278 150 - 400 K/uL   nRBC 0.0 0.0 - 0.2 %   Neutrophils Relative % 73 %   Neutro Abs 6.0 1.7 - 7.7 K/uL   Lymphocytes Relative 15 %   Lymphs Abs 1.2 0.7 - 4.0 K/uL   Monocytes Relative 9 %   Monocytes Absolute 0.8 0.1 - 1.0 K/uL   Eosinophils Relative 2 %   Eosinophils Absolute 0.2 0.0 - 0.5  K/uL   Basophils Relative 1 %   Basophils Absolute 0.1 0.0 - 0.1 K/uL   Immature Granulocytes 0 %   Abs Immature Granulocytes 0.03 0.00 - 0.07 K/uL    Comment: Performed at [facility], [address], [place], [place] [address]  Protime-INR     Status: None   Collection Time: 07/07/23  4:22 AM  Result Value Ref Range   Prothrombin Time 15.1 11.4 - 15.2 seconds   INR 1.2 0.8 - 1.2    Comment: (NOTE) INR goal varies based on device and disease states. Performed at [facility], [address] [place], [place] [address]     Imaging / Studies: CT ABDOMEN PELVIS W CONTRAST  Result Date: 07/06/2023 CLINICAL DATA:  Acute lower abdominal pain. EXAM: CT ABDOMEN AND PELVIS WITH CONTRAST TECHNIQUE: Multidetector CT imaging of the abdomen and pelvis was performed using the standard protocol following bolus administration of intravenous contrast. RADIATION DOSE  REDUCTION: This exam was performed according to the departmental dose-optimization program which includes automated exposure control, adjustment of the mA and/or kV according to patient size and/or use of iterative reconstruction technique. CONTRAST:  OMNIPAQUE IOHEXOL 300 MG/ML  SOLN COMPARISON:  Noncontrast CT on 06/15/2010 FINDINGS: Lower Chest: No acute findings. Hepatobiliary: No suspicious hepatic masses identified. Gallbladder is unremarkable. No evidence of biliary ductal dilatation. Pancreas:  No mass or inflammatory changes. Spleen: Within normal limits in size and appearance. Adrenals/Urinary Tract: No suspicious masses identified. No evidence of ureteral calculi or hydronephrosis. Stomach/Bowel: Normal appendix visualized. Moderate to severe wall thickening is seen involving the sigmoid colon with surrounding pericolonic inflammatory changes. No definite diverticular disease identified by CT. Extraluminal gas and fluid collection is seen within the central sigmoid mesocolon, measuring 3.3 x 3.2 cm. This is consistent with sigmoid diverticulitis or colitis with pericolonic abscess. No evidence of free intraperitoneal air. Vascular/Lymphatic: No pathologically enlarged lymph nodes. No acute vascular findings. Reproductive:  No mass or other significant abnormality. Other:  None. Musculoskeletal:  No suspicious bone lesions identified. IMPRESSION: Perforated sigmoid colitis or diverticulitis, with 3 cm pericolonic abscess in sigmoid mesocolon. Electronically Signed   By: Danae Orleans M.D.   On: 07/06/2023 13:58    Medications / Allergies: per chart  Antibiotics: Anti-infectives (From admission, onward)    Start     Dose/Rate Route Frequency Ordered Stop   07/06/23 2200  piperacillin-tazobactam (ZOSYN) IVPB 3.375 g        3.375 g 12.5 mL/hr over 240 Minutes Intravenous Every 8 hours 07/06/23 1851     07/06/23 1530  piperacillin-tazobactam (ZOSYN) IVPB 3.375 g        3.375 g 100 mL/hr over  30 Minutes Intravenous  Once 07/06/23 1521 07/06/23 1555         Note: Portions of this report may have been transcribed using voice recognition software. Every effort was made to ensure accuracy; however, inadvertent computerized transcription errors may be present.   Any transcriptional errors that result from this process are unintentional.    Ardeth Sportsman, MD, FACS, MASCRS Esophageal, Gastrointestinal & Colorectal Surgery Robotic and Minimally Invasive Surgery  [facility] [address], [address] [place], [place] [address] [phone] Fax [phone] Main  CONTACT INFORMATION:  Weekday (9AM-5PM): Call [facility] main office at [phone]  Weeknight (5PM-9AM) or Weekend/Holiday: Check [email or web address] (password " [email or web address]") for General Surgery [facility] coverage  (Please, do not use SecureChat as it is not reliable  communication to reach operating surgeons for immediate patient care given surgeries/outpatient duties/clinic/cross-coverage/off post-call which would lead to a delay in care.  Epic staff messaging available for outptient concerns, but may not be answered for 48 hours or more).     07/08/2023  8:59 AM

## 2023-07-08 NOTE — Progress Notes (Signed)
[facility] PROGRESS NOTE    Progress Note  Cory Gomez  WUJ:[id number] DOB: 1981/06/22 DOA: 07/06/2023 PCP: [name], MD     Brief Narrative:   Cory Gomez is an 42 y.o. male past medical history significant for anxiety and depression comes into the ER complaining of abdominal pain for approximately 1 week.  She was able to go to work on Sunday admission when he came into the ED because he was having nausea and fever   Assessment/Plan:   Acute diverticulitis  sigmoid colon with perio colonic abscess (HCC) Clinically improving on zosyn. Not in a location amenable to IR drainage. Gen surg following, advancing diet today, may be stable for d/c tomorrow. Is aware will need outpt colonoscopy. Gen surg advising outpt f/u as they will consider partial colectomy given young age at presentation  Anxiety and depression: Continue Wellbutrin and duloxetine.  Vertigo: Continue meclizine as needed.   DVT prophylaxis: lovenox Family Communication: wife updated @ bedside Status is: Inpatient Remains inpatient appropriate because: advancing diet, continuing IV abx    Code Status:     Code Status Orders  (From admission, onward)           Start     Ordered   07/06/23 1920  Full code  Continuous       Question:  By:  Answer:  Consent: discussion documented in EHR   07/06/23 1920           Code Status History     Date Active Date Inactive Code Status Order ID Comments User Context   12/30/2021 1756 01/01/2022 2155 Full Code [id number]  Rodolph Bong, MD ED         IV Access:   Peripheral IV   Procedures and diagnostic studies:   CT ABDOMEN PELVIS W CONTRAST  Result Date: 07/06/2023 CLINICAL DATA:  Acute lower abdominal pain. EXAM: CT ABDOMEN AND PELVIS WITH CONTRAST TECHNIQUE: Multidetector CT imaging of the abdomen and pelvis was performed using the standard protocol following bolus administration of intravenous contrast. RADIATION  DOSE REDUCTION: This exam was performed according to the departmental dose-optimization program which includes automated exposure control, adjustment of the mA and/or kV according to patient size and/or use of iterative reconstruction technique. CONTRAST:  OMNIPAQUE IOHEXOL 300 MG/ML  SOLN COMPARISON:  Noncontrast CT on 06/15/2010 FINDINGS: Lower Chest: No acute findings. Hepatobiliary: No suspicious hepatic masses identified. Gallbladder is unremarkable. No evidence of biliary ductal dilatation. Pancreas:  No mass or inflammatory changes. Spleen: Within normal limits in size and appearance. Adrenals/Urinary Tract: No suspicious masses identified. No evidence of ureteral calculi or hydronephrosis. Stomach/Bowel: Normal appendix visualized. Moderate to severe wall thickening is seen involving the sigmoid colon with surrounding pericolonic inflammatory changes. No definite diverticular disease identified by CT. Extraluminal gas and fluid collection is seen within the central sigmoid mesocolon, measuring 3.3 x 3.2 cm. This is consistent with sigmoid diverticulitis or colitis with pericolonic abscess. No evidence of free intraperitoneal air. Vascular/Lymphatic: No pathologically enlarged lymph nodes. No acute vascular findings. Reproductive:  No mass or other significant abnormality. Other:  None. Musculoskeletal:  No suspicious bone lesions identified. IMPRESSION: Perforated sigmoid colitis or diverticulitis, with 3 cm pericolonic abscess in sigmoid mesocolon. Electronically Signed   By: Danae Orleans M.D.   On: 07/06/2023 13:58     Medical Consultants:   None.   Subjective:    Cory Gomez pain is much improved 5/10, has some appetite, no bm, no  vomiting.  Objective:    Vitals:   07/07/23 0506 07/07/23 1315 07/07/23 1941 07/08/23 0457  BP: 134/76 137/87 (!) 128/92 130/83  Pulse: 69 81 87 80  Resp: 18 18 16 19   Temp: 98.4 F (36.9 C) 98 F (36.7 C) 98.4 F (36.9 C) 97.8 F (36.6 C)   TempSrc: Oral Oral Oral Oral  SpO2: 98% 100% 99% 98%  Weight:      Height:       SpO2: 98 %   Intake/Output Summary (Last 24 hours) at 07/08/2023 1226 Last data filed at 07/08/2023 1000 Gross per 24 hour  Intake 3085.66 ml  Output 350 ml  Net 2735.66 ml   Filed Weights   07/06/23 1103 07/06/23 1112  Weight: 131.1 kg 131.1 kg    Exam: General exam: In no acute distress. Respiratory system: Good air movement and clear to auscultation. Cardiovascular system: S1 & S2 heard, RRR. No JVD. Gastrointestinal system: Abdomen is nondistended, soft and nontender.  Extremities: No pedal edema. Skin: No rashes, lesions or ulcers Psychiatry: Judgement and insight appear normal. Mood & affect appropriate.    Data Reviewed:    Labs: Basic Metabolic Panel: Recent Labs  Lab 07/06/23 1236 07/06/23 2015 07/07/23 0422  NA 135  --  137  K 3.5  --  3.6  CL 104  --  107  CO2 24  --  23  GLUCOSE 106*  --  96  BUN 15  --  12  CREATININE 0.78 0.88 0.82  CALCIUM 8.5*  --  8.2*   GFR Estimated Creatinine Clearance: 174.1 mL/min (by C-G formula based on SCr of 0.82 mg/dL). Liver Function Tests: Recent Labs  Lab 07/06/23 1236  AST 18  ALT 23  ALKPHOS 52  BILITOT 0.4  PROT 7.0  ALBUMIN 3.2*   Recent Labs  Lab 07/06/23 1236  LIPASE 30   No results for input(s): "AMMONIA" in the last 168 hours. Coagulation profile Recent Labs  Lab 07/07/23 0422  INR 1.2   COVID-19 Labs  No results for input(s): "DDIMER", "FERRITIN", "LDH", "CRP" in the last 72 hours.  Lab Results  Component Value Date   SARSCOV2NAA NEGATIVE 07/06/2023   SARSCOV2NAA NEGATIVE 11/25/2022   SARSCOV2NAA NEGATIVE 12/29/2021   SARSCOV2NAA NEGATIVE 12/25/2021    CBC: Recent Labs  Lab 07/06/23 1236 07/06/23 2015 07/07/23 0422  WBC 8.1 8.8 8.3  NEUTROABS  --   --  6.0  HGB 10.2* 9.8* 9.5*  HCT 31.2* 31.2* 30.6*  MCV 80.2 83.9 83.8  PLT 297 297 278   Cardiac Enzymes: No results for input(s):  "CKTOTAL", "CKMB", "CKMBINDEX", "TROPONINI" in the last 168 hours. BNP (last 3 results) No results for input(s): "PROBNP" in the last 8760 hours. CBG: No results for input(s): "GLUCAP" in the last 168 hours. D-Dimer: No results for input(s): "DDIMER" in the last 72 hours. Hgb A1c: No results for input(s): "HGBA1C" in the last 72 hours. Lipid Profile: No results for input(s): "CHOL", "HDL", "LDLCALC", "TRIG", "CHOLHDL", "LDLDIRECT" in the last 72 hours. Thyroid function studies: No results for input(s): "TSH", "T4TOTAL", "T3FREE", "THYROIDAB" in the last 72 hours.  Invalid input(s): "FREET3" Anemia work up: No results for input(s): "VITAMINB12", "FOLATE", "FERRITIN", "TIBC", "IRON", "RETICCTPCT" in the last 72 hours. Sepsis Labs: Recent Labs  Lab 07/06/23 1236 07/06/23 2015 07/07/23 0422  WBC 8.1 8.8 8.3   Microbiology Recent Results (from the past 240 hour(s))  SARS Coronavirus 2 by RT PCR (hospital order, performed in [facility] hospital lab) *cepheid  single result test* Anterior Nasal Swab     Status: None   Collection Time: 07/06/23 12:36 PM   Specimen: Anterior Nasal Swab  Result Value Ref Range Status   SARS Coronavirus 2 by RT PCR NEGATIVE NEGATIVE Final    Comment: (NOTE) SARS-CoV-2 target nucleic acids are NOT DETECTED.  The SARS-CoV-2 RNA is generally detectable in upper and lower respiratory specimens during the acute phase of infection. The lowest concentration of SARS-CoV-2 viral copies this assay can detect is 250 copies / mL. A negative result does not preclude SARS-CoV-2 infection and should not be used as the sole basis for treatment or other patient management decisions.  A negative result may occur with improper specimen collection / handling, submission of specimen other than nasopharyngeal swab, presence of viral mutation(s) within the areas targeted by this assay, and inadequate number of viral copies (<250 copies / mL). A negative result must be  combined with clinical observations, patient history, and epidemiological information.  Fact Sheet for Patients:   RoadLapTop.co.za  Fact Sheet for Healthcare Providers: http://kim-miller.com/  This test is not yet approved or  cleared by the [place] FDA and has been authorized for detection and/or diagnosis of SARS-CoV-2 by FDA under an Emergency Use Authorization (EUA).  This EUA will remain in effect (meaning this test can be used) for the duration of the COVID-19 declaration under Section 564(b)(1) of the Act, 21 U.S.C. section 360bbb-3(b)(1), unless the authorization is terminated or revoked sooner.  Performed at [facility], [address] [place], [place] [address]      Medications:    buPROPion  150 mg Oral Daily   DULoxetine  90 mg Oral Daily   enoxaparin (LOVENOX) injection  60 mg Subcutaneous QHS   zolpidem  5 mg Oral QHS   Continuous Infusions:  sodium chloride 10 mL (07/07/23 1321)   0.9 % NaCl with KCl 20 mEq / L 125 mL/hr at 07/08/23 0436   lactated ringers     methocarbamol (ROBAXIN) IV     piperacillin-tazobactam (ZOSYN)  IV 3.375 g (07/08/23 0436)      LOS: 2 days   Silvano Bilis  [facility]  07/08/2023, 12:26 PM

## 2023-07-09 DIAGNOSIS — K631 Perforation of intestine (nontraumatic): Secondary | ICD-10-CM | POA: Diagnosis not present

## 2023-07-09 LAB — BASIC METABOLIC PANEL
Anion gap: 8 (ref 5–15)
BUN: 7 mg/dL (ref 6–20)
CO2: 25 mmol/L (ref 22–32)
Calcium: 8.4 mg/dL — ABNORMAL LOW (ref 8.9–10.3)
Chloride: 100 mmol/L (ref 98–111)
Creatinine, Ser: 0.86 mg/dL (ref 0.61–1.24)
GFR, Estimated: >60 mL/min (ref >60)
Glucose, Bld: 99 mg/dL (ref 70–99)
Potassium: 3.6 mmol/L (ref 3.5–5.1)
Sodium: 133 mmol/L — ABNORMAL LOW (ref 135–145)

## 2023-07-09 LAB — CBC
HCT: 30.9 % — ABNORMAL LOW (ref 39.0–52.0)
Hemoglobin: 9.7 g/dL — ABNORMAL LOW (ref 13.0–17.0)
MCH: 26.1 pg (ref 26.0–34.0)
MCHC: 31.4 g/dL (ref 30.0–36.0)
MCV: 83.3 fL (ref 80.0–100.0)
Platelets: 341 10*3/uL (ref 150–400)
RBC: 3.71 MIL/uL — ABNORMAL LOW (ref 4.22–5.81)
RDW: 12.9 % (ref 11.5–15.5)
WBC: 9.4 10*3/uL (ref 4.0–10.5)
nRBC: 0 % (ref 0.0–0.2)

## 2023-07-09 MED ORDER — AMOXICILLIN-POT CLAVULANATE 875-125 MG PO TABS: 1.0000 | ORAL_TABLET | Freq: Two times a day (BID) | ORAL | 1 refills | Status: AC

## 2023-07-09 MED ORDER — ACETAMINOPHEN 500 MG PO TABS
1000.0000 mg | ORAL_TABLET | Freq: Four times a day (QID) | ORAL | Status: DC
  Administered 2023-07-09 – 2023-07-14 (×15): 1000 mg via ORAL
  Filled 2023-07-09 (×15): qty 2

## 2023-07-09 NOTE — Plan of Care (Signed)

## 2023-07-09 NOTE — Progress Notes (Signed)
07/09/2023  Cory Gomez [id number] Jul 03, 1981  CARE TEAM: PCP: [name], MD  Outpatient Care Team: Patient Care Team: [name], MD as PCP - General (Family Medicine)  Inpatient Treatment Team: Treatment Team:  Rhetta Mura, MD [facility], Md, MD Lilyan Gilford, MD Kelby Aline, RN Lucia Gaskins, [place] Riki Sheer, RN Helyn Numbers, RN   Problem List:   Principal Problem:   Perforated sigmoid colon [facility]) Active Problems:   Prediabetes   ADD (attention deficit disorder)   GAD (generalized anxiety disorder)   Hypertension   Obesity, Class II, BMI 35-39.9   * No surgery found *      Assessment [facility] Stay = 3 days)      First episode of complex diverticulitis with abscess - gradually improving    Plan:  Continue IV antibiotics.  Agree with piperacillin/tazobactam given complex attack.  Advance diet.    If continues to improve, hopefully discharge tomorrow with transition to oral antibiotics with close outpatient follow-up.  Pathophysiology of diverticulitis with its natural history was discussed.  Given his young age with a complicated attack with abscess, I suspect his risk of recurrence and other issues is rather high and he could benefit from segmental sigmoid colectomy at some point.  Hopefully we can get this infection under control with close outpatient follow-up to consider colonoscopy and then robotic sigmoid colectomy in 6-8 weeks.  If he worsens or does not improve by hospital day #5, repeat CAT scan to see if he has progression in his abscess.  If the abscess is worse or not amenable drainage may need operative washout and possible Hartmann resection.  We will see.  He seems to be improving and ileus seems to be resolving so hopefully we can avoid urgent Hartmann resection at this time.  -VTE prophylaxis- SCDs, etc  -mobilize as tolerated to help recovery  -Disposition: To be determined.  Possibly 7/17 with oral  antibiotics.       I reviewed nursing notes, hospitalist notes, last 24 h vitals and pain scores, last 48 h intake and output, last 24 h labs and trends, and last 24 h imaging results.  I have reviewed this patient's available data, including medical history, events of note, test results, etc as part of my evaluation.   A significant portion of that time was spent in counseling. Care during the described time interval was provided by me.  This care required moderate level of medical decision making.  07/09/2023    Subjective: (Chief complaint)  Pain less.  Trying thicker liquids.  Walking better.  Having flatus and some bowel movements.  No more nausea.  Objective:  Vital signs:  Vitals:   07/08/23 0457 07/08/23 1340 07/08/23 2116 07/09/23 0557  BP: 130/83 (!) 155/94 (!) 150/99 138/89  Pulse: 80 89 90 83  Resp: 19 18 17 18   Temp: 97.8 F (36.6 C) 98.4 F (36.9 C) 99.7 F (37.6 C) 98.3 F (36.8 C)  TempSrc: Oral Oral Oral Oral  SpO2: 98% 98% 98% 98%  Weight:      Height:        Last BM Date : 07/06/23  Intake/Output   Yesterday:  07/15 0701 - 07/16 0700 In: 3031.2 [P.O.:1200; I.V.:1731.2; IV Piggyback:100] Out: 2825 [Urine:2825] This shift:  No intake/output data recorded.  Bowel function:  Flatus: YES  BM:  YES  Drain: (No drain)   Physical Exam:  General: Pt awake/alert in no acute distress Eyes: PERRL, normal EOM.  Sclera clear.  No icterus Neuro: CN II-XII intact w/o focal sensory/motor deficits. Lymph: No head/neck/groin lymphadenopathy Psych:  No delerium/psychosis/paranoia.  Oriented x 4 HENT: Normocephalic, Mucus membranes moist.  No thrush Neck: Supple, No tracheal deviation.  No obvious thyromegaly Chest: No pain to chest wall compression.  Good respiratory excursion.  No audible wheezing CV:  Pulses intact.  Regular rhythm.  No major extremity edema MS: Normal AROM mjr joints.  No obvious deformity  Abdomen: Soft.  Mildy  distended.  Tenderness at suprapubic/LLQ - mild - improved .  No evidence of peritonitis.  No incarcerated hernias.  Ext:  No deformity.  No mjr edema.  No cyanosis Skin: No petechiae / purpurea.  No major sores.  Warm and dry    Results:   Cultures: Recent Results (from the past 720 hour(s))  SARS Coronavirus 2 by RT PCR (hospital order, performed in [facility] hospital lab) *cepheid single result test* Anterior Nasal Swab     Status: None   Collection Time: 07/06/23 12:36 PM   Specimen: Anterior Nasal Swab  Result Value Ref Range Status   SARS Coronavirus 2 by RT PCR NEGATIVE NEGATIVE Final    Comment: (NOTE) SARS-CoV-2 target nucleic acids are NOT DETECTED.  The SARS-CoV-2 RNA is generally detectable in upper and lower respiratory specimens during the acute phase of infection. The lowest concentration of SARS-CoV-2 viral copies this assay can detect is 250 copies / mL. A negative result does not preclude SARS-CoV-2 infection and should not be used as the sole basis for treatment or other patient management decisions.  A negative result may occur with improper specimen collection / handling, submission of specimen other than nasopharyngeal swab, presence of viral mutation(s) within the areas targeted by this assay, and inadequate number of viral copies (<250 copies / mL). A negative result must be combined with clinical observations, patient history, and epidemiological information.  Fact Sheet for Patients:   RoadLapTop.co.za  Fact Sheet for Healthcare Providers: http://kim-miller.com/  This test is not yet approved or  cleared by the [place] FDA and has been authorized for detection and/or diagnosis of SARS-CoV-2 by FDA under an Emergency Use Authorization (EUA).  This EUA will remain in effect (meaning this test can be used) for the duration of the COVID-19 declaration under Section 564(b)(1) of the Act, 21  U.S.C. section 360bbb-3(b)(1), unless the authorization is terminated or revoked sooner.  Performed at [facility], [address] [place], [place] [address]     Labs: Results for orders placed or performed during the hospital encounter of 07/06/23 (from the past 48 hour(s))  CBC     Status: Abnormal   Collection Time: 07/09/23  4:17 AM  Result Value Ref Range   WBC 9.4 4.0 - 10.5 K/uL   RBC 3.71 (L) 4.22 - 5.81 MIL/uL   Hemoglobin 9.7 (L) 13.0 - 17.0 g/dL   HCT 69.6 (L) 29.5 - 28.4 %   MCV 83.3 80.0 - 100.0 fL   MCH 26.1 26.0 - 34.0 pg   MCHC 31.4 30.0 - 36.0 g/dL   RDW 13.2 44.0 - 10.2 %   Platelets 341 150 - 400 K/uL   nRBC 0.0 0.0 - 0.2 %    Comment: Performed at [facility], [address] [place], [place] [address]  Basic metabolic panel     Status: Abnormal   Collection Time: 07/09/23  4:17 AM  Result Value Ref Range   Sodium 133 (L) 135 -  145 mmol/L   Potassium 3.6 3.5 - 5.1 mmol/L   Chloride 100 98 - 111 mmol/L   CO2 25 22 - 32 mmol/L   Glucose, Bld 99 70 - 99 mg/dL    Comment: Glucose reference range applies only to samples taken after fasting for at least 8 hours.   BUN 7 6 - 20 mg/dL   Creatinine, Ser 1.61 0.61 - 1.24 mg/dL   Calcium 8.4 (L) 8.9 - 10.3 mg/dL   GFR, Estimated >09 >60 mL/min    Comment: (NOTE) Calculated using the CKD-EPI Creatinine Equation (2021)    Anion gap 8 5 - 15    Comment: Performed at [facility], [address] [place], [place] [address]    Imaging / Studies: No results found.  Medications / Allergies: per chart  Antibiotics: Anti-infectives (From admission, onward)    Start     Dose/Rate Route Frequency Ordered Stop   07/06/23 2200  piperacillin-tazobactam (ZOSYN) IVPB 3.375 g        3.375 g 12.5 mL/hr over 240 Minutes Intravenous Every 8 hours 07/06/23 1851     07/06/23 1530  piperacillin-tazobactam (ZOSYN) IVPB 3.375 g        3.375 g 100 mL/hr over 30 Minutes  Intravenous  Once 07/06/23 1521 07/06/23 1555         Note: Portions of this report may have been transcribed using voice recognition software. Every effort was made to ensure accuracy; however, inadvertent computerized transcription errors may be present.   Any transcriptional errors that result from this process are unintentional.    Ardeth Sportsman, MD, FACS, MASCRS Esophageal, Gastrointestinal & Colorectal Surgery Robotic and Minimally Invasive Surgery  [facility] [address] [place], [place] [address] [phone] Fax [phone] Main  CONTACT INFORMATION:  Weekday (9AM-5PM): Call [facility] main office at [phone]  Weeknight (5PM-9AM) or Weekend/Holiday: Check [email or web address] (password " [email or web address]") for General Surgery [facility] coverage  (Please, do not use SecureChat as it is not reliable communication to reach operating surgeons for immediate patient care given surgeries/outpatient duties/clinic/cross-coverage/off post-call which would lead to a delay in care.  Epic staff messaging available for outptient concerns, but may not be answered for 48 hours or more).     07/09/2023  8:15 AM

## 2023-07-09 NOTE — Plan of Care (Signed)
  Problem: Education: Goal: Knowledge of General Education information will improve Description: Including pain rating scale, medication(s)/side effects and non-pharmacologic comfort measures Outcome: Progressing   Problem: Health Behavior/Discharge Planning: Goal: Ability to manage health-related needs will improve Outcome: Progressing   Problem: Clinical Measurements: Goal: Ability to maintain clinical measurements within normal limits will improve Outcome: Progressing Goal: Will remain free from infection Outcome: Progressing   Problem: Pain Managment: Goal: General experience of comfort will improve Outcome: Progressing   

## 2023-07-09 NOTE — Progress Notes (Signed)
[facility] PROGRESS NOTE    Progress Note  Cory Gomez  BMW:[id number] DOB: 06-23-1981 DOA: 07/06/2023 PCP: [name], MD     Brief Narrative:   42 y.o. male past medical history significant for anxiety and depression comes into the ER complaining of abdominal pain for approximately 1 week.   was able to go to work on Sunday admission when he came into the ED because he was having nausea and fever   Assessment/Plan:   Acute diverticulitis  sigmoid colon with perio colonic abscess (HCC) improving on zosyn. Not amenable to IR drainage. Gen surg following Is aware will need outpt colonoscopy. Gen surg advising outpt f/u as they will consider partial colectomy given young age at presentation  Anxiety and depression: Continue Wellbutrin and duloxetine.  Vertigo: Continue meclizine as needed.   DVT prophylaxis: lovenox Family Communication: wife updated @ bedside Status is: Inpatient Remains inpatient appropriate because: advancing diet, continuing IV abx    Code Status:     Code Status Orders  (From admission, onward)           Start     Ordered   07/06/23 1920  Full code  Continuous       Question:  By:  Answer:  Consent: discussion documented in EHR   07/06/23 1920           Code Status History     Date Active Date Inactive Code Status Order ID Comments User Context   12/30/2021 1756 01/01/2022 2155 Full Code [id number]  Rodolph Bong, MD ED         IV Access:   Peripheral IV   Procedures and diagnostic studies:   No results found.   Medical Consultants:   None.   Subjective:   Well 1/10 pain No n/v diarr Eating some  Objective:    Vitals:   07/08/23 1340 07/08/23 2116 07/09/23 0557 07/09/23 1338  BP: (!) 155/94 (!) 150/99 138/89 (!) 141/97  Pulse: 89 90 83 86  Resp: 18 17 18 18   Temp: 98.4 F (36.9 C) 99.7 F (37.6 C) 98.3 F (36.8 C) 98.2 F (36.8 C)  TempSrc: Oral Oral Oral Oral  SpO2: 98% 98% 98%  97%  Weight:      Height:       SpO2: 97 %   Intake/Output Summary (Last 24 hours) at 07/09/2023 1542 Last data filed at 07/09/2023 1500 Gross per 24 hour  Intake 3978.85 ml  Output 3325 ml  Net 653.85 ml   Filed Weights   07/06/23 1103 07/06/23 1112  Weight: 131.1 kg 131.1 kg    Exam: General exam: no ict pallor Respiratory system: Good air movement and clear to auscultation. Cardiovascular system: S1 & S2 heard, RRR. No JVD. Gastrointestinal system: Abdomen is soft nt nd Extremities: No pedal edema. Skin: No rashes, lesions or ulcers Psychiatry: Judgement and insight appear normal. Mood & affect appropriate.    Data Reviewed:    Labs: Basic Metabolic Panel: Recent Labs  Lab 07/06/23 1236 07/06/23 2015 07/07/23 0422 07/09/23 0417  NA 135  --  137 133*  K 3.5  --  3.6 3.6  CL 104  --  107 100  CO2 24  --  23 25  GLUCOSE 106*  --  96 99  BUN 15  --  12 7  CREATININE 0.78 0.88 0.82 0.86  CALCIUM 8.5*  --  8.2* 8.4*   GFR Estimated Creatinine Clearance: 166 mL/min (by C-G formula based on  SCr of 0.86 mg/dL). Liver Function Tests: Recent Labs  Lab 07/06/23 1236  AST 18  ALT 23  ALKPHOS 52  BILITOT 0.4  PROT 7.0  ALBUMIN 3.2*   Recent Labs  Lab 07/06/23 1236  LIPASE 30   No results for input(s): "AMMONIA" in the last 168 hours. Coagulation profile Recent Labs  Lab 07/07/23 0422  INR 1.2   COVID-19 Labs  No results for input(s): "DDIMER", "FERRITIN", "LDH", "CRP" in the last 72 hours.  Lab Results  Component Value Date   SARSCOV2NAA NEGATIVE 07/06/2023   SARSCOV2NAA NEGATIVE 11/25/2022   SARSCOV2NAA NEGATIVE 12/29/2021   SARSCOV2NAA NEGATIVE 12/25/2021    CBC: Recent Labs  Lab 07/06/23 1236 07/06/23 2015 07/07/23 0422 07/09/23 0417  WBC 8.1 8.8 8.3 9.4  NEUTROABS  --   --  6.0  --   HGB 10.2* 9.8* 9.5* 9.7*  HCT 31.2* 31.2* 30.6* 30.9*  MCV 80.2 83.9 83.8 83.3  PLT 297 297 278 341   Cardiac Enzymes: No results for input(s):  "CKTOTAL", "CKMB", "CKMBINDEX", "TROPONINI" in the last 168 hours. BNP (last 3 results) No results for input(s): "PROBNP" in the last 8760 hours. CBG: No results for input(s): "GLUCAP" in the last 168 hours. D-Dimer: No results for input(s): "DDIMER" in the last 72 hours. Hgb A1c: No results for input(s): "HGBA1C" in the last 72 hours. Lipid Profile: No results for input(s): "CHOL", "HDL", "LDLCALC", "TRIG", "CHOLHDL", "LDLDIRECT" in the last 72 hours. Thyroid function studies: No results for input(s): "TSH", "T4TOTAL", "T3FREE", "THYROIDAB" in the last 72 hours.  Invalid input(s): "FREET3" Anemia work up: No results for input(s): "VITAMINB12", "FOLATE", "FERRITIN", "TIBC", "IRON", "RETICCTPCT" in the last 72 hours. Sepsis Labs: Recent Labs  Lab 07/06/23 1236 07/06/23 2015 07/07/23 0422 07/09/23 0417  WBC 8.1 8.8 8.3 9.4   Microbiology Recent Results (from the past 240 hour(s))  SARS Coronavirus 2 by RT PCR (hospital order, performed in [facility] hospital lab) *cepheid single result test* Anterior Nasal Swab     Status: None   Collection Time: 07/06/23 12:36 PM   Specimen: Anterior Nasal Swab  Result Value Ref Range Status   SARS Coronavirus 2 by RT PCR NEGATIVE NEGATIVE Final    Comment: (NOTE) SARS-CoV-2 target nucleic acids are NOT DETECTED.  The SARS-CoV-2 RNA is generally detectable in upper and lower respiratory specimens during the acute phase of infection. The lowest concentration of SARS-CoV-2 viral copies this assay can detect is 250 copies / mL. A negative result does not preclude SARS-CoV-2 infection and should not be used as the sole basis for treatment or other patient management decisions.  A negative result may occur with improper specimen collection / handling, submission of specimen other than nasopharyngeal swab, presence of viral mutation(s) within the areas targeted by this assay, and inadequate number of viral copies (<250 copies / mL). A negative  result must be combined with clinical observations, patient history, and epidemiological information.  Fact Sheet for Patients:   RoadLapTop.co.za  Fact Sheet for Healthcare Providers: http://kim-miller.com/  This test is not yet approved or  cleared by the [place] FDA and has been authorized for detection and/or diagnosis of SARS-CoV-2 by FDA under an Emergency Use Authorization (EUA).  This EUA will remain in effect (meaning this test can be used) for the duration of the COVID-19 declaration under Section 564(b)(1) of the Act, 21 U.S.C. section 360bbb-3(b)(1), unless the authorization is terminated or revoked sooner.  Performed at [facility], [address]  [place], [place] [address]      Medications:    acetaminophen  1,000 mg Oral Q6H   buPROPion  150 mg Oral Daily   DULoxetine  90 mg Oral Daily   enoxaparin (LOVENOX) injection  60 mg Subcutaneous QHS   zolpidem  5 mg Oral QHS   Continuous Infusions:  sodium chloride 10 mL (07/07/23 1321)   0.9 % NaCl with KCl 20 mEq / L 50 mL/hr at 07/09/23 0903   methocarbamol (ROBAXIN) IV     piperacillin-tazobactam (ZOSYN)  IV 3.375 g (07/09/23 1324)      LOS: 3 days   Jai-Gurmukh Venissa Nappi  [facility]  07/09/2023, 3:42 PM

## 2023-07-09 NOTE — Discharge Instructions (Signed)
RECOVERING FROM A DIVERTICULITIS ATTACK   EAT START WITH PUREED OR SOFT FOODS Gradually transition to a high fiber diet with a fiber supplement over the next few days after discharge  WALK Walk an hour a day.  Control your pain to do that.    CONTROL PAIN Control pain so that you can walk, sleep, tolerate sneezing/coughing, go up/down stairs.  HAVE A BOWEL MOVEMENT DAILY Keep your bowels regular to avoid problems.  OK to try a laxative to override constipation.  OK to use an antidairrheal to slow down diarrhea.  Call if not better after 2 tries  CALL IF YOU HAVE PROBLEMS/CONCERNS Call if you are still struggling despite following these instructions. Call if you have concerns not answered by these instructions     After your attack, expect some issues over the next few weeks.    To help you through this temporary phase, we start you out on a pureed (blenderized) diet.  Your first meal in the hospital was thin liquids.  You should have been given a pureed diet by the time you left the hospital.  We ask patients to stay on a pureed diet for the first few days to avoid anything getting "stuck."  Don't be alarmed if your ability to swallow doesn't progress according to this plan.  Everyone is different and some diets can advance more or less quickly.     Some BASIC RULES to follow are: Maintain an upright position whenever eating or drinking. Take small bites - just a teaspoon size bite at a time. Eat slowly.  It may also help to eat only one food at a time. Consider nibbling through smaller, more frequent meals & avoid the urge to eat BIG meals Do not push through feelings of fullness, nausea, or bloatedness Do not mix solid foods and liquids in the same mouthful Try not to "wash foods down" with large gulps of liquids. Avoid carbonated (bubbly/fizzy) drinks.   Avoid foods that make you feel gassy or bloated.  Start with bland foods first.  Wait on trying greasy, fried, or spicy  meals until you are tolerating more bland solids well. Expect to be more gassy/flatulent/bloated initially.  Walking will help your body manage it better. Consider using medications for bloating that contain simethicone such as  Maalox or Gas-X  Eat in a relaxed atmosphere & minimize distractions. Avoid talking while eating.   Do not use straws. Following each meal, sit in an upright position (90 degree angle) for 60 to 90 minutes.  Going for a short walk can help as well If food does stick, don't panic.  Try to relax and let the food pass on its own.  Sipping WARM LIQUID such as strong hot black tea can also help slide it down.   Be gradual in changes & use common sense:  -If you easily tolerating a certain "level" of foods, advance to the next level gradually -If you are having trouble swallowing a particular food, then avoid it.   -If food is sticking when you advance your diet, go back to thinner previous diet (the lower LEVEL) for 1-2 days.  LEVEL 1 = PUREED DIET  Do for the first FEW DAYS AFTER LEAVING THE HOSPITAL  -Foods in this group are pureed or blenderized to a smooth, mashed potato-like consistency.  -If necessary, the pureed foods can keep their shape with the addition of a thickening agent.   -Meat should be pureed to a smooth, pasty consistency.  Hot  broth or gravy may be added to the pureed meat, approximately 1 oz. of liquid per 3 oz. serving of meat. -CAUTION:  If any foods do not puree into a smooth consistency, swallowing will be more difficult.  (For example, nuts or seeds sometimes do not blend well.)  Hot Foods Cold Foods  Pureed scrambled eggs and cheese Pureed cottage cheese  Baby cereals Thickened juices and nectars  Thinned cooked cereals (no lumps) Thickened milk or eggnog  Pureed [place] toast or pancakes Ensure  Mashed potatoes Ice cream  Pureed parsley, au gratin, scalloped potatoes, candied sweet potatoes Fruit or [place] ice, sherbet  Pureed buttered  or alfredo noodles Plain yogurt  Pureed vegetables (no corn or peas) Instant breakfast  Pureed soups and creamed soups Smooth pudding, mousse, custard  Pureed scalloped apples Whipped gelatin  Gravies Sugar, syrup, honey, jelly  Sauces, cheese, tomato, barbecue, white, creamed Cream  Any baby food Creamer  Alcohol in moderation (not beer or champagne) Margarine  Coffee or tea Mayonnaise   Ketchup, mustard   Apple sauce   SAMPLE MENU:  PUREED DIET Breakfast Lunch Dinner  Orange juice, 1/2 cup Cream of wheat, 1/2 cup Pineapple juice, 1/2 cup Pureed [place], barley soup, 3/4 cup Pureed Hawaiian chicken, 3 oz  Scrambled eggs, mashed or blended with cheese, 1/2 cup Tea or coffee, 1 cup  Whole milk, 1 cup  Non-dairy creamer, 2 Tbsp. Mashed potatoes, 1/2 cup Pureed cooled broccoli, 1/2 cup Apple sauce, 1/2 cup Coffee or tea Mashed potatoes, 1/2 cup Pureed spinach, 1/2 cup Frozen yogurt, 1/2 cup Tea or coffee      LEVEL 2 = SOFT DIET  After your first few days, you can advance to a soft, low residue diet.   Keep on this diet until everything goes down easily.  Hot Foods Cold Foods  White fish Cottage cheese  Stuffed fish Junior baby fruit  Baby food meals Semi thickened juices  Minced soft cooked, scrambled, poached eggs nectars  Souffle & omelets Ripe mashed bananas  Cooked cereals Canned fruit, pineapple sauce, milk  potatoes Milkshake  Buttered or Alfredo noodles Custard  Cooked cooled vegetable Puddings, including tapioca  Sherbet Yogurt  Vegetable soup or alphabet soup Fruit ice, [place] ice  Gravies Whipped gelatin  Sugar, syrup, honey, jelly Junior baby desserts  Sauces:  Cheese, creamed, barbecue, tomato, white Cream  Coffee or tea Margarine   SAMPLE MENU:  LEVEL 2 Breakfast Lunch Dinner  Orange juice, 1/2 cup Oatmeal, 1/2 cup Scrambled eggs with cheese, 1/2 cup Decaffeinated tea, 1 cup Whole milk, 1 cup Non-dairy creamer, 2 Tbsp Pineapple juice, 1/2  cup Minced beef, 3 oz Gravy, 2 Tbsp Mashed potatoes, 1/2 cup Minced fresh broccoli, 1/2 cup Applesauce, 1/2 cup Coffee, 1 cup [place], barley soup, 3/4 cup Minced Hawaiian chicken, 3 oz Mashed potatoes, 1/2 cup Cooked spinach, 1/2 cup Frozen yogurt, 1/2 cup Non-dairy creamer, 2 Tbsp      LEVEL 3 = CHOPPED DIET  -After all the foods in level 2 (soft diet) are passing through well you should advance up to more chopped foods.  -It is still important to cut these foods into small pieces and eat slowly.  Hot Foods Cold Foods  Poultry Cottage cheese  Chopped Swedish meatballs Yogurt  Meat salads (ground or flaked meat) Milk  Flaked fish (tuna) Milkshakes  Poached or scrambled eggs Soft, cold, dry cereal  Souffles and omelets Fruit juices or nectars  Cooked cereals Chopped canned fruit  Chopped [place] toast  or pancakes Canned fruit cocktail  Noodles or pasta (no rice) Pudding, mousse, custard  Cooked vegetables (no frozen peas, corn, or mixed vegetables) Green salad  Canned small sweet peas Ice cream  Creamed soup or vegetable soup Fruit ice, [place] ice  Pureed vegetable soup or alphabet soup Non-dairy creamer  Ground scalloped apples Margarine  Gravies Mayonnaise  Sauces:  Cheese, creamed, barbecue, tomato, white Ketchup  Coffee or tea Mustard   SAMPLE MENU:  LEVEL 3 Breakfast Lunch Dinner  Orange juice, 1/2 cup Oatmeal, 1/2 cup Scrambled eggs with cheese, 1/2 cup Decaffeinated tea, 1 cup Whole milk, 1 cup Non-dairy creamer, 2 Tbsp Ketchup, 1 Tbsp Margarine, 1 tsp Salt, 1/4 tsp Sugar, 2 tsp Pineapple juice, 1/2 cup Ground beef, 3 oz Gravy, 2 Tbsp Mashed potatoes, 1/2 cup Cooked spinach, 1/2 cup Applesauce, 1/2 cup Decaffeinated coffee Whole milk Non-dairy creamer, 2 Tbsp Margarine, 1 tsp Salt, 1/4 tsp Pureed [place], barley soup, 3/4 cup Barbecue chicken, 3 oz Mashed potatoes, 1/2 cup Ground fresh broccoli, 1/2 cup Frozen yogurt, 1/2 cup Decaffeinated tea, 1  cup Non-dairy creamer, 2 Tbsp Margarine, 1 tsp Salt, 1/4 tsp Sugar, 1 tsp    LEVEL 4:  HIGH FIBER DIET / REGULAR FOODS  -Foods in this group are soft, moist, regularly textured foods.   -This level includes meat and breads, which tend to be the hardest things to swallow.   -Eat very slowly, chew well and continue to avoid carbonated drinks. -most people are at this level in 2-4 weeks  Hot Foods Cold Foods  Baked fish or skinned Soft cheeses - cottage cheese  Souffles and omelets Cream cheese  Eggs Yogurt  Stuffed shells Milk  Spaghetti with meat sauce Milkshakes  Cooked cereal Cold dry cereals (no nuts, dried fruit, coconut)  [place] toast or pancakes Crackers  Buttered toast Fruit juices or nectars  Noodles or pasta (no rice) Canned fruit  Potatoes (all types) Ripe bananas  Soft, cooked vegetables (no corn, lima, or baked beans) Peeled, ripe, fresh fruit  Creamed soups or vegetable soup Cakes (no nuts, dried fruit, coconut)  Canned chicken noodle soup Plain doughnuts  Gravies Ice cream  Bacon dressing Pudding, mousse, custard  Sauces:  Cheese, creamed, barbecue, tomato, white Fruit ice, [place] ice, sherbet  Decaffeinated tea or coffee Whipped gelatin  Pork chops Regular gelatin   Canned fruited gelatin molds   Sugar, syrup, honey, jam, jelly   Cream   Non-dairy   Margarine   Oil   Mayonnaise   Ketchup   Mustard   TROUBLESHOOTING IRREGULAR BOWELS  1) Avoid extremes of bowel movements (no bad constipation/diarrhea)  2) Miralax 17gm mixed in 8oz. water or juice-daily. May use BID as needed.  3) Gas-x,Phazyme, etc. as needed for gas & bloating.  4) Soft,bland diet. No spicy,greasy,fried foods.  5) Prilosec over-the-counter as needed  6) May hold gluten/wheat products from diet to see if symptoms improve.  7) May try probiotics (Align, Activa, etc) to help calm the bowels down  7) If symptoms become worse call back immediately.    If you have any questions please  call our office at [facility]: [phone].     This information is not intended to replace advice given to you by your health care provider. Make sure you discuss any questions you have with your health care provider.      Bowel Obstruction A bowel obstruction is a blockage in the small or large bowel. The bowel, which is also called  the intestine, is a long, slender tube that connects the stomach to the anus. When a person eats and drinks, food and fluids go from the mouth to the stomach to the small bowel. This is where most of the nutrients in the food and fluids are absorbed. After the small bowel, material passes through the large bowel for further absorption until any leftover material leaves the body as stool through the anus during a bowel movement. A bowel obstruction will prevent food and fluids from passing through the bowel as they normally do during digestion. The bowel can become partially or completely blocked. If this condition is not treated, it can be dangerous because the bowel could rupture. What are the causes? Common causes of this condition include: Scar tissue (adhesions) from previous surgery or treatment with high-energy X-rays (radiation). Recent surgery. This may cause the movements of the bowel to slow down and cause food to block the intestine. Inflammatory bowel disease, such as Crohn's disease or diverticulitis. Growths or tumors. A bulging organ (hernia). Twisting of the bowel (volvulus). A foreign body. Slipping of a part of the bowel into another part (intussusception). What are the signs or symptoms? Symptoms of this condition include: Pain in the abdomen. Depending on the degree of obstruction, pain may be: Mild or severe. Dull cramping or sharp pain. In one area or in the entire abdomen. Nausea and vomiting. Vomit may be greenish or a yellow bile color. Bloating in the abdomen. Difficulty passing stool (constipation). Lack of  passing gas. Frequent belching. Diarrhea. This may occur if the obstruction is partial and runny stool is able to leak around the obstruction. How is this diagnosed? This condition may be diagnosed based on: A physical exam. Medical history. Imaging tests of the abdomen or pelvis, such as X-Jaqualyn or CT scan. Blood or urine tests. How is this treated? Treatment for this condition depends on the cause and severity of the problem. Treatment may include: Fluids and pain medicines that are given through an IV. Your health care provider may instruct you not to eat or drink if you have nausea or vomiting. Eating a simple diet. You may be asked to consume a clear liquid diet for several days. This allows the bowel to rest. Placement of a small tube (nasogastric tube) into the stomach. This will relieve pain, discomfort, and nausea by removing blocked air and fluids from the stomach. It can also help the obstruction clear up faster. Surgery. This may be required if other treatments do not work. Surgery may be required for: Bowel obstruction from a hernia. This can be an emergency procedure. Scar tissue that causes frequent or severe obstructions. Follow these instructions at home: Medicines Take over-the-counter and prescription medicines only as told by your health care provider. If you were prescribed an antibiotic medicine, take it as told by your health care provider. Do not stop taking the antibiotic even if you start to feel better. General instructions Follow instructions from your health care provider about eating restrictions. You may need to avoid solid foods and consume only clear liquids until your condition improves. Return to your normal activities as told by your health care provider. Ask your health care provider what activities are safe for you. Avoid sitting for a long time without moving. Get up to take short walks every 1-2 hours. This is important to improve blood flow and  breathing. Ask for help if you feel weak or unsteady. Keep all follow-up visits as told by your health  care provider. This is important. How is this prevented? After having a bowel obstruction, you are more likely to have another. You may do the following things to prevent another obstruction: If you have a long-term (chronic) disease, pay attention to your symptoms and contact your health care provider if you have questions or concerns. Avoid becoming constipated. To prevent or treat constipation, your health care provider may recommend that you: Drink enough fluid to keep your urine pale yellow. Take over-the-counter or prescription medicines. Eat foods that are high in fiber, such as beans, whole grains, and fresh fruits and vegetables. Limit foods that are high in fat and processed sugars, such as fried or sweet foods. Stay active. Exercise for 30 minutes or more, 5 or more days each week. Ask your health care provider which exercises are safe for you. Avoid stress. Find ways to reduce stress, such as meditation, exercise, or taking time for activities that relax you. Instead of eating three large meals each day, eat three small meals with three small snacks. Work with a Data processing manager to make a healthy meal plan that works for you. Do not use any products that contain nicotine or tobacco, such as cigarettes and e-cigarettes. If you need help quitting, ask your health care provider. Contact a health care provider if you: Have a fever. Have chills. Get help right away if you: Have increased pain or cramping. Vomit blood. Have uncontrolled vomiting or nausea. Cannot drink fluids because of vomiting or pain. Become confused. Begin feeling very thirsty (dehydrated). Have severe bloating. Feel extremely weak or you faint. Summary A bowel obstruction is a blockage in the small or large bowel. A bowel obstruction will prevent food and fluids from passing through the bowel as they normally do  during digestion. Treatment for this condition depends on the cause and severity of the problem. It may include fluids and pain medicines through an IV, a simple diet, a nasogastric tube, or surgery. Follow instructions from your health care provider about eating restrictions. You may need to avoid solid foods and consume only clear liquids until your condition improves.

## 2023-07-10 DIAGNOSIS — K630 Abscess of intestine: Secondary | ICD-10-CM | POA: Insufficient documentation

## 2023-07-10 DIAGNOSIS — K631 Perforation of intestine (nontraumatic): Secondary | ICD-10-CM | POA: Diagnosis not present

## 2023-07-10 LAB — CBC
HCT: 30.8 % — ABNORMAL LOW (ref 39.0–52.0)
Hemoglobin: 9.7 g/dL — ABNORMAL LOW (ref 13.0–17.0)
MCH: 26.2 pg (ref 26.0–34.0)
MCHC: 31.5 g/dL (ref 30.0–36.0)
MCV: 83.2 fL (ref 80.0–100.0)
Platelets: 351 10*3/uL (ref 150–400)
RBC: 3.7 MIL/uL — ABNORMAL LOW (ref 4.22–5.81)
RDW: 13.1 % (ref 11.5–15.5)
WBC: 9.5 10*3/uL (ref 4.0–10.5)
nRBC: 0 % (ref 0.0–0.2)

## 2023-07-10 LAB — BASIC METABOLIC PANEL
Anion gap: 10 (ref 5–15)
BUN: 10 mg/dL (ref 6–20)
CO2: 21 mmol/L — ABNORMAL LOW (ref 22–32)
Calcium: 8.5 mg/dL — ABNORMAL LOW (ref 8.9–10.3)
Chloride: 103 mmol/L (ref 98–111)
Creatinine, Ser: 0.76 mg/dL (ref 0.61–1.24)
GFR, Estimated: >60 mL/min (ref >60)
Glucose, Bld: 97 mg/dL (ref 70–99)
Potassium: 3.7 mmol/L (ref 3.5–5.1)
Sodium: 134 mmol/L — ABNORMAL LOW (ref 135–145)

## 2023-07-10 NOTE — Progress Notes (Signed)
Note low-grade temps--- cancel discharge would watch overnight--- will ask general surgery to swing by and see him again tomorrow  Pleas Koch, MD [facility] 5:34 PM

## 2023-07-10 NOTE — Discharge Summary (Signed)
Physician Discharge Summary  Cory Gomez ZOX:[id number] DOB: 12-Feb-1981 DOA: 07/06/2023  PCP: [name], MD  Admit date: 07/06/2023 Discharge date: 07/10/2023  Time spent: 35 minutes  Recommendations for Outpatient Follow-up:  CBC Chem-12 1 week Close follow-up outpatient setting Dr. Michaell Cowing CC him Needs follow-up with Dr. Elesa Massed of psychiatry to oversee psychiatric meds  Discharge Diagnoses:  MAIN problem for hospitalization   Acute diverticulitis with pericolonic abscess not amenable to IR drainage Depression anxiety Chronic vertigo BMI 37  Please see below for itemized issues addressed in HOpsital- refer to other progress notes for clarity if needed  Discharge Condition: Improved  Diet recommendation: Soft heart healthy  Filed Weights   07/06/23 1103 07/06/23 1112  Weight: 131.1 kg 131.1 kg    History of present illness:  42 year old male with anxiety depression Admitted from the emergency room 7/13 with nausea vomiting-thought it was a muscle sprain he awoke on the morning of 7/13 with fever and 10/10 pain  CT abdomen pelvis showed perforated sigmoid diverticulitis with 3 cm pericolonic mass General surgery was consulted and followed and recommended IR to see placed on IV antibiotics   Hospital Course:  Acute diverticulitis with 3 cm pericolonic abscess Patient was initially on Zosyn menance transition to Augmentin on 7/17 as per general surgeon to complete 5 more days IR saw the patient on 7/15 unfortunately this area was not amenable to IR drain Patient will need to consider partial colectomy given his young age and high risk for recurrence I will CC Dr. Michaell Cowing who is aware of this  Anxiety depression follows with Dr. Elesa Massed CC Dr. Elesa Massed as patient is on relatively large doses of Cymbalta bupropion and Cipro: And may need to be weaned eventually He is stable at this time and I have not discontinued any of the meds  Chronic vertigo-May use meclizine as  needed  BMI 37 outpatient discussion regarding options for management    Discharge Exam: Vitals:   07/09/23 1938 07/10/23 0459  BP: (!) 136/90 (!) 146/95  Pulse: 86 88  Resp: 17 16  Temp: 98.5 F (36.9 C) 98.3 F (36.8 C)  SpO2: 97% 98%    Subj on day of d/c   Awake coherent no distress looks comfortable feels comfortable Mild tenderness in left lower quadrant Chest is clear S1-S2 no murmur ROM intact   Discharge Instructions   Discharge Instructions     Call MD for:   Complete by: As directed    FEVER > 101.5 F  (temperatures < 101.5 F are not significant)   Call MD for:  extreme fatigue   Complete by: As directed    Call MD for:  persistant dizziness or light-headedness   Complete by: As directed    Call MD for:  persistant nausea and vomiting   Complete by: As directed    Call MD for:  redness, tenderness, or signs of infection (pain, swelling, redness, odor or green/yellow discharge around incision site)   Complete by: As directed    Call MD for:  severe uncontrolled pain   Complete by: As directed    Diet - low sodium heart healthy   Complete by: As directed    Discharge instructions   Complete by: As directed    See Discharge Instructions If you are not getting better after two weeks or are noticing you are getting worse, contact our office [phone] for further advice.  We may need to adjust your medications, re-evaluate you in the office,  send you to the emergency room, or see what other things we can do to help. The clinic staff is available to answer your questions during regular business hours (8:30am-5pm).  Please don't hesitate to call and ask to speak to one of our nurses for clinical concerns.    A surgeon from [facility] is always on call at the hospitals 24 hours/day If you have a medical emergency, go to the nearest emergency room or call 911.   Discharge instructions   Complete by: As directed    Please complete all  antibiotics and follow-up with Dr. Michaell Cowing Follow-up with your regular physician as well as your psychiatrist for routine labs High fevers chills nausea vomiting any other symptoms or increasing left lower quadrant pain you need to be evaluated in the emergency room   Driving Restrictions   Complete by: As directed    You may drive when: - you are no longer taking narcotic prescription pain medication - you can comfortably wear a seatbelt - you can safely make sudden turns/stops without pain.   Increase activity slowly   Complete by: As directed    Start light daily activities --- self-care, walking, climbing stairs- beginning the day after surgery.  Gradually increase activities as tolerated.  Control your pain to be active.  Stop when you are tired.  Ideally, walk several times a day, eventually an hour a day.   Most people are back to most day-to-day activities in a few weeks.  It takes 4-6 weeks to get back to unrestricted, intense activity. If you can walk 30 minutes without difficulty, it is safe to try more intense activity such as jogging, treadmill, bicycling, low-impact aerobics, swimming, etc. Save the most intensive and strenuous activity for last (Usually 4-8 weeks after surgery) such as sit-ups, heavy lifting, contact sports, etc.  Refrain from any intense heavy lifting or straining until you are off narcotics for pain control.  You will have off days, but things should improve week-by-week. DO NOT PUSH THROUGH PAIN.  Let pain be your guide: If it hurts to do something, don't do it.   Increase activity slowly   Complete by: As directed    Lifting restrictions   Complete by: As directed    If you can walk 30 minutes without difficulty, it is safe to try more intense activity such as jogging, treadmill, bicycling, low-impact aerobics, swimming, etc. Save the most intensive and strenuous activity for last (Usually 4-8 weeks after surgery) such as sit-ups, heavy lifting, contact sports,  etc.   Refrain from any intense heavy lifting or straining until you are off narcotics for pain control.  You will have off days, but things should improve week-by-week. DO NOT PUSH THROUGH PAIN.  Let pain be your guide: If it hurts to do something, don't do it.  Pain is your body warning you to avoid that activity for another week until the pain goes down.   May shower / Bathe   Complete by: As directed    May walk up steps   Complete by: As directed    Sexual Activity Restrictions   Complete by: As directed    You may have sexual intercourse when it is comfortable. If it hurts to do something, stop.      Allergies as of 07/10/2023   No Known Allergies      Medication List     TAKE these medications    acetaminophen 500 MG tablet Commonly known as: TYLENOL Take  500-1,000 mg by mouth every 6 (six) hours as needed for mild pain or headache.   amoxicillin-clavulanate 875-125 MG tablet Commonly known as: AUGMENTIN Take 1 tablet by mouth 2 (two) times daily for 5 days.   buPROPion 150 MG 24 hr tablet Commonly known as: Wellbutrin XL Take 1 tablet (150 mg total) by mouth every morning.   DULoxetine 30 MG capsule Commonly known as: Cymbalta Take 3 capsules (90 mg total) by mouth daily.   eszopiclone 2 MG Tabs tablet Commonly known as: Lunesta Take 1 tablet (2 mg total) by mouth at bedtime as needed for sleep. Take immediately before bedtime What changed: when to take this   Loratadine 5 MG Tbdp Take 1 tablet (5 mg total) by mouth daily. What changed:  when to take this reasons to take this   meclizine 25 MG tablet Commonly known as: ANTIVERT Take 25 mg by mouth 3 (three) times daily as needed for dizziness.   Vitamin D (Ergocalciferol) 1.25 MG (50000 UNIT) Caps capsule Commonly known as: DRISDOL Take 1 capsule (50,000 Units total) by mouth every 7 (seven) days. What changed: when to take this       No Known Allergies  Follow-up Information     Karie Soda,  MD Follow up in 3 week(s).   Specialties: General Surgery, Colon and Rectal Surgery Why: To follow up after your hospital stay Contact information: [address] [place] [address] [phone]                  The results of significant diagnostics from this hospitalization (including imaging, microbiology, ancillary and laboratory) are listed below for reference.    Significant Diagnostic Studies: CT ABDOMEN PELVIS W CONTRAST  Result Date: 07/06/2023 CLINICAL DATA:  Acute lower abdominal pain. EXAM: CT ABDOMEN AND PELVIS WITH CONTRAST TECHNIQUE: Multidetector CT imaging of the abdomen and pelvis was performed using the standard protocol following bolus administration of intravenous contrast. RADIATION DOSE REDUCTION: This exam was performed according to the departmental dose-optimization program which includes automated exposure control, adjustment of the mA and/or kV according to patient size and/or use of iterative reconstruction technique. CONTRAST:  OMNIPAQUE IOHEXOL 300 MG/ML  SOLN COMPARISON:  Noncontrast CT on 06/15/2010 FINDINGS: Lower Chest: No acute findings. Hepatobiliary: No suspicious hepatic masses identified. Gallbladder is unremarkable. No evidence of biliary ductal dilatation. Pancreas:  No mass or inflammatory changes. Spleen: Within normal limits in size and appearance. Adrenals/Urinary Tract: No suspicious masses identified. No evidence of ureteral calculi or hydronephrosis. Stomach/Bowel: Normal appendix visualized. Moderate to severe wall thickening is seen involving the sigmoid colon with surrounding pericolonic inflammatory changes. No definite diverticular disease identified by CT. Extraluminal gas and fluid collection is seen within the central sigmoid mesocolon, measuring 3.3 x 3.2 cm. This is consistent with sigmoid diverticulitis or colitis with pericolonic abscess. No evidence of free intraperitoneal air. Vascular/Lymphatic: No  pathologically enlarged lymph nodes. No acute vascular findings. Reproductive:  No mass or other significant abnormality. Other:  None. Musculoskeletal:  No suspicious bone lesions identified. IMPRESSION: Perforated sigmoid colitis or diverticulitis, with 3 cm pericolonic abscess in sigmoid mesocolon. Electronically Signed   By: Danae Orleans M.D.   On: 07/06/2023 13:58    Microbiology: Recent Results (from the past 240 hour(s))  SARS Coronavirus 2 by RT PCR (hospital order, performed in [facility] hospital lab) *cepheid single result test* Anterior Nasal Swab     Status: None   Collection Time: 07/06/23 12:36 PM  Specimen: Anterior Nasal Swab  Result Value Ref Range Status   SARS Coronavirus 2 by RT PCR NEGATIVE NEGATIVE Final    Comment: (NOTE) SARS-CoV-2 target nucleic acids are NOT DETECTED.  The SARS-CoV-2 RNA is generally detectable in upper and lower respiratory specimens during the acute phase of infection. The lowest concentration of SARS-CoV-2 viral copies this assay can detect is 250 copies / mL. A negative result does not preclude SARS-CoV-2 infection and should not be used as the sole basis for treatment or other patient management decisions.  A negative result may occur with improper specimen collection / handling, submission of specimen other than nasopharyngeal swab, presence of viral mutation(s) within the areas targeted by this assay, and inadequate number of viral copies (<250 copies / mL). A negative result must be combined with clinical observations, patient history, and epidemiological information.  Fact Sheet for Patients:   RoadLapTop.co.za  Fact Sheet for Healthcare Providers: http://kim-miller.com/  This test is not yet approved or  cleared by the [place] FDA and has been authorized for detection and/or diagnosis of SARS-CoV-2 by FDA under an Emergency Use Authorization (EUA).  This EUA will remain in effect  (meaning this test can be used) for the duration of the COVID-19 declaration under Section 564(b)(1) of the Act, 21 U.S.C. section 360bbb-3(b)(1), unless the authorization is terminated or revoked sooner.  Performed at [facility], [address] [place], [place] [address]      Labs: Basic Metabolic Panel: Recent Labs  Lab 07/06/23 1236 07/06/23 2015 07/07/23 0422 07/09/23 0417 07/10/23 0431  NA 135  --  137 133* 134*  K 3.5  --  3.6 3.6 3.7  CL 104  --  107 100 103  CO2 24  --  23 25 21*  GLUCOSE 106*  --  96 99 97  BUN 15  --  12 7 10   CREATININE 0.78 0.88 0.82 0.86 0.76  CALCIUM 8.5*  --  8.2* 8.4* 8.5*   Liver Function Tests: Recent Labs  Lab 07/06/23 1236  AST 18  ALT 23  ALKPHOS 52  BILITOT 0.4  PROT 7.0  ALBUMIN 3.2*   Recent Labs  Lab 07/06/23 1236  LIPASE 30   No results for input(s): "AMMONIA" in the last 168 hours. CBC: Recent Labs  Lab 07/06/23 1236 07/06/23 2015 07/07/23 0422 07/09/23 0417 07/10/23 0431  WBC 8.1 8.8 8.3 9.4 9.5  NEUTROABS  --   --  6.0  --   --   HGB 10.2* 9.8* 9.5* 9.7* 9.7*  HCT 31.2* 31.2* 30.6* 30.9* 30.8*  MCV 80.2 83.9 83.8 83.3 83.2  PLT 297 297 278 341 351   Cardiac Enzymes: No results for input(s): "CKTOTAL", "CKMB", "CKMBINDEX", "TROPONINI" in the last 168 hours. BNP: BNP (last 3 results) No results for input(s): "BNP" in the last 8760 hours.  ProBNP (last 3 results) No results for input(s): "PROBNP" in the last 8760 hours.  CBG: No results for input(s): "GLUCAP" in the last 168 hours.     Signed:  Rhetta Mura MD   [facility] 07/10/2023, 11:50 AM

## 2023-07-10 NOTE — Progress Notes (Signed)
Routine vitals performed.  BP and temp were higher.  Rechecked approx 30 min later.  MD notified.  Will continue to monitor and holding plans for discharge

## 2023-07-10 NOTE — Progress Notes (Signed)
07/10/2023  Cory Gomez [id number] 05-Mar-1981  CARE TEAM: PCP: [name], MD  Outpatient Care Team: Patient Care Team: [name], MD as PCP - General (Family Medicine) Karie Soda, MD as Consulting Physician (General Surgery)  Inpatient Treatment Team: Treatment Team:  Rhetta Mura, MD [facility], Md, MD [place], [place], [place] Paudel, Bishnu D, RN Merlyn Albert, MD Lucia Gaskins, [facility] Sanda Klein, RN   Problem List:   Principal Problem:   Perforated sigmoid colon [facility]) Active Problems:   Prediabetes   ADD (attention deficit disorder)   GAD (generalized anxiety disorder)   Hypertension   Obesity, Class II, BMI 35-39.9   * No surgery found *      Assessment [facility] Stay = 4 days)      First episode of complex diverticulitis with abscess not amenablew to drainage- gradually improving    Plan:  Continue IV antibiotics.  Agree with piperacillin/tazobactam given complex attack.  Solid low fiber diet.    If continues to improve, hopefully discharge later today with transition to oral antibiotics (Augmentin x 5d) with close outpatient follow-up.  If worsens, repeat CAT scan to see if collections improve/worsen/new diet could benefit from reconsideration of drainage versus need for [facility].  Hopefully less likely now at this point.  Pathophysiology of diverticulitis with its natural history was discussed.  Given his young age with a complicated attack with abscess, I suspect his risk of recurrence and other issues is rather high and he could benefit from segmental sigmoid colectomy at some point.  Hopefully we can get this infection under control with close outpatient follow-up to consider colonoscopy and then robotic sigmoid colectomy in 6-8 weeks.  But in orders in our office is working to help coordinate this.  If he worsens or does not improve by hospital day #5, repeat CAT scan to see if he has progression in his abscess.  If the abscess is  worse or not amenable drainage may need operative washout and possible Hartmann resection.  We will see.  He seems to be improving and ileus seems to be resolving so hopefully we can avoid urgent Hartmann resection at this time.  VTE prophylaxis- SCDs, etc  Mobilize as tolerated to help recovery  -Disposition: d/w [facility] Dr Mahala Menghini - Probable 7/17 with oral antibiotics.       I reviewed nursing notes, hospitalist notes, last 24 h vitals and pain scores, last 48 h intake and output, last 24 h labs and trends, and last 24 h imaging results.  I have reviewed this patient's available data, including medical history, events of note, test results, etc as part of my evaluation.   A significant portion of that time was spent in counseling. Care during the described time interval was provided by me.  This care required moderate level of medical decision making.  07/10/2023    Subjective: (Chief complaint)  Pain less.  Trying thicker liquids.  Walking better.  Having flatus and some bowel movements.  No more nausea.  Objective:  Vital signs:  Vitals:   07/09/23 0557 07/09/23 1338 07/09/23 1938 07/10/23 0459  BP: 138/89 (!) 141/97 (!) 136/90 (!) 146/95  Pulse: 83 86 86 88  Resp: 18 18 17 16   Temp: 98.3 F (36.8 C) 98.2 F (36.8 C) 98.5 F (36.9 C) 98.3 F (36.8 C)  TempSrc: Oral Oral Oral Oral  SpO2: 98% 97% 97% 98%  Weight:      Height:        Last BM  Date : 07/09/23  Intake/Output   Yesterday:  07/16 0701 - 07/17 0700 In: 3457.9 [P.O.:1630; I.V.:1579.7; IV Piggyback:248.2] Out: 1900 [Urine:1900] This shift:  No intake/output data recorded.  Bowel function:  Flatus: YES  BM:  YES  Drain: (No drain)   Physical Exam:  General: Pt awake/alert in no acute distress Eyes: PERRL, normal EOM.  Sclera clear.  No icterus Neuro: CN II-XII intact w/o focal sensory/motor deficits. Lymph: No head/neck/groin lymphadenopathy Psych:  No delerium/psychosis/paranoia.   Oriented x 4 HENT: Normocephalic, Mucus membranes moist.  No thrush Neck: Supple, No tracheal deviation.  No obvious thyromegaly Chest: No pain to chest wall compression.  Good respiratory excursion.  No audible wheezing CV:  Pulses intact.  Regular rhythm.  No major extremity edema MS: Normal AROM mjr joints.  No obvious deformity  Abdomen: Soft.  Mildy distended.  Tenderness at suprapubic/LLQ - mild - improved .  No evidence of peritonitis.  No incarcerated hernias.  Ext:  No deformity.  No mjr edema.  No cyanosis Skin: No petechiae / purpurea.  No major sores.  Warm and dry    Results:   Cultures: Recent Results (from the past 720 hour(s))  SARS Coronavirus 2 by RT PCR (hospital order, performed in [facility] hospital lab) *cepheid single result test* Anterior Nasal Swab     Status: None   Collection Time: 07/06/23 12:36 PM   Specimen: Anterior Nasal Swab  Result Value Ref Range Status   SARS Coronavirus 2 by RT PCR NEGATIVE NEGATIVE Final    Comment: (NOTE) SARS-CoV-2 target nucleic acids are NOT DETECTED.  The SARS-CoV-2 RNA is generally detectable in upper and lower respiratory specimens during the acute phase of infection. The lowest concentration of SARS-CoV-2 viral copies this assay can detect is 250 copies / mL. A negative result does not preclude SARS-CoV-2 infection and should not be used as the sole basis for treatment or other patient management decisions.  A negative result may occur with improper specimen collection / handling, submission of specimen other than nasopharyngeal swab, presence of viral mutation(s) within the areas targeted by this assay, and inadequate number of viral copies (<250 copies / mL). A negative result must be combined with clinical observations, patient history, and epidemiological information.  Fact Sheet for Patients:   RoadLapTop.co.za  Fact Sheet for Healthcare  Providers: http://kim-miller.com/  This test is not yet approved or  cleared by the [place] FDA and has been authorized for detection and/or diagnosis of SARS-CoV-2 by FDA under an Emergency Use Authorization (EUA).  This EUA will remain in effect (meaning this test can be used) for the duration of the COVID-19 declaration under Section 564(b)(1) of the Act, 21 U.S.C. section 360bbb-3(b)(1), unless the authorization is terminated or revoked sooner.  Performed at [facility], [address] [place], [place] [address]     Labs: Results for orders placed or performed during the hospital encounter of 07/06/23 (from the past 48 hour(s))  CBC     Status: Abnormal   Collection Time: 07/09/23  4:17 AM  Result Value Ref Range   WBC 9.4 4.0 - 10.5 K/uL   RBC 3.71 (L) 4.22 - 5.81 MIL/uL   Hemoglobin 9.7 (L) 13.0 - 17.0 g/dL   HCT 60.4 (L) 54.0 - 98.1 %   MCV 83.3 80.0 - 100.0 fL   MCH 26.1 26.0 - 34.0 pg   MCHC 31.4 30.0 - 36.0 g/dL   RDW 19.1 47.8 - 29.5 %  Platelets 341 150 - 400 K/uL   nRBC 0.0 0.0 - 0.2 %    Comment: Performed at [facility], [address] [place], [place] [address]  Basic metabolic panel     Status: Abnormal   Collection Time: 07/09/23  4:17 AM  Result Value Ref Range   Sodium 133 (L) 135 - 145 mmol/L   Potassium 3.6 3.5 - 5.1 mmol/L   Chloride 100 98 - 111 mmol/L   CO2 25 22 - 32 mmol/L   Glucose, Bld 99 70 - 99 mg/dL    Comment: Glucose reference range applies only to samples taken after fasting for at least 8 hours.   BUN 7 6 - 20 mg/dL   Creatinine, Ser 9.14 0.61 - 1.24 mg/dL   Calcium 8.4 (L) 8.9 - 10.3 mg/dL   GFR, Estimated >78 >29 mL/min    Comment: (NOTE) Calculated using the CKD-EPI Creatinine Equation (2021)    Anion gap 8 5 - 15    Comment: Performed at [facility], [address] [place], [place] [address]  CBC     Status: Abnormal   Collection Time: 07/10/23   4:31 AM  Result Value Ref Range   WBC 9.5 4.0 - 10.5 K/uL   RBC 3.70 (L) 4.22 - 5.81 MIL/uL   Hemoglobin 9.7 (L) 13.0 - 17.0 g/dL   HCT 08.6 (L) 57.8 - 46.9 %   MCV 83.2 80.0 - 100.0 fL   MCH 26.2 26.0 - 34.0 pg   MCHC 31.5 30.0 - 36.0 g/dL   RDW 62.9 52.8 - 41.3 %   Platelets 351 150 - 400 K/uL   nRBC 0.0 0.0 - 0.2 %    Comment: Performed at [facility], [address] [place], [place] [address]  Basic metabolic panel     Status: Abnormal   Collection Time: 07/10/23  4:31 AM  Result Value Ref Range   Sodium 134 (L) 135 - 145 mmol/L   Potassium 3.7 3.5 - 5.1 mmol/L   Chloride 103 98 - 111 mmol/L   CO2 21 (L) 22 - 32 mmol/L   Glucose, Bld 97 70 - 99 mg/dL    Comment: Glucose reference range applies only to samples taken after fasting for at least 8 hours.   BUN 10 6 - 20 mg/dL   Creatinine, Ser 0.27 0.61 - 1.24 mg/dL   Calcium 8.5 (L) 8.9 - 10.3 mg/dL   GFR, Estimated >25 >36 mL/min    Comment: (NOTE) Calculated using the CKD-EPI Creatinine Equation (2021)    Anion gap 10 5 - 15    Comment: Performed at [facility], [address] [place], [place] [address]    Imaging / Studies: No results found.  Medications / Allergies: per chart  Antibiotics: Anti-infectives (From admission, onward)    Start     Dose/Rate Route Frequency Ordered Stop   07/09/23 0000  amoxicillin-clavulanate (AUGMENTIN) 875-125 MG tablet        1 tablet Oral 2 times daily 07/09/23 0854 07/14/23 2359   07/06/23 2200  piperacillin-tazobactam (ZOSYN) IVPB 3.375 g        3.375 g 12.5 mL/hr over 240 Minutes Intravenous Every 8 hours 07/06/23 1851     07/06/23 1530  piperacillin-tazobactam (ZOSYN) IVPB 3.375 g        3.375 g 100 mL/hr over 30 Minutes Intravenous  Once 07/06/23 1521 07/06/23 1555         Note: Portions of this  report may have been transcribed using voice recognition software. Every effort was made to ensure accuracy; however, inadvertent  computerized transcription errors may be present.   Any transcriptional errors that result from this process are unintentional.    Ardeth Sportsman, MD, FACS, MASCRS Esophageal, Gastrointestinal & Colorectal Surgery Robotic and Minimally Invasive Surgery  [facility] [address] [place], [place] [address] [phone] Fax [phone] Main  CONTACT INFORMATION:  Weekday (9AM-5PM): Call [facility] main office at [phone]  Weeknight (5PM-9AM) or Weekend/Holiday: Check [email or web address] (password " [email or web address]") for General Surgery [facility] coverage  (Please, do not use SecureChat as it is not reliable communication to reach operating surgeons for immediate patient care given surgeries/outpatient duties/clinic/cross-coverage/off post-call which would lead to a delay in care.  Epic staff messaging available for outptient concerns, but may not be answered for 48 hours or more).     07/10/2023  7:30 AM

## 2023-07-11 ENCOUNTER — Inpatient Hospital Stay (HOSPITAL_COMMUNITY): Payer: BC Managed Care – PPO

## 2023-07-11 DIAGNOSIS — K631 Perforation of intestine (nontraumatic): Secondary | ICD-10-CM | POA: Diagnosis not present

## 2023-07-11 LAB — BASIC METABOLIC PANEL
Anion gap: 11 (ref 5–15)
BUN: 8 mg/dL (ref 6–20)
CO2: 20 mmol/L — ABNORMAL LOW (ref 22–32)
Calcium: 8.7 mg/dL — ABNORMAL LOW (ref 8.9–10.3)
Chloride: 103 mmol/L (ref 98–111)
Creatinine, Ser: 0.9 mg/dL (ref 0.61–1.24)
GFR, Estimated: >60 mL/min (ref >60)
Glucose, Bld: 100 mg/dL — ABNORMAL HIGH (ref 70–99)
Potassium: 3.7 mmol/L (ref 3.5–5.1)
Sodium: 134 mmol/L — ABNORMAL LOW (ref 135–145)

## 2023-07-11 LAB — CBC
HCT: 32.1 % — ABNORMAL LOW (ref 39.0–52.0)
Hemoglobin: 10.2 g/dL — ABNORMAL LOW (ref 13.0–17.0)
MCH: 26.2 pg (ref 26.0–34.0)
MCHC: 31.8 g/dL (ref 30.0–36.0)
MCV: 82.3 fL (ref 80.0–100.0)
Platelets: 370 10*3/uL (ref 150–400)
RBC: 3.9 MIL/uL — ABNORMAL LOW (ref 4.22–5.81)
RDW: 13.2 % (ref 11.5–15.5)
WBC: 12.4 10*3/uL — ABNORMAL HIGH (ref 4.0–10.5)
nRBC: 0 % (ref 0.0–0.2)

## 2023-07-11 MED ORDER — IOHEXOL 300 MG/ML  SOLN
100.0000 mL | Freq: Once | INTRAMUSCULAR | Status: AC | PRN
  Administered 2023-07-11: 100 mL via INTRAVENOUS

## 2023-07-11 MED ORDER — IOHEXOL 9 MG/ML PO SOLN
500.0000 mL | ORAL | Status: AC
  Administered 2023-07-11 (×2): 500 mL via ORAL

## 2023-07-11 NOTE — Plan of Care (Signed)

## 2023-07-11 NOTE — Plan of Care (Signed)
  Problem: Education: Goal: Knowledge of General Education information will improve Description Including pain rating scale, medication(s)/side effects and non-pharmacologic comfort measures Outcome: Progressing   Problem: Health Behavior/Discharge Planning: Goal: Ability to manage health-related needs will improve Outcome: Progressing   

## 2023-07-11 NOTE — Progress Notes (Signed)
Patient ID: Cory Gomez, male   DOB: 02/26/1981, 42 y.o.   MRN: [id number] Request received from surgery for consideration of abdominal abscess drain placement in patient.  Latest imaging studies were reviewed by Dr. Fredia Sorrow and there is currently no window for percutaneous drain placement.  Per Dr. Fredia Sorrow he thinks fluid component of abscess is also smaller.

## 2023-07-11 NOTE — Progress Notes (Signed)
HOSPITALIST ROUNDING NOTE Cory Gomez OVF:[id number]  DOB: 10-Jan-1981  DOA: 07/06/2023  PCP: [name], MD  07/11/2023,3:13 PM   LOS: 5 days      Code Status: Full   From: Home  current Dispo: Home     42 year old white male with known severe anxiety/depression not on meds Admit 07/06/2019 1:24 week intractable abdominal pain 10/10 nature no blood in emesis or in his  He was found in the ER to have perforated sigmoid colitis/diverticulitis with 3 cm pericolonic abscess and sigmoid colon Was seen immediately by general surgery 7/15 subsequently evaluated by IR with 3.3 cm gas containing abscess which was surrounded by bowel and vasculature unable to access via percutaneous approach Patient was supposed to be discharged on 717 had low-grade fever increasing pain 7/18 CT repeat severe sigmoid diverticulitis with continued pericolonic abscess consistent with perforation increased wall thickening and inflammation noted-currently 5.1 X2.8 abscess present compared to 3 cm previously  Plan  Perforated colonic abscess Defer to general surgery next steps and plan-appears that there is no window for percutaneous drainage Continue at the time Zosyn May need surgery?  Diet as per general surgery Pain control Dilaudid 1 mg every 4 as needed Continues on saline 50 cc/H with K20   Depression Continue Wellbutrin 150 daily, Cymbalta 90 daily pain control   Discussed with mother at the bedside today  DVT prophylaxis: Lovenox  Status is: Inpatient Remains inpatient appropriate because:   May require surgery    Subjective: Coherent pleasant some abdominal pain not 2 different than prior not needing meds Curious about plan from surgeon No chest pain  Objective + exam Vitals:   07/10/23 1625 07/10/23 2136 07/11/23 0515 07/11/23 1330  BP: (!) 170/96 (!) 145/90 (!) 144/84 (!) 145/75  Pulse: 94 92 92 85  Resp: 18 18 18 17   Temp: 100.3 F (37.9 C) 98.8 F (37.1 C) 99.2 F (37.3 C)  98.7 F (37.1 C)  TempSrc: Oral Oral Oral Oral  SpO2: 98% 97% 97% 98%  Weight:      Height:       Filed Weights   07/06/23 1103 07/06/23 1112  Weight: 131.1 kg 131.1 kg    Examination:  Anxious EOMI NCAT no icterus no pallor no wheeze no rales no rhonchi Chest clear no added sound Abdomen obese nontender no rebound no guarding ROM intact  Data Reviewed: reviewed   CBC    Component Value Date/Time   WBC 12.4 (H) 07/11/2023 0511   RBC 3.90 (L) 07/11/2023 0511   HGB 10.2 (L) 07/11/2023 0511   HCT 32.1 (L) 07/11/2023 0511   PLT 370 07/11/2023 0511   MCV 82.3 07/11/2023 0511   MCH 26.2 07/11/2023 0511   MCHC 31.8 07/11/2023 0511   RDW 13.2 07/11/2023 0511   LYMPHSABS 1.2 07/07/2023 0422   MONOABS 0.8 07/07/2023 0422   EOSABS 0.2 07/07/2023 0422   BASOSABS 0.1 07/07/2023 0422      Latest Ref Rng & Units 07/11/2023    5:11 AM 07/10/2023    4:31 AM 07/09/2023    4:17 AM  CMP  Glucose 70 - 99 mg/dL 841  97  99   BUN 6 - 20 mg/dL 8  10  7    Creatinine 0.61 - 1.24 mg/dL 6.60  6.30  1.60   Sodium 135 - 145 mmol/L 134  134  133   Potassium 3.5 - 5.1 mmol/L 3.7  3.7  3.6   Chloride 98 - 111 mmol/L 103  103  100   CO2 22 - 32 mmol/L 20  21  25    Calcium 8.9 - 10.3 mg/dL 8.7  8.5  8.4      Scheduled Meds:  acetaminophen  1,000 mg Oral Q6H   buPROPion  150 mg Oral Daily   DULoxetine  90 mg Oral Daily   enoxaparin (LOVENOX) injection  60 mg Subcutaneous QHS   zolpidem  5 mg Oral QHS   Continuous Infusions:  sodium chloride 10 mL/hr at 07/10/23 0548   0.9 % NaCl with KCl 20 mEq / L 50 mL/hr at 07/10/23 2215   methocarbamol (ROBAXIN) IV     piperacillin-tazobactam (ZOSYN)  IV 3.375 g (07/11/23 1322)    Time 25  Rhetta Mura, MD  [facility]

## 2023-07-11 NOTE — Progress Notes (Signed)
Patient ID: Cory Gomez, male   DOB: April 02, 1981, 42 y.o.   MRN: [id number] [facility] Surgery Progress Note     Subjective: CC-  Low grade temps over night and WBC up to 12.4. denies any worsening abdominal pain. Reports 2/10 LLQ pain. No n/v. Tolerating diet although not eating a lot. Passing flatus and had a BM yesterday.  Objective: Vital signs in last 24 hours: Temp:  [98.8 F (37.1 C)-100.3 F (37.9 C)] 99.2 F (37.3 C) (07/18 0515) Pulse Rate:  [92-97] 92 (07/18 0515) Resp:  [17-18] 18 (07/18 0515) BP: (144-184)/(84-112) 144/84 (07/18 0515) SpO2:  [97 %-99 %] 97 % (07/18 0515) Last BM Date : 07/10/23  Intake/Output from previous day: 07/17 0701 - 07/18 0700 In: 2325.5 [P.O.:730; I.V.:1439.9; IV Piggyback:155.6] Out: 1900 [Urine:1900] Intake/Output this shift: No intake/output data recorded.  PE: Gen:  Alert, NAD, pleasant Pulm:  rate and effort normal on room air Abd: soft, ND, very mild LLQ TTP without rebound or guarding  Lab Results:  Recent Labs    07/10/23 0431 07/11/23 0511  WBC 9.5 12.4*  HGB 9.7* 10.2*  HCT 30.8* 32.1*  PLT 351 370   BMET Recent Labs    07/10/23 0431 07/11/23 0511  NA 134* 134*  K 3.7 3.7  CL 103 103  CO2 21* 20*  GLUCOSE 97 100*  BUN 10 8  CREATININE 0.76 0.90  CALCIUM 8.5* 8.7*   PT/INR No results for input(s): "LABPROT", "INR" in the last 72 hours. CMP     Component Value Date/Time   NA 134 (L) 07/11/2023 0511   K 3.7 07/11/2023 0511   CL 103 07/11/2023 0511   CO2 20 (L) 07/11/2023 0511   GLUCOSE 100 (H) 07/11/2023 0511   BUN 8 07/11/2023 0511   CREATININE 0.90 07/11/2023 0511   CALCIUM 8.7 (L) 07/11/2023 0511   PROT 7.0 07/06/2023 1236   ALBUMIN 3.2 (L) 07/06/2023 1236   AST 18 07/06/2023 1236   ALT 23 07/06/2023 1236   ALKPHOS 52 07/06/2023 1236   BILITOT 0.4 07/06/2023 1236   GFRNONAA >60 07/11/2023 0511   Lipase     Component Value Date/Time   LIPASE 30 07/06/2023 1236        Studies/Results: No results found.  Anti-infectives: Anti-infectives (From admission, onward)    Start     Dose/Rate Route Frequency Ordered Stop   07/09/23 0000  amoxicillin-clavulanate (AUGMENTIN) 875-125 MG tablet        1 tablet Oral 2 times daily 07/09/23 0854 07/14/23 2359   07/06/23 2200  piperacillin-tazobactam (ZOSYN) IVPB 3.375 g        3.375 g 12.5 mL/hr over 240 Minutes Intravenous Every 8 hours 07/06/23 1851     07/06/23 1530  piperacillin-tazobactam (ZOSYN) IVPB 3.375 g        3.375 g 100 mL/hr over 30 Minutes Intravenous  Once 07/06/23 1521 07/06/23 1555        Assessment/Plan Complex diverticulitis with abscess - CT scan 7/13 perforated sigmoid colitis or diverticulitis with 3 cm pericolonic abscess in sigmoid mesocolon - not amenable to IR drainage - No worsening pain but developed low grade temps and worsening leukocytosis. Will obtain repeat CT scan today. Continue IV zosyn.  ID - zosyn FEN - IVF, NPO VTE - SCDs, lovenox Foley - none  Obesity BMI 37.11 Anxiety/ depression  I reviewed hospitalist notes, last 24 h vitals and pain scores, last 48 h intake and output, and last 24 h labs and trends.  LOS: 5 days    Franne Forts, [facility] Surgery 07/11/2023, 11:14 AM Please see Amion for pager number during day hours 7:00am-4:30pm

## 2023-07-12 DIAGNOSIS — K631 Perforation of intestine (nontraumatic): Secondary | ICD-10-CM | POA: Diagnosis not present

## 2023-07-12 LAB — BASIC METABOLIC PANEL
Anion gap: 9 (ref 5–15)
BUN: 11 mg/dL (ref 6–20)
CO2: 22 mmol/L (ref 22–32)
Calcium: 8.5 mg/dL — ABNORMAL LOW (ref 8.9–10.3)
Chloride: 102 mmol/L (ref 98–111)
Creatinine, Ser: 0.85 mg/dL (ref 0.61–1.24)
GFR, Estimated: >60 mL/min (ref >60)
Glucose, Bld: 85 mg/dL (ref 70–99)
Potassium: 3.8 mmol/L (ref 3.5–5.1)
Sodium: 133 mmol/L — ABNORMAL LOW (ref 135–145)

## 2023-07-12 LAB — CBC
HCT: 32 % — ABNORMAL LOW (ref 39.0–52.0)
Hemoglobin: 9.9 g/dL — ABNORMAL LOW (ref 13.0–17.0)
MCH: 25.8 pg — ABNORMAL LOW (ref 26.0–34.0)
MCHC: 30.9 g/dL (ref 30.0–36.0)
MCV: 83.3 fL (ref 80.0–100.0)
Platelets: 391 10*3/uL (ref 150–400)
RBC: 3.84 MIL/uL — ABNORMAL LOW (ref 4.22–5.81)
RDW: 13.2 % (ref 11.5–15.5)
WBC: 10.2 10*3/uL (ref 4.0–10.5)
nRBC: 0 % (ref 0.0–0.2)

## 2023-07-12 MED ORDER — HYDROMORPHONE HCL 1 MG/ML IJ SOLN: 0.5000 mg | INTRAMUSCULAR | Status: DC | PRN

## 2023-07-12 MED ORDER — LACTATED RINGERS IV BOLUS: 1000.0000 mL | Freq: Three times a day (TID) | INTRAVENOUS | Status: DC | PRN

## 2023-07-12 NOTE — Plan of Care (Signed)
°  Problem: Clinical Measurements: Goal: Will remain free from infection Outcome: Progressing   Problem: Activity: Goal: Risk for activity intolerance will decrease Outcome: Progressing   Problem: Nutrition: Goal: Adequate nutrition will be maintained Outcome: Progressing

## 2023-07-12 NOTE — Progress Notes (Signed)
07/12/2023  [name] [id number] 10/10/1981  CARE TEAM: PCP: [name], MD  Outpatient Care Team: Patient Care Team: [name], MD as PCP - General (Family Medicine) Karie Soda, MD as Consulting Physician (General Surgery)  Inpatient Treatment Team: Treatment Team:  Rhetta Mura, MD [facility], Md, MD Merlyn Albert, MD Redmond Baseman, RN Delila Spence, LPN Lucia Gaskins, [facility] Lowella Bandy, NT   Problem List:   Principal Problem:   Perforated sigmoid colon [facility]) Active Problems:   Prediabetes   ADD (attention deficit disorder)   GAD (generalized anxiety disorder)   Hypertension   Obesity, Class II, BMI 35-39.9   Abscess of sigmoid colon   * No surgery found *      Assessment [facility] Stay = 6 days)      First episode of complex diverticulitis with abscess not amenable to drainage-   Abscess persistent - slightly smaller.  No safe window for IR drainage.  Pain less NPO & WBC now WNL.  Ileus resolving Plan:   Continue IV antibiotics.  Agree with piperacillin/tazobactam given complex attack.  Try liquids only today.    If worse or cannot tolerate PO, then lap washout, probable Hartmann.  The anatomy & physiology of the digestive tract was discussed.  The pathophysiology of perforation was discussed.  Differential diagnosis such as perforated ulcer or colon, etc was discussed.   Natural history risks without surgery such as death was discussed.  I recommended abdominal exploration to diagnose & treat the source of the problem.  Laparoscopic & open techniques were discussed.   Risks such as bleeding, infection, abscess, leak, reoperation, bowel resection, possible ostomy, injury to other organs, need for repair of tissues / organs, hernia, heart attack, death, and other risks were discussed.   The risks of no intervention will lead to serious problems including death.   I expressed a good likelihood that surgery will address the problem.     Goals of post-operative recovery were discussed as well.  We will work to minimize complications although risks in an emergent setting are high.   Questions were answered.  The patient & spouse express understanding & wish to proceed with surgery if not better this weekend    If improves & tol solid PO -discharge with transition to oral antibiotics (Augmentin x 5d) with close outpatient follow-up.  -colonoscopy and then robotic sigmoid colectomy in 6-8 weeks.    VTE prophylaxis- SCDs, etc  Mobilize as tolerated to help recovery  -Disposition: TBD       I reviewed nursing notes, hospitalist notes, last 24 h vitals and pain scores, last 48 h intake and output, last 24 h labs and trends, and last 24 h imaging results.  I have reviewed this patient's available data, including medical history, events of note, test results, etc as part of my evaluation.   A significant portion of that time was spent in counseling. Care during the described time interval was provided by me.  This care required moderate level of medical decision making.  07/12/2023    Subjective: (Chief complaint)  Pain less.  Trying thicker liquids.  Walking better.  Having flatus and some bowel movements.  No more nausea.  Objective:  Vital signs:  Vitals:   07/11/23 0515 07/11/23 1330 07/11/23 2020 07/12/23 0636  BP: (!) 144/84 (!) 145/75 (!) 149/89 (!) 145/85  Pulse: 92 85 89 89  Resp: 18 17 18 18   Temp: 99.2 F (37.3 C) 98.7 F (37.1  C) 98.3 F (36.8 C) 98.7 F (37.1 C)  TempSrc: Oral Oral Oral Oral  SpO2: 97% 98% 98% 98%  Weight:      Height:        Last BM Date : 07/10/23  Intake/Output   Yesterday:  07/18 0701 - 07/19 0700 In: 2072.1 [P.O.:360; I.V.:1496.4; IV Piggyback:215.7] Out: 850 [Urine:850] This shift:  No intake/output data recorded.  Bowel function:  Flatus: YES  BM:  YES  Drain: (No drain)   Physical Exam:  General: Pt awake/alert in no acute distress Eyes:  PERRL, normal EOM.  Sclera clear.  No icterus Neuro: CN II-XII intact w/o focal sensory/motor deficits. Lymph: No head/neck/groin lymphadenopathy Psych:  No delerium/psychosis/paranoia.  Oriented x 4 HENT: Normocephalic, Mucus membranes moist.  No thrush Neck: Supple, No tracheal deviation.  No obvious thyromegaly Chest: No pain to chest wall compression.  Good respiratory excursion.  No audible wheezing CV:  Pulses intact.  Regular rhythm.  No major extremity edema MS: Normal AROM mjr joints.  No obvious deformity  Abdomen: Soft.  Mildy distended.  Tenderness at suprapubic/LLQ - mild - improved .  No evidence of peritonitis.  No incarcerated hernias.  Ext:  No deformity.  No mjr edema.  No cyanosis Skin: No petechiae / purpurea.  No major sores.  Warm and dry    Results:   Cultures: Recent Results (from the past 720 hour(s))  SARS Coronavirus 2 by RT PCR (hospital order, performed in [facility] hospital lab) *cepheid single result test* Anterior Nasal Swab     Status: None   Collection Time: 07/06/23 12:36 PM   Specimen: Anterior Nasal Swab  Result Value Ref Range Status   SARS Coronavirus 2 by RT PCR NEGATIVE NEGATIVE Final    Comment: (NOTE) SARS-CoV-2 target nucleic acids are NOT DETECTED.  The SARS-CoV-2 RNA is generally detectable in upper and lower respiratory specimens during the acute phase of infection. The lowest concentration of SARS-CoV-2 viral copies this assay can detect is 250 copies / mL. A negative result does not preclude SARS-CoV-2 infection and should not be used as the sole basis for treatment or other patient management decisions.  A negative result may occur with improper specimen collection / handling, submission of specimen other than nasopharyngeal swab, presence of viral mutation(s) within the areas targeted by this assay, and inadequate number of viral copies (<250 copies / mL). A negative result must be combined with clinical observations, patient  history, and epidemiological information.  Fact Sheet for Patients:   RoadLapTop.co.za  Fact Sheet for Healthcare Providers: http://kim-miller.com/  This test is not yet approved or  cleared by the [place] FDA and has been authorized for detection and/or diagnosis of SARS-CoV-2 by FDA under an Emergency Use Authorization (EUA).  This EUA will remain in effect (meaning this test can be used) for the duration of the COVID-19 declaration under Section 564(b)(1) of the Act, 21 U.S.C. section 360bbb-3(b)(1), unless the authorization is terminated or revoked sooner.  Performed at [facility], [address] [place], [place] [address]     Labs: Results for orders placed or performed during the hospital encounter of 07/06/23 (from the past 48 hour(s))  CBC     Status: Abnormal   Collection Time: 07/11/23  5:11 AM  Result Value Ref Range   WBC 12.4 (H) 4.0 - 10.5 K/uL   RBC 3.90 (L) 4.22 - 5.81 MIL/uL   Hemoglobin 10.2 (L) 13.0 - 17.0 g/dL   HCT 52.8 (  L) 39.0 - 52.0 %   MCV 82.3 80.0 - 100.0 fL   MCH 26.2 26.0 - 34.0 pg   MCHC 31.8 30.0 - 36.0 g/dL   RDW 43.3 29.5 - 18.8 %   Platelets 370 150 - 400 K/uL   nRBC 0.0 0.0 - 0.2 %    Comment: Performed at [facility], [address] [place], [place] [address]  Basic metabolic panel     Status: Abnormal   Collection Time: 07/11/23  5:11 AM  Result Value Ref Range   Sodium 134 (L) 135 - 145 mmol/L   Potassium 3.7 3.5 - 5.1 mmol/L   Chloride 103 98 - 111 mmol/L   CO2 20 (L) 22 - 32 mmol/L   Glucose, Bld 100 (H) 70 - 99 mg/dL    Comment: Glucose reference range applies only to samples taken after fasting for at least 8 hours.   BUN 8 6 - 20 mg/dL   Creatinine, Ser 6.30 0.61 - 1.24 mg/dL   Calcium 8.7 (L) 8.9 - 10.3 mg/dL   GFR, Estimated >16 >01 mL/min    Comment: (NOTE) Calculated using the CKD-EPI Creatinine Equation (2021)    Anion gap 11 5 - 15     Comment: Performed at [facility], [address], [place], [place] [address]  CBC     Status: Abnormal   Collection Time: 07/12/23  5:22 AM  Result Value Ref Range   WBC 10.2 4.0 - 10.5 K/uL   RBC 3.84 (L) 4.22 - 5.81 MIL/uL   Hemoglobin 9.9 (L) 13.0 - 17.0 g/dL   HCT 55.7 (L) 32.2 - 02.5 %   MCV 83.3 80.0 - 100.0 fL   MCH 25.8 (L) 26.0 - 34.0 pg   MCHC 30.9 30.0 - 36.0 g/dL   RDW 42.7 06.2 - 37.6 %   Platelets 391 150 - 400 K/uL   nRBC 0.0 0.0 - 0.2 %    Comment: Performed at [facility], [address], [place], [place] [address]    Imaging / Studies: CT ABDOMEN PELVIS W CONTRAST  Result Date: 07/11/2023 CLINICAL DATA:  History of perforated sigmoid diverticulitis. EXAM: CT ABDOMEN AND PELVIS WITH CONTRAST TECHNIQUE: Multidetector CT imaging of the abdomen and pelvis was performed using the standard protocol following bolus administration of intravenous contrast. RADIATION DOSE REDUCTION: This exam was performed according to the departmental dose-optimization program which includes automated exposure control, adjustment of the mA and/or kV according to patient size and/or use of iterative reconstruction technique. CONTRAST:  OMNIPAQUE IOHEXOL 300 MG/ML  SOLN COMPARISON:  July 06, 2023. FINDINGS: Lower chest: No acute abnormality. Hepatobiliary: No focal liver abnormality is seen. Status post cholecystectomy. No biliary dilatation. Pancreas: Unremarkable. No pancreatic ductal dilatation or surrounding inflammatory changes. Spleen: Normal in size without focal abnormality. Adrenals/Urinary Tract: Adrenal glands are unremarkable. Kidneys are normal, without renal calculi, focal lesion, or hydronephrosis. Bladder is unremarkable. Stomach/Bowel: The stomach and appendix are unremarkable. There is no evidence of bowel obstruction. Severe inflammatory changes are again noted involving the sigmoid colon consistent with a history of perforated  diverticulitis. 5.1 x 2.8 cm pericolonic abscess is noted again. Vascular/Lymphatic: No significant vascular findings are present. No enlarged abdominal or pelvic lymph nodes. Reproductive: Prostate is unremarkable. Other: No hernia is noted. Musculoskeletal: No acute or significant osseous findings. IMPRESSION: Severe sigmoid diverticulitis is again noted with continued presence of pericolonic abscess consistent with perforation as noted on prior study. There does appear to  be increased wall thickening and inflammation involving the more distal sigmoid colon compared to prior exam. Electronically Signed   By: Lupita Raider M.D.   On: 07/11/2023 13:41    Medications / Allergies: per chart  Antibiotics: Anti-infectives (From admission, onward)    Start     Dose/Rate Route Frequency Ordered Stop   07/09/23 0000  amoxicillin-clavulanate (AUGMENTIN) 875-125 MG tablet        1 tablet Oral 2 times daily 07/09/23 0854 07/14/23 2359   07/06/23 2200  piperacillin-tazobactam (ZOSYN) IVPB 3.375 g        3.375 g 12.5 mL/hr over 240 Minutes Intravenous Every 8 hours 07/06/23 1851     07/06/23 1530  piperacillin-tazobactam (ZOSYN) IVPB 3.375 g        3.375 g 100 mL/hr over 30 Minutes Intravenous  Once 07/06/23 1521 07/06/23 1555         Note: Portions of this report may have been transcribed using voice recognition software. Every effort was made to ensure accuracy; however, inadvertent computerized transcription errors may be present.   Any transcriptional errors that result from this process are unintentional.    Ardeth Sportsman, MD, FACS, MASCRS Esophageal, Gastrointestinal & Colorectal Surgery Robotic and Minimally Invasive Surgery  [facility] [address] [place], [place] [address] [phone] Fax [phone] Main  CONTACT INFORMATION:  Weekday (9AM-5PM): Call [facility] main office at [phone]  Weeknight (5PM-9AM)  or Weekend/Holiday: Check [email or web address] (password " [email or web address]") for General Surgery [facility] coverage  (Please, do not use SecureChat as it is not reliable communication to reach operating surgeons for immediate patient care given surgeries/outpatient duties/clinic/cross-coverage/off post-call which would lead to a delay in care.  Epic staff messaging available for outptient concerns, but may not be answered for 48 hours or more).     07/12/2023  8:48 AM

## 2023-07-12 NOTE — Progress Notes (Signed)
HOSPITALIST ROUNDING NOTE Cory Gomez YQM:[id number]  DOB: 1981-06-30  DOA: 07/06/2023  PCP: [name], MD  07/12/2023,4:34 PM   LOS: 6 days      Code Status: Full   From: Home  current Dispo: Home     42 year old white male with known severe anxiety/depression not on meds Admit 07/06/2019 1:24 week intractable abdominal pain 10/10 nature no blood in emesis or in his  He was found in the ER to have perforated sigmoid colitis/diverticulitis with 3 cm pericolonic abscess and sigmoid colon Was seen immediately by general surgery 7/15 subsequently evaluated by IR with 3.3 cm gas containing abscess which was surrounded by bowel and vasculature unable to access via percutaneous approach Patient was supposed to be discharged on 717 had low-grade fever increasing pain 7/18 CT repeat severe sigmoid diverticulitis with continued pericolonic abscess consistent with perforation increased wall thickening and inflammation noted-currently 5.1 X2.8 abscess present compared to 3 cm previously  Plan  Perforated colonic abscess Continue at the time Zosyn--wbc back down May need surgery?  Diet as per general surgery--now on clears with slow graduation per Surgery Pain control Dilaudid 1 mg every 4 as needed--first choice tylenol Continues on saline 50 cc/H with K20  Depression Continue Wellbutrin 150 daily, Cymbalta 90 daily    DVT prophylaxis: Lovenox  Status is: Inpatient Remains inpatient appropriate because:   May require surgery    Subjective:  Fair Tired of liquid diet  Objective + exam Vitals:   07/11/23 1330 07/11/23 2020 07/12/23 0636 07/12/23 1303  BP: (!) 145/75 (!) 149/89 (!) 145/85 138/85  Pulse: 85 89 89 92  Resp: 17 18 18 18   Temp: 98.7 F (37.1 C) 98.3 F (36.8 C) 98.7 F (37.1 C) 98.4 F (36.9 C)  TempSrc: Oral Oral Oral   SpO2: 98% 98% 98% 98%  Weight:      Height:       Filed Weights   07/06/23 1103 07/06/23 1112  Weight: 131.1 kg 131.1 kg     Examination:  Anxious EOMI NCAT no icterus no pallor no wheeze no rales no rhonchi Chest clear no added sound Abdomen obese slight tender RLQ  Data Reviewed: reviewed   CBC    Component Value Date/Time   WBC 10.2 07/12/2023 0522   RBC 3.84 (L) 07/12/2023 0522   HGB 9.9 (L) 07/12/2023 0522   HCT 32.0 (L) 07/12/2023 0522   PLT 391 07/12/2023 0522   MCV 83.3 07/12/2023 0522   MCH 25.8 (L) 07/12/2023 0522   MCHC 30.9 07/12/2023 0522   RDW 13.2 07/12/2023 0522   LYMPHSABS 1.2 07/07/2023 0422   MONOABS 0.8 07/07/2023 0422   EOSABS 0.2 07/07/2023 0422   BASOSABS 0.1 07/07/2023 0422      Latest Ref Rng & Units 07/12/2023    5:22 AM 07/11/2023    5:11 AM 07/10/2023    4:31 AM  CMP  Glucose 70 - 99 mg/dL 85  528  97   BUN 6 - 20 mg/dL 11  8  10    Creatinine 0.61 - 1.24 mg/dL 4.13  2.44  0.10   Sodium 135 - 145 mmol/L 133  134  134   Potassium 3.5 - 5.1 mmol/L 3.8  3.7  3.7   Chloride 98 - 111 mmol/L 102  103  103   CO2 22 - 32 mmol/L 22  20  21    Calcium 8.9 - 10.3 mg/dL 8.5  8.7  8.5      Scheduled Meds:  acetaminophen  1,000 mg Oral Q6H   buPROPion  150 mg Oral Daily   DULoxetine  90 mg Oral Daily   enoxaparin (LOVENOX) injection  60 mg Subcutaneous QHS   zolpidem  5 mg Oral QHS   Continuous Infusions:  sodium chloride 10 mL/hr at 07/11/23 1843   0.9 % NaCl with KCl 20 mEq / L 50 mL/hr at 07/11/23 1843   lactated ringers     methocarbamol (ROBAXIN) IV     piperacillin-tazobactam (ZOSYN)  IV 3.375 g (07/12/23 1512)    Time 15  Rhetta Mura, MD  [facility]

## 2023-07-13 DIAGNOSIS — K631 Perforation of intestine (nontraumatic): Secondary | ICD-10-CM | POA: Diagnosis not present

## 2023-07-13 LAB — CBC
HCT: 30.6 % — ABNORMAL LOW (ref 39.0–52.0)
Hemoglobin: 9.3 g/dL — ABNORMAL LOW (ref 13.0–17.0)
MCH: 25.3 pg — ABNORMAL LOW (ref 26.0–34.0)
MCHC: 30.4 g/dL (ref 30.0–36.0)
MCV: 83.4 fL (ref 80.0–100.0)
Platelets: 382 10*3/uL (ref 150–400)
RBC: 3.67 MIL/uL — ABNORMAL LOW (ref 4.22–5.81)
RDW: 13.3 % (ref 11.5–15.5)
WBC: 7 10*3/uL (ref 4.0–10.5)
nRBC: 0 % (ref 0.0–0.2)

## 2023-07-13 LAB — BASIC METABOLIC PANEL
Anion gap: 10 (ref 5–15)
BUN: 10 mg/dL (ref 6–20)
CO2: 24 mmol/L (ref 22–32)
Calcium: 8.6 mg/dL — ABNORMAL LOW (ref 8.9–10.3)
Chloride: 104 mmol/L (ref 98–111)
Creatinine, Ser: 0.77 mg/dL (ref 0.61–1.24)
GFR, Estimated: >60 mL/min (ref >60)
Glucose, Bld: 91 mg/dL (ref 70–99)
Potassium: 3.7 mmol/L (ref 3.5–5.1)
Sodium: 138 mmol/L (ref 135–145)

## 2023-07-13 MED ORDER — SODIUM CHLORIDE 0.9% FLUSH
3.0000 mL | Freq: Two times a day (BID) | INTRAVENOUS | Status: DC
  Administered 2023-07-13: 3 mL via INTRAVENOUS

## 2023-07-13 MED ORDER — SODIUM CHLORIDE 0.9 % IV SOLN: 250.0000 mL | INTRAVENOUS | Status: DC | PRN

## 2023-07-13 MED ORDER — LACTATED RINGERS IV BOLUS: 1000.0000 mL | Freq: Three times a day (TID) | INTRAVENOUS | Status: DC | PRN

## 2023-07-13 MED ORDER — SODIUM CHLORIDE 0.9% FLUSH: 3.0000 mL | INTRAVENOUS | Status: DC | PRN

## 2023-07-13 NOTE — Progress Notes (Signed)
HOSPITALIST ROUNDING NOTE Cory Gomez ACZ:[id number]  DOB: 1981-07-13  DOA: 07/06/2023  PCP: [name], MD  07/13/2023,2:59 PM   LOS: 7 days      Code Status: Full   From: Home  current Dispo: Home     42 year old white male with known severe anxiety/depression not on meds Admit 07/06/2019 1:24 week intractable abdominal pain 10/10 nature no blood in emesis or in his  He was found in the ER to have perforated sigmoid colitis/diverticulitis with 3 cm pericolonic abscess and sigmoid colon Was seen immediately by general surgery 7/15 subsequently evaluated by IR with 3.3 cm gas containing abscess which was surrounded by bowel and vasculature unable to access via percutaneous approach Patient was supposed to be discharged on 717 had low-grade fever increasing pain 7/18 CT repeat severe sigmoid diverticulitis with continued pericolonic abscess consistent with perforation increased wall thickening and inflammation noted-currently 5.1 X2.8 abscess present compared to 3 cm previously  Plan  Perforated colonic abscess--overall improving Continue Zosyn--wbc  wnl Tolerating Dys diet with plans for graduation Pain control Dilaudid 1 mg every 4 as needed for severe pain--first choice tylenol Saline lock today--have asked that he ambulate the unit  Depression Continue Wellbutrin 150 daily, Cymbalta 90 daily    Discussed with mom and dad at bedside  DVT prophylaxis: Lovenox  Status is: Inpatient Remains inpatient appropriate because:   May require surgery    Subjective:  Tolerating diet--upbeat.  Pain in abd 0/10 No cp  Objective + exam Vitals:   07/12/23 2051 07/13/23 0533 07/13/23 0951 07/13/23 1327  BP: 130/88 (!) 144/90 139/86 (!) 142/92  Pulse: 78 84 83 80  Resp: 18 18 16 16   Temp: 98 F (36.7 C) 98.1 F (36.7 C) 98.1 F (36.7 C) 98.2 F (36.8 C)  TempSrc: Oral Oral Oral Oral  SpO2: 98% 98% 99% 99%  Weight:      Height:       Filed Weights   07/06/23 1103  07/06/23 1112  Weight: 131.1 kg 131.1 kg    Examination:  Anxious EOMI NCAT no icterus no pallor  no wheeze no rales no rhonchi Chest clear no added sound Abdomen obese non tender even to deep palpation  Data Reviewed: reviewed   CBC    Component Value Date/Time   WBC 7.0 07/13/2023 0437   RBC 3.67 (L) 07/13/2023 0437   HGB 9.3 (L) 07/13/2023 0437   HCT 30.6 (L) 07/13/2023 0437   PLT 382 07/13/2023 0437   MCV 83.4 07/13/2023 0437   MCH 25.3 (L) 07/13/2023 0437   MCHC 30.4 07/13/2023 0437   RDW 13.3 07/13/2023 0437   LYMPHSABS 1.2 07/07/2023 0422   MONOABS 0.8 07/07/2023 0422   EOSABS 0.2 07/07/2023 0422   BASOSABS 0.1 07/07/2023 0422      Latest Ref Rng & Units 07/13/2023    4:37 AM 07/12/2023    5:22 AM 07/11/2023    5:11 AM  CMP  Glucose 70 - 99 mg/dL 91  85  109   BUN 6 - 20 mg/dL 10  11  8    Creatinine 0.61 - 1.24 mg/dL 3.23  5.57  3.22   Sodium 135 - 145 mmol/L 138  133  134   Potassium 3.5 - 5.1 mmol/L 3.7  3.8  3.7   Chloride 98 - 111 mmol/L 104  102  103   CO2 22 - 32 mmol/L 24  22  20    Calcium 8.9 - 10.3 mg/dL 8.6  8.5  8.7      Scheduled Meds:  acetaminophen  1,000 mg Oral Q6H   buPROPion  150 mg Oral Daily   DULoxetine  90 mg Oral Daily   enoxaparin (LOVENOX) injection  60 mg Subcutaneous QHS   sodium chloride flush  3 mL Intravenous Q12H   zolpidem  5 mg Oral QHS   Continuous Infusions:  sodium chloride 10 mL/hr at 07/12/23 1659   sodium chloride     lactated ringers     methocarbamol (ROBAXIN) IV     piperacillin-tazobactam (ZOSYN)  IV 3.375 g (07/13/23 1352)    Time 15  Rhetta Mura, MD  [facility]

## 2023-07-13 NOTE — Progress Notes (Signed)
07/13/2023  [name] [id number] 1981/12/08  CARE TEAM: PCP: [name], MD  Outpatient Care Team: Patient Care Team: [name], MD as PCP - General (Family Medicine) Karie Soda, MD as Consulting Physician (General Surgery)  Inpatient Treatment Team: Treatment Team:  Rhetta Mura, MD Ccs, Md, MD Merlyn Albert, MD Redmond Baseman, RN Dollard, Lynford Citizen, RN Mahat, Sarala, RN Erlenmeyer, Raymon Mutton, RN   Problem List:   Principal Problem:   Perforated sigmoid colon [facility]) Active Problems:   Prediabetes   ADD (attention deficit disorder)   GAD (generalized anxiety disorder)   Hypertension   Obesity, Class II, BMI 35-39.9   Abscess of sigmoid colon   * No surgery found *      Assessment [facility] Stay = 7 days)      First episode of complex diverticulitis with abscess not amenable to drainage-   Abscess persistent - slightly smaller.  No safe window for IR drainage.  Pain less NPO & WBC now WNL.  Ileus resolving  Plan:  Tolerating clear liquids.  Advance to dysphagia once off full liquid diet.  If does well soft diet tonight   Continue IV antibiotics.  Agree with piperacillin/tazobactam given complex attack.  If does well today and tolerates solid food, most likely can   -discharge with transition to oral antibiotics (Augmentin x 5d) with close outpatient follow-up.  -colonoscopy and then robotic sigmoid colectomy in 6-8 weeks.   If worse or cannot tolerate PO, then lap washout, probable Hartmann.  The anatomy & physiology of the digestive tract was discussed.  The pathophysiology of perforation was discussed.  Differential diagnosis such as perforated ulcer or colon, etc was discussed.   Natural history risks without surgery such as death was discussed.  I recommended abdominal exploration to diagnose & treat the source of the problem.  Laparoscopic & open techniques were discussed.   Risks such as bleeding, infection, abscess, leak,  reoperation, bowel resection, possible ostomy, injury to other organs, need for repair of tissues / organs, hernia, heart attack, death, and other risks were discussed.   The risks of no intervention will lead to serious problems including death.   I expressed a good likelihood that surgery will address the problem.    Goals of post-operative recovery were discussed as well.  We will work to minimize complications although risks in an emergent setting are high.   Questions were answered.  The patient & spouse express understanding & wish to proceed with surgery if not better this weekend     VTE prophylaxis- SCDs, etc  Mobilize as tolerated to help recovery  -Disposition: TBD       I reviewed nursing notes, hospitalist notes, last 24 h vitals and pain scores, last 48 h intake and output, last 24 h labs and trends, and last 24 h imaging results.  I have reviewed this patient's available data, including medical history, events of note, test results, etc as part of my evaluation.   A significant portion of that time was spent in counseling. Care during the described time interval was provided by me.  This care required moderate level of medical decision making.  07/13/2023    Subjective: (Chief complaint)  Denies pain.  This is the best he is felt.  Tolerated liquids.  Hungry.  Objective:  Vital signs:  Vitals:   07/12/23 0636 07/12/23 1303 07/12/23 2051 07/13/23 0533  BP: (!) 145/85 138/85 130/88 (!) 144/90  Pulse: 89 92 78 84  Resp:  18 18 18 18   Temp: 98.7 F (37.1 C) 98.4 F (36.9 C) 98 F (36.7 C) 98.1 F (36.7 C)  TempSrc: Oral  Oral Oral  SpO2: 98% 98% 98% 98%  Weight:      Height:        Last BM Date : 07/12/23  Intake/Output   Yesterday:  07/19 0701 - 07/20 0700 In: 1560.6 [P.O.:240; I.V.:1199; IV Piggyback:121.6] Out: 500 [Urine:500] This shift:  No intake/output data recorded.  Bowel function:  Flatus: YES  BM:  YES  Drain: (No  drain)   Physical Exam:  General: Pt awake/alert in no acute distress.  Being on phone.  Sitting up calm and relaxed and smiling. Eyes: PERRL, normal EOM.  Sclera clear.  No icterus Neuro: CN II-XII intact w/o focal sensory/motor deficits. Lymph: No head/neck/groin lymphadenopathy Psych:  No delerium/psychosis/paranoia.  Oriented x 4 HENT: Normocephalic, Mucus membranes moist.  No thrush Neck: Supple, No tracheal deviation.  No obvious thyromegaly Chest: No pain to chest wall compression.  Good respiratory excursion.  No audible wheezing CV:  Pulses intact.  Regular rhythm.  No major extremity edema MS: Normal AROM mjr joints.  No obvious deformity  Abdomen: Soft.  Nondistended.  Nontender.  No evidence of peritonitis.  No incarcerated hernias.  Ext:  No deformity.  No mjr edema.  No cyanosis Skin: No petechiae / purpurea.  No major sores.  Warm and dry    Results:   Cultures: Recent Results (from the past 720 hour(s))  SARS Coronavirus 2 by RT PCR (hospital order, performed in [facility] hospital lab) *cepheid single result test* Anterior Nasal Swab     Status: None   Collection Time: 07/06/23 12:36 PM   Specimen: Anterior Nasal Swab  Result Value Ref Range Status   SARS Coronavirus 2 by RT PCR NEGATIVE NEGATIVE Final    Comment: (NOTE) SARS-CoV-2 target nucleic acids are NOT DETECTED.  The SARS-CoV-2 RNA is generally detectable in upper and lower respiratory specimens during the acute phase of infection. The lowest concentration of SARS-CoV-2 viral copies this assay can detect is 250 copies / mL. A negative result does not preclude SARS-CoV-2 infection and should not be used as the sole basis for treatment or other patient management decisions.  A negative result may occur with improper specimen collection / handling, submission of specimen other than nasopharyngeal swab, presence of viral mutation(s) within the areas targeted by this assay, and inadequate number of  viral copies (<250 copies / mL). A negative result must be combined with clinical observations, patient history, and epidemiological information.  Fact Sheet for Patients:   RoadLapTop.co.za  Fact Sheet for Healthcare Providers: http://kim-miller.com/  This test is not yet approved or  cleared by the [place] FDA and has been authorized for detection and/or diagnosis of SARS-CoV-2 by FDA under an Emergency Use Authorization (EUA).  This EUA will remain in effect (meaning this test can be used) for the duration of the COVID-19 declaration under Section 564(b)(1) of the Act, 21 U.S.C. section 360bbb-3(b)(1), unless the authorization is terminated or revoked sooner.  Performed at [facility], [address] [place], [place] [address]     Labs: Results for orders placed or performed during the hospital encounter of 07/06/23 (from the past 48 hour(s))  CBC     Status: Abnormal   Collection Time: 07/12/23  5:22 AM  Result Value Ref Range   WBC 10.2 4.0 - 10.5 K/uL   RBC 3.84 (L) 4.22 -  5.81 MIL/uL   Hemoglobin 9.9 (L) 13.0 - 17.0 g/dL   HCT 01.6 (L) 01.0 - 93.2 %   MCV 83.3 80.0 - 100.0 fL   MCH 25.8 (L) 26.0 - 34.0 pg   MCHC 30.9 30.0 - 36.0 g/dL   RDW 35.5 73.2 - 20.2 %   Platelets 391 150 - 400 K/uL   nRBC 0.0 0.0 - 0.2 %    Comment: Performed at [facility], [address], [place], [place] [address]  Basic metabolic panel     Status: Abnormal   Collection Time: 07/12/23  5:22 AM  Result Value Ref Range   Sodium 133 (L) 135 - 145 mmol/L   Potassium 3.8 3.5 - 5.1 mmol/L   Chloride 102 98 - 111 mmol/L   CO2 22 22 - 32 mmol/L   Glucose, Bld 85 70 - 99 mg/dL    Comment: Glucose reference range applies only to samples taken after fasting for at least 8 hours.   BUN 11 6 - 20 mg/dL   Creatinine, Ser 6.23 0.61 - 1.24 mg/dL   Calcium 8.5 (L) 8.9 - 10.3 mg/dL   GFR, Estimated >76 >28 mL/min     Comment: (NOTE) Calculated using the CKD-EPI Creatinine Equation (2021)    Anion gap 9 5 - 15    Comment: Performed at [facility], [address] [place], [place] [address]  CBC     Status: Abnormal   Collection Time: 07/13/23  4:37 AM  Result Value Ref Range   WBC 7.0 4.0 - 10.5 K/uL   RBC 3.67 (L) 4.22 - 5.81 MIL/uL   Hemoglobin 9.3 (L) 13.0 - 17.0 g/dL   HCT 61.6 (L) 07.3 - 71.0 %   MCV 83.4 80.0 - 100.0 fL   MCH 25.3 (L) 26.0 - 34.0 pg   MCHC 30.4 30.0 - 36.0 g/dL   RDW 62.6 94.8 - 54.6 %   Platelets 382 150 - 400 K/uL   nRBC 0.0 0.0 - 0.2 %    Comment: Performed at [facility], [address] [place], [place] [address]  Basic metabolic panel     Status: Abnormal   Collection Time: 07/13/23  4:37 AM  Result Value Ref Range   Sodium 138 135 - 145 mmol/L   Potassium 3.7 3.5 - 5.1 mmol/L   Chloride 104 98 - 111 mmol/L   CO2 24 22 - 32 mmol/L   Glucose, Bld 91 70 - 99 mg/dL    Comment: Glucose reference range applies only to samples taken after fasting for at least 8 hours.   BUN 10 6 - 20 mg/dL   Creatinine, Ser 0.09 0.61 - 1.24 mg/dL   Calcium 8.6 (L) 8.9 - 10.3 mg/dL   GFR, Estimated >38 >18 mL/min    Comment: (NOTE) Calculated using the CKD-EPI Creatinine Equation (2021)    Anion gap 10 5 - 15    Comment: Performed at [facility], [address], [place], [place] [address]    Imaging / Studies: CT ABDOMEN PELVIS W CONTRAST  Result Date: 07/11/2023 CLINICAL DATA:  History of perforated sigmoid diverticulitis. EXAM: CT ABDOMEN AND PELVIS WITH CONTRAST TECHNIQUE: Multidetector CT imaging of the abdomen and pelvis was performed using the standard protocol following bolus administration of intravenous contrast. RADIATION DOSE REDUCTION: This exam was performed according to the departmental dose-optimization program which includes automated exposure control, adjustment of the mA and/or kV according to patient size  and/or use  of iterative reconstruction technique. CONTRAST:  OMNIPAQUE IOHEXOL 300 MG/ML  SOLN COMPARISON:  July 06, 2023. FINDINGS: Lower chest: No acute abnormality. Hepatobiliary: No focal liver abnormality is seen. Status post cholecystectomy. No biliary dilatation. Pancreas: Unremarkable. No pancreatic ductal dilatation or surrounding inflammatory changes. Spleen: Normal in size without focal abnormality. Adrenals/Urinary Tract: Adrenal glands are unremarkable. Kidneys are normal, without renal calculi, focal lesion, or hydronephrosis. Bladder is unremarkable. Stomach/Bowel: The stomach and appendix are unremarkable. There is no evidence of bowel obstruction. Severe inflammatory changes are again noted involving the sigmoid colon consistent with a history of perforated diverticulitis. 5.1 x 2.8 cm pericolonic abscess is noted again. Vascular/Lymphatic: No significant vascular findings are present. No enlarged abdominal or pelvic lymph nodes. Reproductive: Prostate is unremarkable. Other: No hernia is noted. Musculoskeletal: No acute or significant osseous findings. IMPRESSION: Severe sigmoid diverticulitis is again noted with continued presence of pericolonic abscess consistent with perforation as noted on prior study. There does appear to be increased wall thickening and inflammation involving the more distal sigmoid colon compared to prior exam. Electronically Signed   By: Lupita Raider M.D.   On: 07/11/2023 13:41    Medications / Allergies: per chart  Antibiotics: Anti-infectives (From admission, onward)    Start     Dose/Rate Route Frequency Ordered Stop   07/09/23 0000  amoxicillin-clavulanate (AUGMENTIN) 875-125 MG tablet        1 tablet Oral 2 times daily 07/09/23 0854 07/14/23 2359   07/06/23 2200  piperacillin-tazobactam (ZOSYN) IVPB 3.375 g        3.375 g 12.5 mL/hr over 240 Minutes Intravenous Every 8 hours 07/06/23 1851 07/16/23 2159   07/06/23 1530  piperacillin-tazobactam  (ZOSYN) IVPB 3.375 g        3.375 g 100 mL/hr over 30 Minutes Intravenous  Once 07/06/23 1521 07/06/23 1555         Note: Portions of this report may have been transcribed using voice recognition software. Every effort was made to ensure accuracy; however, inadvertent computerized transcription errors may be present.   Any transcriptional errors that result from this process are unintentional.    Ardeth Sportsman, MD, FACS, MASCRS Esophageal, Gastrointestinal & Colorectal Surgery Robotic and Minimally Invasive Surgery  [facility] [address] [place], [place] [address] [phone] Fax [phone] Main  CONTACT INFORMATION:  Weekday (9AM-5PM): Call [facility] main office at [phone]  Weeknight (5PM-9AM) or Weekend/Holiday: Check [email or web address] (password " [email or web address]") for General Surgery [facility] coverage  (Please, do not use SecureChat as it is not reliable communication to reach operating surgeons for immediate patient care given surgeries/outpatient duties/clinic/cross-coverage/off post-call which would lead to a delay in care.  Epic staff messaging available for outptient concerns, but may not be answered for 48 hours or more).     07/13/2023  8:08 AM

## 2023-07-14 ENCOUNTER — Encounter: Payer: Self-pay | Admitting: Family Medicine

## 2023-07-14 NOTE — Discharge Summary (Signed)
Physician Discharge Summary  Patient ID: Macdonald Razzak MRN: [id number] DOB/AGE: 40-14-1982 40 y.o.  Admit date: 07/06/2023 Discharge date: 07/14/2023  Admission Diagnoses:  Acute sigmoid diverticulitis with perforation and pericolonic abscess not amenable to IR drainage  Discharge Diagnoses: Same Principal Problem:   Perforated sigmoid colon (HCC) Active Problems:   Prediabetes   ADD (attention deficit disorder)   GAD (generalized anxiety disorder)   Hypertension   Obesity, Class II, BMI 35-39.9   Abscess of sigmoid colon   Discharged Condition: improved  Hospital Course: 42 year old male with anxiety depression Admitted from the emergency room 7/13 with nausea vomiting-thought it was a muscle sprain he awoke on the morning of 7/13 with fever and 10/10 pain   CT abdomen pelvis showed perforated sigmoid diverticulitis with 3 cm pericolonic mass.  General surgery was consulted and followed and recommended IR to see placed on IV antibiotics.  Acute diverticulitis with 3 cm pericolonic abscess Patient was initially on Zosyn transition to Augmentin at discharge to complete 5 more days IR saw the patient on 7/15 unfortunately this area was not amenable to IR drain Patient will need to consider elective partial colectomy given his young age and high risk for recurrence  Consults: general surgery and interventional radiology  Significant Diagnostic Studies:  CLINICAL DATA:  History of perforated sigmoid diverticulitis.   EXAM: CT ABDOMEN AND PELVIS WITH CONTRAST   TECHNIQUE: Multidetector CT imaging of the abdomen and pelvis was performed using the standard protocol following bolus administration of intravenous contrast.   RADIATION DOSE REDUCTION: This exam was performed according to the departmental dose-optimization program which includes automated exposure control, adjustment of the mA and/or kV according to patient size and/or use of iterative reconstruction  technique.   CONTRAST:  OMNIPAQUE IOHEXOL 300 MG/ML  SOLN   COMPARISON:  July 06, 2023.   FINDINGS: Lower chest: No acute abnormality.   Hepatobiliary: No focal liver abnormality is seen. Status post cholecystectomy. No biliary dilatation.   Pancreas: Unremarkable. No pancreatic ductal dilatation or surrounding inflammatory changes.   Spleen: Normal in size without focal abnormality.   Adrenals/Urinary Tract: Adrenal glands are unremarkable. Kidneys are normal, without renal calculi, focal lesion, or hydronephrosis. Bladder is unremarkable.   Stomach/Bowel: The stomach and appendix are unremarkable. There is no evidence of bowel obstruction. Severe inflammatory changes are again noted involving the sigmoid colon consistent with a history of perforated diverticulitis. 5.1 x 2.8 cm pericolonic abscess is noted again.   Vascular/Lymphatic: No significant vascular findings are present. No enlarged abdominal or pelvic lymph nodes.   Reproductive: Prostate is unremarkable.   Other: No hernia is noted.   Musculoskeletal: No acute or significant osseous findings.   IMPRESSION: Severe sigmoid diverticulitis is again noted with continued presence of pericolonic abscess consistent with perforation as noted on prior study. There does appear to be increased wall thickening and inflammation involving the more distal sigmoid colon compared to prior exam.     Electronically Signed   By: Lupita Raider M.D.   On: 07/11/2023 13:41   Treatments: antibiotics: Zosyn  Discharge Exam: Blood pressure (!) 143/86, pulse 88, temperature 98.8 F (37.1 C), temperature source Oral, resp. rate 18, height 6\' 2"  (1.88 m), weight 131.1 kg, SpO2 97%. Abd - soft, non-distended, non-tender, obese  Disposition: Discharge disposition: 01-Home or Self Care       Discharge Instructions     Call MD for:   Complete by: As directed    FEVER > 101.5 F  (  temperatures < 101.5 F are not  significant)   Call MD for:  extreme fatigue   Complete by: As directed    Call MD for:  persistant dizziness or light-headedness   Complete by: As directed    Call MD for:  persistant nausea and vomiting   Complete by: As directed    Call MD for:  persistant nausea and vomiting   Complete by: As directed    Call MD for:  redness, tenderness, or signs of infection (pain, swelling, redness, odor or green/yellow discharge around incision site)   Complete by: As directed    Call MD for:  redness, tenderness, or signs of infection (pain, swelling, redness, odor or green/yellow discharge around incision site)   Complete by: As directed    Call MD for:  severe uncontrolled pain   Complete by: As directed    Call MD for:  severe uncontrolled pain   Complete by: As directed    Call MD for:  temperature >100.4   Complete by: As directed    Diet - low sodium heart healthy   Complete by: As directed    Diet general   Complete by: As directed    Discharge instructions   Complete by: As directed    See Discharge Instructions If you are not getting better after two weeks or are noticing you are getting worse, contact our office [phone] for further advice.  We may need to adjust your medications, re-evaluate you in the office, send you to the emergency room, or see what other things we can do to help. The clinic staff is available to answer your questions during regular business hours (8:30am-5pm).  Please don't hesitate to call and ask to speak to one of our nurses for clinical concerns.    A surgeon from [facility] is always on call at the hospitals 24 hours/day If you have a medical emergency, go to the nearest emergency room or call 911.   Discharge instructions   Complete by: As directed    Please complete all antibiotics and follow-up with Dr. Michaell Cowing Follow-up with your regular physician as well as your psychiatrist for routine labs High fevers chills nausea vomiting any  other symptoms or increasing left lower quadrant pain you need to be evaluated in the emergency room   Driving Restrictions   Complete by: As directed    You may drive when: - you are no longer taking narcotic prescription pain medication - you can comfortably wear a seatbelt - you can safely make sudden turns/stops without pain.   Driving Restrictions   Complete by: As directed    Do not drive while taking pain medications   Increase activity slowly   Complete by: As directed    Start light daily activities --- self-care, walking, climbing stairs- beginning the day after surgery.  Gradually increase activities as tolerated.  Control your pain to be active.  Stop when you are tired.  Ideally, walk several times a day, eventually an hour a day.   Most people are back to most day-to-day activities in a few weeks.  It takes 4-6 weeks to get back to unrestricted, intense activity. If you can walk 30 minutes without difficulty, it is safe to try more intense activity such as jogging, treadmill, bicycling, low-impact aerobics, swimming, etc. Save the most intensive and strenuous activity for last (Usually 4-8 weeks after surgery) such as sit-ups, heavy lifting, contact sports, etc.  Refrain from any intense heavy lifting  or straining until you are off narcotics for pain control.  You will have off days, but things should improve week-by-week. DO NOT PUSH THROUGH PAIN.  Let pain be your guide: If it hurts to do something, don't do it.   Increase activity slowly   Complete by: As directed    Increase activity slowly   Complete by: As directed    Lifting restrictions   Complete by: As directed    If you can walk 30 minutes without difficulty, it is safe to try more intense activity such as jogging, treadmill, bicycling, low-impact aerobics, swimming, etc. Save the most intensive and strenuous activity for last (Usually 4-8 weeks after surgery) such as sit-ups, heavy lifting, contact sports, etc.    Refrain from any intense heavy lifting or straining until you are off narcotics for pain control.  You will have off days, but things should improve week-by-week. DO NOT PUSH THROUGH PAIN.  Let pain be your guide: If it hurts to do something, don't do it.  Pain is your body warning you to avoid that activity for another week until the pain goes down.   May shower / Bathe   Complete by: As directed    May shower / Bathe   Complete by: As directed    May walk up steps   Complete by: As directed    Sexual Activity Restrictions   Complete by: As directed    You may have sexual intercourse when it is comfortable. If it hurts to do something, stop.      Allergies as of 07/14/2023   No Known Allergies      Medication List     TAKE these medications    acetaminophen 500 MG tablet Commonly known as: TYLENOL Take 500-1,000 mg by mouth every 6 (six) hours as needed for mild pain or headache.   amoxicillin-clavulanate 875-125 MG tablet Commonly known as: AUGMENTIN Take 1 tablet by mouth 2 (two) times daily for 5 days.   buPROPion 150 MG 24 hr tablet Commonly known as: Wellbutrin XL Take 1 tablet (150 mg total) by mouth every morning.   DULoxetine 30 MG capsule Commonly known as: Cymbalta Take 3 capsules (90 mg total) by mouth daily.   eszopiclone 2 MG Tabs tablet Commonly known as: Lunesta Take 1 tablet (2 mg total) by mouth at bedtime as needed for sleep. Take immediately before bedtime What changed: when to take this   Loratadine 5 MG Tbdp Take 1 tablet (5 mg total) by mouth daily. What changed:  when to take this reasons to take this   meclizine 25 MG tablet Commonly known as: ANTIVERT Take 25 mg by mouth 3 (three) times daily as needed for dizziness.   Vitamin D (Ergocalciferol) 1.25 MG (50000 UNIT) Caps capsule Commonly known as: DRISDOL Take 1 capsule (50,000 Units total) by mouth every 7 (seven) days. What changed: when to take this        Follow-up  Information     Karie Soda, MD Follow up in 3 week(s).   Specialties: General Surgery, Colon and Rectal Surgery Why: To follow up after your hospital stay Contact information: [address] [place] [address] [phone]                 Signed: Wynona Luna 07/14/2023, 9:17 AM

## 2023-07-14 NOTE — Progress Notes (Signed)
Appreciative of General surgery input and collegiality--looks like he is D/c by Dr. Harlon Flor for close follow up with them  No charge  Pleas Koch, MD [facility] 9:25 AM

## 2023-07-15 ENCOUNTER — Encounter: Payer: Self-pay | Admitting: Internal Medicine

## 2023-07-15 ENCOUNTER — Telehealth: Payer: Self-pay

## 2023-07-15 NOTE — Transitions of Care (Post Inpatient/ED Visit) (Signed)
07/15/2023  Name: Cory Gomez MRN: [id number] DOB: [birth year]  Today's TOC FU Call Status: Today's TOC FU Call Status:: Successful TOC FU Call Competed TOC FU Call Complete Date: 07/15/23  Transition Care Management Follow-up Telephone Call Discharge Facility: [facility]) Type of Discharge: Inpatient Admission Primary Inpatient Discharge Diagnosis:: diverticulitis How have you been since you were released from the hospital?: Better Any questions or concerns?: No  Items Reviewed: Did you receive and understand the discharge instructions provided?: Yes Medications obtained,verified, and reconciled?: Yes (Medications Reviewed) Any new allergies since your discharge?: No Dietary orders reviewed?: Yes Do you have support at home?: Yes People in Home: spouse  Medications Reviewed Today: Medications Reviewed Today     Reviewed by Karena Addison, LPN (Licensed Practical Nurse) on 07/15/23 at 1551  Med List Status: <None>   Medication Order Taking? Sig Documenting Provider Last Dose Status Informant  acetaminophen (TYLENOL) 500 MG tablet [id number] No Take 500-1,000 mg by mouth every 6 (six) hours as needed for mild pain or headache. [provider] unk Active Self  buPROPion (WELLBUTRIN XL) 150 MG 24 hr tablet [id number] No Take 1 tablet (150 mg total) by mouth every morning. Stasia Cavalier, MD 07/05/2023 am Active Self  DULoxetine (CYMBALTA) 30 MG capsule [id number] No Take 3 capsules (90 mg total) by mouth daily. Stasia Cavalier, MD 07/05/2023 am Active Self  eszopiclone (LUNESTA) 2 MG TABS tablet [id number] No Take 1 tablet (2 mg total) by mouth at bedtime as needed for sleep. Take immediately before bedtime  Patient taking differently: Take 2 mg by mouth at bedtime. Take immediately before bedtime   Stasia Cavalier, MD 07/04/2023 pm Active Self  Loratadine 5 MG TBDP [id number] No Take 1 tablet (5 mg total) by mouth daily.  Patient taking differently: Take 5 mg by  mouth daily as needed (for allergies or sinus issues).   [name], MD unk Active Self  meclizine (ANTIVERT) 25 MG tablet [id number] No Take 25 mg by mouth 3 (three) times daily as needed for dizziness. [provider] unk Active Self  Vitamin D, Ergocalciferol, (DRISDOL) 1.25 MG (50000 UNIT) CAPS capsule [id number] No Take 1 capsule (50,000 Units total) by mouth every 7 (seven) days.  Patient taking differently: Take 50,000 Units by mouth every Saturday.   [name], MD 06/29/2023 Active Self            Home Care and Equipment/Supplies: Were Home Health Services Ordered?: NA Any new equipment or medical supplies ordered?: NA  Functional Questionnaire: Do you need assistance with bathing/showering or dressing?: No Do you need assistance with meal preparation?: No Do you need assistance with eating?: No Do you have difficulty maintaining continence: No Do you need assistance with getting out of bed/getting out of a chair/moving?: No Do you have difficulty managing or taking your medications?: No  Follow up appointments reviewed: PCP Follow-up appointment confirmed?: Yes Date of PCP follow-up appointment?: 07/18/23 Follow-up Provider: [facility] Follow-up appointment confirmed?: Yes Date of Specialist follow-up appointment?: 09/19/23 Follow-Up Specialty Provider:: GI Do you need transportation to your follow-up appointment?: No Do you understand care options if your condition(s) worsen?: Yes-patient verbalized understanding    SIGNATURE Karena Addison, LPN [facility] Health Advisor Direct Dial [phone]

## 2023-07-18 ENCOUNTER — Ambulatory Visit: Payer: BC Managed Care – PPO | Admitting: Family Medicine

## 2023-07-18 ENCOUNTER — Encounter: Payer: Self-pay | Admitting: Family Medicine

## 2023-07-18 VITALS — BP 136/82 | HR 80 | Temp 98.2°F | Wt 274.0 lb

## 2023-07-18 DIAGNOSIS — K630 Abscess of intestine: Secondary | ICD-10-CM

## 2023-07-18 DIAGNOSIS — R1032 Left lower quadrant pain: Secondary | ICD-10-CM

## 2023-07-18 DIAGNOSIS — K631 Perforation of intestine (nontraumatic): Secondary | ICD-10-CM

## 2023-07-18 LAB — CBC
HCT: 36.1 % — ABNORMAL LOW (ref 39.0–52.0)
Hemoglobin: 11.6 g/dL — ABNORMAL LOW (ref 13.0–17.0)
MCHC: 32 g/dL (ref 30.0–36.0)
MCV: 79.3 fl (ref 78.0–100.0)
Platelets: 589 10*3/uL — ABNORMAL HIGH (ref 150.0–400.0)
RBC: 4.55 Mil/uL (ref 4.22–5.81)
RDW: 14.6 % (ref 11.5–15.5)
WBC: 8 10*3/uL (ref 4.0–10.5)

## 2023-07-18 NOTE — Progress Notes (Signed)
SUBJECTIVE:   Chief Complaint  Patient presents with   Follow-up    Still having pain tx with tylenol with no relief. dx: diverticulitis    HPI Presents for acute visit  Recently admitted to [facility] from 07/13-07/17 for acute diverticulitis with pericolonic abscess.  CT sowed perforated sigmoid colitis or diverticulitis with 3 cm pericolonic abscess in sigmoid mesocolon. Abscess unable to be drained with IR.  Was treated with 10 days IV antibiotics and discharged home with po antibiotics and follow up with outpatient surgery closely.    Today he present to clinic for worsening abdominal pain and subjective fevers.  Has been taking tylenol for pain as instructed from discharge.  He continues to take antibiotics as prescribed by surgery at discharge. He has follow up appointment with Dr Michaell Cowing in 3 weeks.  Denies any nausea, vomiting, diarrhea or constipation.  Pain is now 6/10.  PERTINENT PMH / PSH: Mood disorder   OBJECTIVE:  BP 136/82 (BP Location: Right Arm, Patient Position: Sitting, Cuff Size: Large)   Pulse 80   Temp 98.2 F (36.8 C) (Oral)   Wt 274 lb (124.3 kg)   SpO2 99%   BMI 35.18 kg/m    Physical Exam Vitals reviewed.  Constitutional:      General: He is not in acute distress.    Appearance: Normal appearance. He is normal weight. He is not ill-appearing, toxic-appearing or diaphoretic.  Eyes:     General:        Right eye: No discharge.        Left eye: No discharge.  Cardiovascular:     Rate and Rhythm: Normal rate and regular rhythm.     Heart sounds: Normal heart sounds.  Pulmonary:     Effort: Pulmonary effort is normal.     Breath sounds: Normal breath sounds.  Abdominal:     General: Bowel sounds are normal.     Tenderness: There is abdominal tenderness in the left lower quadrant. There is no right CVA tenderness or guarding. Negative signs include Murphy's sign.     Hernia: No hernia is present.  Musculoskeletal:        General:  Normal range of motion.     Cervical back: Normal range of motion.  Skin:    General: Skin is warm and dry.  Neurological:     Mental Status: He is alert and oriented to person, place, and time. Mental status is at baseline.  Psychiatric:        Mood and Affect: Mood normal.        Behavior: Behavior normal.        Thought Content: Thought content normal.        Judgment: Judgment normal.        07/18/2023   11:16 AM 05/29/2023   11:37 AM 03/12/2023   10:53 AM 01/15/2023    1:28 PM 12/03/2022    9:42 AM  Depression screen PHQ 2/9  Decreased Interest 1 1 1  2   Down, Depressed, Hopeless 1 1 1  3   PHQ - 2 Score 2 2 2  5   Altered sleeping 1 2 0  3  Tired, decreased energy 1 2 1  3   Change in appetite 0 0 1  2  Feeling bad or failure about yourself  1 1 0  3  Trouble concentrating 1 2 1  3   Moving slowly or fidgety/restless 1 1 0  2  Suicidal thoughts 0  0  2  PHQ-9 Score 7 10 5  23   Difficult doing work/chores Somewhat difficult Somewhat difficult Somewhat difficult  Extremely dIfficult     Information is confidential and restricted. Go to Review Flowsheets to unlock data.      07/18/2023   11:16 AM 05/29/2023   11:38 AM 03/12/2023   10:53 AM 01/15/2023    1:30 PM  GAD 7 : Generalized Anxiety Score  Nervous, Anxious, on Edge 2 1 2    Control/stop worrying 2 1 1    Worry too much - different things 2 0 1   Trouble relaxing 2 1 0   Restless 1 1 0   Easily annoyed or irritable 2 2 2    Afraid - awful might happen 2 0 2   Total GAD 7 Score 13 6 8    Anxiety Difficulty Somewhat difficult Somewhat difficult Somewhat difficult      Information is confidential and restricted. Go to Review Flowsheets to unlock data.    ASSESSMENT/PLAN:  Left lower quadrant abdominal pain Assessment & Plan: Hospital follow up for acute diverticulitis.  Hemodynamically stable. Continues to have pain that is now worsening with subjective fevers On antibiotics Check CBC today Recommend patient call  surgery for further recommendations, if unable to reach office recommend he return to ED for further evaluation given increased pain and subjective fevers  Orders: -     CBC  Perforated sigmoid colon (HCC) Assessment & Plan: Recent diagnosis Continues to have pain despite antibiotics Recommend he call surgeons office for sooner evaluation   Abscess of sigmoid colon Assessment & Plan: Recently diagnosed Continue Augmentin BID Call surgery for sooner evaluation If unable to reach recommend her go to ED for further evaluation   Note provided for work.  Sent through [employer]  PDMP reviewed  Return if symptoms worsen or fail to improve, for PCP.  [name], MD

## 2023-07-18 NOTE — Patient Instructions (Signed)
It was a pleasure meeting you today. Thank you for allowing me to take part in your health care.  Our goals for today as we discussed include:  Call General Surgery to notify Dr Michaell Cowing of worsening pain  If develops fevers go to emergency  Will reach out to you this evening to confirm if antibiotics need to be changed   If you have any questions or concerns, please do not hesitate to call the office at [phone].  I look forward to our next visit and until then take care and stay safe.  Regards,   [name], MD   [facility]

## 2023-07-19 ENCOUNTER — Telehealth: Payer: Self-pay

## 2023-07-19 NOTE — Telephone Encounter (Signed)
Dr Karie Soda has scheduled the patient for colon surgery 09/25/23. Cory Gomez is calling to coordinate the colonoscopy for the patient on 09/24/23. Patient of Dr Rhea Belton. Please return call to [name] with outcome.

## 2023-07-22 ENCOUNTER — Other Ambulatory Visit: Payer: Self-pay

## 2023-07-22 ENCOUNTER — Other Ambulatory Visit: Payer: Self-pay | Admitting: Urology

## 2023-07-22 DIAGNOSIS — K630 Abscess of intestine: Secondary | ICD-10-CM

## 2023-07-22 DIAGNOSIS — K631 Perforation of intestine (nontraumatic): Secondary | ICD-10-CM

## 2023-07-22 NOTE — Telephone Encounter (Signed)
Pt scheduled for colon with Dr. Marina Goodell in the [facility] 09/24/23, Morrie Sheldon with [facility] aware. Pt to keep his OV as scheduled and Dr. Rhea Belton will make sure pt is appropriate for the [facility]. Amb ref in epic for colon.

## 2023-07-22 NOTE — Telephone Encounter (Signed)
Please see note below. Dr. Rhea Belton you are not doing procedures on 09/24/23. Dr. Marina Goodell has some slots available. Please advise. Pt is scheduled for an OV on 9/26 with you.

## 2023-07-22 NOTE — Telephone Encounter (Signed)
Please place him in a slot for dr. Marina Goodell and I will see him as scheduled in Sept I will have to determine appropriateness of [facility] colonoscopy on day of office visit and then Dr. Marina Goodell would have to ensure he agrees

## 2023-07-23 ENCOUNTER — Other Ambulatory Visit: Payer: Self-pay

## 2023-07-23 ENCOUNTER — Encounter (HOSPITAL_COMMUNITY): Payer: Self-pay | Admitting: Psychiatry

## 2023-07-23 ENCOUNTER — Telehealth (HOSPITAL_BASED_OUTPATIENT_CLINIC_OR_DEPARTMENT_OTHER): Payer: BC Managed Care – PPO | Admitting: Psychiatry

## 2023-07-23 DIAGNOSIS — F411 Generalized anxiety disorder: Secondary | ICD-10-CM | POA: Diagnosis not present

## 2023-07-23 DIAGNOSIS — F330 Major depressive disorder, recurrent, mild: Secondary | ICD-10-CM | POA: Diagnosis not present

## 2023-07-23 DIAGNOSIS — F5105 Insomnia due to other mental disorder: Secondary | ICD-10-CM

## 2023-07-23 MED ORDER — ESZOPICLONE 2 MG PO TABS
2.0000 mg | ORAL_TABLET | Freq: Every evening | ORAL | 2 refills | Status: DC | PRN
Start: 2023-07-23 — End: 2024-01-07
  Filled 2023-07-23: qty 30, 30d supply, fill #0
  Filled 2023-09-11: qty 30, 30d supply, fill #1

## 2023-07-23 MED ORDER — BUPROPION HCL ER (XL) 150 MG PO TB24
150.0000 mg | ORAL_TABLET | ORAL | 2 refills | Status: DC
Start: 2023-07-23 — End: 2023-10-03
  Filled 2023-07-23: qty 30, 30d supply, fill #0
  Filled 2023-09-11: qty 30, 30d supply, fill #1

## 2023-07-23 MED ORDER — DULOXETINE HCL 30 MG PO CPEP
90.0000 mg | ORAL_CAPSULE | Freq: Every day | ORAL | 2 refills | Status: DC
Start: 2023-07-23 — End: 2023-10-03
  Filled 2023-07-23: qty 90, 30d supply, fill #0
  Filled 2023-09-11: qty 90, 30d supply, fill #1

## 2023-07-23 MED ORDER — ESZOPICLONE 2 MG PO TABS
2.0000 mg | ORAL_TABLET | Freq: Every evening | ORAL | 2 refills | Status: DC | PRN
Start: 2023-07-23 — End: 2023-07-23

## 2023-07-23 NOTE — Progress Notes (Signed)
BH MD/PA/NP OP Progress Note  07/23/2023 3:16 PM Cory Gomez  MRN:  [id number]  Visit Diagnosis:    ICD-10-CM   1. Major depressive disorder, recurrent episode, mild (HCC)  F33.0 buPROPion (WELLBUTRIN XL) 150 MG 24 hr tablet    DULoxetine (CYMBALTA) 30 MG capsule    2. GAD (generalized anxiety disorder)  F41.1 DULoxetine (CYMBALTA) 30 MG capsule    3. Insomnia due to mental disorder  F51.05 eszopiclone (LUNESTA) 2 MG TABS tablet       [name] is a 42 y.o. male with a history of MDD, GAD, ADHD, binge eating disorder, and Vit D deficiency who presented to [facility] for initial evaluation on 01/15/2023.  At initial evaluation patient reported neurovegetative symptoms of depression including low mood, anhedonia, amotivation, worthlessness, insomnia, increased appetite, and occasional passive SI.  He denied any intent or plan.  Patient was able to complete a safety plan and lists his mother as a good support.  Patient furthermore endorsed symptoms of anxiety including feeling nervous or on edge, being unable to stop or control his worrying, difficulty relaxing, increased irritability, restlessness, and fears that something awful might happen.  Psychosocially patient has increased stress from financial struggles, difficulty at work, and interpersonal struggles in his marriage.  Patient meets criteria for MDD and generalized anxiety disorder with further clarity needed to rule out ADHD.  He was referred for neuropsych testing.  Of note due to patient's sleep troubles and body habitus there is some concern for sleep apnea and he was referred to a sleep specialist.  Cory Gomez presents for follow-up evaluation. Today, 07/23/23, patient reports that his mood, anxiety, and insomnia are well controlled.  There was a dip in mood in the interim secondary to an emergency hospitalization for diverticulitis however patient's mood has  improved since discharge.  He is remaining upbeat and keeping a positive outlook on things.  Patient has had increased the frequency of Lunesta back to every night since his hospitalization however is setting a goal to decrease it back down to as needed in the future.  Has tolerated the remainder of medication regimen well and denies any adverse side effects. Will continue on his current regimen and follow-up in 2 months.  Plan: - Continue Cymbalta 90 mg QD - Continue Wellbutrin XL 150 mg QD - Continue Lunesta 2 mg QHS as needed for insomnia - Neuropsych referral - Sleep specialist appointment upcoming  - Continue therapy with Ambrose Mantle twice a month - Crisis resources reviewed - Follow up in 2 months  Chief Complaint:  Chief Complaint  Patient presents with   Follow-up   HPI: Cory Gomez presents reporting that there was a bit of damper on his mood as he spent 10 days in the hospital diverticulitis. It was a rough time and now he is scheduled for surgery for it in October to repair the damage to his colon.  While in the hospital he had to take Ambien send Lunesta for sleep and notes that there was a bit of a decline.  Things are improving now though due to the pain he has started taking Lunesta every night again.  Prior to this Cory Gomez had only been needing the medication every other night.  He does have a goal of getting back to that schedule as his pain symptoms improved.  Since being discharged from the hospital his mood has been improving and he is staying positive.  Patient  is using rumor as a defense mechanism.  He does not want to let this incident push him back to where he was prior to starting medications and therapy.  At home things are about the same, but he decided that he is going to focus on himself, for trying to change anyone else.  He does see the relationship his wife improving in the future.  She has been a good support during his recovery.  Things have also been tight  financially since he has been out of work but she has been working hard to help provide.  He has also just started short-term disability today and is scheduled to go till 8/17 at the shortest before returning to work.  Patient would like to continue on his current medication regimen as he feels working well and he denies any adverse side effects.  Past Psychiatric History: First episode of depression was in early elementary school after his parents divorced. He was in counseling and prescribed bupropion then. Later, as an adult, he was briefly on fluoxetine with no clear benefit. While in school he was diagnosed with having ADHD inattentive type and was on Ritalin. He had seen Dr. Hinton Dyer from 2020-2022. He has no hx of inpatient psychiatric admissions, no hx of mania or overt psychosis. Denies any prior suicide attempts.   Has tried Adderall, Vyvanse, Ritalin, bupropion, Prozac, Zoloft, Cymbalta, trazodone, Zolpidem, topirimate, Lunesta   He does not abuse alcohol or street drugs.  Past Medical History:  Past Medical History:  Diagnosis Date   ADHD (attention deficit hyperactivity disorder)    Annual physical exam 09/09/2019   Anxiety and depression    COVID-19    12/28/19   Depression, recurrent (HCC) 09/09/2019   Elevated liver enzymes 06/11/2019   Excessive ear wax, left 09/09/2019   Headache 12/31/2021   Hypertriglyceridemia 07/17/2021   Insomnia    Insomnia    Inverted nipple 09/16/2019   Kidney stone    Mastoiditis of both sides    Nystagmus 01/22/2022   Obesity (BMI 30-39.9)    Obesity (BMI 30-39.9)    Prediabetes    Suicidal ideation 07/14/2021   Vertigo     Past Surgical History:  Procedure Laterality Date   NO PAST SURGERIES      Family History:  Family History  Problem Relation Age of Onset   Diabetes Mother    Hypertension Mother    Hyperlipidemia Mother    Diabetes Mellitus II Mother    Anxiety disorder Mother    Depression Mother    Stroke Father     Alcoholism Father    Anxiety disorder Brother    Depression Brother    Hypertension Brother    Hyperlipidemia Brother    Diabetes type II Brother    ADD / ADHD Brother    Dementia Maternal Grandfather     Social History:  Social History   Socioeconomic History   Marital status: Married    Spouse name: Not on file   Number of children: Not on file   Years of education: Not on file   Highest education level: Not on file  Occupational History   Not on file  Tobacco Use   Smoking status: Never   Smokeless tobacco: Never  Vaping Use   Vaping status: Never Used  Substance and Sexual Activity   Alcohol use: Not Currently   Drug use: Not Currently   Sexual activity: Yes    Partners: Female  Other Topics Concern   Not on  file  Social History Narrative   Married no kids wife is Taygen Robledo    -wife and mother [name] on [place]      Has siblings    Works Educational psychologist IT [employer]   1 dog    Social Determinants of Corporate investment banker Strain: Not on file  Food Insecurity: No Food Insecurity (07/06/2023)   Hunger Vital Sign    Worried About Running Out of Food in the Last Year: Never true    Ran Out of Food in the Last Year: Never true  Transportation Needs: No Transportation Needs (07/06/2023)   PRAPARE - Administrator, Civil Service (Medical): No    Lack of Transportation (Non-Medical): No  Physical Activity: Not on file  Stress: Not on file  Social Connections: Not on file    Allergies: No Known Allergies  Current Medications: Current Outpatient Medications  Medication Sig Dispense Refill   acetaminophen (TYLENOL) 500 MG tablet Take 500-1,000 mg by mouth every 6 (six) hours as needed for mild pain or headache.     buPROPion (WELLBUTRIN XL) 150 MG 24 hr tablet Take 1 tablet (150 mg total) by mouth every morning. 30 tablet 2   DULoxetine (CYMBALTA) 30 MG capsule Take 3 capsules (90 mg total) by mouth daily. 90 capsule 2   eszopiclone (LUNESTA)  2 MG TABS tablet Take 1 tablet (2 mg total) by mouth at bedtime as needed for sleep. Take immediately before bedtime 30 tablet 2   Loratadine 5 MG TBDP Take 1 tablet (5 mg total) by mouth daily. (Patient taking differently: Take 5 mg by mouth daily as needed (for allergies or sinus issues).) 60 tablet 1   meclizine (ANTIVERT) 25 MG tablet Take 25 mg by mouth 3 (three) times daily as needed for dizziness.     Vitamin D, Ergocalciferol, (DRISDOL) 1.25 MG (50000 UNIT) CAPS capsule Take 1 capsule (50,000 Units total) by mouth every 7 (seven) days. (Patient taking differently: Take 50,000 Units by mouth every Saturday.) 12 capsule 1   No current facility-administered medications for this visit.     Psychiatric Specialty Exam: Review of Systems  There were no vitals taken for this visit.There is no height or weight on file to calculate BMI.  General Appearance: Well Groomed  Eye Contact:  Good  Speech:  Clear and Coherent and Normal Rate  Volume:  Normal  Mood:  Euthymic  Affect:  Congruent  Thought Process:  Coherent and Goal Directed  Orientation:  Full (Time, Place, and Person)  Thought Content: Logical   Suicidal Thoughts:  No  Homicidal Thoughts:  No  Memory:  NA  Judgement:  Fair  Insight:  Fair  Psychomotor Activity:  Normal  Concentration:  Concentration: Good  Recall:  Good  Fund of Knowledge: Fair  Language: Good  Akathisia:  NA    AIMS (if indicated): not done  Assets:  Passenger transport manager Vocational/Educational  ADL's:  Intact  Cognition: WNL  Sleep:  Good   Metabolic Disorder Labs: Lab Results  Component Value Date   HGBA1C 5.8 05/08/2023   No results found for: "PROLACTIN" Lab Results  Component Value Date   CHOL 187 05/08/2023   TRIG 178.0 (H) 05/08/2023   HDL 33.80 (L) 05/08/2023   CHOLHDL 6 05/08/2023   VLDL 35.6 05/08/2023   LDLCALC 117 (H) 05/08/2023   LDLCALC 102 (H) 06/11/2019   Lab Results  Component  Value Date   TSH 3.16  05/08/2023   TSH 2.91 06/14/2021    Therapeutic Level Labs: No results found for: "LITHIUM" No results found for: "VALPROATE" No results found for: "CBMZ"   Screenings: GAD-7    Flowsheet Row Office Visit from 07/18/2023 in [facility] Visit from 05/29/2023 in [facility] Visit from 03/12/2023 in [facility] Visit from 01/15/2023 in [facility] Office Visit from 12/03/2022 in [facility]  Total GAD-7 Score 13 6 8 18 20       PHQ2-9    Flowsheet Row Office Visit from 07/18/2023 in [facility] Visit from 05/29/2023 in [facility] Visit from 03/12/2023 in [facility] Visit from 01/15/2023 in [facility] Office Visit from 12/03/2022 in [facility] Total Score 2 2 2 5 5   PHQ-9 Total Score 7 10 5 19 23       Flowsheet Row ED to Hosp-Admission (Discharged) from 07/06/2023 in [facility] Visit from 01/15/2023 in [facility] ED from 11/25/2022 in [facility] at [facility]  C-SSRS RISK CATEGORY No Risk No Risk No Risk       Collaboration of Care: Collaboration of Care: Medication Management AEB medication prescription, Other provider involved in patient's care AEB ED and PCP chart review, and Referral or follow-up with counselor/therapist AEB chart review  Patient/Guardian was advised Release of Information must be obtained prior to any record release in order to collaborate their care with an outside provider. Patient/Guardian was advised if  they have not already done so to contact the registration department to sign all necessary forms in order for [place] to release information regarding their care.   Consent: Patient/Guardian gives verbal consent for treatment and assignment of benefits for services provided during this visit. Patient/Guardian expressed understanding and agreed to proceed.    Stasia Cavalier, MD 07/23/2023, 3:16 PM   Virtual Visit via Video Note  I connected with Margaretha Seeds on 07/23/23 at  3:00 PM EDT by a video enabled telemedicine application and verified that I am speaking with the correct person using two identifiers.  Location: Patient: Home Provider: Home Office   I discussed the limitations of evaluation and management by telemedicine and the availability of in person appointments. The patient expressed understanding and agreed to proceed.   I discussed the assessment and treatment plan with the patient. The patient was provided an opportunity to ask questions and all were answered. The patient agreed with the plan and demonstrated an understanding of the instructions.   The patient was advised to call back or seek an in-person evaluation if the symptoms worsen or if the condition fails to improve as anticipated.  I provided 15 minutes of non-face-to-face time during this encounter.   Stasia Cavalier, MD

## 2023-07-23 NOTE — Addendum Note (Signed)
Addended by: Everlena Cooper on: 07/23/2023 03:41 PM   Modules accepted: Orders

## 2023-07-25 ENCOUNTER — Ambulatory Visit (HOSPITAL_COMMUNITY): Payer: BC Managed Care – PPO | Admitting: Clinical

## 2023-07-25 ENCOUNTER — Encounter (HOSPITAL_COMMUNITY): Payer: Self-pay | Admitting: Clinical

## 2023-07-25 DIAGNOSIS — F330 Major depressive disorder, recurrent, mild: Secondary | ICD-10-CM

## 2023-07-25 DIAGNOSIS — F411 Generalized anxiety disorder: Secondary | ICD-10-CM | POA: Diagnosis not present

## 2023-07-25 NOTE — Progress Notes (Signed)
THERAPIST PROGRESS NOTE  Session Time:  8:00am-9:00am  Session #6  Participation Level: Active  Behavioral Response: Casual Alert Anxious and Depressed   Type of Therapy: Individual Therapy  Treatment Goals addressed:  LTG: Cory "[name]" will score less than 5 on the Generalized Anxiety Disorder 7 Scale (GAD-7)    STG: Cory "[name]" will reduce frequency of avoidant behaviors by 50% as evidenced by self-report in therapy sessions LTG: Reduce frequency, intensity, and duration of depression symptoms so that daily functioning is improved   STG: Reduce overall depression score to no more than 9 on the Patient Health Questionnaire (PHQ-9)   LTG: Cory "[name]" will demonstrate improvement in quality and duration of sleep as measured by self-report of more consistent hours slept   STG: Cory "[name]" will identify and try 4-5 non-medication sleep hygiene aids that they feel help them with sleep (earplugs, music, breathing, progressive relaxation, etc.)   ProgressTowards Goals: Progressing  Interventions: CBT, Supportive, and Other: goal-setting    Summary: Cory Gomez is a 42 y.o. male who presents with MDD/severe, GAD, insomnia, and relationship problems with his partner.  We processed the recent hospitalization for diverticulitis and his upcoming surgery, how it impacts his relationship with his wife and parents, and how he feels about being out of work to heal.  Patient identified feeling useless at this time because he has worked his whole life and cannot find sufficient to do to fill his time.  He struggles with not being resentful of his wife still, although she has found a job and is working.  He himself is receiving short-term disability pay at 60% orf his regular salary and is happy for this.  His coping technique that he tends to go to is more "letting things go" which sounds more like ignoring things, rather than reframing negative thoughts which CSW pointed out might be more beneficial in  the long-term.  He did share being happy about losing about 30 pounds while in the hospital and since then due to a severely restricted, mostly liquid and soft foods diet.  He identified the primary goal for him in life right now remains, as it has been recently, to focus on making himself better.  This has included more time in prayer.  CSW informed him we will review 5-finger breathing, specifically, among other breathing techniques, at the next session in order to prepare him for surgery, as [facility] doctors use that method to teach patients how to reduce the amount of anesthesia used during surgery.  Since patient is very nervous about any medication, he was excited about this.  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  It would be back in the field he has chosen and would be closer to home, so save money.  If he was offered the job even during this time of being sick and off work, he has formed a plan to take the job and pay for the surgery out-of-pocket since his insurance with the current job would terminate immediately.  CSW encouraged patient to continue to prioritize self-care, to practice positive self-talk, to communicate with his wife and parents as much as possible about not only his needs but also his feelings, and to work on reframing his negative thoughts about being "useless" by being out of work into being "useful" by taking care of his health that will prolong his life and make him healthier and happier  in the long-term.  CSW provided psychoeducation about how to challenge thoughts to determine if they are cognitive distortions, through use of the tool "5 steps to Untwisting Your Thinking."  CSW provided patient with a "Wheel of Life" dividing life into 8 categories that patient can evaluate as to how satisfied he currently is in those areas, in order to figure out which ones he will want to focus on.   CSW reviewed SMART goals and encouraged him to start to develop a SMART goal for each area of his life he wants to focus on for now, starting with one he identified immediately as "Personal Growth."  Suicidal/Homicidal: No  Therapist Response:  Patient is progressing AEB engaging in scheduled therapy session.  He presented oriented x5 and stated he was feeling "useless."  He shared this is because he has been written out of work at least until 8/17 to recover from his recent 10-day hospitalization for diverticulitis.  CSW evaluated patient's medication compliance and self-care since last session which appear to be appropriate.  CSW reviewed the last session with patient who reported his wife has now taken a job and is working to support them, but he is still angry because of her switching jobs so often that he cannot relax his vigilance about making money himself.  Patient stated he can no longer concern himself with worries about her, as he has to take care of himself and focus on getting better himself..  CSW taught CBT-related coping skills, specifically 5 Steps to Untwisting Your Thinking, Wheel of Life and  SMART goals.  Patient received these willingly and could discuss them in a way to indicate good comprehension.  CSW assigned patient the task of trying out these new coping skills (Wheel of Life, SMART goals, 5 steps to Untwisting your Thinking) prior to next session on 8/14.  CSW encouraged patient to schedule more therapy sessions for the future, as he only has one more set at this time.  Throughout the session, CSW gave patient the opportunity to explore thoughts and feelings associated with current life situations and past/present external stressors.   CSW encouraged patient's expression of feelings and validated patient's thoughts using empathy, active listening, open body language, and unconditional positive regard.     Recommendations:  Return to therapy in 2 weeks, engage in self care  behaviors, work on [employer] and SMART goals, as well as 5 steps to [employer] handouts  Plan: Return again in 2 weeks  Next appointment:  8/14  Diagnosis:  Encounter Diagnoses  Name Primary?   Major depressive disorder, recurrent episode, mild (HCC) Yes   GAD (generalized anxiety disorder)      Collaboration of Care: Psychiatrist AEB - psychiatrist and therapist both have access to notes in Epic  Patient/Guardian was advised Release of Information must be obtained prior to any record release in order to collaborate their care with an outside provider. Patient/Guardian was advised if they have not already done so to contact the registration department to sign all necessary forms in order for [place] to release information regarding their care.   Consent: Patient/Guardian gives verbal consent for treatment and assignment of benefits for services provided during this visit. Patient/Guardian expressed understanding and agreed to proceed.   Cory Chad, LCSW 8/1/2024Patient ID: [name], male   DOB: Aug 19, 1981, 42 y.o.   MRN: [id number]

## 2023-07-30 ENCOUNTER — Encounter: Payer: Self-pay | Admitting: Family Medicine

## 2023-08-03 ENCOUNTER — Encounter: Payer: Self-pay | Admitting: Family Medicine

## 2023-08-03 DIAGNOSIS — R1032 Left lower quadrant pain: Secondary | ICD-10-CM | POA: Insufficient documentation

## 2023-08-03 NOTE — Assessment & Plan Note (Signed)
Recent diagnosis Continues to have pain despite antibiotics Recommend he call surgeons office for sooner evaluation

## 2023-08-03 NOTE — Assessment & Plan Note (Signed)
Hospital follow up for acute diverticulitis.  Hemodynamically stable. Continues to have pain that is now worsening with subjective fevers On antibiotics Check CBC today Recommend patient call surgery for further recommendations, if unable to reach office recommend he return to ED for further evaluation given increased pain and subjective fevers

## 2023-08-03 NOTE — Assessment & Plan Note (Signed)
Recently diagnosed Continue Augmentin BID Call surgery for sooner evaluation If unable to reach recommend her go to ED for further evaluation

## 2023-08-03 NOTE — Assessment & Plan Note (Deleted)
Hospital follow up for acute diverticulitis.  Hemodynamically stable. Continues to have pain that is now worsening with subjective fevers On antibiotics Check CBC today Recommend patient call surgery for further recommendations, if unable to reach office recommend he return to ED for further evaluation given increased pain and

## 2023-08-07 ENCOUNTER — Encounter (HOSPITAL_COMMUNITY): Payer: Self-pay | Admitting: Clinical

## 2023-08-07 ENCOUNTER — Ambulatory Visit (HOSPITAL_COMMUNITY): Payer: BC Managed Care – PPO | Admitting: Clinical

## 2023-08-07 DIAGNOSIS — F411 Generalized anxiety disorder: Secondary | ICD-10-CM | POA: Diagnosis not present

## 2023-08-07 DIAGNOSIS — F330 Major depressive disorder, recurrent, mild: Secondary | ICD-10-CM

## 2023-08-07 NOTE — Progress Notes (Unsigned)
THERAPIST PROGRESS NOTE  Session Time:  8:00am-9:00am  Session #7  Participation Level: Active  Behavioral Response: Casual Alert Depressed   Type of Therapy: Individual Therapy  Treatment Goals addressed:  LTG: Cory "[name]" will score less than 5 on the Generalized Anxiety Disorder 7 Scale (GAD-7)    STG: Cory "[name]" will reduce frequency of avoidant behaviors by 50% as evidenced by self-report in therapy sessions LTG: Reduce frequency, intensity, and duration of depression symptoms so that daily functioning is improved   STG: Reduce overall depression score to no more than 9 on the Patient Health Questionnaire (PHQ-9)   LTG: Cory "[name]" will demonstrate improvement in quality and duration of sleep as measured by self-report of more consistent hours slept   STG: Cory "[name]" will identify and try 4-5 non-medication sleep hygiene aids that they feel help them with sleep (earplugs, music, breathing, progressive relaxation, etc.)   ProgressTowards Goals: Progressing  Interventions: CBT, Supportive, and Reframing   Summary: Cory Gomez is a 42 y.o. male who presents with MDD/severe, GAD, insomnia, and relationship problems with his wife.  He reported he did not get the job he interviewed for recently but still has a hopeful attitude.  We discussed his upcoming surgery.  CSW asked how much of a perfectionist he is, and he admitted to this difficult character trait.  We discussed a great deal about the things in life he can and cannot control.  The Wheel of Life assignment was somewhat confusing to him so he did not complete it.  CSW explained it again and then asked him to rate two areas:  Personal Growth which he rated at a 3 for satisfaction (out of 1-10, with 10 being the highest satisfaction); and Relationship which he rated at a 2.  He stated that he was not sure he was going to tell CSW about this prior to the session, but now was going to share that he and his wife had an argument this  morning during which she offered him a divorce and told him if that is what he wants she will accept it.  She is not taking her medicines and wants to quit her job again.  He asks her to go to therapy or even come with him and she refuses.  She hides her phone from him which makes him suspicious about why she might want to do that.  We processed this development at length.  CSW encouraged him to talk with her using "I" statements to see if they can salvage their marriage, telling him that years from now he will want to know that he did everything possible to save it.  CSW taught him 5-finger breathing and advised him to use it before talking with his wife, anytime that is going to happen.   Suicidal/Homicidal: No  Therapist Response:  Patient is progressing AEB engaging in scheduled therapy session.  He presented oriented x5 and stated he was feeling "okay."  Later he revealed that he was actually angry at his wife and that his marriage is possibly nearing a point of decision about whether to continue.  CSW evaluated patient's medication compliance and self-care since last session.  CSW reviewed the last session with patient who reported he did not completely understand the homework.  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  CSW taught coping skills, specifically 5-finger breathing.  Patient received this willingly and could discuss in a way to indicate good comprehension.  CSW assigned patient the task of trying this out twice daily prior to next session on 9/19.  CSW encouraged patient to schedule more therapy sessions for the future, as few were currently scheduled  Throughout the session, CSW gave patient the opportunity to explore thoughts and feelings associated with current life situations and past/present external stressors.   CSW  encouraged patient's expression of feelings and validated patient's thoughts using empathy, active listening, open body language, and unconditional positive regard.    Recommendations:  Return to therapy in 4 weeks, engage in self care behaviors, continue to work on [employer] and SMART goals, as well as 5 steps to [employer], particularly in connection with thoughts about wife  Plan: Return again in 4 weeks  Next appointment:  9/19  Diagnosis:  Encounter Diagnoses  Name Primary?   Major depressive disorder, recurrent episode, mild (HCC) Yes   GAD (generalized anxiety disorder)    Collaboration of Care: Psychiatrist AEB - psychiatrist and therapist both have access to notes in Epic  Patient/Guardian was advised Release of Information must be obtained prior to any record release in order to collaborate their care with an outside provider. Patient/Guardian was advised if they have not already done so to contact the registration department to sign all necessary forms in order for [place] to release information regarding their care.   Consent: Patient/Guardian gives verbal consent for treatment and assignment of benefits for services provided during this visit. Patient/Guardian expressed understanding and agreed to proceed.   Lynnell Chad, LCSW 8/14/2024Patient ID: Cory Gomez, male   DOB: 1981-12-08, 42 y.o.   MRN: [id number]

## 2023-09-09 ENCOUNTER — Ambulatory Visit: Payer: Self-pay | Admitting: Surgery

## 2023-09-09 DIAGNOSIS — R739 Hyperglycemia, unspecified: Secondary | ICD-10-CM

## 2023-09-09 NOTE — Progress Notes (Signed)
Sent message, via epic in basket, requesting orders in epic from surgeon.  

## 2023-09-12 ENCOUNTER — Encounter (HOSPITAL_COMMUNITY): Payer: Self-pay | Admitting: Clinical

## 2023-09-12 ENCOUNTER — Ambulatory Visit (INDEPENDENT_AMBULATORY_CARE_PROVIDER_SITE_OTHER): Payer: BC Managed Care – PPO | Admitting: Clinical

## 2023-09-12 DIAGNOSIS — Z630 Problems in relationship with spouse or partner: Secondary | ICD-10-CM | POA: Diagnosis not present

## 2023-09-12 DIAGNOSIS — F330 Major depressive disorder, recurrent, mild: Secondary | ICD-10-CM

## 2023-09-12 DIAGNOSIS — F411 Generalized anxiety disorder: Secondary | ICD-10-CM | POA: Diagnosis not present

## 2023-09-12 NOTE — Progress Notes (Signed)
THERAPIST PROGRESS NOTE  Session Time:  11:06am-12:02pm  Session #8  Participation Level: Active  Behavioral Response: Casual Alert Hopeless   Type of Therapy: Individual Therapy  Treatment Goals addressed:  LTG: Cory "[name]" will score less than 5 on the Generalized Anxiety Disorder 7 Scale (GAD-7)    STG: Cory "[name]" will reduce frequency of avoidant behaviors by 50% as evidenced by self-report in therapy sessions LTG: Reduce frequency, intensity, and duration of depression symptoms so that daily functioning is improved   STG: Reduce overall depression score to no more than 9 on the Patient Health Questionnaire (PHQ-9)   LTG: Cory "[name]" will demonstrate improvement in quality and duration of sleep as measured by self-report of more consistent hours slept   STG: Cory "[name]" will identify and try 4-5 non-medication sleep hygiene aids that they feel help them with sleep (earplugs, music, breathing, progressive relaxation, etc.)   ProgressTowards Goals: Progressing  Interventions: CBT, Supportive, and Reframing   Summary: Cory Gomez is a 42 y.o. male who presents with MDD/severe, GAD, insomnia, and relationship problems with his wife.  We discussed his upcoming surgery and recovery once again.  He will be staying with his parents so that mother can take care of him.  He does not feel his wife could adequately see to his needs.  CSW reminded him about 5-finger breathing and encouraged him to do this prior to surgery so that less anesthesia will be needed, perhaps.   He shared that he is finding it more difficult to say "I love you" to his wife, which really bothers him.  He said repeatedly that he is "tired of dealing with it" in reference to his wife not taking her medicine, quitting her jobs repeatedly, and not caring enough about herself to take care of simple things like taking her insulin.  He has tried to talk with her about the dire consequences of unchecked diabetes but does not  feel she listens.  CSW encouraged him to talk with wife using "I" statements to approach about anything he wishes to talk with her about at this point.   He was open to this without showing actual interest.  He stated that he has tried to tell her where all legal and financial information is located in case something happens to him, but she is not in the slightest bit interested and will not pay attention to him.    Financially, they are doing okay although not well.  He is on short-term disability from his job while she is in fact working currently.  He believes she has had about 70 jobs in the last 5 years.  He repeatedly asked, "Why would I waste my time when...." about various things in the relationship throughout the session.  CSW approached him about the difference in love, hate, and apathy with regard to a marital relationship.  He does not trust his pastor or religion in general now because pastor betrayed his trust, so now he goes to church but is not emotionally, intellectually, or spiritually engaged.  He stated when he asks his wife to take care of herself, she turns it around on him as though the problems are his fault.  CSW encouraged him to wait for next steps to occur before making any decisions.  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  He is not in a hurry but  seems to have almost decided that the marriage is doomed.  Suicidal/Homicidal: No  Therapist Response:  Patient is progressing AEB engaging in scheduled therapy session.  He presented oriented x5 and stated he was feeling "okay."  CSW evaluated patient's medication compliance and self-care since last session.  CSW reviewed the last session with patient who reported nothing has changed with his wife and he is feeling more hopeless about the marriage.  Patient stated if she loses or quits her current job, that would be a last straw for  him.   CSW encouraged patient to schedule more therapy sessions for the future, as there is only one remaining currently  Throughout the session, CSW gave patient the opportunity to explore thoughts and feelings associated with current life situations and past/present external stressors.   CSW encouraged patient's expression of feelings and validated patient's thoughts using empathy, active listening, open body language, and unconditional positive regard.      Recommendations:  Return to therapy in 2 weeks, engage in self care behaviors, continue to think about marriage and what he needs from wife, as well as whether he can give her what she wants from him  Plan: Return again in 2 weeks  Next appointment:  10/10  Diagnosis:  Encounter Diagnoses  Name Primary?   Major depressive disorder, recurrent episode, mild (HCC) Yes   GAD (generalized anxiety disorder)    Relationship problem between partners     Collaboration of Care: Psychiatrist AEB - psychiatrist and therapist both have access to notes in Epic  Patient/Guardian was advised Release of Information must be obtained prior to any record release in order to collaborate their care with an outside provider. Patient/Guardian was advised if they have not already done so to contact the registration department to sign all necessary forms in order for [place] to release information regarding their care.   Consent: Patient/Guardian gives verbal consent for treatment and assignment of benefits for services provided during this visit. Patient/Guardian expressed understanding and agreed to proceed.   Lynnell Chad, LCSW 9/19/2024Patient ID: Cory Gomez, male   DOB: Dec 07, 1981, 42 y.o.   MRN: [id number]

## 2023-09-14 NOTE — Patient Instructions (Addendum)
SURGICAL WAITING ROOM VISITATION Patients having surgery or a procedure may have no more than 2 support people in the waiting area - these visitors may rotate in the visitor waiting room.   Due to an increase in RSV and influenza rates and associated hospitalizations, children ages 3 and under may not visit patients in [facility] hospitals. If the patient needs to stay at the hospital during part of their recovery, the visitor guidelines for inpatient rooms apply.  PRE-OP VISITATION  Pre-op nurse will coordinate an appropriate time for 1 support person to accompany the patient in pre-op.  This support person may not rotate.  This visitor will be contacted when the time is appropriate for the visitor to come back in the pre-op area.  Please refer to the [facility] website for the visitor guidelines for Inpatients (after your surgery is over and you are in a regular room).  You are not required to quarantine at this time prior to your surgery. However, you must do this: Hand Hygiene often Do NOT share personal items Notify your provider if you are in close contact with someone who has COVID or you develop fever 100.4 or greater, new onset of sneezing, cough, sore throat, shortness of breath or body aches.  If you test positive for Covid or have been in contact with anyone that has tested positive in the last 10 days please notify you surgeon.    Your procedure is scheduled on:  Wednesday  September 25, 2023  Report to [facility] Main Entrance: [facility] entrance where the [employer] is available.   Report to admitting at: 06:15    AM  Call this number if you have any questions or problems the morning of surgery [phone]  DRINK two (2) bottles of Pre-Surgery G2 drink starting at 6:00 pm the evening prior to your surgery to help prevent dehydration. Increase drinking clear fluids (see list Below)          Do not eat food after Midnight the night prior to your  surgery/procedure.  After Midnight you may have the following liquids until 05:30  AM DAY OF SURGERY  Clear Liquid Diet Water Black Coffee (sugar ok, NO MILK/CREAM OR CREAMERS)  Tea (sugar ok, NO MILK/CREAM OR CREAMERS) regular and decaf                             Plain Jell-O  with no fruit (NO RED)                                           Fruit ices (not with fruit pulp, NO RED)                                     Popsicles (NO RED)                                                                  Juice: NO CITRUS JUICES: only apple, WHITE grape, WHITE cranberry Sports drinks like Gatorade or Powerade (NO RED)  The day of surgery:  Drink ONE (1) Pre-Surgery G2 at   05:30     AM the morning of surgery. Drink in one sitting. Do not sip.  This drink was given to you during your hospital pre-op appointment visit. Nothing else to drink after completing the Pre-Surgery  G2 : No candy, chewing gum or throat lozenges.    FOLLOW BOWEL PREP AND ANY ADDITIONAL PRE OP INSTRUCTIONS YOU RECEIVED FROM YOUR SURGEON'S OFFICE!!!   Oral Hygiene is also important to reduce your risk of infection.        Remember - BRUSH YOUR TEETH THE MORNING OF SURGERY WITH YOUR REGULAR TOOTHPASTE  Do NOT smoke after Midnight the night before surgery.  STOP TAKING all Vitamins, Herbs and supplements 1 week before your surgery.   Take ONLY these medicines the morning of surgery with A SIP OF WATER: Bupropion (Wellbutrin), Duloxetine (Cymbalta)                You may not have any metal on your body including , jewelry, and body piercing  Do not wear , lotions, powders, cologne, or deodorant  Men may shave face and neck.  Contacts, Hearing Aids, dentures or bridgework may not be worn into surgery. DENTURES WILL BE REMOVED PRIOR TO SURGERY PLEASE DO NOT APPLY "Poly grip" OR ADHESIVES!!!  You may bring a small overnight bag with you on the day of surgery, only pack items that are not valuable.  [facility] IS NOT RESPONSIBLE   FOR VALUABLES THAT ARE LOST OR STOLEN.   Do not bring your home medications to the hospital. The Pharmacy will dispense medications listed on your medication list to you during your admission in the Hospital.  Please read over the following fact sheets you were given: IF YOU HAVE QUESTIONS ABOUT YOUR PRE-OP INSTRUCTIONS, PLEASE CALL [phone]   [facility] - Preparing for Surgery Before surgery, you can play an important role.  Because skin is not sterile, your skin needs to be as free of germs as possible.  You can reduce the number of germs on your skin by washing with CHG (chlorahexidine gluconate) soap before surgery.  CHG is an antiseptic cleaner which kills germs and bonds with the skin to continue killing germs even after washing. Please DO NOT use if you have an allergy to CHG or antibacterial soaps.  If your skin becomes reddened/irritated stop using the CHG and inform your nurse when you arrive at Short Stay. Do not shave (including legs and underarms) for at least 48 hours prior to the first CHG shower.  You may shave your face/neck.  Please follow these instructions carefully:  1.  Shower with CHG Soap the night before surgery and the  morning of surgery.  2.  If you choose to wash your hair, wash your hair first as usual with your normal  shampoo.  3.  After you shampoo, rinse your hair and body thoroughly to remove the shampoo.                             4.  Use CHG as you would any other liquid soap.  You can apply chg directly to the skin and wash.  Gently with a scrungie or clean washcloth.  5.  Apply the CHG Soap to your body ONLY FROM THE NECK DOWN.   Do not use on face/ open  Wound or open sores. Avoid contact with eyes, ears mouth and genitals (private parts).                       Wash face,  Genitals (private parts) with your normal soap.             6.  Wash thoroughly, paying special attention to the area where  your  surgery  will be performed.  7.  Thoroughly rinse your body with warm water from the neck down.  8.  DO NOT shower/wash with your normal soap after using and rinsing off the CHG Soap.            9.  Pat yourself dry with a clean towel.            10.  Wear clean pajamas.            11.  Place clean sheets on your bed the night of your first shower and do not  sleep with pets.  ON THE DAY OF SURGERY : Do not apply any lotions/deodorants the morning of surgery.  Please wear clean clothes to the hospital/surgery center.     FAILURE TO FOLLOW THESE INSTRUCTIONS MAY RESULT IN THE CANCELLATION OF YOUR SURGERY  PATIENT SIGNATURE_________________________________  NURSE SIGNATURE__________________________________  ________________________________________________________________________       Cory Gomez    An incentive spirometer is a tool that can help keep your lungs clear and active. This tool measures how well you are filling your lungs with each breath. Taking long deep breaths may help reverse or decrease the chance of developing breathing (pulmonary) problems (especially infection) following: A long period of time when you are unable to move or be active. BEFORE THE PROCEDURE  If the spirometer includes an indicator to show your best effort, your nurse or respiratory therapist will set it to a desired goal. If possible, sit up straight or lean slightly forward. Try not to slouch. Hold the incentive spirometer in an upright position. INSTRUCTIONS FOR USE  Sit on the edge of your bed if possible, or sit up as far as you can in bed or on a chair. Hold the incentive spirometer in an upright position. Breathe out normally. Place the mouthpiece in your mouth and seal your lips tightly around it. Breathe in slowly and as deeply as possible, raising the piston or the ball toward the top of the column. Hold your breath for 3-5 seconds or for as long as possible. Allow the  piston or ball to fall to the bottom of the column. Remove the mouthpiece from your mouth and breathe out normally. Rest for a few seconds and repeat Steps 1 through 7 at least 10 times every 1-2 hours when you are awake. Take your time and take a few normal breaths between deep breaths. The spirometer may include an indicator to show your best effort. Use the indicator as a goal to work toward during each repetition. After each set of 10 deep breaths, practice coughing to be sure your lungs are clear. If you have an incision (the cut made at the time of surgery), support your incision when coughing by placing a pillow or rolled up towels firmly against it. Once you are able to get out of bed, walk around indoors and cough well. You may stop using the incentive spirometer when instructed by your caregiver.  RISKS AND COMPLICATIONS Take your time so you do not get dizzy or light-headed. If you  are in pain, you may need to take or ask for pain medication before doing incentive spirometry. It is harder to take a deep breath if you are having pain. AFTER USE Rest and breathe slowly and easily. It can be helpful to keep track of a log of your progress. Your caregiver can provide you with a simple table to help with this. If you are using the spirometer at home, follow these instructions: SEEK MEDICAL CARE IF:  You are having difficultly using the spirometer. You have trouble using the spirometer as often as instructed. Your pain medication is not giving enough relief while using the spirometer. You develop fever of 100.5 F (38.1 C) or higher.                                                                                                    SEEK IMMEDIATE MEDICAL CARE IF:  You cough up bloody sputum that had not been present before. You develop fever of 102 F (38.9 C) or greater. You develop worsening pain at or near the incision site. MAKE SURE YOU:  Understand these instructions. Will watch  your condition. Will get help right away if you are not doing well or get worse. Document Released: 04/22/2007 Document Revised: 03/03/2012 Document Reviewed: 06/23/2007 [facility] Patient Information 2014 [place], [place].     WHAT IS A BLOOD TRANSFUSION? Blood Transfusion Information  A transfusion is the replacement of blood or some of its parts. Blood is made up of multiple cells which provide different functions. Red blood cells carry oxygen and are used for blood loss replacement. White blood cells fight against infection. Platelets control bleeding. Plasma helps clot blood. Other blood products are available for specialized needs, such as hemophilia or other clotting disorders. BEFORE THE TRANSFUSION  Who gives blood for transfusions?  Healthy volunteers who are fully evaluated to make sure their blood is safe. This is blood bank blood. Transfusion therapy is the safest it has ever been in the practice of medicine. Before blood is taken from a donor, a complete history is taken to make sure that person has no history of diseases nor engages in risky social behavior (examples are intravenous drug use or sexual activity with multiple partners). The donor's travel history is screened to minimize risk of transmitting infections, such as malaria. The donated blood is tested for signs of infectious diseases, such as HIV and hepatitis. The blood is then tested to be sure it is compatible with you in order to minimize the chance of a transfusion reaction. If you or a relative donates blood, this is often done in anticipation of surgery and is not appropriate for emergency situations. It takes many days to process the donated blood. RISKS AND COMPLICATIONS Although transfusion therapy is very safe and saves many lives, the main dangers of transfusion include:  Getting an infectious disease. Developing a transfusion reaction. This is an allergic reaction to something in the blood you were given.  Every precaution is taken to prevent this. The decision to have a blood transfusion has been considered carefully by your caregiver  before blood is given. Blood is not given unless the benefits outweigh the risks. AFTER THE TRANSFUSION Right after receiving a blood transfusion, you will usually feel much better and more energetic. This is especially true if your red blood cells have gotten low (anemic). The transfusion raises the level of the red blood cells which carry oxygen, and this usually causes an energy increase. The nurse administering the transfusion will monitor you carefully for complications. HOME CARE INSTRUCTIONS  No special instructions are needed after a transfusion. You may find your energy is better. Speak with your caregiver about any limitations on activity for underlying diseases you may have. SEEK MEDICAL CARE IF:  Your condition is not improving after your transfusion. You develop redness or irritation at the intravenous (IV) site. SEEK IMMEDIATE MEDICAL CARE IF:  Any of the following symptoms occur over the next 12 hours: Shaking chills. You have a temperature by mouth above 102 F (38.9 C), not controlled by medicine. Chest, back, or muscle pain. People around you feel you are not acting correctly or are confused. Shortness of breath or difficulty breathing. Dizziness and fainting. You get a rash or develop hives. You have a decrease in urine output. Your urine turns a dark color or changes to pink, red, or brown. Any of the following symptoms occur over the next 10 days: You have a temperature by mouth above 102 F (38.9 C), not controlled by medicine. Shortness of breath. Weakness after normal activity. The white part of the eye turns yellow (jaundice). You have a decrease in the amount of urine or are urinating less often. Your urine turns a dark color or changes to pink, red, or brown. Document Released: 12/07/2000 Document Revised: 03/03/2012 Document  Reviewed: 07/26/2008 [facility] Patient Information 2014 [place], [place].  _______________________________________________________________________

## 2023-09-14 NOTE — Progress Notes (Signed)
COVID Vaccine received:  []  No [x]  Yes Date of any COVID positive Test in last 90 days:  PCP - [name], MD  Cardiologist - none  Chest x-Azekiel -  EKG -  will do at PST  Stress Test -  ECHO -  Cardiac Cath -   PCR screen: []  Ordered & Completed           [x]   No Order but Needs PROFEND           []   N/A for this surgery  Surgery Plan:  []  Ambulatory   []  Outpatient in bed  [x]  Admit  Anesthesia:    [x]  General  []  Spinal  []   Choice []   MAC  Bowel Prep - []  No  [x]   Yes _Colonoscopy the day before surgery  Pacemaker / ICD device [x]  No []  Yes   Spinal Cord Stimulator:[x]  No []  Yes       History of Sleep Apnea? [x]  No []  Yes   CPAP used?- [x]  No []  Yes    Does the patient monitor blood sugar?  []  No []  Yes  []  N/A Patient has: []  NO Hx DM   [x]  Pre-DM   []  DM1  []   DM2 Last A1c was: 5.8  on   05-08-2023    no Med Does patient have a [employer] or [employer]om? []  No []  Yes   Fasting Blood Sugar Ranges-  Checks Blood Sugar _____ times a day  Blood Thinner / Instructions:  none Aspirin Instructions:  none  ERAS Protocol Ordered: []  No  [x]  Yes PRE-SURGERY []  ENSURE  [x]  G2 X3   Patient is to be NPO after: 0530  Comments:  Pt is having colonoscopy on 09/24/23 at 8:30 am, will need to stay on clear liquids after colonoscopy, then take bowel prep antibiotics at 2 pm, 3 pm, 10 pm.   Activity level: Patient is able / unable to climb a flight of stairs without difficulty; []  No CP  []  No SOB, but would have ___   Patient can / can not perform ADLs without assistance.   Anesthesia review: Pre-DM, MDD, ADHD, GAD, HTN, Nystagmus, ?LFTs  Patient denies shortness of breath, fever, cough and chest pain at PAT appointment.  Patient verbalized understanding and agreement to the Pre-Surgical Instructions that were given to them at this PAT appointment. Patient was also educated of the need to review these PAT instructions again prior to his surgery.I reviewed the appropriate phone  numbers to call if they have any and questions or concerns.

## 2023-09-16 ENCOUNTER — Encounter (HOSPITAL_COMMUNITY)
Admission: RE | Admit: 2023-09-16 | Discharge: 2023-09-16 | Disposition: A | Discharge status: Home / Self Care | Payer: BC Managed Care – PPO | Source: Ambulatory Visit | Attending: Anesthesiology | Admitting: Anesthesiology

## 2023-09-16 DIAGNOSIS — R7303 Prediabetes: Secondary | ICD-10-CM

## 2023-09-16 DIAGNOSIS — R7989 Other specified abnormal findings of blood chemistry: Secondary | ICD-10-CM

## 2023-09-16 DIAGNOSIS — I10 Essential (primary) hypertension: Secondary | ICD-10-CM

## 2023-09-16 DIAGNOSIS — Z01818 Encounter for other preprocedural examination: Secondary | ICD-10-CM

## 2023-09-17 NOTE — Patient Instructions (Addendum)
SURGICAL WAITING ROOM VISITATION  Patients having surgery or a procedure may have no more than 2 support people in the waiting area - these visitors may rotate.    Children under the age of 76 must have an adult with them who is not the patient.  Due to an increase in RSV and influenza rates and associated hospitalizations, children ages 61 and under may not visit patients in [facility] hospitals.  If the patient needs to stay at the hospital during part of their recovery, the visitor guidelines for inpatient rooms apply. Pre-op nurse will coordinate an appropriate time for 1 support person to accompany patient in pre-op.  This support person may not rotate.    Please refer to the [facility] website for the visitor guidelines for Inpatients (after your surgery is over and you are in a regular room).       Your procedure is scheduled on: 09/24/24   Report to [facility] Main Entrance    Report to admitting at 6:15 AM   Call this number if you have problems the morning of surgery [phone]   Do not eat food :After Midnight.   After Midnight you may have the following liquids until  5:30 AM DAY OF SURGERY  Water Non-Citrus Juices (without pulp, NO RED-Apple, White grape, White cranberry) Black Coffee (NO MILK/CREAM OR CREAMERS, sugar ok)  Clear Tea (NO MILK/CREAM OR CREAMERS, sugar ok) regular and decaf                             Plain Jell-O (NO RED)                                           Fruit ices (not with fruit pulp, NO RED)                                     Popsicles (NO RED)                                                               Sports drinks like Gatorade (NO RED)              Drink 2 G2 drinks AT 10:00 PM the night before surgery.         The day of surgery:  Drink ONE (1) Pre-SurgeryG2 at 5:50 AM the morning of surgery. Drink in one sitting. Do not sip.  This drink was given to you during your hospital  pre-op appointment visit. Nothing else  to drink after completing the  Pre-Surgery  G2.          If you have questions, please contact your surgeon's office.   FOLLOW BOWEL PREP AND ANY ADDITIONAL PRE OP INSTRUCTIONS YOU RECEIVED FROM YOUR SURGEON'S OFFICE!!!     Oral Hygiene is also important to reduce your risk of infection.  Remember - BRUSH YOUR TEETH THE MORNING OF SURGERY WITH YOUR REGULAR TOOTHPASTE   Stop all vitamins and herbal supplements 7 days before surgery.   Take these medicines the morning of surgery : Wellbutrin, Cymbalta   DO NOT TAKE ANY ORAL DIABETIC MEDICATIONS DAY OF YOUR SURGERY  Bring CPAP mask and tubing day of surgery.                              You may not have any metal on your body including hair pins, jewelry, and body piercing             Do not wear make-up, lotions, powders, perfumes/cologne, or deodorant  Do not wear nail polish including gel and S&S, artificial/acrylic nails, or any other type of covering on natural nails including finger and toenails. If you have artificial nails, gel coating, etc. that needs to be removed by a nail salon please have this removed prior to surgery or surgery may need to be canceled/ delayed if the surgeon/ anesthesia feels like they are unable to be safely monitored.   Do not shave  48 hours prior to surgery.               Men may shave face and neck.   Do not bring valuables to the hospital. [facility] IS NOT             RESPONSIBLE   FOR VALUABLES.   Contacts, glasses, dentures or bridgework may not be worn into surgery.   Bring small overnight bag day of surgery.   DO NOT BRING YOUR HOME MEDICATIONS TO THE HOSPITAL. PHARMACY WILL DISPENSE MEDICATIONS LISTED ON YOUR MEDICATION LIST TO YOU DURING YOUR ADMISSION IN THE HOSPITAL!    Patients discharged on the day of surgery will not be allowed to drive home.  Someone NEEDS to stay with you for the first 24 hours after anesthesia.   Special Instructions: Bring a  copy of your healthcare power of attorney and living will documents the day of surgery if you haven't scanned them before.              Please read over the following fact sheets you were given: IF YOU HAVE QUESTIONS ABOUT YOUR PRE-OP INSTRUCTIONS PLEASE CALL [phone]   If you received a COVID test during your pre-op visit  it is requested that you wear a mask when out in public, stay away from anyone that may not be feeling well and notify your surgeon if you develop symptoms. If you test positive for Covid or have been in contact with anyone that has tested positive in the last 10 days please notify you surgeon.    [facility] - Preparing for Surgery Before surgery, you can play an important role.  Because skin is not sterile, your skin needs to be as free of germs as possible.  You can reduce the number of germs on your skin by washing with CHG (chlorahexidine gluconate) soap before surgery.  CHG is an antiseptic cleaner which kills germs and bonds with the skin to continue killing germs even after washing. Please DO NOT use if you have an allergy to CHG or antibacterial soaps.  If your skin becomes reddened/irritated stop using the CHG and inform your nurse when you arrive at Short Stay. Do not shave (including legs and underarms) for at least 48 hours prior to the first CHG shower.  You may shave your  face/neck.  Please follow these instructions carefully:  1.  Shower with CHG Soap the night before surgery and the  morning of surgery.  2.  If you choose to wash your hair, wash your hair first as usual with your normal  shampoo.  3.  After you shampoo, rinse your hair and body thoroughly to remove the shampoo.                             4.  Use CHG as you would any other liquid soap.  You can apply chg directly to the skin and wash.  Gently with a scrungie or clean washcloth.  5.  Apply the CHG Soap to your body ONLY FROM THE NECK DOWN.   Do   not use on face/ open                            Wound or open sores. Avoid contact with eyes, ears mouth and   genitals (private parts).                       Wash face,  Genitals (private parts) with your normal soap.             6.  Wash thoroughly, paying special attention to the area where your    surgery  will be performed.  7.  Thoroughly rinse your body with warm water from the neck down.  8.  DO NOT shower/wash with your normal soap after using and rinsing off the CHG Soap.                9.  Pat yourself dry with a clean towel.            10.  Wear clean pajamas.            11.  Place clean sheets on your bed the night of your first shower and do not  sleep with pets. Day of Surgery : Do not apply any lotions/deodorants the morning of surgery.  Please wear clean clothes to the hospital/surgery center.  FAILURE TO FOLLOW THESE INSTRUCTIONS MAY RESULT IN THE CANCELLATION OF YOUR SURGERY  PATIENT SIGNATURE_________________________________  NURSE SIGNATURE__________________________________   Incentive Spirometer  An incentive spirometer is a tool that can help keep your lungs clear and active. This tool measures how well you are filling your lungs with each breath. Taking long deep breaths may help reverse or decrease the chance of developing breathing (pulmonary) problems (especially infection) following: A long period of time when you are unable to move or be active. BEFORE THE PROCEDURE  If the spirometer includes an indicator to show your best effort, your nurse or respiratory therapist will set it to a desired goal. If possible, sit up straight or lean slightly forward. Try not to slouch. Hold the incentive spirometer in an upright position. INSTRUCTIONS FOR USE  Sit on the edge of your bed if possible, or sit up as far as you can in bed or on a chair. Hold the incentive spirometer in an upright position. Breathe out normally. Place the mouthpiece in your mouth and seal your lips tightly around it. Breathe in slowly and  as deeply as possible, raising the piston or the ball toward the top of the column. Hold your breath for 3-5 seconds or for as long as possible. Allow the piston or ball  to fall to the bottom of the column. Remove the mouthpiece from your mouth and breathe out normally. Rest for a few seconds and repeat Steps 1 through 7 at least 10 times every 1-2 hours when you are awake. Take your time and take a few normal breaths between deep breaths. The spirometer may include an indicator to show your best effort. Use the indicator as a goal to work toward during each repetition. After each set of 10 deep breaths, practice coughing to be sure your lungs are clear. If you have an incision (the cut made at the time of surgery), support your incision when coughing by placing a pillow or rolled up towels firmly against it. Once you are able to get out of bed, walk around indoors and cough well. You may stop using the incentive spirometer when instructed by your caregiver.  RISKS AND COMPLICATIONS Take your time so you do not get dizzy or light-headed. If you are in pain, you may need to take or ask for pain medication before doing incentive spirometry. It is harder to take a deep breath if you are having pain. AFTER USE Rest and breathe slowly and easily. It can be helpful to keep track of a log of your progress. Your caregiver can provide you with a simple table to help with this. If you are using the spirometer at home, follow these instructions: SEEK MEDICAL CARE IF:  You are having difficultly using the spirometer. You have trouble using the spirometer as often as instructed. Your pain medication is not giving enough relief while using the spirometer. You develop fever of 100.5 F (38.1 C) or higher. SEEK IMMEDIATE MEDICAL CARE IF:  You cough up bloody sputum that had not been present before. You develop fever of 102 F (38.9 C) or greater. You develop worsening pain at or near the incision site. MAKE  SURE YOU:  Understand these instructions. Will watch your condition. Will get help right away if you are not doing well or get worse.  WHAT IS A BLOOD TRANSFUSION? Blood Transfusion Information  A transfusion is the replacement of blood or some of its parts. Blood is made up of multiple cells which provide different functions. Red blood cells carry oxygen and are used for blood loss replacement. White blood cells fight against infection. Platelets control bleeding. Plasma helps clot blood. Other blood products are available for specialized needs, such as hemophilia or other clotting disorders. BEFORE THE TRANSFUSION  Who gives blood for transfusions?  Healthy volunteers who are fully evaluated to make sure their blood is safe. This is blood bank blood. Transfusion therapy is the safest it has ever been in the practice of medicine. Before blood is taken from a donor, a complete history is taken to make sure that person has no history of diseases nor engages in risky social behavior (examples are intravenous drug use or sexual activity with multiple partners). The donor's travel history is screened to minimize risk of transmitting infections, such as malaria. The donated blood is tested for signs of infectious diseases, such as HIV and hepatitis. The blood is then tested to be sure it is compatible with you in order to minimize the chance of a transfusion reaction. If you or a relative donates blood, this is often done in anticipation of surgery and is not appropriate for emergency situations. It takes many days to process the donated blood. RISKS AND COMPLICATIONS Although transfusion therapy is very safe and saves many lives, the main dangers  of transfusion include:  Getting an infectious disease. Developing a transfusion reaction. This is an allergic reaction to something in the blood you were given. Every precaution is taken to prevent this. The decision to have a blood transfusion has been  considered carefully by your caregiver before blood is given. Blood is not given unless the benefits outweigh the risks. AFTER THE TRANSFUSION Right after receiving a blood transfusion, you will usually feel much better and more energetic. This is especially true if your red blood cells have gotten low (anemic). The transfusion raises the level of the red blood cells which carry oxygen, and this usually causes an energy increase. The nurse administering the transfusion will monitor you carefully for complications. HOME CARE INSTRUCTIONS  No special instructions are needed after a transfusion. You may find your energy is better. Speak with your caregiver about any limitations on activity for underlying diseases you may have. SEEK MEDICAL CARE IF:  Your condition is not improving after your transfusion. You develop redness or irritation at the intravenous (IV) site. SEEK IMMEDIATE MEDICAL CARE IF:  Any of the following symptoms occur over the next 12 hours: Shaking chills. You have a temperature by mouth above 102 F (38.9 C), not controlled by medicine. Chest, back, or muscle pain. People around you feel you are not acting correctly or are confused. Shortness of breath or difficulty breathing. Dizziness and fainting. You get a rash or develop hives. You have a decrease in urine output. Your urine turns a dark color or changes to pink, red, or brown. Any of the following symptoms occur over the next 10 days: You have a temperature by mouth above 102 F (38.9 C), not controlled by medicine. Shortness of breath. Weakness after normal activity. The white part of the eye turns yellow (jaundice). You have a decrease in the amount of urine or are urinating less often. Your urine turns a dark color or changes to pink, red, or brown. Document Released: 12/07/2000 Document Revised: 03/03/2012 Document Reviewed: 07/26/2008 [facility] Patient Information 2014 [place], [place].

## 2023-09-18 ENCOUNTER — Encounter (HOSPITAL_COMMUNITY): Payer: Self-pay

## 2023-09-18 ENCOUNTER — Other Ambulatory Visit: Payer: Self-pay

## 2023-09-18 ENCOUNTER — Encounter (HOSPITAL_COMMUNITY)
Admission: RE | Admit: 2023-09-18 | Discharge: 2023-09-18 | Disposition: A | Discharge status: Home / Self Care | Payer: BC Managed Care – PPO | Source: Ambulatory Visit | Attending: Surgery | Admitting: Surgery

## 2023-09-18 DIAGNOSIS — R7303 Prediabetes: Secondary | ICD-10-CM | POA: Diagnosis not present

## 2023-09-18 DIAGNOSIS — Z01818 Encounter for other preprocedural examination: Secondary | ICD-10-CM | POA: Diagnosis not present

## 2023-09-18 DIAGNOSIS — R7989 Other specified abnormal findings of blood chemistry: Secondary | ICD-10-CM | POA: Diagnosis not present

## 2023-09-18 DIAGNOSIS — I10 Essential (primary) hypertension: Secondary | ICD-10-CM | POA: Insufficient documentation

## 2023-09-18 HISTORY — DX: Personal history of urinary calculi: Z87.442

## 2023-09-18 LAB — CBC
HCT: 36.6 % — ABNORMAL LOW (ref 39.0–52.0)
Hemoglobin: 11.5 g/dL — ABNORMAL LOW (ref 13.0–17.0)
MCH: 25.2 pg — ABNORMAL LOW (ref 26.0–34.0)
MCHC: 31.4 g/dL (ref 30.0–36.0)
MCV: 80.1 fL (ref 80.0–100.0)
Platelets: 370 10*3/uL (ref 150–400)
RBC: 4.57 MIL/uL (ref 4.22–5.81)
RDW: 14.8 % (ref 11.5–15.5)
WBC: 7.3 10*3/uL (ref 4.0–10.5)
nRBC: 0 % (ref 0.0–0.2)

## 2023-09-18 LAB — COMPREHENSIVE METABOLIC PANEL
ALT: 15 U/L (ref 0–44)
AST: 15 U/L (ref 15–41)
Albumin: 4 g/dL (ref 3.5–5.0)
Alkaline Phosphatase: 57 U/L (ref 38–126)
Anion gap: 11 (ref 5–15)
BUN: 11 mg/dL (ref 6–20)
CO2: 23 mmol/L (ref 22–32)
Calcium: 9.2 mg/dL (ref 8.9–10.3)
Chloride: 100 mmol/L (ref 98–111)
Creatinine, Ser: 0.74 mg/dL (ref 0.61–1.24)
GFR, Estimated: >60 mL/min (ref >60)
Glucose, Bld: 100 mg/dL — ABNORMAL HIGH (ref 70–99)
Potassium: 3.9 mmol/L (ref 3.5–5.1)
Sodium: 134 mmol/L — ABNORMAL LOW (ref 135–145)
Total Bilirubin: 0.6 mg/dL (ref 0.3–1.2)
Total Protein: 8.1 g/dL (ref 6.5–8.1)

## 2023-09-18 LAB — TYPE AND SCREEN
ABO/RH(D): O POS
Antibody Screen: NEGATIVE

## 2023-09-18 NOTE — Consult Note (Signed)
WOC Nurse requested for preoperative stoma site marking  Discussed surgical procedure and stoma creation with patient.  Explained role of the WOC nurse team.  Provided the patient with educational booklet and provided samples of pouching options.  Answered patient  questions.   Examined patient lying, sitting, and standing in order to place the marking in the patient's visual field, away from any creases or abdominal contour issues and within the rectus muscle.  Unable to mark below belt line as wears pants very low    Marked for colostomy in the LLQ  5 cm to the left of the umbilicus and 4 cm below the umbilicus.  Marked for ileostomy in the RLQ  5 cm to the right of the umbilicus and  3.5  cm below the umbilicus.   Patient's abdomen cleansed with CHG wipes at site markings, allowed to air dry prior to marking.Covered mark with thin film transparent dressing to preserve mark until date of surgery. Patients surgery is scheduled for 09/25/2023, did give patient marker and extra transparent films in case he needs to remark  areas.    WOC Nurse team will follow up with patient after surgery for continue ostomy care and teaching.   Thank you,    Priscella Mann MSN, RN-BC, [employer] [phone]

## 2023-09-18 NOTE — Progress Notes (Signed)
COVID Vaccine received:  []  No [x]  Yes Date of any COVID positive Test in last 90 days: no PCP - [name], MD  Cardiologist - none  Chest x-Ivar -  EKG -  09/18/23 Stress Test -  ECHO -  Cardiac Cath -   PCR screen: []  Ordered & Completed           [x]   No Order but Needs PROFEND           []   N/A for this surgery  Surgery Plan:  []  Ambulatory   []  Outpatient in bed  [x]  Admit  Anesthesia:    [x]  General  []  Spinal  []   Choice []   MAC  Bowel Prep - []  No  [x]   Yes _Colonoscopy the day before surgery  Pacemaker / ICD device [x]  No []  Yes   Spinal Cord Stimulator:[x]  No []  Yes       History of Sleep Apnea? [x]  No []  Yes   CPAP used?- [x]  No []  Yes    Does the patient monitor blood sugar?  []  No []  Yes  []  N/A Patient has: []  NO Hx DM   [x]  Pre-DM   []  DM1  []   DM2 Last A1c was: 5.8  on   05-08-2023    no Med Does patient have a [employer] or Dexacom? []  No []  Yes   Fasting Blood Sugar Ranges-  Checks Blood Sugar _____ times a day  Blood Thinner / Instructions:  none Aspirin Instructions:  none  ERAS Protocol Ordered: []  No  [x]  Yes PRE-SURGERY []  ENSURE  [x]  G2 X3   Patient is to be NPO after: 0530  Comments:  Pt is having colonoscopy on 09/24/23 at 8:30 am, will need to stay on clear liquids after colonoscopy, then take bowel prep antibiotics at 2 pm, 3 pm, 10 pm.   Activity level: Patient is able to climb a flight of stairs without difficulty; [x]  No CP  [x]  No SOB,    Patient cant perform ADLs without assistance.  no Anesthesia review: Pre-DM, MDD, ADHD, GAD, HTN, Nystagmus, ?LFTs  Patient denies shortness of breath, fever, cough and chest pain at PAT appointment.  Patient verbalized understanding and agreement to the Pre-Surgical Instructions that were given to them at this PAT appointment. Patient was also educated of the need to review these PAT instructions again prior to his surgery.I reviewed the appropriate phone numbers to call if they have any and  questions or concerns.

## 2023-09-19 ENCOUNTER — Ambulatory Visit: Payer: BC Managed Care – PPO | Admitting: Internal Medicine

## 2023-09-19 ENCOUNTER — Other Ambulatory Visit: Payer: Self-pay

## 2023-09-19 ENCOUNTER — Encounter: Payer: Self-pay | Admitting: Internal Medicine

## 2023-09-19 VITALS — BP 128/80 | HR 106 | Ht 74.0 in | Wt 279.0 lb

## 2023-09-19 DIAGNOSIS — K5780 Diverticulitis of intestine, part unspecified, with perforation and abscess without bleeding: Secondary | ICD-10-CM

## 2023-09-19 MED ORDER — NA SULFATE-K SULFATE-MG SULF 17.5-3.13-1.6 GM/177ML PO SOLN
1.0000 | Freq: Once | ORAL | 0 refills | Status: AC
  Filled 2023-09-19: qty 354, 1d supply, fill #0

## 2023-09-19 NOTE — Progress Notes (Signed)
Patient ID: Cory Gomez, male   DOB: October 05, 1981, 42 y.o.   MRN: [id number] HPI: Cory Gomez is a 42 year old male with a July 2024 hospitalization for complicated sigmoid diverticulitis with perforation and abscess treated with medical management, history of ADHD, hyperlipidemia who is seen in consult at the request of Dr. Michaell Cowing in the setting of his complicated diverticulitis.  He is here alone today.  He was hospitalized for 8 days in July 2024 for complicated sigmoid diverticulitis with perforation and abscess.  IR did not find a window for abscess drainage and so he was treated medically with broad-spectrum antibiotics.  He did not require surgery at that time.  Symptoms started rather abruptly.  He woke up feeling like he possibly strained a muscle in his left lower abdomen.  This progressed and he went to the ER thinking he had a kidney stone.  There CT scan showed complicated diverticulitis and he was admitted.  He reports his symptoms have completely resolved.  He has not had issue with recurrent abdominal pain.  His bowel habits have been regular without diarrhea or constipation.  No blood in stool or melena.  He typically has a bowel movement once per day.  He had never had prior diverticulitis before this episode.  He denies a family history of colon cancer and IBD. He works in [employer]. He does not use tobacco.  He has a surgery scheduled for sigmoid resection with Dr. Michaell Cowing on 09/25/2023.  He is tentatively scheduled for a diagnostic colonoscopy with Dr. Marina Goodell the day prior.  He has never had prior colonoscopy.  Past Medical History:  Diagnosis Date   ADHD (attention deficit hyperactivity disorder)    Annual physical exam 09/09/2019   Anxiety and depression    COVID-19    12/28/19   Depression, recurrent (HCC) 09/09/2019   Elevated liver enzymes 06/11/2019   Excessive ear wax, left 09/09/2019   Headache 12/31/2021   History of kidney stones     Hypertriglyceridemia 07/17/2021   Insomnia    Insomnia    Inverted nipple 09/16/2019   Kidney stone    Mastoiditis of both sides    Nystagmus 01/22/2022   Obesity (BMI 30-39.9)    Obesity (BMI 30-39.9)    Prediabetes    Suicidal ideation 07/14/2021   Vertigo     Past Surgical History:  Procedure Laterality Date   NO PAST SURGERIES      Outpatient Medications Prior to Visit  Medication Sig Dispense Refill   buPROPion (WELLBUTRIN XL) 150 MG 24 hr tablet Take 1 tablet (150 mg total) by mouth every morning. 30 tablet 2   DULoxetine (CYMBALTA) 30 MG capsule Take 3 capsules (90 mg total) by mouth daily. 90 capsule 2   eszopiclone (LUNESTA) 2 MG TABS tablet Take 1 tablet (2 mg total) by mouth at bedtime as needed for sleep. Take immediately before bedtime 30 tablet 2   No facility-administered medications prior to visit.    No Known Allergies  Family History  Problem Relation Age of Onset   Diabetes Mother    Hypertension Mother    Hyperlipidemia Mother    Diabetes Mellitus II Mother    Anxiety disorder Mother    Depression Mother    Stroke Father    Alcoholism Father    Anxiety disorder Brother    Depression Brother    Hypertension Brother    Hyperlipidemia Brother    Diabetes type II Brother    ADD / ADHD  Brother    Dementia Maternal Grandfather    Stomach cancer Neg Hx    Colon cancer Neg Hx    Esophageal cancer Neg Hx    Pancreatic cancer Neg Hx     Social History   Tobacco Use   Smoking status: Never   Smokeless tobacco: Never  Vaping Use   Vaping status: Never Used  Substance Use Topics   Alcohol use: Never   Drug use: Never    ROS: As per history of present illness, otherwise negative  BP 128/80   Pulse (!) 106   Ht 6\' 2"  (1.88 m)   Wt 279 lb (126.6 kg)   SpO2 99%   BMI 35.82 kg/m  Constitutional: Well-developed and well-nourished. No distress. HEENT: Normocephalic and atraumatic. Oropharynx is clear and moist.  No scleral  icterus. Cardiovascular: Normal rate, regular rhythm and intact distal pulses. No M/R/G Pulmonary/chest: Effort normal and breath sounds normal. No wheezing, rales or rhonchi. Abdominal: Soft, obese, nontender, nondistended. Bowel sounds active throughout. There are no masses palpable. No hepatosplenomegaly. Extremities: no clubbing, cyanosis, or edema Neurological: Alert and oriented to person place and time. Skin: Skin is warm and dry.  Psychiatric: Normal mood and affect. Behavior is normal.  RELEVANT LABS AND IMAGING: CBC    Component Value Date/Time   WBC 7.3 09/18/2023 1007   RBC 4.57 09/18/2023 1007   HGB 11.5 (L) 09/18/2023 1007   HCT 36.6 (L) 09/18/2023 1007   PLT 370 09/18/2023 1007   MCV 80.1 09/18/2023 1007   MCH 25.2 (L) 09/18/2023 1007   MCHC 31.4 09/18/2023 1007   RDW 14.8 09/18/2023 1007   LYMPHSABS 1.2 07/07/2023 0422   MONOABS 0.8 07/07/2023 0422   EOSABS 0.2 07/07/2023 0422   BASOSABS 0.1 07/07/2023 0422    CMP     Component Value Date/Time   NA 134 (L) 09/18/2023 1007   K 3.9 09/18/2023 1007   CL 100 09/18/2023 1007   CO2 23 09/18/2023 1007   GLUCOSE 100 (H) 09/18/2023 1007   BUN 11 09/18/2023 1007   CREATININE 0.74 09/18/2023 1007   CALCIUM 9.2 09/18/2023 1007   PROT 8.1 09/18/2023 1007   ALBUMIN 4.0 09/18/2023 1007   AST 15 09/18/2023 1007   ALT 15 09/18/2023 1007   ALKPHOS 57 09/18/2023 1007   BILITOT 0.6 09/18/2023 1007   GFRNONAA >60 09/18/2023 1007   CT ABDOMEN AND PELVIS WITH CONTRAST   TECHNIQUE: Multidetector CT imaging of the abdomen and pelvis was performed using the standard protocol following bolus administration of intravenous contrast.   RADIATION DOSE REDUCTION: This exam was performed according to the departmental dose-optimization program which includes automated exposure control, adjustment of the mA and/or kV according to patient size and/or use of iterative reconstruction technique.   CONTRAST:  OMNIPAQUE  IOHEXOL 300 MG/ML  SOLN   COMPARISON:  Noncontrast CT on 06/15/2010   FINDINGS: Lower Chest: No acute findings.   Hepatobiliary: No suspicious hepatic masses identified. Gallbladder is unremarkable. No evidence of biliary ductal dilatation.   Pancreas:  No mass or inflammatory changes.   Spleen: Within normal limits in size and appearance.   Adrenals/Urinary Tract: No suspicious masses identified. No evidence of ureteral calculi or hydronephrosis.   Stomach/Bowel: Normal appendix visualized. Moderate to severe wall thickening is seen involving the sigmoid colon with surrounding pericolonic inflammatory changes. No definite diverticular disease identified by CT. Extraluminal gas and fluid collection is seen within the central sigmoid mesocolon, measuring 3.3 x 3.2 cm. This  is consistent with sigmoid diverticulitis or colitis with pericolonic abscess. No evidence of free intraperitoneal air.   Vascular/Lymphatic: No pathologically enlarged lymph nodes. No acute vascular findings.   Reproductive:  No mass or other significant abnormality.   Other:  None.   Musculoskeletal:  No suspicious bone lesions identified.   IMPRESSION: Perforated sigmoid colitis or diverticulitis, with 3 cm pericolonic abscess in sigmoid mesocolon.     Electronically Signed   By: Danae Orleans M.D.   On: 07/06/2023 13:58    CT ABDOMEN AND PELVIS WITH CONTRAST   TECHNIQUE: Multidetector CT imaging of the abdomen and pelvis was performed using the standard protocol following bolus administration of intravenous contrast.   RADIATION DOSE REDUCTION: This exam was performed according to the departmental dose-optimization program which includes automated exposure control, adjustment of the mA and/or kV according to patient size and/or use of iterative reconstruction technique.   CONTRAST:  OMNIPAQUE IOHEXOL 300 MG/ML  SOLN   COMPARISON:  July 06, 2023.   FINDINGS: Lower chest: No acute  abnormality.   Hepatobiliary: No focal liver abnormality is seen. Status post cholecystectomy. No biliary dilatation.   Pancreas: Unremarkable. No pancreatic ductal dilatation or surrounding inflammatory changes.   Spleen: Normal in size without focal abnormality.   Adrenals/Urinary Tract: Adrenal glands are unremarkable. Kidneys are normal, without renal calculi, focal lesion, or hydronephrosis. Bladder is unremarkable.   Stomach/Bowel: The stomach and appendix are unremarkable. There is no evidence of bowel obstruction. Severe inflammatory changes are again noted involving the sigmoid colon consistent with a history of perforated diverticulitis. 5.1 x 2.8 cm pericolonic abscess is noted again.   Vascular/Lymphatic: No significant vascular findings are present. No enlarged abdominal or pelvic lymph nodes.   Reproductive: Prostate is unremarkable.   Other: No hernia is noted.   Musculoskeletal: No acute or significant osseous findings.   IMPRESSION: Severe sigmoid diverticulitis is again noted with continued presence of pericolonic abscess consistent with perforation as noted on prior study. There does appear to be increased wall thickening and inflammation involving the more distal sigmoid colon compared to prior exam.     Electronically Signed   By: Lupita Raider M.D.   On: 07/11/2023 13:41  ASSESSMENT/PLAN: 42 year old male with a July 2024 hospitalization for complicated sigmoid diverticulitis with perforation and abscess treated with medical management, history of ADHD, hyperlipidemia who is seen in consult at the request of Dr. Michaell Cowing in the setting of his complicated diverticulitis.   Complicated sigmoid diverticulitis with perforation and abscess treated medically July 2024 --fortunately he did respond to medical therapy and he has had resolution of his abdominal pain and has not had any recent bowel habit changes.  Given the complexity of his initial presentation  of diverticulitis and the risk for subsequent complication he is scheduled for sigmoid resection with Dr. Michaell Cowing on 2 October.  Colonoscopy prior to this is recommended and currently scheduled with Dr. Marina Goodell.  We reviewed the risk, benefits and alternatives to colonoscopy and he is agreeable and wishes to proceed.  We discussed the bowel preparation and he knows that he will remain on clear liquids after the colonoscopy per surgery protocol.  He has already been to [facility] for his preoperative appointment and lab work. -- Colonoscopy in the [facility] with Dr. Marina Goodell on 09/24/2023 -- Patient to remain on clear liquids after colonoscopy with plans for surgical resection 09/25/2023 with Dr. Michaell Cowing    Cc:[name], Md [address] [place],  [place] [address]

## 2023-09-19 NOTE — Patient Instructions (Signed)
You have been scheduled for a colonoscopy. Please follow written instructions given to you at your visit today.   Please pick up your prep supplies at the pharmacy within the next 1-3 days.  If you use inhalers (even only as needed), please bring them with you on the day of your procedure.  DO NOT TAKE 7 DAYS PRIOR TO TEST- Trulicity (dulaglutide) Ozempic, Wegovy (semaglutide) Mounjaro (tirzepatide) Bydureon Bcise (exanatide extended release)  DO NOT TAKE 1 DAY PRIOR TO YOUR TEST Rybelsus (semaglutide) Adlyxin (lixisenatide) Victoza (liraglutide) Byetta (exanatide) ___________________________________________________________________________   If your blood pressure at your visit was 140/90 or greater, please contact your primary care physician to follow up on this.  _______________________________________________________  If you are age 76 or older, your body mass index should be between 23-30. Your Body mass index is 35.82 kg/m. If this is out of the aforementioned range listed, please consider follow up with your Primary Care Provider.  If you are age 71 or younger, your body mass index should be between 19-25. Your Body mass index is 35.82 kg/m. If this is out of the aformentioned range listed, please consider follow up with your Primary Care Provider.   ________________________________________________________  The [facility] providers would like to encourage you to use [facility] to communicate with providers for non-urgent requests or questions.  Due to long hold times on the telephone, sending your provider a message by [facility] may be a faster and more efficient way to get a response.  Please allow 48 business hours for a response.  Please remember that this is for non-urgent requests.  _______________________________________________________

## 2023-09-24 ENCOUNTER — Encounter: Payer: Self-pay | Admitting: Internal Medicine

## 2023-09-24 ENCOUNTER — Ambulatory Visit (AMBULATORY_SURGERY_CENTER): Payer: BC Managed Care – PPO | Admitting: Internal Medicine

## 2023-09-24 VITALS — BP 117/72 | HR 80 | Temp 99.3°F | Resp 16 | Ht 74.0 in | Wt 279.0 lb

## 2023-09-24 DIAGNOSIS — K5780 Diverticulitis of intestine, part unspecified, with perforation and abscess without bleeding: Secondary | ICD-10-CM | POA: Diagnosis not present

## 2023-09-24 DIAGNOSIS — Z538 Procedure and treatment not carried out for other reasons: Secondary | ICD-10-CM | POA: Diagnosis not present

## 2023-09-24 DIAGNOSIS — R935 Abnormal findings on diagnostic imaging of other abdominal regions, including retroperitoneum: Secondary | ICD-10-CM

## 2023-09-24 HISTORY — PX: COLONOSCOPY: SHX174

## 2023-09-24 MED ORDER — SODIUM CHLORIDE 0.9 % IV SOLN: 500.0000 mL | Freq: Once | INTRAVENOUS | Status: DC

## 2023-09-24 NOTE — Progress Notes (Signed)
Pt's states no medical or surgical changes since previsit or office visit. 

## 2023-09-24 NOTE — Anesthesia Preprocedure Evaluation (Signed)
Anesthesia Evaluation  Patient identified by MRN, date of birth, ID band Patient awake    Reviewed: Allergy & Precautions, NPO status , Patient's Chart, lab work & pertinent test results  Airway Mallampati: II  TM Distance: >3 FB Neck ROM: Full    Dental no notable dental hx.    Pulmonary neg pulmonary ROS   Pulmonary exam normal        Cardiovascular hypertension,  Rhythm:Regular Rate:Normal     Neuro/Psych  Headaches  Anxiety Depression       GI/Hepatic Neg liver ROS,,,Diverticulitis    Endo/Other  negative endocrine ROS    Renal/GU   negative genitourinary   Musculoskeletal negative musculoskeletal ROS (+)    Abdominal Normal abdominal exam  (+)   Peds  Hematology Lab Results      Component                Value               Date                      WBC                      7.3                 09/18/2023                HGB                      11.5 (L)            09/18/2023                HCT                      36.6 (L)            09/18/2023                MCV                      80.1                09/18/2023                PLT                      370                 09/18/2023             Lab Results      Component                Value               Date                      NA                       134 (L)             09/18/2023                K                        3.9  09/18/2023                CO2                      23                  09/18/2023                GLUCOSE                  100 (H)             09/18/2023                BUN                      11                  09/18/2023                CREATININE               0.74                09/18/2023                CALCIUM                  9.2                 09/18/2023                GFR                      104.75              05/08/2023                GFRNONAA                 >60                 09/18/2023               Anesthesia Other Findings   Reproductive/Obstetrics                             Anesthesia Physical Anesthesia Plan  ASA: 3  Anesthesia Plan: General   Post-op Pain Management: Tylenol PO (pre-op)*, Celebrex PO (pre-op)* and Gabapentin PO (pre-op)*   Induction: Intravenous  PONV Risk Score and Plan: 2 and Ondansetron, Dexamethasone, Midazolam and Treatment may vary due to age or medical condition  Airway Management Planned: Mask and Oral ETT  Additional Equipment: None  Intra-op Plan:   Post-operative Plan: Extubation in OR  Informed Consent: I have reviewed the patients History and Physical, chart, labs and discussed the procedure including the risks, benefits and alternatives for the proposed anesthesia with the patient or authorized representative who has indicated his/her understanding and acceptance.     Dental advisory given  Plan Discussed with: CRNA  Anesthesia Plan Comments:        Anesthesia Quick Evaluation

## 2023-09-24 NOTE — Patient Instructions (Signed)
Resume previous diet Continue present medications Await further instructions from Dr Lauro Franklin office or Dr Gordy Savers office regarding upcoming surgery   YOU HAD AN ENDOSCOPIC PROCEDURE TODAY AT THE [facility]:   Refer to the procedure report that was given to you for any specific questions about what was found during the examination.  If the procedure report does not answer your questions, please call your gastroenterologist to clarify.  If you requested that your care partner not be given the details of your procedure findings, then the procedure report has been included in a sealed envelope for you to review at your convenience later.  YOU SHOULD EXPECT: Some feelings of bloating in the abdomen. Passage of more gas than usual.  Walking can help get rid of the air that was put into your GI tract during the procedure and reduce the bloating. If you had a lower endoscopy (such as a colonoscopy or flexible sigmoidoscopy) you may notice spotting of blood in your stool or on the toilet paper. If you underwent a bowel prep for your procedure, you may not have a normal bowel movement for a few days.  Please Note:  You might notice some irritation and congestion in your nose or some drainage.  This is from the oxygen used during your procedure.  There is no need for concern and it should clear up in a day or so.  SYMPTOMS TO REPORT IMMEDIATELY:  Following lower endoscopy (colonoscopy):  Excessive amounts of blood in the stool  Significant tenderness or worsening of abdominal pains  Swelling of the abdomen that is new, acute  Fever of 100F or higher  For urgent or emergent issues, a gastroenterologist can be reached at any hour by calling [phone]. Do not use MyChart messaging for urgent concerns.    DIET:  We do recommend a small meal at first, but then you may proceed to your regular diet.  Drink plenty of fluids but you should avoid alcoholic beverages for 24 hours.  ACTIVITY:   You should plan to take it easy for the rest of today and you should NOT DRIVE or use heavy machinery until tomorrow (because of the sedation medicines used during the test).    FOLLOW UP: Our staff will call the number listed on your records the next business day following your procedure.  We will call around 7:15- 8:00 am to check on you and address any questions or concerns that you may have regarding the information given to you following your procedure. If we do not reach you, we will leave a message.      SIGNATURES/CONFIDENTIALITY: You and/or your care partner have signed paperwork which will be entered into your electronic medical record.  These signatures attest to the fact that that the information above on your After Visit Summary has been reviewed and is understood.  Full responsibility of the confidentiality of this discharge information lies with you and/or your care-partner.

## 2023-09-24 NOTE — Progress Notes (Signed)
Expand All Collapse All Patient ID: Cory Gomez, male   DOB: August 09, 1981, 42 y.o.   MRN: [id number] HPI: Cory Gomez is a 42 year old male with a July 2024 hospitalization for complicated sigmoid diverticulitis with perforation and abscess treated with medical management, history of ADHD, hyperlipidemia who is seen in consult at the request of Dr. Michaell Cowing in the setting of his complicated diverticulitis.  He is here alone today.   He was hospitalized for 8 days in July 2024 for complicated sigmoid diverticulitis with perforation and abscess.  IR did not find a window for abscess drainage and so he was treated medically with broad-spectrum antibiotics.  He did not require surgery at that time.  Symptoms started rather abruptly.  He woke up feeling like he possibly strained a muscle in his left lower abdomen.  This progressed and he went to the ER thinking he had a kidney stone.  There CT scan showed complicated diverticulitis and he was admitted.   He reports his symptoms have completely resolved.  He has not had issue with recurrent abdominal pain.  His bowel habits have been regular without diarrhea or constipation.  No blood in stool or melena.  He typically has a bowel movement once per day.  He had never had prior diverticulitis before this episode.   He denies a family history of colon cancer and IBD. He works in [employer]. He does not use tobacco.   He has a surgery scheduled for sigmoid resection with Dr. Michaell Cowing on 09/25/2023.  He is tentatively scheduled for a diagnostic colonoscopy with Dr. Marina Goodell the day prior.  He has never had prior colonoscopy.       Past Medical History:  Diagnosis Date   ADHD (attention deficit hyperactivity disorder)     Annual physical exam 09/09/2019   Anxiety and depression     COVID-19      12/28/19   Depression, recurrent (HCC) 09/09/2019   Elevated liver enzymes 06/11/2019   Excessive ear wax, left 09/09/2019   Headache  12/31/2021   History of kidney stones     Hypertriglyceridemia 07/17/2021   Insomnia     Insomnia     Inverted nipple 09/16/2019   Kidney stone     Mastoiditis of both sides     Nystagmus 01/22/2022   Obesity (BMI 30-39.9)     Obesity (BMI 30-39.9)     Prediabetes     Suicidal ideation 07/14/2021   Vertigo                 Past Surgical History:  Procedure Laterality Date   NO PAST SURGERIES                    Outpatient Medications Prior to Visit  Medication Sig Dispense Refill   buPROPion (WELLBUTRIN XL) 150 MG 24 hr tablet Take 1 tablet (150 mg total) by mouth every morning. 30 tablet 2   DULoxetine (CYMBALTA) 30 MG capsule Take 3 capsules (90 mg total) by mouth daily. 90 capsule 2   eszopiclone (LUNESTA) 2 MG TABS tablet Take 1 tablet (2 mg total) by mouth at bedtime as needed for sleep. Take immediately before bedtime 30 tablet 2      No facility-administered medications prior to visit.        Allergies  No Known Allergies          Family History  Problem Relation Age of Onset   Diabetes Mother  Hypertension Mother     Hyperlipidemia Mother     Diabetes Mellitus II Mother     Anxiety disorder Mother     Depression Mother     Stroke Father     Alcoholism Father     Anxiety disorder Brother     Depression Brother     Hypertension Brother     Hyperlipidemia Brother     Diabetes type II Brother     ADD / ADHD Brother     Dementia Maternal Grandfather     Stomach cancer Neg Hx     Colon cancer Neg Hx     Esophageal cancer Neg Hx     Pancreatic cancer Neg Hx            Social History  Social History        Tobacco Use   Smoking status: Never   Smokeless tobacco: Never  Vaping Use   Vaping status: Never Used  Substance Use Topics   Alcohol use: Never   Drug use: Never        ROS: As per history of present illness, otherwise negative   BP 128/80   Pulse (!) 106   Ht 6\' 2"  (1.88 m)   Wt 279 lb (126.6 kg)   SpO2 99%   BMI 35.82  kg/m  Constitutional: Well-developed and well-nourished. No distress. HEENT: Normocephalic and atraumatic. Oropharynx is clear and moist.  No scleral icterus. Cardiovascular: Normal rate, regular rhythm and intact distal pulses. No M/R/G Pulmonary/chest: Effort normal and breath sounds normal. No wheezing, rales or rhonchi. Abdominal: Soft, obese, nontender, nondistended. Bowel sounds active throughout. There are no masses palpable. No hepatosplenomegaly. Extremities: no clubbing, cyanosis, or edema Neurological: Alert and oriented to person place and time. Skin: Skin is warm and dry.  Psychiatric: Normal mood and affect. Behavior is normal.   RELEVANT LABS AND IMAGING: CBC Labs (Brief)          Component Value Date/Time    WBC 7.3 09/18/2023 1007    RBC 4.57 09/18/2023 1007    HGB 11.5 (L) 09/18/2023 1007    HCT 36.6 (L) 09/18/2023 1007    PLT 370 09/18/2023 1007    MCV 80.1 09/18/2023 1007    MCH 25.2 (L) 09/18/2023 1007    MCHC 31.4 09/18/2023 1007    RDW 14.8 09/18/2023 1007    LYMPHSABS 1.2 07/07/2023 0422    MONOABS 0.8 07/07/2023 0422    EOSABS 0.2 07/07/2023 0422    BASOSABS 0.1 07/07/2023 0422        CMP     Labs (Brief)          Component Value Date/Time    NA 134 (L) 09/18/2023 1007    K 3.9 09/18/2023 1007    CL 100 09/18/2023 1007    CO2 23 09/18/2023 1007    GLUCOSE 100 (H) 09/18/2023 1007    BUN 11 09/18/2023 1007    CREATININE 0.74 09/18/2023 1007    CALCIUM 9.2 09/18/2023 1007    PROT 8.1 09/18/2023 1007    ALBUMIN 4.0 09/18/2023 1007    AST 15 09/18/2023 1007    ALT 15 09/18/2023 1007    ALKPHOS 57 09/18/2023 1007    BILITOT 0.6 09/18/2023 1007    GFRNONAA >60 09/18/2023 1007      CT ABDOMEN AND PELVIS WITH CONTRAST   TECHNIQUE: Multidetector CT imaging of the abdomen and pelvis was performed using the standard protocol following bolus administration of intravenous contrast.  RADIATION DOSE REDUCTION: This exam was performed according  to the departmental dose-optimization program which includes automated exposure control, adjustment of the mA and/or kV according to patient size and/or use of iterative reconstruction technique.   CONTRAST:  OMNIPAQUE IOHEXOL 300 MG/ML  SOLN   COMPARISON:  Noncontrast CT on 06/15/2010   FINDINGS: Lower Chest: No acute findings.   Hepatobiliary: No suspicious hepatic masses identified. Gallbladder is unremarkable. No evidence of biliary ductal dilatation.   Pancreas:  No mass or inflammatory changes.   Spleen: Within normal limits in size and appearance.   Adrenals/Urinary Tract: No suspicious masses identified. No evidence of ureteral calculi or hydronephrosis.   Stomach/Bowel: Normal appendix visualized. Moderate to severe wall thickening is seen involving the sigmoid colon with surrounding pericolonic inflammatory changes. No definite diverticular disease identified by CT. Extraluminal gas and fluid collection is seen within the central sigmoid mesocolon, measuring 3.3 x 3.2 cm. This is consistent with sigmoid diverticulitis or colitis with pericolonic abscess. No evidence of free intraperitoneal air.   Vascular/Lymphatic: No pathologically enlarged lymph nodes. No acute vascular findings.   Reproductive:  No mass or other significant abnormality.   Other:  None.   Musculoskeletal:  No suspicious bone lesions identified.   IMPRESSION: Perforated sigmoid colitis or diverticulitis, with 3 cm pericolonic abscess in sigmoid mesocolon.     Electronically Signed   By: Danae Orleans M.D.   On: 07/06/2023 13:58     CT ABDOMEN AND PELVIS WITH CONTRAST   TECHNIQUE: Multidetector CT imaging of the abdomen and pelvis was performed using the standard protocol following bolus administration of intravenous contrast.   RADIATION DOSE REDUCTION: This exam was performed according to the departmental dose-optimization program which includes automated exposure control,  adjustment of the mA and/or kV according to patient size and/or use of iterative reconstruction technique.   CONTRAST:  OMNIPAQUE IOHEXOL 300 MG/ML  SOLN   COMPARISON:  July 06, 2023.   FINDINGS: Lower chest: No acute abnormality.   Hepatobiliary: No focal liver abnormality is seen. Status post cholecystectomy. No biliary dilatation.   Pancreas: Unremarkable. No pancreatic ductal dilatation or surrounding inflammatory changes.   Spleen: Normal in size without focal abnormality.   Adrenals/Urinary Tract: Adrenal glands are unremarkable. Kidneys are normal, without renal calculi, focal lesion, or hydronephrosis. Bladder is unremarkable.   Stomach/Bowel: The stomach and appendix are unremarkable. There is no evidence of bowel obstruction. Severe inflammatory changes are again noted involving the sigmoid colon consistent with a history of perforated diverticulitis. 5.1 x 2.8 cm pericolonic abscess is noted again.   Vascular/Lymphatic: No significant vascular findings are present. No enlarged abdominal or pelvic lymph nodes.   Reproductive: Prostate is unremarkable.   Other: No hernia is noted.   Musculoskeletal: No acute or significant osseous findings.   IMPRESSION: Severe sigmoid diverticulitis is again noted with continued presence of pericolonic abscess consistent with perforation as noted on prior study. There does appear to be increased wall thickening and inflammation involving the more distal sigmoid colon compared to prior exam.     Electronically Signed   By: Lupita Raider M.D.   On: 07/11/2023 13:41   ASSESSMENT/PLAN: 42 year old male with a July 2024 hospitalization for complicated sigmoid diverticulitis with perforation and abscess treated with medical management, history of ADHD, hyperlipidemia who is seen in consult at the request of Dr. Michaell Cowing in the setting of his complicated diverticulitis.    Complicated sigmoid diverticulitis with perforation  and abscess treated medically July 2024 --  fortunately he did respond to medical therapy and he has had resolution of his abdominal pain and has not had any recent bowel habit changes.  Given the complexity of his initial presentation of diverticulitis and the risk for subsequent complication he is scheduled for sigmoid resection with Dr. Michaell Cowing on 2 October.  Colonoscopy prior to this is recommended and currently scheduled with Dr. Marina Goodell.  We reviewed the risk, benefits and alternatives to colonoscopy and he is agreeable and wishes to proceed.  We discussed the bowel preparation and he knows that he will remain on clear liquids after the colonoscopy per surgery protocol.  He has already been to [facility] for his preoperative appointment and lab work. -- Colonoscopy in the [facility] with Dr. Marina Goodell on 09/24/2023 -- Patient to remain on clear liquids after colonoscopy with plans for surgical resection 09/25/2023 with Dr. Michaell Cowing       Cc:[name], Md [address] [place],  [place] [address]

## 2023-09-24 NOTE — Progress Notes (Signed)
Sedate, gd SR, tolerated procedure well, VSS, report to RN 

## 2023-09-24 NOTE — Op Note (Signed)
[facility] Patient Name: Cory Gomez Procedure Date: 09/24/2023 8:48 AM MRN: [id number] Endoscopist: Wilhemina Bonito. Marina Goodell , MD, [id number] Age: 42 Referring MD:  Date of Birth: 1981/11/12 Gender: Male Account #: [id number] Procedure:                Colonoscopy Indications:              Abnormal CT of the GI tract, Pre-op exam Medicines:                Monitored Anesthesia Care Procedure:                Pre-Anesthesia Assessment:                           - Prior to the procedure, a History and Physical                            was performed, and patient medications and                            allergies were reviewed. The patient's tolerance of                            previous anesthesia was also reviewed. The risks                            and benefits of the procedure and the sedation                            options and risks were discussed with the patient.                            All questions were answered, and informed consent                            was obtained. Prior Anticoagulants: The patient has                            taken no anticoagulant or antiplatelet agents. ASA                            Grade Assessment: II - A patient with mild systemic                            disease. After reviewing the risks and benefits,                            the patient was deemed in satisfactory condition to                            undergo the procedure.                           After obtaining informed consent, the colonoscope  was passed under direct vision. Throughout the                            procedure, the patient's blood pressure, pulse, and                            oxygen saturations were monitored continuously. The                            CF [id number] #[id number] was introduced through the anus                            and advanced to the the rectum. The quality of the                            bowel preparation  was poor. The colonoscopy was                            performed without difficulty. The patient tolerated                            the procedure well. The bowel preparation used was                            SUPREP via split dose instruction. Scope In: 9:00:03 AM Scope Out: 9:00:28 AM Total Procedure Duration: 0 hours 0 minutes 25 seconds  Findings:                 The exam was aborted due to very poor prep. Complications:            No immediate complications. Estimated blood loss:                            None. Estimated Blood Loss:     Estimated blood loss: none. Impression:               - Preparation of the colon was poor.                           - The exam was aborted.                           - No specimens collected. Recommendation:           - Repeat colonoscopy to be arranged by Drs. Pyrtle                            and Gross. They have been notified.                           - Patient has a contact number available for                            emergencies. The signs and symptoms of potential  delayed complications were discussed with the                            patient. Return to normal activities tomorrow.                            Written discharge instructions were provided to the                            patient.                           - Resume previous diet.                           - Continue present medications. Wilhemina Bonito. Marina Goodell, MD 09/24/2023 9:14:50 AM This report has been signed electronically.

## 2023-09-25 ENCOUNTER — Other Ambulatory Visit: Payer: Self-pay

## 2023-09-25 ENCOUNTER — Encounter (HOSPITAL_COMMUNITY): Payer: Self-pay | Admitting: Surgery

## 2023-09-25 ENCOUNTER — Inpatient Hospital Stay (HOSPITAL_COMMUNITY)
Admission: RE | Admit: 2023-09-25 | Discharge: 2023-09-27 | DRG: 330 | Disposition: A | Discharge status: Home / Self Care | Payer: BC Managed Care – PPO | Source: Ambulatory Visit | Attending: Surgery | Admitting: Surgery

## 2023-09-25 ENCOUNTER — Encounter (HOSPITAL_COMMUNITY)
Admission: RE | Disposition: A | Discharge status: Home / Self Care | Payer: Self-pay | Source: Ambulatory Visit | Attending: Surgery

## 2023-09-25 ENCOUNTER — Inpatient Hospital Stay (HOSPITAL_COMMUNITY): Payer: BC Managed Care – PPO | Admitting: Registered Nurse

## 2023-09-25 ENCOUNTER — Telehealth: Payer: Self-pay | Admitting: *Deleted

## 2023-09-25 DIAGNOSIS — Z8249 Family history of ischemic heart disease and other diseases of the circulatory system: Secondary | ICD-10-CM | POA: Diagnosis not present

## 2023-09-25 DIAGNOSIS — C187 Malignant neoplasm of sigmoid colon: Secondary | ICD-10-CM | POA: Diagnosis not present

## 2023-09-25 DIAGNOSIS — Z833 Family history of diabetes mellitus: Secondary | ICD-10-CM

## 2023-09-25 DIAGNOSIS — Z6835 Body mass index (BMI) 35.0-35.9, adult: Secondary | ICD-10-CM | POA: Diagnosis not present

## 2023-09-25 DIAGNOSIS — Z818 Family history of other mental and behavioral disorders: Secondary | ICD-10-CM | POA: Diagnosis not present

## 2023-09-25 DIAGNOSIS — R739 Hyperglycemia, unspecified: Secondary | ICD-10-CM

## 2023-09-25 DIAGNOSIS — F3341 Major depressive disorder, recurrent, in partial remission: Secondary | ICD-10-CM | POA: Diagnosis not present

## 2023-09-25 DIAGNOSIS — I10 Essential (primary) hypertension: Secondary | ICD-10-CM | POA: Diagnosis present

## 2023-09-25 DIAGNOSIS — E66812 Obesity, class 2: Secondary | ICD-10-CM | POA: Diagnosis present

## 2023-09-25 DIAGNOSIS — F418 Other specified anxiety disorders: Secondary | ICD-10-CM | POA: Diagnosis not present

## 2023-09-25 DIAGNOSIS — C772 Secondary and unspecified malignant neoplasm of intra-abdominal lymph nodes: Secondary | ICD-10-CM | POA: Diagnosis not present

## 2023-09-25 DIAGNOSIS — K5720 Diverticulitis of large intestine with perforation and abscess without bleeding: Principal | ICD-10-CM | POA: Diagnosis present

## 2023-09-25 DIAGNOSIS — E781 Pure hyperglyceridemia: Secondary | ICD-10-CM | POA: Diagnosis not present

## 2023-09-25 DIAGNOSIS — Z79899 Other long term (current) drug therapy: Secondary | ICD-10-CM | POA: Diagnosis not present

## 2023-09-25 DIAGNOSIS — F411 Generalized anxiety disorder: Secondary | ICD-10-CM | POA: Diagnosis not present

## 2023-09-25 DIAGNOSIS — Z8616 Personal history of COVID-19: Secondary | ICD-10-CM

## 2023-09-25 DIAGNOSIS — F909 Attention-deficit hyperactivity disorder, unspecified type: Secondary | ICD-10-CM | POA: Diagnosis present

## 2023-09-25 DIAGNOSIS — K5732 Diverticulitis of large intestine without perforation or abscess without bleeding: Secondary | ICD-10-CM | POA: Diagnosis not present

## 2023-09-25 DIAGNOSIS — R7303 Prediabetes: Secondary | ICD-10-CM | POA: Diagnosis present

## 2023-09-25 HISTORY — PX: COLON RESECTION: SHX5231

## 2023-09-25 HISTORY — PX: PROCTOSCOPY: SHX2266

## 2023-09-25 LAB — HEMOGLOBIN A1C
Hgb A1c MFr Bld: 5.7 % — ABNORMAL HIGH (ref 4.8–5.6)
Mean Plasma Glucose: 116.89 mg/dL

## 2023-09-25 LAB — ABO/RH: ABO/RH(D): O POS

## 2023-09-25 SURGERY — COLECTOMY, SIGMOID, ROBOT-ASSISTED
Anesthesia: General

## 2023-09-25 MED ORDER — OXYCODONE HCL 5 MG PO TABS: 5.0000 mg | ORAL_TABLET | Freq: Once | ORAL | Status: DC | PRN

## 2023-09-25 MED ORDER — DIPHENHYDRAMINE HCL 12.5 MG/5ML PO ELIX: 12.5000 mg | ORAL_SOLUTION | Freq: Four times a day (QID) | ORAL | Status: DC | PRN

## 2023-09-25 MED ORDER — ENSURE PRE-SURGERY PO LIQD
296.0000 mL | Freq: Once | ORAL | Status: DC
  Filled 2023-09-25: qty 296

## 2023-09-25 MED ORDER — PROCHLORPERAZINE EDISYLATE 10 MG/2ML IJ SOLN: 5.0000 mg | Freq: Four times a day (QID) | INTRAMUSCULAR | Status: DC | PRN

## 2023-09-25 MED ORDER — MIDAZOLAM HCL 2 MG/2ML IJ SOLN
INTRAMUSCULAR | Status: AC
  Filled 2023-09-25: qty 2

## 2023-09-25 MED ORDER — MENTHOL 3 MG MT LOZG: 1.0000 | LOZENGE | OROMUCOSAL | Status: DC | PRN

## 2023-09-25 MED ORDER — FENTANYL CITRATE (PF) 100 MCG/2ML IJ SOLN
INTRAMUSCULAR | Status: AC
  Filled 2023-09-25: qty 2

## 2023-09-25 MED ORDER — SODIUM CHLORIDE 0.9 % IR SOLN
Status: DC | PRN
Start: 2023-09-25 — End: 2023-09-25
  Administered 2023-09-25: 1000 mL

## 2023-09-25 MED ORDER — ONDANSETRON HCL 4 MG PO TABS: 4.0000 mg | ORAL_TABLET | Freq: Four times a day (QID) | ORAL | Status: DC | PRN

## 2023-09-25 MED ORDER — KETAMINE HCL 10 MG/ML IJ SOLN
INTRAMUSCULAR | Status: DC | PRN
Start: 2023-09-25 — End: 2023-09-25
  Administered 2023-09-25 (×2): 25 mg via INTRAVENOUS

## 2023-09-25 MED ORDER — SALINE SPRAY 0.65 % NA SOLN
1.0000 | Freq: Four times a day (QID) | NASAL | Status: DC | PRN
  Filled 2023-09-25: qty 44

## 2023-09-25 MED ORDER — DEXMEDETOMIDINE HCL IN NACL 80 MCG/20ML IV SOLN
INTRAVENOUS | Status: DC | PRN
Start: 2023-09-25 — End: 2023-09-25
  Administered 2023-09-25 (×3): 4 ug via INTRAVENOUS

## 2023-09-25 MED ORDER — ONDANSETRON HCL 4 MG/2ML IJ SOLN
INTRAMUSCULAR | Status: DC | PRN
  Administered 2023-09-25: 4 mg via INTRAVENOUS

## 2023-09-25 MED ORDER — FENTANYL CITRATE (PF) 100 MCG/2ML IJ SOLN
INTRAMUSCULAR | Status: DC | PRN
  Administered 2023-09-25: 100 ug via INTRAVENOUS
  Administered 2023-09-25 (×2): 50 ug via INTRAVENOUS

## 2023-09-25 MED ORDER — LACTATED RINGERS IR SOLN
Status: DC | PRN
Start: 2023-09-25 — End: 2023-09-25
  Administered 2023-09-25: 2000 mL

## 2023-09-25 MED ORDER — METRONIDAZOLE 500 MG PO TABS: 1000.0000 mg | ORAL_TABLET | ORAL | Status: DC

## 2023-09-25 MED ORDER — ROCURONIUM BROMIDE 100 MG/10ML IV SOLN
INTRAVENOUS | Status: DC | PRN
  Administered 2023-09-25 (×2): 30 mg via INTRAVENOUS
  Administered 2023-09-25: 5 mg via INTRAVENOUS
  Administered 2023-09-25: 70 mg via INTRAVENOUS

## 2023-09-25 MED ORDER — ALVIMOPAN 12 MG PO CAPS
12.0000 mg | ORAL_CAPSULE | Freq: Two times a day (BID) | ORAL | Status: DC
  Administered 2023-09-26: 12 mg via ORAL
  Filled 2023-09-25: qty 1

## 2023-09-25 MED ORDER — METHOCARBAMOL 1000 MG/10ML IJ SOLN
1000.0000 mg | Freq: Four times a day (QID) | INTRAVENOUS | Status: DC | PRN
  Filled 2023-09-25: qty 10

## 2023-09-25 MED ORDER — SPY AGENT GREEN - (INDOCYANINE FOR INJECTION)
INTRAMUSCULAR | Status: DC | PRN
  Administered 2023-09-25: 6.25 mL via INTRAVENOUS

## 2023-09-25 MED ORDER — CELECOXIB 200 MG PO CAPS
200.0000 mg | ORAL_CAPSULE | ORAL | Status: AC
  Administered 2023-09-25: 200 mg via ORAL
  Filled 2023-09-25: qty 1

## 2023-09-25 MED ORDER — ACETAMINOPHEN 500 MG PO TABS
1000.0000 mg | ORAL_TABLET | ORAL | Status: AC
  Administered 2023-09-25: 1000 mg via ORAL
  Filled 2023-09-25: qty 2

## 2023-09-25 MED ORDER — ZOLPIDEM TARTRATE 5 MG PO TABS: 5.0000 mg | ORAL_TABLET | Freq: Every evening | ORAL | Status: DC | PRN

## 2023-09-25 MED ORDER — SODIUM CHLORIDE 0.9 % IV SOLN
2.0000 g | Freq: Two times a day (BID) | INTRAVENOUS | Status: AC
  Administered 2023-09-25: 2 g via INTRAVENOUS
  Filled 2023-09-25: qty 2

## 2023-09-25 MED ORDER — ALVIMOPAN 12 MG PO CAPS
12.0000 mg | ORAL_CAPSULE | ORAL | Status: AC
  Administered 2023-09-25: 12 mg via ORAL
  Filled 2023-09-25: qty 1

## 2023-09-25 MED ORDER — ENSURE SURGERY PO LIQD
237.0000 mL | Freq: Two times a day (BID) | ORAL | Status: DC
  Administered 2023-09-26 (×2): 237 mL via ORAL

## 2023-09-25 MED ORDER — LIDOCAINE 2% (20 MG/ML) 5 ML SYRINGE
INTRAMUSCULAR | Status: DC | PRN
  Administered 2023-09-25: 1.5 mg/kg/h via INTRAVENOUS

## 2023-09-25 MED ORDER — METHOCARBAMOL 500 MG PO TABS: 1000.0000 mg | ORAL_TABLET | Freq: Four times a day (QID) | ORAL | Status: DC | PRN

## 2023-09-25 MED ORDER — SUGAMMADEX SODIUM 200 MG/2ML IV SOLN
INTRAVENOUS | Status: DC | PRN
Start: 2023-09-25 — End: 2023-09-25
  Administered 2023-09-25: 200 mg via INTRAVENOUS

## 2023-09-25 MED ORDER — OXYCODONE HCL 5 MG PO TABS
5.0000 mg | ORAL_TABLET | ORAL | Status: DC | PRN
  Administered 2023-09-25 – 2023-09-27 (×5): 10 mg via ORAL
  Filled 2023-09-25 (×5): qty 2

## 2023-09-25 MED ORDER — LIDOCAINE HCL (PF) 2 % IJ SOLN
INTRAMUSCULAR | Status: DC | PRN
Start: 2023-09-25 — End: 2023-09-25

## 2023-09-25 MED ORDER — OXYCODONE HCL 5 MG/5ML PO SOLN: 5.0000 mg | Freq: Once | ORAL | Status: DC | PRN

## 2023-09-25 MED ORDER — MIDAZOLAM HCL 5 MG/5ML IJ SOLN
INTRAMUSCULAR | Status: DC | PRN
  Administered 2023-09-25: 2 mg via INTRAVENOUS

## 2023-09-25 MED ORDER — BUPIVACAINE-EPINEPHRINE 0.25% -1:200000 IJ SOLN
INTRAMUSCULAR | Status: AC
  Filled 2023-09-25: qty 1

## 2023-09-25 MED ORDER — DIPHENHYDRAMINE HCL 50 MG/ML IJ SOLN: 12.5000 mg | Freq: Four times a day (QID) | INTRAMUSCULAR | Status: DC | PRN

## 2023-09-25 MED ORDER — PROPOFOL 10 MG/ML IV BOLUS
INTRAVENOUS | Status: AC
  Filled 2023-09-25: qty 20

## 2023-09-25 MED ORDER — NAPHAZOLINE-GLYCERIN 0.012-0.25 % OP SOLN
1.0000 [drp] | Freq: Four times a day (QID) | OPHTHALMIC | Status: DC | PRN
  Filled 2023-09-25: qty 15

## 2023-09-25 MED ORDER — ENSURE PRE-SURGERY PO LIQD
592.0000 mL | Freq: Once | ORAL | Status: DC
  Filled 2023-09-25: qty 592

## 2023-09-25 MED ORDER — CALCIUM POLYCARBOPHIL 625 MG PO TABS
625.0000 mg | ORAL_TABLET | Freq: Two times a day (BID) | ORAL | Status: DC
  Administered 2023-09-25 – 2023-09-27 (×4): 625 mg via ORAL
  Filled 2023-09-25 (×4): qty 1

## 2023-09-25 MED ORDER — ACETAMINOPHEN 500 MG PO TABS
1000.0000 mg | ORAL_TABLET | Freq: Four times a day (QID) | ORAL | Status: DC
  Administered 2023-09-25 – 2023-09-27 (×7): 1000 mg via ORAL
  Filled 2023-09-25 (×8): qty 2

## 2023-09-25 MED ORDER — PHENYLEPHRINE HCL-NACL 20-0.9 MG/250ML-% IV SOLN
INTRAVENOUS | Status: DC | PRN
Start: 2023-09-25 — End: 2023-09-25
  Administered 2023-09-25: 20 ug/min via INTRAVENOUS

## 2023-09-25 MED ORDER — PROPOFOL 10 MG/ML IV BOLUS
INTRAVENOUS | Status: DC | PRN
  Administered 2023-09-25: 200 mg via INTRAVENOUS

## 2023-09-25 MED ORDER — BUPIVACAINE LIPOSOME 1.3 % IJ SUSP: 20.0000 mL | Freq: Once | INTRAMUSCULAR | Status: DC

## 2023-09-25 MED ORDER — BUPIVACAINE LIPOSOME 1.3 % IJ SUSP
INTRAMUSCULAR | Status: AC
  Filled 2023-09-25: qty 20

## 2023-09-25 MED ORDER — BUPROPION HCL ER (XL) 150 MG PO TB24
150.0000 mg | ORAL_TABLET | Freq: Every day | ORAL | Status: DC
  Administered 2023-09-26 – 2023-09-27 (×2): 150 mg via ORAL
  Filled 2023-09-25 (×2): qty 1

## 2023-09-25 MED ORDER — ONDANSETRON HCL 4 MG/2ML IJ SOLN: 4.0000 mg | Freq: Four times a day (QID) | INTRAMUSCULAR | Status: DC | PRN

## 2023-09-25 MED ORDER — ORAL CARE MOUTH RINSE: 15.0000 mL | Freq: Once | OROMUCOSAL | Status: AC

## 2023-09-25 MED ORDER — SODIUM CHLORIDE 0.9 % IV SOLN
2.0000 g | INTRAVENOUS | Status: AC
  Administered 2023-09-25: 2 g via INTRAVENOUS
  Filled 2023-09-25: qty 2

## 2023-09-25 MED ORDER — LACTATED RINGERS IV SOLN: INTRAVENOUS | Status: DC | PRN

## 2023-09-25 MED ORDER — FENTANYL CITRATE PF 50 MCG/ML IJ SOSY: 25.0000 ug | PREFILLED_SYRINGE | INTRAMUSCULAR | Status: DC | PRN

## 2023-09-25 MED ORDER — LIDOCAINE HCL 2 % IJ SOLN
INTRAMUSCULAR | Status: AC
  Filled 2023-09-25: qty 20

## 2023-09-25 MED ORDER — ENOXAPARIN SODIUM 40 MG/0.4ML IJ SOSY
40.0000 mg | PREFILLED_SYRINGE | INTRAMUSCULAR | Status: DC
  Administered 2023-09-26: 40 mg via SUBCUTANEOUS
  Filled 2023-09-25 (×2): qty 0.4

## 2023-09-25 MED ORDER — POTASSIUM CHLORIDE 2 MEQ/ML IV SOLN
INTRAVENOUS | Status: DC
  Filled 2023-09-25: qty 1000

## 2023-09-25 MED ORDER — NEOMYCIN SULFATE 500 MG PO TABS: 1000.0000 mg | ORAL_TABLET | ORAL | Status: DC

## 2023-09-25 MED ORDER — STERILE WATER FOR INJECTION IJ SOLN
INTRAMUSCULAR | Status: DC | PRN
  Administered 2023-09-25: 15 mL

## 2023-09-25 MED ORDER — DULOXETINE HCL 60 MG PO CPEP
90.0000 mg | ORAL_CAPSULE | Freq: Every day | ORAL | Status: DC
  Administered 2023-09-25 – 2023-09-27 (×3): 90 mg via ORAL
  Filled 2023-09-25 (×3): qty 1

## 2023-09-25 MED ORDER — ALUM & MAG HYDROXIDE-SIMETH 200-200-20 MG/5ML PO SUSP: 30.0000 mL | Freq: Four times a day (QID) | ORAL | Status: DC | PRN

## 2023-09-25 MED ORDER — PHENOL 1.4 % MT LIQD: 2.0000 | OROMUCOSAL | Status: DC | PRN

## 2023-09-25 MED ORDER — ENOXAPARIN SODIUM 40 MG/0.4ML IJ SOSY
40.0000 mg | PREFILLED_SYRINGE | Freq: Once | INTRAMUSCULAR | Status: AC
  Administered 2023-09-25: 40 mg via SUBCUTANEOUS
  Filled 2023-09-25: qty 0.4

## 2023-09-25 MED ORDER — GABAPENTIN 100 MG PO CAPS
200.0000 mg | ORAL_CAPSULE | ORAL | Status: AC
  Administered 2023-09-25: 200 mg via ORAL
  Filled 2023-09-25: qty 2

## 2023-09-25 MED ORDER — MELATONIN 3 MG PO TABS: 3.0000 mg | ORAL_TABLET | Freq: Every evening | ORAL | Status: DC | PRN

## 2023-09-25 MED ORDER — MAGIC MOUTHWASH
15.0000 mL | Freq: Four times a day (QID) | ORAL | Status: DC | PRN
  Filled 2023-09-25: qty 15

## 2023-09-25 MED ORDER — STERILE WATER FOR INJECTION IJ SOLN
INTRAMUSCULAR | Status: AC
  Filled 2023-09-25: qty 10

## 2023-09-25 MED ORDER — LACTATED RINGERS IV SOLN: Freq: Three times a day (TID) | INTRAVENOUS | Status: DC | PRN

## 2023-09-25 MED ORDER — HYDROMORPHONE HCL 1 MG/ML IJ SOLN
0.5000 mg | INTRAMUSCULAR | Status: DC | PRN
  Administered 2023-09-25 (×2): 1 mg via INTRAVENOUS
  Filled 2023-09-25 (×2): qty 1

## 2023-09-25 MED ORDER — 0.9 % SODIUM CHLORIDE (POUR BTL) OPTIME
TOPICAL | Status: DC | PRN
Start: 2023-09-25 — End: 2023-09-25
  Administered 2023-09-25: 1000 mL

## 2023-09-25 MED ORDER — DROPERIDOL 2.5 MG/ML IJ SOLN: 0.6250 mg | Freq: Once | INTRAMUSCULAR | Status: DC | PRN

## 2023-09-25 MED ORDER — OXYCODONE HCL 5 MG PO TABS: 5.0000 mg | ORAL_TABLET | Freq: Four times a day (QID) | ORAL | 0 refills | Status: DC | PRN

## 2023-09-25 MED ORDER — BUPIVACAINE-EPINEPHRINE (PF) 0.25% -1:200000 IJ SOLN
INTRAMUSCULAR | Status: DC | PRN
  Administered 2023-09-25: 70 mL

## 2023-09-25 MED ORDER — HYDRALAZINE HCL 20 MG/ML IJ SOLN: 10.0000 mg | INTRAMUSCULAR | Status: DC | PRN

## 2023-09-25 MED ORDER — MELATONIN 3 MG PO TABS
3.0000 mg | ORAL_TABLET | Freq: Every evening | ORAL | Status: DC | PRN
  Administered 2023-09-25 – 2023-09-26 (×2): 3 mg via ORAL
  Filled 2023-09-25 (×2): qty 1

## 2023-09-25 MED ORDER — SIMETHICONE 80 MG PO CHEW: 40.0000 mg | CHEWABLE_TABLET | Freq: Four times a day (QID) | ORAL | Status: DC | PRN

## 2023-09-25 MED ORDER — LACTATED RINGERS IV SOLN: INTRAVENOUS | Status: DC

## 2023-09-25 MED ORDER — KETAMINE HCL 50 MG/5ML IJ SOSY
PREFILLED_SYRINGE | INTRAMUSCULAR | Status: AC
  Filled 2023-09-25: qty 5

## 2023-09-25 MED ORDER — CHLORHEXIDINE GLUCONATE 0.12 % MT SOLN
15.0000 mL | Freq: Once | OROMUCOSAL | Status: AC
  Administered 2023-09-25: 15 mL via OROMUCOSAL

## 2023-09-25 MED ORDER — PROCHLORPERAZINE MALEATE 10 MG PO TABS
10.0000 mg | ORAL_TABLET | Freq: Four times a day (QID) | ORAL | Status: DC | PRN
  Filled 2023-09-25: qty 1

## 2023-09-25 MED ORDER — METOPROLOL TARTRATE 5 MG/5ML IV SOLN: 5.0000 mg | Freq: Four times a day (QID) | INTRAVENOUS | Status: DC | PRN

## 2023-09-25 SURGICAL SUPPLY — 112 items
ADAPTER GOLDBERG URETERAL (ADAPTER) IMPLANT
ADPR CATH 15X14FR FL DRN BG (ADAPTER)
APL PRP STRL LF DISP 70% ISPRP (MISCELLANEOUS)
APPLIER CLIP 5 13 M/L LIGAMAX5 (MISCELLANEOUS)
APPLIER CLIP ROT 10 11.4 M/L (STAPLE)
APR CLP MED LRG 11.4X10 (STAPLE)
APR CLP MED LRG 5 ANG JAW (MISCELLANEOUS)
BAG COUNTER SPONGE SURGICOUNT (BAG) ×1 IMPLANT
BAG SPNG CNTER NS LX DISP (BAG) ×1
BAG URO CATCHER STRL LF (MISCELLANEOUS) ×1 IMPLANT
BLADE EXTENDED COATED 6.5IN (ELECTRODE) IMPLANT
CANNULA REDUCER 12-8 DVNC XI (CANNULA) IMPLANT
CATH URETL OPEN 5X70 (CATHETERS) IMPLANT
CHLORAPREP W/TINT 26 (MISCELLANEOUS) IMPLANT
CLIP APPLIE 5 13 M/L LIGAMAX5 (MISCELLANEOUS) IMPLANT
CLIP APPLIE ROT 10 11.4 M/L (STAPLE) IMPLANT
CLOTH BEACON ORANGE TIMEOUT ST (SAFETY) ×1 IMPLANT
COVER SURGICAL LIGHT HANDLE (MISCELLANEOUS) ×2 IMPLANT
COVER TIP SHEARS 8 DVNC (MISCELLANEOUS) ×1 IMPLANT
DEVICE TROCAR PUNCTURE CLOSURE (ENDOMECHANICALS) IMPLANT
DRAIN CHANNEL 19F RND (DRAIN) IMPLANT
DRAPE ARM DVNC X/XI (DISPOSABLE) ×4 IMPLANT
DRAPE COLUMN DVNC XI (DISPOSABLE) ×1 IMPLANT
DRAPE SURG IRRIG POUCH 19X23 (DRAPES) ×1 IMPLANT
DRIVER NDL LRG 8 DVNC XI (INSTRUMENTS) ×1 IMPLANT
DRIVER NDLE LRG 8 DVNC XI (INSTRUMENTS)
DRSG OPSITE POSTOP 4X10 (GAUZE/BANDAGES/DRESSINGS) IMPLANT
DRSG OPSITE POSTOP 4X6 (GAUZE/BANDAGES/DRESSINGS) IMPLANT
DRSG OPSITE POSTOP 4X8 (GAUZE/BANDAGES/DRESSINGS) IMPLANT
DRSG TEGADERM 2-3/8X2-3/4 SM (GAUZE/BANDAGES/DRESSINGS) ×5 IMPLANT
DRSG TEGADERM 4X4.75 (GAUZE/BANDAGES/DRESSINGS) IMPLANT
ELECT PENCIL ROCKER SW 15FT (MISCELLANEOUS) ×1 IMPLANT
ELECT REM PT RETURN 15FT ADLT (MISCELLANEOUS) ×1 IMPLANT
ENDOLOOP SUT PDS II 0 18 (SUTURE) IMPLANT
EVACUATOR SILICONE 100CC (DRAIN) IMPLANT
FORCEPS BPLR FENES DVNC XI (FORCEP) IMPLANT
GAUZE SPONGE 2X2 8PLY STRL LF (GAUZE/BANDAGES/DRESSINGS) ×1 IMPLANT
GLOVE ECLIPSE 8.0 STRL XLNG CF (GLOVE) ×3 IMPLANT
GLOVE INDICATOR 8.0 STRL GRN (GLOVE) ×3 IMPLANT
GLOVE SURG LX STRL 7.5 STRW (GLOVE) ×1 IMPLANT
GOWN SRG XL LVL 4 BRTHBL STRL (GOWNS) ×1 IMPLANT
GOWN STRL NON-REIN XL LVL4 (GOWNS) ×1
GOWN STRL REUS W/ TWL LRG LVL3 (GOWN DISPOSABLE) ×1 IMPLANT
GOWN STRL REUS W/ TWL XL LVL3 (GOWN DISPOSABLE) ×4 IMPLANT
GOWN STRL REUS W/TWL LRG LVL3 (GOWN DISPOSABLE) ×1
GOWN STRL REUS W/TWL XL LVL3 (GOWN DISPOSABLE) ×4
GRASPER SUT TROCAR 14GX15 (MISCELLANEOUS) IMPLANT
GRASPER TIP-UP FEN DVNC XI (INSTRUMENTS) ×1 IMPLANT
GUIDEWIRE ANG ZIPWIRE 038X150 (WIRE) IMPLANT
GUIDEWIRE STR DUAL SENSOR (WIRE) IMPLANT
HOLDER FOLEY CATH W/STRAP (MISCELLANEOUS) ×1 IMPLANT
IRRIG SUCT STRYKERFLOW 2 WTIP (MISCELLANEOUS) ×1
IRRIGATION SUCT STRKRFLW 2 WTP (MISCELLANEOUS) ×1 IMPLANT
KIT PROCEDURE DVNC SI (MISCELLANEOUS) ×1 IMPLANT
KIT SIGMOIDOSCOPE (SET/KITS/TRAYS/PACK) IMPLANT
KIT TURNOVER KIT A (KITS) IMPLANT
MANIFOLD NEPTUNE II (INSTRUMENTS) ×1 IMPLANT
NDL INSUFFLATION 14GA 120MM (NEEDLE) ×1 IMPLANT
NEEDLE INSUFFLATION 14GA 120MM (NEEDLE) ×1
PACK CARDIOVASCULAR III (CUSTOM PROCEDURE TRAY) ×1 IMPLANT
PACK COLON (CUSTOM PROCEDURE TRAY) ×1 IMPLANT
PACK CYSTO (CUSTOM PROCEDURE TRAY) ×1 IMPLANT
PAD POSITIONING PINK XL (MISCELLANEOUS) ×1 IMPLANT
PAD PREP 24X48 CUFFED NSTRL (MISCELLANEOUS) ×1 IMPLANT
PROTECTOR NERVE ULNAR (MISCELLANEOUS) ×2 IMPLANT
RELOAD STAPLE 45 3.5 BLU DVNC (STAPLE) IMPLANT
RELOAD STAPLE 45 4.3 GRN DVNC (STAPLE) IMPLANT
RELOAD STAPLE 60 3.5 BLU DVNC (STAPLE) IMPLANT
RELOAD STAPLE 60 4.3 GRN DVNC (STAPLE) IMPLANT
RETRACTOR WND ALEXIS 18 MED (MISCELLANEOUS) IMPLANT
RTRCTR WOUND ALEXIS 18CM MED (MISCELLANEOUS)
SCISSORS LAP 5X35 DISP (ENDOMECHANICALS) ×1 IMPLANT
SCISSORS MNPLR CVD DVNC XI (INSTRUMENTS) ×1 IMPLANT
SEAL UNIV 5-12 XI (MISCELLANEOUS) ×4 IMPLANT
SEALER VESSEL EXT DVNC XI (MISCELLANEOUS) ×1 IMPLANT
SOL ELECTROSURG ANTI STICK (MISCELLANEOUS) ×1
SOLUTION ELECTROSURG ANTI STCK (MISCELLANEOUS) ×1 IMPLANT
SPIKE FLUID TRANSFER (MISCELLANEOUS) ×1 IMPLANT
STAPLER 45 SUREFORM DVNC (STAPLE) IMPLANT
STAPLER 60 SUREFORM DVNC (STAPLE) IMPLANT
STAPLER ECHELON POWER CIR 29 (STAPLE) IMPLANT
STAPLER ECHELON POWER CIR 31 (STAPLE) IMPLANT
STAPLER RELOAD 3.5X45 BLU DVNC (STAPLE)
STAPLER RELOAD 3.5X60 BLU DVNC (STAPLE)
STAPLER RELOAD 4.3X45 GRN DVNC (STAPLE)
STAPLER RELOAD 4.3X60 GRN DVNC (STAPLE) ×2
STOPCOCK 4 WAY LG BORE MALE ST (IV SETS) ×2 IMPLANT
SURGILUBE 2OZ TUBE FLIPTOP (MISCELLANEOUS) IMPLANT
SUT MNCRL AB 4-0 PS2 18 (SUTURE) ×1 IMPLANT
SUT PDS AB 1 CT1 27 (SUTURE) ×2 IMPLANT
SUT PROLENE 0 CT 2 (SUTURE) IMPLANT
SUT PROLENE 2 0 KS (SUTURE) IMPLANT
SUT PROLENE 2 0 SH DA (SUTURE) IMPLANT
SUT SILK 2 0 SH CR/8 (SUTURE) IMPLANT
SUT SILK 3 0 SH CR/8 (SUTURE) ×1 IMPLANT
SUT V-LOC BARB 180 2/0GR6 GS22 (SUTURE)
SUT VIC AB 3-0 SH 18 (SUTURE) IMPLANT
SUT VIC AB 3-0 SH 27 (SUTURE) ×1
SUT VIC AB 3-0 SH 27XBRD (SUTURE) IMPLANT
SUT VICRYL 0 UR6 27IN ABS (SUTURE) IMPLANT
SUTURE V-LC BRB 180 2/0GR6GS22 (SUTURE) IMPLANT
SYR 20ML ECCENTRIC (SYRINGE) ×1 IMPLANT
SYS LAPSCP GELPORT 120MM (MISCELLANEOUS)
SYS WOUND ALEXIS 18CM MED (MISCELLANEOUS) ×1
SYSTEM LAPSCP GELPORT 120MM (MISCELLANEOUS) IMPLANT
SYSTEM WOUND ALEXIS 18CM MED (MISCELLANEOUS) ×1 IMPLANT
TOWEL OR NON WOVEN STRL DISP B (DISPOSABLE) ×1 IMPLANT
TRAY FOLEY MTR SLVR 16FR STAT (SET/KITS/TRAYS/PACK) ×1 IMPLANT
TROCAR ADV FIXATION 5X100MM (TROCAR) ×1 IMPLANT
TUBING CONNECTING 10 (TUBING) ×3 IMPLANT
TUBING INSUFFLATION 10FT LAP (TUBING) ×1 IMPLANT
TUBING UROLOGY SET (TUBING) IMPLANT

## 2023-09-25 NOTE — Anesthesia Postprocedure Evaluation (Signed)
Anesthesia Post Note  Patient: Cory Gomez  Procedure(s) Performed: ROBOTIC RESECTION OF SIGMOID COLON AND INTRAOPERATIVE ASSESSMENT OF PERFUSION USING FIREFLY DYE RIGID PROCTOSCOPY CYSTOSCOPY with FIREFLY INJECTION     Patient location during evaluation: PACU Anesthesia Type: General Level of consciousness: awake and alert Pain management: pain level controlled Vital Signs Assessment: post-procedure vital signs reviewed and stable Respiratory status: spontaneous breathing, nonlabored ventilation, respiratory function stable and patient connected to nasal cannula oxygen Cardiovascular status: blood pressure returned to baseline and stable Postop Assessment: no apparent nausea or vomiting Anesthetic complications: no   No notable events documented.  Last Vitals:  Vitals:   09/25/23 1420 09/25/23 1508  BP: 125/86 124/84  Pulse: 86 86  Resp: 16 16  Temp: 36.6 C 36.7 C  SpO2: 98% 98%    Last Pain:  Vitals:   09/25/23 1508  TempSrc: Oral  PainSc:                  Nelle Don Lunah Losasso

## 2023-09-25 NOTE — H&P (Signed)
09/25/2023   Cory Gomez [id number] 10-31-1981   CARE TEAM: PCP: [name], MD   Outpatient Care Team: Patient Care Team: [name], MD as PCP - General (Family Medicine) Karie Soda, MD as Consulting Physician (General Surgery)   Inpatient Treatment Team: Treatment Team:  Rhetta Mura, MD Ccs, Md, MD Merlyn Albert, MD Redmond Baseman, RN Dollard, Lynford Citizen, RN Mahat, Sarala, RN Erlenmeyer, Raymon Mutton, RN     Problem List:    Principal Problem:   Perforated sigmoid colon [facility]) Active Problems:   Prediabetes   ADD (attention deficit disorder)   GAD (generalized anxiety disorder)   Hypertension   Obesity, Class II, BMI 35-39.9   Abscess of sigmoid colon     * No surgery found *           Assessment [facility] Stay = 7 days)       Complex diverticulitis with abscess gradually improving with IV antibiotics.  Plan:   Given the severe complexity this prolonged hospital stays and readmissions I think he would benefit from segmental colectomy.  Robotic approach.  Scheduled today The anatomy & physiology of the digestive tract was discussed.  The pathophysiology of the colon was discussed.  Natural history risks without surgery was discussed.   I feel the risks of no intervention will lead to serious problems that outweigh the operative risks; therefore, I recommended a partial colectomy to remove the pathology.  Minimally invasive (Robotic/Laparoscopic) & open techniques were discussed.   Risks such as bleeding, infection, abscess, leak, reoperation, injury to other organs, need for repair of tissues / organs, possible ostomy, hernia, heart attack, stroke, death, and other risks were discussed.  I noted a good likelihood this will help address the problem.   Goals of post-operative recovery were discussed as well.   Need for adequate nutrition, daily bowel regimen and healthy physical activity, to optimize recovery was noted as well. We will work  to minimize complications.  Educational materials were available as well.  Questions were answered.  The patient expresses understanding & wishes to proceed with surgery.         I reviewed nursing notes, hospitalist notes, last 24 h vitals and pain scores, last 48 h intake and output, last 24 h labs and trends, and last 24 h imaging results.  I have reviewed this patient's available data, including medical history, events of note, test results, etc as part of my evaluation.   A significant portion of that time was spent in counseling. Care during the described time interval was provided by me.   This care required moderate level of medical decision making.  07/13/2023       Subjective: (Chief complaint)   Colonoscopy attempted and unable be completed due to fair prep.  No obvious mass seen at strictured area of rectosigmoid  Patient with repeat prep and oral antibiotics given.  Ready for surgery.   Objective:   Vital signs:         Vitals:    07/12/23 0636 07/12/23 1303 07/12/23 2051 07/13/23 0533  BP: (!) 145/85 138/85 130/88 (!) 144/90  Pulse: 89 92 78 84  Resp: 18 18 18 18   Temp: 98.7 F (37.1 C) 98.4 F (36.9 C) 98 F (36.7 C) 98.1 F (36.7 C)  TempSrc: Oral   Oral Oral  SpO2: 98% 98% 98% 98%  Weight:          Height:  Last BM Date : 07/12/23   Intake/Output    Yesterday:              07/19 0701 - 07/20 0700 In: 1560.6 [P.O.:240; I.V.:1199; IV Piggyback:121.6] Out: 500 [Urine:500] This shift:              No intake/output data recorded.   Bowel function:              Flatus: YES              BM:  YES              Drain: (No drain)     Physical Exam:   General: Pt awake/alert in no acute distress.  Being on phone.  Sitting up calm and relaxed and smiling. Eyes: PERRL, normal EOM.  Sclera clear.  No icterus Neuro: CN II-XII intact w/o focal sensory/motor deficits. Lymph: No head/neck/groin lymphadenopathy Psych:  No  delerium/psychosis/paranoia.  Oriented x 4 HENT: Normocephalic, Mucus membranes moist.  No thrush Neck: Supple, No tracheal deviation.  No obvious thyromegaly Chest: No pain to chest wall compression.  Good respiratory excursion.  No audible wheezing CV:  Pulses intact.  Regular rhythm.  No major extremity edema MS: Normal AROM mjr joints.  No obvious deformity   Abdomen: Soft.  Nondistended.  Nontender.  No evidence of peritonitis.  No incarcerated hernias.   Ext:  No deformity.  No mjr edema.  No cyanosis Skin: No petechiae / purpurea.  No major sores.  Warm and dry       Results:    Cultures:        Recent Results (from the past 720 hour(s))  SARS Coronavirus 2 by RT PCR (hospital order, performed in [facility] hospital lab) *cepheid single result test* Anterior Nasal Swab     Status: None    Collection Time: 07/06/23 12:36 PM    Specimen: Anterior Nasal Swab  Result Value Ref Range Status    SARS Coronavirus 2 by RT PCR NEGATIVE NEGATIVE Final      Comment: (NOTE) SARS-CoV-2 target nucleic acids are NOT DETECTED.   The SARS-CoV-2 RNA is generally detectable in upper and lower respiratory specimens during the acute phase of infection. The lowest concentration of SARS-CoV-2 viral copies this assay can detect is 250 copies / mL. A negative result does not preclude SARS-CoV-2 infection and should not be used as the sole basis for treatment or other patient management decisions.  A negative result may occur with improper specimen collection / handling, submission of specimen other than nasopharyngeal swab, presence of viral mutation(s) within the areas targeted by this assay, and inadequate number of viral copies (<250 copies / mL). A negative result must be combined with clinical observations, patient history, and epidemiological information.   Fact Sheet for Patients:   RoadLapTop.co.za   Fact Sheet for Healthcare  Providers: http://kim-miller.com/   This test is not yet approved or  cleared by the [place] FDA and has been authorized for detection and/or diagnosis of SARS-CoV-2 by FDA under an Emergency Use Authorization (EUA).  This EUA will remain in effect (meaning this test can be used) for the duration of the COVID-19 declaration under Section 564(b)(1) of the Act, 21 U.S.C. section 360bbb-3(b)(1), unless the authorization is terminated or revoked sooner.   Performed at [facility], [address] [place], [place] [address]        Labs: Lab Results Last 48 Hours  Results for orders placed or performed during the hospital encounter of 07/06/23 (from the past 48 hour(s))  CBC     Status: Abnormal    Collection Time: 07/12/23  5:22 AM  Result Value Ref Range    WBC 10.2 4.0 - 10.5 K/uL    RBC 3.84 (L) 4.22 - 5.81 MIL/uL    Hemoglobin 9.9 (L) 13.0 - 17.0 g/dL    HCT 93.2 (L) 35.5 - 52.0 %    MCV 83.3 80.0 - 100.0 fL    MCH 25.8 (L) 26.0 - 34.0 pg    MCHC 30.9 30.0 - 36.0 g/dL    RDW 73.2 20.2 - 54.2 %    Platelets 391 150 - 400 K/uL    nRBC 0.0 0.0 - 0.2 %      Comment: Performed at [facility], [address] [place], [place] [address]  Basic metabolic panel     Status: Abnormal    Collection Time: 07/12/23  5:22 AM  Result Value Ref Range    Sodium 133 (L) 135 - 145 mmol/L    Potassium 3.8 3.5 - 5.1 mmol/L    Chloride 102 98 - 111 mmol/L    CO2 22 22 - 32 mmol/L    Glucose, Bld 85 70 - 99 mg/dL      Comment: Glucose reference range applies only to samples taken after fasting for at least 8 hours.    BUN 11 6 - 20 mg/dL    Creatinine, Ser 7.62 0.61 - 1.24 mg/dL    Calcium 8.5 (L) 8.9 - 10.3 mg/dL    GFR, Estimated >83 >15 mL/min      Comment: (NOTE) Calculated using the CKD-EPI Creatinine Equation (2021)      Anion gap 9 5 - 15      Comment: Performed at [facility], [address],  [place], [place] [address]  CBC     Status: Abnormal    Collection Time: 07/13/23  4:37 AM  Result Value Ref Range    WBC 7.0 4.0 - 10.5 K/uL    RBC 3.67 (L) 4.22 - 5.81 MIL/uL    Hemoglobin 9.3 (L) 13.0 - 17.0 g/dL    HCT 07.3 (L) 71.0 - 52.0 %    MCV 83.4 80.0 - 100.0 fL    MCH 25.3 (L) 26.0 - 34.0 pg    MCHC 30.4 30.0 - 36.0 g/dL    RDW 62.6 94.8 - 54.6 %    Platelets 382 150 - 400 K/uL    nRBC 0.0 0.0 - 0.2 %      Comment: Performed at [facility], [address], [place], [place] [address]  Basic metabolic panel     Status: Abnormal    Collection Time: 07/13/23  4:37 AM  Result Value Ref Range    Sodium 138 135 - 145 mmol/L    Potassium 3.7 3.5 - 5.1 mmol/L    Chloride 104 98 - 111 mmol/L    CO2 24 22 - 32 mmol/L    Glucose, Bld 91 70 - 99 mg/dL      Comment: Glucose reference range applies only to samples taken after fasting for at least 8 hours.    BUN 10 6 - 20 mg/dL    Creatinine, Ser 0.09 0.61 - 1.24 mg/dL    Calcium 8.6 (L) 8.9 - 10.3 mg/dL    GFR, Estimated >38 >18 mL/min      Comment: (NOTE) Calculated using the CKD-EPI  Creatinine Equation (2021)      Anion gap 10 5 - 15      Comment: Performed at [facility], [address] [place], [place] [address]        Imaging / Studies:  Imaging Results (Last 48 hours)  CT ABDOMEN PELVIS W CONTRAST   Result Date: 07/11/2023 CLINICAL DATA:  History of perforated sigmoid diverticulitis. EXAM: CT ABDOMEN AND PELVIS WITH CONTRAST TECHNIQUE: Multidetector CT imaging of the abdomen and pelvis was performed using the standard protocol following bolus administration of intravenous contrast. RADIATION DOSE REDUCTION: This exam was performed according to the departmental dose-optimization program which includes automated exposure control, adjustment of the mA and/or kV according to patient size and/or use of iterative reconstruction technique. CONTRAST:  OMNIPAQUE IOHEXOL 300 MG/ML  SOLN  COMPARISON:  July 06, 2023. FINDINGS: Lower chest: No acute abnormality. Hepatobiliary: No focal liver abnormality is seen. Status post cholecystectomy. No biliary dilatation. Pancreas: Unremarkable. No pancreatic ductal dilatation or surrounding inflammatory changes. Spleen: Normal in size without focal abnormality. Adrenals/Urinary Tract: Adrenal glands are unremarkable. Kidneys are normal, without renal calculi, focal lesion, or hydronephrosis. Bladder is unremarkable. Stomach/Bowel: The stomach and appendix are unremarkable. There is no evidence of bowel obstruction. Severe inflammatory changes are again noted involving the sigmoid colon consistent with a history of perforated diverticulitis. 5.1 x 2.8 cm pericolonic abscess is noted again. Vascular/Lymphatic: No significant vascular findings are present. No enlarged abdominal or pelvic lymph nodes. Reproductive: Prostate is unremarkable. Other: No hernia is noted. Musculoskeletal: No acute or significant osseous findings. IMPRESSION: Severe sigmoid diverticulitis is again noted with continued presence of pericolonic abscess consistent with perforation as noted on prior study. There does appear to be increased wall thickening and inflammation involving the more distal sigmoid colon compared to prior exam. Electronically Signed   By: Lupita Raider M.D.   On: 07/11/2023 13:41       Medications / Allergies: per chart   Antibiotics: Anti-infectives (From admission, onward)        Start     Dose/Rate Route Frequency Ordered Stop    07/09/23 0000   amoxicillin-clavulanate (AUGMENTIN) 875-125 MG tablet        1 tablet Oral 2 times daily 07/09/23 0854 07/14/23 2359    07/06/23 2200   piperacillin-tazobactam (ZOSYN) IVPB 3.375 g        3.375 g 12.5 mL/hr over 240 Minutes Intravenous Every 8 hours 07/06/23 1851 07/16/23 2159    07/06/23 1530   piperacillin-tazobactam (ZOSYN) IVPB 3.375 g        3.375 g 100 mL/hr over 30 Minutes Intravenous  Once  07/06/23 1521 07/06/23 1555             Ardeth Sportsman, MD, FACS, MASCRS Esophageal, Gastrointestinal & Colorectal Surgery Robotic and Minimally Invasive Surgery  [facility] [address], [address] [place], [place] [address] [phone] Fax [phone] Main  CONTACT INFORMATION: Weekday (9AM-5PM): Call [facility] main office at [phone] Weeknight (5PM-9AM) or Weekend/Holiday: Check EPIC "Web Links" tab & use "AMION" (password " [email or web address]") for General Surgery [facility] coverage  Please, DO NOT use SecureChat  (it is not reliable communication to reach operating surgeons & will lead to a delay in care).   Epic staff messaging available for outptient concerns needing 1-2 business day response.     09/25/2023

## 2023-09-25 NOTE — Consult Note (Signed)
Urology Consult   Physician requesting consult: Dr. Michaell Cowing  Reason for consult: Ureteral firefly injections  History of Present Illness: Cory Gomez is a 42 y.o. male here today for sigmoid colectomy with Dr. Michaell Cowing due to diverticulitis.   The patient denies a history of voiding or storage urinary symptoms, hematuria, UTIs, STDs, urolithiasis, GU malignancy/trauma/surgery.  Past Medical History:  Diagnosis Date   ADHD (attention deficit hyperactivity disorder)    Annual physical exam 09/09/2019   Anxiety and depression    COVID-19    12/28/19   Depression, recurrent (HCC) 09/09/2019   Elevated liver enzymes 06/11/2019   Excessive ear wax, left 09/09/2019   Headache 12/31/2021   History of kidney stones    Hypertriglyceridemia 07/17/2021   Insomnia    Insomnia    Inverted nipple 09/16/2019   Kidney stone    Mastoiditis of both sides    Nystagmus 01/22/2022   Obesity (BMI 30-39.9)    Obesity (BMI 30-39.9)    Prediabetes    Suicidal ideation 07/14/2021   Vertigo     Past Surgical History:  Procedure Laterality Date   NO PAST SURGERIES      Current Hospital Medications:  Home Meds:  Current Meds  Medication Sig   buPROPion (WELLBUTRIN XL) 150 MG 24 hr tablet Take 1 tablet (150 mg total) by mouth every morning.   DULoxetine (CYMBALTA) 30 MG capsule Take 3 capsules (90 mg total) by mouth daily.   eszopiclone (LUNESTA) 2 MG TABS tablet Take 1 tablet (2 mg total) by mouth at bedtime as needed for sleep. Take immediately before bedtime    Scheduled Meds:  bupivacaine liposome  20 mL Infiltration Once   [START ON 09/26/2023] feeding supplement  296 mL Oral Once   feeding supplement  592 mL Oral Once   Continuous Infusions:  cefoTEtan (CEFOTAN) IV     lactated ringers 10 mL/hr at 09/25/23 0809   PRN Meds:.  Allergies: No Known Allergies  Family History  Problem Relation Age of Onset   Diabetes Mother    Hypertension Mother    Hyperlipidemia Mother     Diabetes Mellitus II Mother    Anxiety disorder Mother    Depression Mother    Stroke Father    Alcoholism Father    Anxiety disorder Brother    Depression Brother    Hypertension Brother    Hyperlipidemia Brother    Diabetes type II Brother    ADD / ADHD Brother    Dementia Maternal Grandfather    Stomach cancer Neg Hx    Colon cancer Neg Hx    Esophageal cancer Neg Hx    Pancreatic cancer Neg Hx     Social History:  reports that he has never smoked. He has never used smokeless tobacco. He reports that he does not drink alcohol and does not use drugs.  ROS: A complete review of systems was performed.  All systems are negative except for pertinent findings as noted.  Physical Exam:  Vital signs in last 24 hours: Temp:  [98.4 F (36.9 C)] 98.4 F (36.9 C) (10/02 0706) Pulse Rate:  [80-93] 93 (10/02 0706) Resp:  [9-18] 16 (10/02 0706) BP: (117-145)/(72-100) 145/98 (10/02 0706) SpO2:  [95 %-98 %] 96 % (10/02 0706) Weight:  [126.6 kg] 126.6 kg (10/02 0703) Constitutional:  Alert and oriented, No acute distress Cardiovascular: Regular rate and rhythm, No JVD Respiratory: Normal respiratory effort, Lungs clear bilaterally GI: Abdomen is soft, nontender, nondistended, no abdominal masses GU:  No CVA tenderness Lymphatic: No lymphadenopathy Neurologic: Grossly intact, no focal deficits Psychiatric: Normal mood and affect  Laboratory Data:  No results for input(s): "WBC", "HGB", "HCT", "PLT" in the last 72 hours.  No results for input(s): "NA", "K", "CL", "GLUCOSE", "BUN", "CALCIUM", "CREATININE" in the last 72 hours.  Invalid input(s): "CO3"   No results found for this or any previous visit (from the past 24 hour(s)). No results found for this or any previous visit (from the past 240 hour(s)).  Renal Function: Recent Labs    09/18/23 1007  CREATININE 0.74   Estimated Creatinine Clearance: 175.3 mL/min (by C-G formula based on SCr of 0.74 mg/dL).  Radiologic  Imaging: No results found.  I independently reviewed the above imaging studies.  Impression/Recommendation 42 year old male with a history of diverticulitis and is undergoing partial colectomy with Dr. Michaell Cowing  The risks, benefits and alternatives of cystoscopy with bilateral ureteral firefly injections was discussed with the patient.  He voices understanding and wishes to proceed.   Rhoderick Moody, MD [facility] 09/25/2023, 8:09 AM

## 2023-09-25 NOTE — Anesthesia Procedure Notes (Signed)
Procedure Name: Intubation Date/Time: 09/25/2023 8:45 AM  Performed by: Sandie Ano, CRNAPre-anesthesia Checklist: Patient identified, Emergency Drugs available, Suction available and Patient being monitored Patient Re-evaluated:Patient Re-evaluated prior to induction Oxygen Delivery Method: Circle System Utilized Preoxygenation: Pre-oxygenation with 100% oxygen Induction Type: IV induction Ventilation: Mask ventilation without difficulty Laryngoscope Size: Mac and 3 Grade View: Grade I Tube type: Oral Tube size: 7.0 mm Number of attempts: 1 Airway Equipment and Method: Stylet and Oral airway Placement Confirmation: ETT inserted through vocal cords under direct vision, positive ETCO2 and breath sounds checked- equal and bilateral Secured at: 23 cm Tube secured with: Tape Dental Injury: Teeth and Oropharynx as per pre-operative assessment

## 2023-09-25 NOTE — Transfer of Care (Signed)
Immediate Anesthesia Transfer of Care Note  Patient: Melanee Left II Howells  Procedure(s) Performed: ROBOTIC RESECTION OF SIGMOID COLON, INTRAOPERATIVE ASSESSMENT OF PERFUSION USING FIREFLY DYE, AND POSSIBLE OSTOMY RIGID PROCTOSCOPY CYSTOSCOPY with FIREFLY INJECTION  Patient Location: PACU  Anesthesia Type:General  Level of Consciousness: awake and oriented  Airway & Oxygen Therapy: Patient Spontanous Breathing and Patient connected to face mask oxygen  Post-op Assessment: Report given to RN and Post -op Vital signs reviewed and stable  Post vital signs: Reviewed and stable  Last Vitals:  Vitals Value Taken Time  BP 135/92 09/25/23 1130  Temp    Pulse 85 09/25/23 1132  Resp 17 09/25/23 1132  SpO2 100 % 09/25/23 1132  Vitals shown include unfiled device data.  Last Pain:  Vitals:   09/25/23 0706  TempSrc: Oral  PainSc:          Complications: No notable events documented.

## 2023-09-25 NOTE — Op Note (Signed)
09/25/2023  11:17 AM  PATIENT:  Cory Gomez  42 y.o. male  Patient Care Team: [name], MD as PCP - General (Family Medicine) Karie Soda, MD as Consulting Physician (General Surgery)  PRE-OPERATIVE DIAGNOSIS: SIGMOID DIVERTICULITIS WITH PERFORATION/ABSCESS  POST-OPERATIVE DIAGNOSIS:  SIGMOID DIVERTICULITIS WITH PARTIAL COLON OBSTRUCTION  PROCEDURE:   -ROBOTIC LOW ANTERIOR RECTOSIGMOID RESECTION -TRANSVERSUS ABDOMINIS PLANE (TAP) BLOCK - BILATERAL -RIGID PROCTOSCOPY -INTRAOPERATIVE ASSESSMENT OF TISSUE VASCULAR PERFUSION USING ICG (indocyanine green)          IMMUNOFLUORESCENCE  SURGEON:  Ardeth Sportsman, MD  ASSISTANT:  Romie Levee, MD  An experienced assistant was required given the standard of surgical care given the complexity of the case.  This assistant was needed for exposure, dissection, suction, tissue approximation, retraction, perception, etc  ANESTHESIA:  General endotracheal intubation anesthesia (GETA) and Regional TRANSVERSUS ABDOMINIS PLANE (TAP) nerve block -BILATERAL for perioperative & postoperative pain control at the level of the transverse abdominis & preperitoneal spaces along the flank at the anterior axillary line, from subcostal ridge to iliac crest under laparoscopic guidance provided with liposomal bupivacaine (Experel) 20mL mixed with 50 mL of bupivicaine 0.25% with epinephrine  Estimated Blood Loss (EBL):   Total I/O In: 1400 [I.V.:1300; IV Piggyback:100] Out: 500 [Urine:400; Blood:100].   (See anesthesia record)  Delay start of Pharmacological VTE agent (>24hrs) due to concerns of significant anemia, surgical blood loss, or risk of bleeding?:  no  DRAINS: (None)  SPECIMEN:  Rectosigmoid (open end proximal) and Distal anastomotic ring (FINAL DISTAL MARGIN)  DISPOSITION OF SPECIMEN:  Pathology  COUNTS:  Sponge, needle, & instrument counts CORRECT  PLAN OF CARE: Admit to inpatient   PATIENT DISPOSITION:  PACU - hemodynamically  stable.  INDICATION:    Young healthy male with episode of diverticulitis with large abscess not amenable drainage.  Eventually stabilized with IV antibiotics and several weeks of oral antibiotics.  Given complex attack and persistent symptoms, I recommended segmental resection:  The anatomy & physiology of the digestive tract was discussed.  The pathophysiology was discussed.  Natural history risks without surgery was discussed.   I worked to give an overview of the disease and the frequent need to have multispecialty involvement.  I feel the risks of no intervention will lead to serious problems that outweigh the operative risks; therefore, I recommended a partial colectomy to remove the pathology.  Laparoscopic & open techniques were discussed.   Risks such as bleeding, infection, abscess, leak, reoperation, possible ostomy, hernia, heart attack, death, and other risks were discussed.  I noted a good likelihood this will help address the problem.   Goals of post-operative recovery were discussed as well.  We will work to minimize complications.  Educational materials on the pathology had been given in the office.  Questions were answered.    The patient expressed understanding & wished to proceed with surgery.  OR FINDINGS:   Patient had very thickened sigmoid colon with corkscrew like twisting and dense adhesions to dome of bladder.  Colon moderately dilated concerning for at least partial or near colon obstruction which correlates with difficulty of attempt at colonoscopy and bowel prep yesterday.  Bowel prep still fair.  No obvious metastatic disease on visceral parietal peritoneum or liver.  It is a 31mm EEA anastomosis ( distal descending colon  connected to proximal rectum.)  It rests 14 cm from the anal verge by rigid proctoscopy.  CASE DATA: Type of patient?: Elective [facility] Private Case Status of Case? Elective Scheduled  Infection Present At Time Of Surgery (PATOS)?   PHLEGMON   DESCRIPTION:   Informed consent was confirmed.  The patient underwent general anaesthesia without difficulty.  The patient was positioned appropriately.  VTE prevention in place.  Patient underwent cystoscopy with insufflation of ICG firefly in both ureters and bladder by Dr. Liliane Shi with [facility].  Please see his separate operative note.  The patient was clipped, prepped, & draped in a sterile fashion.  Surgical timeout confirmed our plan.  The patient was positioned in reverse Trendelenburg.  Abdominal entry was gained using Varess technique at the left subcostal ridge on the anterior abdominal wall.  No elevated EtCO2 noted.  Port placed.  Camera inspection revealed no injury.  Extra ports were carefully placed under direct laparoscopic visualization.  Upon entering the abdomen (organ space),we encountered a phlegmon involving the rectosigmoid colon to the bladder .   I reflected the greater omentum and the upper abdomen the small bowel in the upper abdomen.  The patient was carefully positioned.  The [employer] daVinci robot was docked with camera & instruments carefully placed.  The patient had very thick and tortuous rectosigmoid colon spiraling like a corkscrew to the anterior pelvis dome of the bladder.  I mobilized the rectosigmoid colon & elevated it to put the main pedicle on tension.  I scored the base of peritoneum of the medial side of the mesentery of the elevated left colon from the ligament of Treitz to the mid rectum.   I elevated the sigmoid mesentery and entered into the retro-mesenteric plane. We were able to identify the left ureter and gonadal vessels. We kept those posterior within the retroperitoneum and elevated the left colon mesentery off that. I did isolate the inferior mesenteric artery (IMA) pedicle but did not ligate it yet.  I continued distally and got into the avascular plane posterior to the mesorectum, sparing the nervi ergentes.  We then focused on  freeing the inflammatory adhesions to the phlegmonous rectosigmoid colon and bladder dome.  Eventually freed that off.  We that had better mobility to help mobilize the rectum as well by freeing the mesorectum off the sacrum.  I stayed away from the right and left ureters.  I kept the lateral vascular pedicles to the rectum intact.  I skeletonized the lymph nodes off the inferior mesenteric artery pedicle.  I went down to its takeoff from the aorta.  I isolated the inferior mesenteric vein off of the ligament of Treitz just cephalad to that as well.  After confirming the left ureter was out of the way, I went ahead and ligated the inferior mesenteric artery pedicle just near its takeoff from the aorta.  I did ligate the inferior mesenteric vein in a similar fashion.  We ensured hemostasis.  I continued medial to lateral dissection to free the left colon mesentery off the retroperitoneum going up towards the splenic flexure to allow good mobility and protect the colon mesentery.  I mobilized the left colon in a lateral to medial fashion off the retroperitoneum and sidewall attachments along the line of Toldt up towards the splenic flexure to ensure good mobilization of the remaining left colon to reach into the pelvis.   We then focused on mesorectal dissection.  Freed the mesorectum off the presacral plane until I was distal to the concerning region.  Freed off peritoneum on the lateral sidewalls as well and transected the mesentery of the lateral pedicles to get distal to the area of concern.  Came  around anteriorly such that I had good circumferential mesorectal excision and a good margin distal to the area of concern.  Patient had some chronic thickening visceral peritoneum over the anterior rectum and cul-de-sac.  Came through the visceral peritoneum along the lateral pedicles and also between the rectum and bladder/prostate to help open up the region and avoid any concerns of stricturing or narrowing from  the chronic inflammation.  I chose a region at the descending/sigmoid junction that was soft and easily reached down to the rectal stump.  I skeletonized and transected the mesorectum at the proximal rectum.    We then chose a region for the proximal margin that would reach well for our planned anastomosis (distal descending colon).  Transected the colon mesentery radially to preserve good collateral and marginal artery blood supply.  To access vascular perfusion of tissues, we asked anesthesia use intravenous  indocyanine green (ICG) with IV flush.  I switched to the NIR fluorescence (Firefly mode) imaging window on the daVinci robot platform.  We were able to see good light green visualization of blood vessels with good vascular perfusion of tissues, confirming good tissue perfusion of tissues (distal descending colon and proximal rectum) planned for anastomosis.  Then transected at the distal margin with a robotic stapler.  We created an extraction incision through a small Pfannenstiel incision in the suprapubic region.  Placed a wound protector.  Had to extend the incision laterally in both corners to get the bulky inflamed twisted phlegmonous rectosigmoid colon out.  With that, I was able to eviscerate the rectosigmoid and descending colon out the wound.   I clamped the colon proximal to this area using a reusable pursestringer device.  Passed a 2-0 Keith needle. I transected at the descending/sigmoid junction with a scalpel. I got healthy bleeding mucosa.  Still some thin stool consistent with a chronic partial obstruction and fair prep.  Avoid any spillage or leaking.  We sent the rectosigmoid colon specimen off to go to pathology.  We sized the colon orifice.  I chose a 31mm EEA anvil stapler system.  I reinforced the prolene pursestring with interrupted silk "belt loop" sutures.  I placed the anvil to the open end of the proximal remaining colon and closed around it using the pursestring.    We did  copious irrigation with crystalloid solution.  Hemostasis was good.  The distal end of the remaining colon easily reached down to the rectal stump, therefore, splenic flexure mobilization was not needed.      Dr Maisie Fus scrubbed down and did gentle anal dilation and advanced the EEA stapler up the rectal stump. The spike was brought out at the provimal end of the rectal stump under direct visualization.  I  attached the anvil of the proximal colon the spike of the stapler. Anvil was tightened down and held clamped for 60 seconds.  Orientation was confirmed such that there is no twisting of the colon nor small bowel underneath the mesenteric defect. No concerning tension.  The EEA stapler was fired and held clamped for 30 seconds. The stapler was released & removed. Blue stitch is in the proximal ring.  Care was taken to ensure no other structures were incorporated within this either.  We noted 2 excellent anastomotic rings.   The colon proximal to the anastomosis was then gently occluded. The pelvis was filled with sterile irrigation.  Dr Maisie Fus  did rigid proctoscopy.  It had more liquid stool implying he was no longer obstructed.  However  she was able to insufflate with a good negative air leak test.  We advanced the rigid proctoscope to the anastomosis and noted the anastomosis was at 14 cm from the anal verge consistent with the proximal rectum.  There was no tension of mesentery or bowel at the anastomosis.   Tissues looked viable.  Ureters & bowel uninjured.  The anastomosis looked healthy. Greater omentum positioned down into the pelvis to help protect the anastomosis but it was very foreshortened and already would reach down to the umbilicus..  Endoluminal gas was evacuated.  Ports & wound protector removed.  We changed gloves & redraped the patient per colon SSI prevention protocol.  We aspirated the sterile irrigation.  Hemostasis was good.  Sterile unused instruments were used from this point.  I  closed the skin at the port sites using Monocryl stitch and sterile dressing.  We assured hemostasis and the former ostomy wound.  Wound irrigated.  I closed the posterior rectus fascia with 0 Vicryl suture.  Anterior rectus fascia was closed using #1 PDS transversely.   Sterile dressing placed.   Patient is being extubated go to recovery room. I had discussed postop care with the patient in detail the office & in the holding area. Instructions are written. I discussed operative findings, updated the patient's status, discussed probable steps to recovery, and gave postoperative recommendations to the patient's spouse, Jadarrius Maselli .  Recommendations were made.  Questions were answered.  She expressed understanding & appreciation.  Ardeth Sportsman, M.D., F.A.C.S. Gastrointestinal and Minimally Invasive Surgery [facility] [address] [place], [place] [address] [phone] Main / Paging

## 2023-09-25 NOTE — Discharge Instructions (Addendum)
SURGERY: POST OP INSTRUCTIONS (Surgery for small bowel obstruction, colon resection, etc)   ######################################################################  EAT Gradually transition to a high fiber diet with a fiber supplement over the next few days after discharge  WALK Walk an hour a day.  Control your pain to do that.    CONTROL PAIN Control pain so that you can walk, sleep, tolerate sneezing/coughing, go up/down stairs.  HAVE A BOWEL MOVEMENT DAILY Keep your bowels regular to avoid problems.  OK to try a laxative to override constipation.  OK to use an antidairrheal to slow down diarrhea.  Call if not better after 2 tries  CALL IF YOU HAVE PROBLEMS/CONCERNS Call if you are still struggling despite following these instructions. Call if you have concerns not answered by these instructions  ######################################################################   DIET Follow a light diet the first few days at home.  Start with a bland diet such as soups, liquids, starchy foods, low fat foods, etc.  If you feel full, bloated, or constipated, stay on a ful liquid or pureed/blenderized diet for a few days until you feel better and no longer constipated. Be sure to drink plenty of fluids every day to avoid getting dehydrated (feeling dizzy, not urinating, etc.). Gradually add a fiber supplement to your diet over the next week.  Gradually get back to a regular solid diet.  Avoid fast food or heavy meals the first week as you are more likely to get nauseated. It is expected for your digestive tract to need a few months to get back to normal.  It is common for your bowel movements and stools to be irregular.  You will have occasional bloating and cramping that should eventually fade away.  Until you are eating solid food normally, off all pain medications, and back to regular activities; your bowels will not be normal. Focus on eating a low-fat, high fiber diet the rest of your life  (See Getting to Good Bowel Health, below).  CARE of your INCISION or WOUND  It is good for closed incisions and even open wounds to be washed every day.  Shower every day.  Short baths are fine.  Wash the incisions and wounds clean with soap & water.    You may leave closed incisions open to air if it is dry.   You may cover the incision with clean gauze & replace it after your daily shower for comfort.  TEGADERM:  You have clear gauze band-aid dressings over your closed incision(s).  Remove the dressings 3 days after surgery.    If you have an open wound with a wound vac, see wound vac care instructions.    ACTIVITIES as tolerated Start light daily activities --- self-care, walking, climbing stairs-- beginning the day after surgery.  Gradually increase activities as tolerated.  Control your pain to be active.  Stop when you are tired.  Ideally, walk several times a day, eventually an hour a day.   Most people are back to most day-to-day activities in a few weeks.  It takes 4-8 weeks to get back to unrestricted, intense activity. If you can walk 30 minutes without difficulty, it is safe to try more intense activity such as jogging, treadmill, bicycling, low-impact aerobics, swimming, etc. Save the most intensive and strenuous activity for last (Usually 4-8 weeks after surgery) such as sit-ups, heavy lifting, contact sports, etc.  Refrain from any intense heavy lifting or straining until you are off narcotics for pain control.  You will have off days, but   things should improve week-by-week. DO NOT PUSH THROUGH PAIN.  Let pain be your guide: If it hurts to do something, don't do it.  Pain is your body warning you to avoid that activity for another week until the pain goes down. You may drive when you are no longer taking narcotic prescription pain medication, you can comfortably wear a seatbelt, and you can safely make sudden turns/stops to protect yourself without hesitating due to pain. You may  have sexual intercourse when it is comfortable. If it hurts to do something, stop.   MEDICATIONS Take your usually prescribed home medications unless otherwise directed.   Blood thinners:  You can restart any strong blood thinners after the second postoperative day.  It is OK to continue aspirin before & after surgery..    Some blood in BMs the first 1-2 weeks is common but should taper down & be small volume.  If you are passing many large clots, call your surgeon    PAIN CONTROL Pain after surgery or related to activity is often due to strain/injury to muscle, tendon, nerves and/or incisions.  This pain is usually short-term and will improve in a few months.  To help speed the process of healing and to get back to regular activity more quickly, DO THE FOLLOWING THINGS TOGETHER: Increase activity gradually.  DO NOT PUSH THROUGH PAIN Use Ice and/or Heat Try Gentle Massage and/or Stretching Take over the counter pain medication Take Narcotic prescription pain medication for more severe pain  Good pain control = faster recovery.  It is better to take more medicine to be more active than to stay in bed all day to avoid medications.  Increase activity gradually Avoid heavy lifting at first, then increase to lifting as tolerated over the next 6 weeks. Do not "push through" the pain.  Listen to your body and avoid positions and maneuvers than reproduce the pain.  Wait a few days before trying something more intense Walking an hour a day is encouraged to help your body recover faster and more safely.  Start slowly and stop when getting sore.  If you can walk 30 minutes without stopping or pain, you can try more intense activity (running, jogging, aerobics, cycling, swimming, treadmill, sex, sports, weightlifting, etc.) Remember: If it hurts to do it, then don't do it! Use Ice and/or Heat You will have swelling and bruising around the incisions.  This will take several weeks to resolve. Ice packs  or heating pads (6-8 times a day, 30-60 minutes at a time) will help sooth soreness & bruising. Some people prefer to use ice alone, heat alone, or alternate between ice & heat.  Experiment and see what works best for you.  Consider trying ice for the first few days to help decrease swelling and bruising; then, switch to heat to help relax sore spots and speed recovery. Shower every day.  Short baths are fine.  It feels good!  Keep the incisions and wounds clean with soap & water.   Try Gentle Massage and/or Stretching Massage at the area of pain many times a day Stop if you feel pain - do not overdo it Take over the counter pain medication This helps the muscle and nerve tissues become less irritable and calm down faster Choose ONE of the following over-the-counter anti-inflammatory medications: Acetaminophen 500mg tabs (Tylenol) 1-2 pills with every meal and just before bedtime (avoid if you have liver problems or if you have acetaminophen in you narcotic prescription) Naproxen 220mg tabs (  ex. Aleve, Naprosyn) 1-2 pills twice a day (avoid if you have kidney, stomach, IBD, or bleeding problems) Ibuprofen 200mg tabs (ex. Advil, Motrin) 3-4 pills with every meal and just before bedtime (avoid if you have kidney, stomach, IBD, or bleeding problems) Take with food/snack several times a day as directed for at least 2 weeks to help keep pain / soreness down & more manageable. Take Narcotic prescription pain medication for more severe pain A prescription for strong pain control is often given to you upon discharge (for example: oxycodone/Percocet, hydrocodone/Norco/Vicodin, or tramadol/Ultram) Take your pain medication as prescribed. Be mindful that most narcotic prescriptions contain Tylenol (acetaminophen) as well - avoid taking too much Tylenol. If you are having problems/concerns with the prescription medicine (does not control pain, nausea, vomiting, rash, itching, etc.), please call us [phone] to see if we need to switch you to a different pain medicine that will work better for you and/or control your side effects better. If you need a refill on your pain medication, you must call the office before 4 pm and on weekdays only.  By federal law, prescriptions for narcotics cannot be called into a pharmacy.  They must be filled out on paper & picked up from our office by the patient or authorized caretaker.  Prescriptions cannot be filled after 4 pm nor on weekends.    WHEN TO CALL US [phone] Severe uncontrolled or worsening pain  Fever over 101 F (38.5 C) Concerns with the incision: Worsening pain, redness, rash/hives, swelling, bleeding, or drainage Reactions / problems with new medications (itching, rash, hives, nausea, etc.) Nausea and/or vomiting Difficulty urinating Difficulty breathing Worsening fatigue, dizziness, lightheadedness, blurred vision Other concerns If you are not getting better after two weeks or are noticing you are getting worse, contact our office [phone] for further advice.  We may need to adjust your medications, re-evaluate you in the office, send you to the emergency room, or see what other things we can do to help. The clinic staff is available to answer your questions during regular business hours (8:30am-5pm).  Please don't hesitate to call and ask to speak to one of our nurses for clinical concerns.    A surgeon from [facility] is always on call at the hospitals 24 hours/day If you have a medical emergency, go to the nearest emergency room or call 911.  FOLLOW UP in our office One the day of your discharge from the hospital (or the next business weekday), please call [facility] to set up or confirm an appointment to see your surgeon in the office for a follow-up appointment.  Usually it is 2-3 weeks after your surgery.   If you have skin staples at your incision(s), let the office know so we can set up a time  in the office for the nurse to remove them (usually around 10 days after surgery). Make sure that you call for appointments the day of discharge (or the next business weekday) from the hospital to ensure a convenient appointment time. IF YOU HAVE DISABILITY OR FAMILY LEAVE FORMS, BRING THEM TO THE OFFICE FOR PROCESSING.  DO NOT GIVE THEM TO YOUR DOCTOR.  [facility] [address], [address], [place], [place]  [address] ? [phone] - Main [phone] - Toll Free,  [phone] - Fax [email or web address]    GETTING TO GOOD BOWEL HEALTH. It is expected for your digestive tract to need a few months to get back to   normal.  It is common for your bowel movements and stools to be irregular.  You will have occasional bloating and cramping that should eventually fade away.  Until you are eating solid food normally, off all pain medications, and back to regular activities; your bowels will not be normal.   Avoiding constipation The goal: ONE SOFT BOWEL MOVEMENT A DAY!    Drink plenty of fluids.  Choose water first. TAKE A FIBER SUPPLEMENT EVERY DAY THE REST OF YOUR LIFE During your first week back home, gradually add back a fiber supplement every day Experiment which form you can tolerate.   There are many forms such as powders, tablets, wafers, gummies, etc Psyllium bran (Metamucil), methylcellulose (Citrucel), Miralax or Glycolax, Benefiber, Flax Seed.  Adjust the dose week-by-week (1/2 dose/day to 6 doses a day) until you are moving your bowels 1-2 times a day.  Cut back the dose or try a different fiber product if it is giving you problems such as diarrhea or bloating. Sometimes a laxative is needed to help jump-start bowels if constipated until the fiber supplement can help regulate your bowels.  If you are tolerating eating & you are farting, it is okay to try a gentle laxative such as double dose MiraLax, prune juice, or Milk of Magnesia.  Avoid using  laxatives too often. Stool softeners can sometimes help counteract the constipating effects of narcotic pain medicines.  It can also cause diarrhea, so avoid using for too long. If you are still constipated despite taking fiber daily, eating solids, and a few doses of laxatives, call our office. Controlling diarrhea Try drinking liquids and eating bland foods for a few days to avoid stressing your intestines further. Avoid dairy products (especially milk & ice cream) for a short time.  The intestines often can lose the ability to digest lactose when stressed. Avoid foods that cause gassiness or bloating.  Typical foods include beans and other legumes, cabbage, broccoli, and dairy foods.  Avoid greasy, spicy, fast foods.  Every person has some sensitivity to other foods, so listen to your body and avoid those foods that trigger problems for you. Probiotics (such as active yogurt, Align, etc) may help repopulate the intestines and colon with normal bacteria and calm down a sensitive digestive tract Adding a fiber supplement gradually can help thicken stools by absorbing excess fluid and retrain the intestines to act more normally.  Slowly increase the dose over a few weeks.  Too much fiber too soon can backfire and cause cramping & bloating. It is okay to try and slow down diarrhea with a few doses of antidiarrheal medicines.   Bismuth subsalicylate (ex. Kayopectate, Pepto Bismol) for a few doses can help control diarrhea.  Avoid if pregnant.   Loperamide (Imodium) can slow down diarrhea.  Start with one tablet (2mg) first.  Avoid if you are having fevers or severe pain.  ILEOSTOMY PATIENTS WILL HAVE CHRONIC DIARRHEA since their colon is not in use.    Drink plenty of liquids.  You will need to drink even more glasses of water/liquid a day to avoid getting dehydrated. Record output from your ileostomy.  Expect to empty the bag every 3-4 hours at first.  Most people with a permanent ileostomy empty their  bag 4-6 times at the least.   Use antidiarrheal medicine (especially Imodium) several times a day to avoid getting dehydrated.  Start with a dose at bedtime & breakfast.  Adjust up or down as needed.  Increase antidiarrheal medications as   directed to avoid emptying the bag more than 8 times a day (every 3 hours). Work with your wound ostomy nurse to learn care for your ostomy.  See ostomy care instructions. TROUBLESHOOTING IRREGULAR BOWELS 1) Start with a soft & bland diet. No spicy, greasy, or fried foods.  2) Avoid gluten/wheat or dairy products from diet to see if symptoms improve. 3) Miralax 17gm or flax seed mixed in 8oz. water or juice-daily. May use 2-4 times a day as needed. 4) Gas-X, Phazyme, etc. as needed for gas & bloating.  5) Prilosec (omeprazole) over-the-counter as needed 6)  Consider probiotics (Align, Activa, etc) to help calm the bowels down  Call your doctor if you are getting worse or not getting better.  Sometimes further testing (cultures, endoscopy, X- studies, CT scans, bloodwork, etc.) may be needed to help diagnose and treat the cause of the diarrhea. [facility] [address], [address], [place], [place]  [address] [phone] - Main.    [phone]  - Toll Free.   [phone] - Fax [email or web address]  

## 2023-09-25 NOTE — Telephone Encounter (Signed)
No answer on  follow up call. Left message.   

## 2023-09-25 NOTE — Op Note (Signed)
Operative Note  Preoperative diagnosis:  1. Recurrent diverticulitis  Postoperative diagnosis: 1.  Recurrent diverticulitis   Procedure(s): 1.  Cystoscopy with bilateral ureteral FireFly injections  Surgeon: Rhoderick Moody, MD  Assistants:  None   Anesthesia:  General  Complications:  None  EBL:  <5 mL  Specimens: 1. None  Drains/Catheters: 1.  16 French Foley  Intraoperative findings:   No intravesical pathology was seen on cystoscopy  Indication:  The patient is a 42 year old male with a history of diverticulitis requiring a partial colectomy with Dr. Michaell Cowing.  Urology has been consulted to performed cystoscopy with bilateral ureteral Fire Fly injection to aide in intraoperative ureteral identification. The patient has been consented for the above procedures, voices understanding and wishes to proceed.  The patient has been consented for the above procedures, voices understanding and wishes to procede  Description of procedure: After informed consent was obtained, the patient was brought to the operating room and general endotracheal anesthesia was administered. The patient was then placed in the dorsolithotomy position and prepped and draped in usual sterile fashion. A timeout was performed. A 21 French rigid cystoscope was then inserted into the urethral meatus and advanced into the bladder under direct vision. A complete bladder survey revealed no intravesical pathology.   A 6 [place] open-ended catheter was then used to intubate the right ureteral orifice and a total of 7.5 mL of firefly solution diluted with 10 mL of saline was injected into the right collecting system. A similar maneuver was then carried out on the left with the same volume and concentration of firefly. The rigid cystoscope was then removed under direct vision. A 16 French Foley catheter was then inserted and placed to gravity drainage. The patient tolerated the procedure well. Dr. Michaell Cowing then proceeded  with their portion of the case  Plan:  Foley removal is at the discretion of the primary team.

## 2023-09-25 NOTE — Interval H&P Note (Signed)
History and Physical Interval Note:  09/25/2023 7:49 AM  Cory Gomez  has presented today for surgery, with the diagnosis of DIVERTICULITIS.  The various methods of treatment have been discussed with the patient and family. After consideration of risks, benefits and other options for treatment, the patient has consented to  Procedure(s): ROBOTIC RESECTION OF SIGMOID COLON WITH POSSIBLE OSTOMY (N/A) RIGID PROCTOSCOPY (N/A) CYSTOSCOPY with FIREFLY INJECTION (N/A) as a surgical intervention.  The patient's history has been reviewed, patient examined, no change in status, stable for surgery.  I have reviewed the patient's chart and labs.  Questions were answered to the patient's satisfaction.    I have re-reviewed the the patient's records, history, medications, and allergies.  I have re-examined the patient.  I again discussed intraoperative plans and goals of post-operative recovery.  The patient agrees to proceed.  Cory Gomez  1981/04/06 [id number]  Patient Care Team: [name], MD as PCP - General (Family Medicine) Karie Soda, MD as Consulting Physician (General Surgery)  Patient Active Problem List   Diagnosis Date Noted   Left lower quadrant abdominal pain 08/03/2023   Abscess of sigmoid colon 07/10/2023   Obesity, Class II, BMI 35-39.9 07/08/2023   Perforated sigmoid colon (HCC) 07/06/2023   Mood disorder (HCC) 06/08/2023   Annual physical exam 06/08/2023   Bilateral impacted cerumen 12/17/2022   Hypertension 01/11/2022   Sinusitis 12/30/2021   Morbid obesity with BMI of 40.0-44.9, adult (HCC) 07/17/2021   Hyperlipidemia 01/13/2020   Acne 09/09/2019   GAD (generalized anxiety disorder) 08/17/2019   ADD (attention deficit disorder) 07/24/2019   Binge eating disorder 07/24/2019   Vitamin D deficiency 06/11/2019   Prediabetes    Major depressive disorder, recurrent episode, in partial remission (HCC)     Past Medical History:  Diagnosis Date   ADHD  (attention deficit hyperactivity disorder)    Annual physical exam 09/09/2019   Anxiety and depression    COVID-19    12/28/19   Depression, recurrent (HCC) 09/09/2019   Elevated liver enzymes 06/11/2019   Excessive ear wax, left 09/09/2019   Headache 12/31/2021   History of kidney stones    Hypertriglyceridemia 07/17/2021   Insomnia    Insomnia    Inverted nipple 09/16/2019   Kidney stone    Mastoiditis of both sides    Nystagmus 01/22/2022   Obesity (BMI 30-39.9)    Obesity (BMI 30-39.9)    Prediabetes    Suicidal ideation 07/14/2021   Vertigo     Past Surgical History:  Procedure Laterality Date   NO PAST SURGERIES      Social History   Socioeconomic History   Marital status: Married    Spouse name: Not on file   Number of children: Not on file   Years of education: Not on file   Highest education level: Not on file  Occupational History   Not on file  Tobacco Use   Smoking status: Never   Smokeless tobacco: Never  Vaping Use   Vaping status: Never Used  Substance and Sexual Activity   Alcohol use: Never   Drug use: Never   Sexual activity: Yes    Partners: Female  Other Topics Concern   Not on file  Social History Narrative   Married no kids wife is Cory Gomez    -wife and [name] on [place]      Has siblings    Works Educational psychologist IT [employer]   1 dog  Social Determinants of Health   Financial Resource Strain: Not on file  Food Insecurity: No Food Insecurity (07/06/2023)   Hunger Vital Sign    Worried About Running Out of Food in the Last Year: Never true    Ran Out of Food in the Last Year: Never true  Transportation Needs: No Transportation Needs (07/06/2023)   PRAPARE - Administrator, Civil Service (Medical): No    Lack of Transportation (Non-Medical): No  Physical Activity: Not on file  Stress: Not on file  Social Connections: Not on file  Intimate Partner Violence: Not At Risk (07/06/2023)   Humiliation, Afraid,  Rape, and Kick questionnaire    Fear of Current or Ex-Partner: No    Emotionally Abused: No    Physically Abused: No    Sexually Abused: No    Family History  Problem Relation Age of Onset   Diabetes Mother    Hypertension Mother    Hyperlipidemia Mother    Diabetes Mellitus II Mother    Anxiety disorder Mother    Depression Mother    Stroke Father    Alcoholism Father    Anxiety disorder Brother    Depression Brother    Hypertension Brother    Hyperlipidemia Brother    Diabetes type II Brother    ADD / ADHD Brother    Dementia Maternal Grandfather    Stomach cancer Neg Hx    Colon cancer Neg Hx    Esophageal cancer Neg Hx    Pancreatic cancer Neg Hx     Medications Prior to Admission  Medication Sig Dispense Refill Last Dose   buPROPion (WELLBUTRIN XL) 150 MG 24 hr tablet Take 1 tablet (150 mg total) by mouth every morning. 30 tablet 2 09/24/2023   DULoxetine (CYMBALTA) 30 MG capsule Take 3 capsules (90 mg total) by mouth daily. 90 capsule 2 09/24/2023   eszopiclone (LUNESTA) 2 MG TABS tablet Take 1 tablet (2 mg total) by mouth at bedtime as needed for sleep. Take immediately before bedtime 30 tablet 2 Past Week    Current Facility-Administered Medications  Medication Dose Route Frequency Provider Last Rate Last Admin   bupivacaine liposome (EXPAREL) 1.3 % injection 266 mg  20 mL Infiltration Once Karie Soda, MD       cefoTEtan (CEFOTAN) 2 g in sodium chloride 0.9 % 100 mL IVPB  2 g Intravenous On Call to OR Karie Soda, MD       Melene Muller ON 09/26/2023] feeding supplement (ENSURE PRE-SURGERY) liquid 296 mL  296 mL Oral Once Karie Soda, MD       feeding supplement (ENSURE PRE-SURGERY) liquid 592 mL  592 mL Oral Once Karie Soda, MD       lactated ringers infusion   Intravenous Continuous Marcene Duos, MD 10 mL/hr at 09/25/23 0740 New Bag at 09/25/23 0740     No Known Allergies  BP (!) 145/98   Pulse 93   Temp 98.4 F (36.9 C) (Oral)   Resp 16   Ht 6\' 2"   (1.88 m)   Wt 126.6 kg   SpO2 96%   BMI 35.83 kg/m   Labs: No results found for this or any previous visit (from the past 48 hour(s)).  Imaging / Studies: No results found.   Ardeth Sportsman, M.D., F.A.C.S. Gastrointestinal and Minimally Invasive Surgery [facility] [address] [place], [place] [address] [phone] Main / Paging  09/25/2023 7:49 AM  Ardeth Sportsman, MD, FACS,  MASCRS Esophageal, Gastrointestinal & Colorectal Surgery Robotic and Minimally Invasive Surgery  [facility] [address] [place], [place] [address] [phone] Fax [phone] Main  CONTACT INFORMATION: Weekday (9AM-5PM): Call [facility] main office at [phone] Weeknight (5PM-9AM) or Weekend/Holiday: Check EPIC "Web Links" tab & use "AMION" (password " [email or web address]") for General Surgery [facility] coverage  Please, DO NOT use SecureChat  (it is not reliable communication to reach operating surgeons & will lead to a delay in care).   Epic staff messaging available for outptient concerns needing 1-2 business day response.       Ardeth Sportsman

## 2023-09-26 ENCOUNTER — Encounter (HOSPITAL_COMMUNITY): Payer: Self-pay | Admitting: Surgery

## 2023-09-26 LAB — CBC
HCT: 31.3 % — ABNORMAL LOW (ref 39.0–52.0)
Hemoglobin: 9.5 g/dL — ABNORMAL LOW (ref 13.0–17.0)
MCH: 24.7 pg — ABNORMAL LOW (ref 26.0–34.0)
MCHC: 30.4 g/dL (ref 30.0–36.0)
MCV: 81.3 fL (ref 80.0–100.0)
Platelets: 278 10*3/uL (ref 150–400)
RBC: 3.85 MIL/uL — ABNORMAL LOW (ref 4.22–5.81)
RDW: 15 % (ref 11.5–15.5)
WBC: 10.2 10*3/uL (ref 4.0–10.5)
nRBC: 0 % (ref 0.0–0.2)

## 2023-09-26 LAB — BASIC METABOLIC PANEL
Anion gap: 12 (ref 5–15)
BUN: 8 mg/dL (ref 6–20)
CO2: 24 mmol/L (ref 22–32)
Calcium: 8.6 mg/dL — ABNORMAL LOW (ref 8.9–10.3)
Chloride: 101 mmol/L (ref 98–111)
Creatinine, Ser: 0.93 mg/dL (ref 0.61–1.24)
GFR, Estimated: >60 mL/min (ref >60)
Glucose, Bld: 111 mg/dL — ABNORMAL HIGH (ref 70–99)
Potassium: 3.6 mmol/L (ref 3.5–5.1)
Sodium: 137 mmol/L (ref 135–145)

## 2023-09-26 LAB — MAGNESIUM: Magnesium: 2.1 mg/dL (ref 1.7–2.4)

## 2023-09-26 NOTE — TOC Initial Note (Signed)
Transition of Care [facility]) - Initial/Assessment Note    Patient Details  Name: Cory Gomez MRN: [id number] Date of Birth: [birth year]  Transition of Care [facility] CM/SW Contact:    Adrian Prows, RN Phone Number: 09/26/2023, 2:40 PM  Clinical Narrative:                 TOC for d/c planning; pt says he is from home; he is not sure if he will return or go to his mother's handicapped accessible home at d/c; he identified POC Cory Gomez (spouse) [phone]; he has transportation; patient says he is currently unemployed; his wife works but they "are barely scrapping by"; he is concerned about paying for food, housing, and utilities; he has glasses and grab bars in shower; pt says he does not have HH services or home oxygen; he agrees to receive resource for social services; pt says he lives in [place], [place]; resource for county's social services given to pt; he contact agency, and make appt; TOC will follow.  Expected Discharge Plan: Home/Self Care Barriers to Discharge: Continued Medical Work up   Patient Goals and CMS Choice Patient states their goals for this hospitalization and ongoing recovery are:: home          Expected Discharge Plan and Services   Discharge Planning Services: CM Consult   Living arrangements for the past 2 months: Single Family Home                                      Prior Living Arrangements/Services Living arrangements for the past 2 months: Single Family Home Lives with:: Spouse Patient language and need for interpreter reviewed:: Yes Do you feel safe going back to the place where you live?: Yes      Need for Family Participation in Patient Care: Yes (Comment) Care giver support system in place?: Yes (comment) Current home services:  (n/a) Criminal Activity/Legal Involvement Pertinent to Current Situation/Hospitalization: No - Comment as needed  Activities of Daily Living   ADL Screening (condition at time of  admission) Independently performs ADLs?: Yes (appropriate for developmental age) Does the patient have a NEW difficulty with bathing/dressing/toileting/self-feeding that is expected to last >3 days?: No Does the patient have a NEW difficulty with getting in/out of bed, walking, or climbing stairs that is expected to last >3 days?: No Does the patient have a NEW difficulty with communication that is expected to last >3 days?: No Is the patient deaf or have difficulty hearing?: No Does the patient have difficulty seeing, even when wearing glasses/contacts?: No Does the patient have difficulty concentrating, remembering, or making decisions?: No  Permission Sought/Granted Permission sought to share information with : Case Manager Permission granted to share information with : Yes, Verbal Permission Granted  Share Information with NAME: Case Manager     Permission granted to share info w Relationship: Cory Gomez (spouse) [phone]     Emotional Assessment Appearance:: Appears stated age Attitude/Demeanor/Rapport: Gracious Affect (typically observed): Accepting Orientation: : Oriented to Self, Oriented to Place, Oriented to  Time, Oriented to Situation Alcohol / Substance Use: Not Applicable Psych Involvement: No (comment)  Admission diagnosis:  Sigmoid diverticulitis [K57.32] Patient Active Problem List   Diagnosis Date Noted   Sigmoid diverticulitis 09/25/2023   Left lower quadrant abdominal pain 08/03/2023   Abscess of sigmoid colon 07/10/2023   Obesity, Class II, BMI 35-39.9 07/08/2023   Perforated  sigmoid colon (HCC) 07/06/2023   Mood disorder (HCC) 06/08/2023   Annual physical exam 06/08/2023   Bilateral impacted cerumen 12/17/2022   Hypertension 01/11/2022   Sinusitis 12/30/2021   Morbid obesity with BMI of 40.0-44.9, adult (HCC) 07/17/2021   Hyperlipidemia 01/13/2020   Acne 09/09/2019   GAD (generalized anxiety disorder) 08/17/2019   ADD (attention deficit disorder)  07/24/2019   Binge eating disorder 07/24/2019   Vitamin D deficiency 06/11/2019   Prediabetes    Major depressive disorder, recurrent episode, in partial remission (HCC)    PCP:  [name], MD Pharmacy:   [id number] [place], [place] - [address] AT [facility] [address] [facility] [place] [address] Phone: [phone] Fax: [phone]  [facility] [address] [place] [address] Phone: [phone] Fax: [phone]     Social Determinants of Health (SDOH) Social History: SDOH Screenings   Food Insecurity: Food Insecurity Present (09/26/2023)  Housing: Medium Risk (09/26/2023)  Transportation Needs: No Transportation Needs (09/26/2023)  Utilities: At Risk (09/26/2023)  Depression (PHQ2-9): Medium Risk (07/18/2023)  Tobacco Use: Low Risk  (09/25/2023)   SDOH Interventions: Food Insecurity Interventions: Inpatient TOC, Other (Comment) (resource given to patient) Housing Interventions: Inpatient TOC (resource given to patient) Transportation Interventions: Intervention Not Indicated, Inpatient TOC Utilities Interventions: Inpatient TOC (resource given to patient)   Readmission Risk Interventions    07/08/2023   10:13 AM  Readmission Risk Prevention Plan  Post Dischage Appt Complete  Medication Screening Complete  Transportation Screening Complete

## 2023-09-26 NOTE — Progress Notes (Signed)
09/26/2023  Cory Gomez [id number] 1981-10-29  CARE TEAM: PCP: [name], MD  Outpatient Care Team: Patient Care Team: [name], MD as PCP - General (Family Medicine) Karie Soda, MD as Consulting Physician (General Surgery)  Inpatient Treatment Team: Treatment Team:  Karie Soda, MD Ragaas, Josephine Igo, RN Vella Raring, [place]   Problem List:   Principal Problem:   Sigmoid diverticulitis   09/25/2023  POST-OPERATIVE DIAGNOSIS:  SIGMOID DIVERTICULITIS WITH PARTIAL COLON OBSTRUCTION   PROCEDURE:   -ROBOTIC LOW ANTERIOR RECTOSIGMOID RESECTION -TRANSVERSUS ABDOMINIS PLANE (TAP) BLOCK - BILATERAL -RIGID PROCTOSCOPY -INTRAOPERATIVE ASSESSMENT OF TISSUE VASCULAR PERFUSION USING ICG (indocyanine green)          IMMUNOFLUORESCENCE   SURGEON:  Ardeth Sportsman, MD  OR FINDINGS:    Patient had very thickened sigmoid colon with corkscrew like twisting and dense adhesions to dome of bladder.  Colon moderately dilated concerning for at least partial or near colon obstruction which correlates with difficulty of attempt at colonoscopy and bowel prep yesterday.  Bowel prep still fair.   No obvious metastatic disease on visceral parietal peritoneum or liver.   It is a 31mm EEA anastomosis ( distal descending colon  connected to proximal rectum.)  It rests 14 cm from the anal verge by rigid proctoscopy.  Assessment [facility] Stay = 1 days) 1 Day Post-Op    Recovering rather well so far    Plan:  -ERAS protocol -Advance diet -Multimodal pain control -Follow-up in pathology.  Most likely consistent with diverticulitis. -VTE prophylaxis- SCDs, etc -mobilize as tolerated to help recovery -Disposition:  Disposition:  The patient is from: Home Anticipate discharge to:  Home Anticipated Date of Discharge is:  October 4,2024   Barriers to discharge:  Pending Clinical improvement (more likely than not)  Patient currently is NOT MEDICALLY STABLE for  discharge from the hospital from a surgery standpoint.      I reviewed nursing notes, last 24 h vitals and pain scores, last 48 h intake and output, last 24 h labs and trends, and last 24 h imaging results.  I have reviewed this patient's available data, including medical history, events of note, test results, etc as part of my evaluation.   A significant portion of that time was spent in counseling. Care during the described time interval was provided by me.  This care required straight-forward level of medical decision making.  09/26/2023    Subjective: (Chief complaint)  Patient with soreness getting up the first time but walking the hallways.  Pain controlled.  Having flatus and bowel movements.  Tolerating liquids.  Appreciative of nursing and surgical care  Objective:  Vital signs:  Vitals:   09/25/23 2050 09/25/23 2058 09/26/23 0136 09/26/23 0452  BP: 137/75 137/75 (!) 146/75 138/75  Pulse: (!) 102 (!) 102 96 87  Resp: 18 18 18 18   Temp: 98.6 F (37 C) 98.6 F (37 C) 98.4 F (36.9 C) 98.3 F (36.8 C)  TempSrc: Oral Oral Oral Oral  SpO2: 98% 98% 95% 95%  Weight:      Height:        Last BM Date : 09/25/23  Intake/Output   Yesterday:  10/02 0701 - 10/03 0700 In: 3161.4 [P.O.:1560; I.V.:1501.4; IV Piggyback:100] Out: 3550 [Urine:3450; Blood:100] This shift:  No intake/output data recorded.  Bowel function:  Flatus: YES  BM:  YES  Drain: (No drain)   Physical Exam:  General: Pt awake/alert in no acute distress Eyes: PERRL, normal EOM.  Sclera clear.  No icterus Neuro: CN II-XII intact w/o focal sensory/motor deficits. Lymph: No head/neck/groin lymphadenopathy Psych:  No delerium/psychosis/paranoia.  Oriented x 4 HENT: Normocephalic, Mucus membranes moist.  No thrush Neck: Supple, No tracheal deviation.  No obvious thyromegaly Chest: No pain to chest wall compression.  Good respiratory excursion.  No audible wheezing CV:  Pulses intact.   Regular rhythm.  No major extremity edema MS: Normal AROM mjr joints.  No obvious deformity  Abdomen: Soft.  Nondistended.  Mildly tender at incisions only.  No evidence of peritonitis.  No incarcerated hernias.  Ext:   No deformity.  No mjr edema.  No cyanosis Skin: No petechiae / purpurea.  No major sores.  Warm and dry    Results:   Cultures: No results found for this or any previous visit (from the past 720 hour(s)).  Labs: Results for orders placed or performed during the hospital encounter of 09/25/23 (from the past 48 hour(s))  ABO/Rh     Status: None   Collection Time: 09/25/23  7:30 AM  Result Value Ref Range   ABO/RH(D)      O POS Performed at [facility], [address] [place], [place] [address]   Hemoglobin A1c     Status: Abnormal   Collection Time: 09/25/23 12:22 PM  Result Value Ref Range   Hgb A1c MFr Bld 5.7 (H) 4.8 - 5.6 %    Comment: (NOTE) Pre diabetes:          5.7%-6.4%  Diabetes:              >6.4%  Glycemic control for   <7.0% adults with diabetes    Mean Plasma Glucose 116.89 mg/dL    Comment: Performed at [facility], [address] [place], [place] [address]  Basic metabolic panel     Status: Abnormal   Collection Time: 09/26/23  4:36 AM  Result Value Ref Range   Sodium 137 135 - 145 mmol/L   Potassium 3.6 3.5 - 5.1 mmol/L   Chloride 101 98 - 111 mmol/L   CO2 24 22 - 32 mmol/L   Glucose, Bld 111 (H) 70 - 99 mg/dL    Comment: Glucose reference range applies only to samples taken after fasting for at least 8 hours.   BUN 8 6 - 20 mg/dL   Creatinine, Ser 3.29 0.61 - 1.24 mg/dL   Calcium 8.6 (L) 8.9 - 10.3 mg/dL   GFR, Estimated >51 >88 mL/min    Comment: (NOTE) Calculated using the CKD-EPI Creatinine Equation (2021)    Anion gap 12 5 - 15    Comment: Performed at [facility], [address] [place], [place] [address]  CBC     Status: Abnormal   Collection Time: 09/26/23  4:36 AM   Result Value Ref Range   WBC 10.2 4.0 - 10.5 K/uL   RBC 3.85 (L) 4.22 - 5.81 MIL/uL   Hemoglobin 9.5 (L) 13.0 - 17.0 g/dL   HCT 63.0 (L) 16.0 - 10.9 %   MCV 81.3 80.0 - 100.0 fL   MCH 24.7 (L) 26.0 - 34.0 pg   MCHC 30.4 30.0 - 36.0 g/dL   RDW 32.3 55.7 - 32.2 %   Platelets 278 150 - 400 K/uL   nRBC 0.0 0.0 - 0.2 %    Comment: Performed at [facility], [address], [place], [place] [address]  Magnesium     Status: None   Collection Time: 09/26/23  4:36 AM  Result Value Ref Range   Magnesium 2.1 1.7 - 2.4 mg/dL    Comment: Performed at [facility], [address] [place], [place] [address]    Imaging / Studies: No results found.  Medications / Allergies: per chart  Antibiotics: Anti-infectives (From admission, onward)    Start     Dose/Rate Route Frequency Ordered Stop   09/25/23 2100  cefoTEtan (CEFOTAN) 2 g in sodium chloride 0.9 % 100 mL IVPB        2 g 200 mL/hr over 30 Minutes Intravenous Every 12 hours 09/25/23 1243 09/25/23 2234   09/25/23 1400  neomycin (MYCIFRADIN) tablet 1,000 mg  Status:  Discontinued       Placed in "And" Linked Group   1,000 mg Oral 3 times per day 09/25/23 0639 09/25/23 0644   09/25/23 1400  metroNIDAZOLE (FLAGYL) tablet 1,000 mg  Status:  Discontinued       Placed in "And" Linked Group   1,000 mg Oral 3 times per day 09/25/23 0639 09/25/23 0644   09/25/23 0645  cefoTEtan (CEFOTAN) 2 g in sodium chloride 0.9 % 100 mL IVPB        2 g 200 mL/hr over 30 Minutes Intravenous On call to O.R. 09/25/23 4401 09/25/23 0844         Note: Portions of this report may have been transcribed using voice recognition software. Every effort was made to ensure accuracy; however, inadvertent computerized transcription errors may be present.   Any transcriptional errors that result from this process are unintentional.    Ardeth Sportsman, MD, FACS, MASCRS Esophageal, Gastrointestinal & Colorectal Surgery Robotic  and Minimally Invasive Surgery  [facility] [address] [place], [place] [address] [phone] Fax [phone] Main  CONTACT INFORMATION: Weekday (9AM-5PM): Call [facility] main office at [phone] Weeknight (5PM-9AM) or Weekend/Holiday: Check EPIC "Web Links" tab & use "AMION" (password " [email or web address]") for General Surgery [facility] coverage  Please, DO NOT use SecureChat  (it is not reliable communication to reach operating surgeons & will lead to a delay in care).   Epic staff messaging available for outptient concerns needing 1-2 business day response.      09/26/2023  7:02 AM

## 2023-09-27 MED ORDER — CYCLOBENZAPRINE HCL 10 MG PO TABS: 10.0000 mg | ORAL_TABLET | Freq: Three times a day (TID) | ORAL | 1 refills | Status: DC | PRN

## 2023-09-27 NOTE — Discharge Summary (Signed)
Physician Discharge Summary    Cory Gomez MRN: [id number] DOB/AGE: 1981/05/20 = 41 y.o.  Patient Care Team: [name], MD as PCP - General (Family Medicine) Karie Soda, MD as Consulting Physician (General Surgery)  Admit date: 09/25/2023  Discharge date: 09/27/2023  Hospital Stay = 2 days    Discharge Diagnoses:  Principal Problem:   Sigmoid diverticulitis   2 Days Post-Op  09/25/2023  POST-OPERATIVE DIAGNOSIS:  SIGMOID DIVERTICULITIS WITH PARTIAL COLON OBSTRUCTION   PROCEDURE:   -ROBOTIC LOW ANTERIOR RECTOSIGMOID RESECTION -TRANSVERSUS ABDOMINIS PLANE (TAP) BLOCK - BILATERAL -RIGID PROCTOSCOPY -INTRAOPERATIVE ASSESSMENT OF TISSUE VASCULAR PERFUSION USING ICG (indocyanine green)          IMMUNOFLUORESCENCE   SURGEON:  Ardeth Sportsman, MD   OR FINDINGS:    Patient had very thickened sigmoid colon with corkscrew like twisting and dense adhesions to dome of bladder.  Colon moderately dilated concerning for at least partial or near colon obstruction which correlates with difficulty of attempt at colonoscopy and bowel prep yesterday.  Bowel prep still fair.   No obvious metastatic disease on visceral parietal peritoneum or liver.   It is a 31mm EEA anastomosis ( distal descending colon  connected to proximal rectum.)  It rests 14 cm from the anal verge by rigid proctoscopy.  SURGEON:    Surgeon(s): Karie Soda, MD Romie Levee, MD Rene Paci, MD  Consults: Case Management / Social Work, Anesthesia, and Urology  Hospital Course:   The patient underwent the surgery above.  Postoperatively, the patient gradually mobilized and advanced to a solid diet.  Pain and other symptoms were treated aggressively.    By the time of discharge, the patient was walking well the hallways, eating food, having flatus.  Pain was well-controlled on an oral medications.  Based on meeting discharge criteria and continuing to recover, I felt it was safe for  the patient to be discharged from the hospital to further recover with close followup. Postoperative recommendations were discussed in detail.  They are written as well.  Discharged Condition: good  Discharge Exam: Blood pressure 133/81, pulse 96, temperature 98.3 F (36.8 C), temperature source Oral, resp. rate 16, height 6\' 2"  (1.88 m), weight 127.7 kg, SpO2 97%.  General: Pt awake/alert/oriented x4 in No acute distress Eyes: PERRL, normal EOM.  Sclera clear.  No icterus Neuro: CN II-XII intact w/o focal sensory/motor deficits. Lymph: No head/neck/groin lymphadenopathy Psych:  No delerium/psychosis/paranoia HENT: Normocephalic, Mucus membranes moist.  No thrush Neck: Supple, No tracheal deviation Chest:  No chest wall pain w good excursion CV:  Pulses intact.  Regular rhythm MS: Normal AROM mjr joints.  No obvious deformity Abdomen: Soft.  Nondistended.  Mildly tender at incisions only.  Dressings and incisions clean dry and intact.  No evidence of peritonitis.  No incarcerated hernias. Ext:  SCDs BLE.  No mjr edema.  No cyanosis Skin: No petechiae / purpura   Disposition:    Follow-up Information     Karie Soda, MD. Schedule an appointment as soon as possible for a visit in 1 month(s).   Specialties: General Surgery, Colon and Rectal Surgery Why: To follow up after your operation Contact information: [address] [place] [address] [phone]                 Discharge disposition: 01-Home or Self Care       Discharge Instructions     Call MD for:   Complete by: As directed  FEVER > 101.5 F  (temperatures < 101.5 F are not significant)   Call MD for:  extreme fatigue   Complete by: As directed    Call MD for:  persistant dizziness or light-headedness   Complete by: As directed    Call MD for:  persistant nausea and vomiting   Complete by: As directed    Call MD for:  redness, tenderness, or signs of infection (pain, swelling,  redness, odor or green/yellow discharge around incision site)   Complete by: As directed    Call MD for:  severe uncontrolled pain   Complete by: As directed    Diet - low sodium heart healthy   Complete by: As directed    Start with a bland diet such as soups, liquids, starchy foods, low fat foods, etc. the first few days at home. Gradually advance to a solid, low-fat, high fiber diet by the end of the first week at home.   Add a fiber supplement to your diet (Metamucil, etc) If you feel full, bloated, or constipated, stay on a full liquid or pureed/blenderized diet for a few days until you feel better and are no longer constipated.   Discharge instructions   Complete by: As directed    See Discharge Instructions If you are not getting better after two weeks or are noticing you are getting worse, contact our office [phone] for further advice.  We may need to adjust your medications, re-evaluate you in the office, send you to the emergency room, or see what other things we can do to help. The clinic staff is available to answer your questions during regular business hours (8:30am-5pm).  Please don't hesitate to call and ask to speak to one of our nurses for clinical concerns.    A surgeon from [facility] is always on call at the hospitals 24 hours/day If you have a medical emergency, go to the nearest emergency room or call 911.   Discharge wound care:   Complete by: As directed    It is good for closed incisions and even open wounds to be washed every day.  Shower every day.  Short baths are fine.  Wash the incisions and wounds clean with soap & water.    You may leave closed incisions open to air if it is dry.   You may cover the incision with clean gauze & replace it after your daily shower for comfort.  TEGADERM:  You have clear gauze band-aid dressings over your closed incision(s).  Remove the dressings 3 days after surgery.   Driving Restrictions   Complete by: As  directed    You may drive when: - you are no longer taking narcotic prescription pain medication - you can comfortably wear a seatbelt - you can safely make sudden turns/stops without pain.   Increase activity slowly   Complete by: As directed    Start light daily activities --- self-care, walking, climbing stairs- beginning the day after surgery.  Gradually increase activities as tolerated.  Control your pain to be active.  Stop when you are tired.  Ideally, walk several times a day, eventually an hour a day.   Most people are back to most day-to-day activities in a few weeks.  It takes 4-6 weeks to get back to unrestricted, intense activity. If you can walk 30 minutes without difficulty, it is safe to try more intense activity such as jogging, treadmill, bicycling, low-impact aerobics, swimming, etc. Save the most intensive and strenuous  activity for last (Usually 4-8 weeks after surgery) such as sit-ups, heavy lifting, contact sports, etc.  Refrain from any intense heavy lifting or straining until you are off narcotics for pain control.  You will have off days, but things should improve week-by-week. DO NOT PUSH THROUGH PAIN.  Let pain be your guide: If it hurts to do something, don't do it.   Lifting restrictions   Complete by: As directed    If you can walk 30 minutes without difficulty, it is safe to try more intense activity such as jogging, treadmill, bicycling, low-impact aerobics, swimming, etc. Save the most intensive and strenuous activity for last (Usually 4-8 weeks after surgery) such as sit-ups, heavy lifting, contact sports, etc.   Refrain from any intense heavy lifting or straining until you are off narcotics for pain control.  You will have off days, but things should improve week-by-week. DO NOT PUSH THROUGH PAIN.  Let pain be your guide: If it hurts to do something, don't do it.  Pain is your body warning you to avoid that activity for another week until the pain goes down.    May shower / Bathe   Complete by: As directed    May walk up steps   Complete by: As directed    Remove dressing in 72 hours   Complete by: As directed    Make sure all dressings are removed by the third day after surgery.  Leave incisions open to air.  OK to cover incisions with gauze or bandages as desired   Sexual Activity Restrictions   Complete by: As directed    You may have sexual intercourse when it is comfortable. If it hurts to do something, stop.       Allergies as of 09/27/2023   No Known Allergies      Medication List     TAKE these medications    buPROPion 150 MG 24 hr tablet Commonly known as: Wellbutrin XL Take 1 tablet (150 mg total) by mouth every morning.   cyclobenzaprine 10 MG tablet Commonly known as: FLEXERIL Take 1 tablet (10 mg total) by mouth 3 (three) times daily as needed for muscle spasms.   DULoxetine 30 MG capsule Commonly known as: Cymbalta Take 3 capsules (90 mg total) by mouth daily.   eszopiclone 2 MG Tabs tablet Commonly known as: Lunesta Take 1 tablet (2 mg total) by mouth at bedtime as needed for sleep. Take immediately before bedtime   oxyCODONE 5 MG immediate release tablet Commonly known as: Oxy IR/ROXICODONE Take 1 tablet (5 mg total) by mouth every 6 (six) hours as needed for moderate pain, severe pain or breakthrough pain.               Discharge Care Instructions  (From admission, onward)           Start     Ordered   09/25/23 0000  Discharge wound care:       Comments: It is good for closed incisions and even open wounds to be washed every day.  Shower every day.  Short baths are fine.  Wash the incisions and wounds clean with soap & water.    You may leave closed incisions open to air if it is dry.   You may cover the incision with clean gauze & replace it after your daily shower for comfort.  TEGADERM:  You have clear gauze band-aid dressings over your closed incision(s).  Remove the dressings 3 days after  surgery.  09/25/23 0826            Significant Diagnostic Studies:  Results for orders placed or performed during the hospital encounter of 09/25/23 (from the past 72 hour(s))  ABO/Rh     Status: None   Collection Time: 09/25/23  7:30 AM  Result Value Ref Range   ABO/RH(D)      O POS Performed at [facility], [address], [place], [place] [address]   Hemoglobin A1c     Status: Abnormal   Collection Time: 09/25/23 12:22 PM  Result Value Ref Range   Hgb A1c MFr Bld 5.7 (H) 4.8 - 5.6 %    Comment: (NOTE) Pre diabetes:          5.7%-6.4%  Diabetes:              >6.4%  Glycemic control for   <7.0% adults with diabetes    Mean Plasma Glucose 116.89 mg/dL    Comment: Performed at [facility], [address] [place], [place] [address]  Basic metabolic panel     Status: Abnormal   Collection Time: 09/26/23  4:36 AM  Result Value Ref Range   Sodium 137 135 - 145 mmol/L   Potassium 3.6 3.5 - 5.1 mmol/L   Chloride 101 98 - 111 mmol/L   CO2 24 22 - 32 mmol/L   Glucose, Bld 111 (H) 70 - 99 mg/dL    Comment: Glucose reference range applies only to samples taken after fasting for at least 8 hours.   BUN 8 6 - 20 mg/dL   Creatinine, Ser 8.41 0.61 - 1.24 mg/dL   Calcium 8.6 (L) 8.9 - 10.3 mg/dL   GFR, Estimated >32 >44 mL/min    Comment: (NOTE) Calculated using the CKD-EPI Creatinine Equation (2021)    Anion gap 12 5 - 15    Comment: Performed at [facility], [address], [place], [place] [address]  CBC     Status: Abnormal   Collection Time: 09/26/23  4:36 AM  Result Value Ref Range   WBC 10.2 4.0 - 10.5 K/uL   RBC 3.85 (L) 4.22 - 5.81 MIL/uL   Hemoglobin 9.5 (L) 13.0 - 17.0 g/dL   HCT 25.3 (L) 66.4 - 40.3 %   MCV 81.3 80.0 - 100.0 fL   MCH 24.7 (L) 26.0 - 34.0 pg   MCHC 30.4 30.0 - 36.0 g/dL   RDW 47.4 25.9 - 56.3 %   Platelets 278 150 - 400 K/uL   nRBC 0.0 0.0 - 0.2 %    Comment: Performed at [facility], [address] [place], [place] [address]  Magnesium     Status: None   Collection Time: 09/26/23  4:36 AM  Result Value Ref Range   Magnesium 2.1 1.7 - 2.4 mg/dL    Comment: Performed at [facility], [address], [place], [place] [address]    No results found.  Past Medical History:  Diagnosis Date   ADHD (attention deficit hyperactivity disorder)    Annual physical exam 09/09/2019   Anxiety and depression    COVID-19    12/28/19   Depression, recurrent (HCC) 09/09/2019   Elevated liver enzymes 06/11/2019   Excessive ear wax, left 09/09/2019   Headache 12/31/2021   History of kidney stones    Hypertriglyceridemia 07/17/2021   Insomnia    Insomnia    Inverted nipple 09/16/2019   Kidney stone    Mastoiditis of  both sides    Nystagmus 01/22/2022   Obesity (BMI 30-39.9)    Obesity (BMI 30-39.9)    Prediabetes    Suicidal ideation 07/14/2021   Vertigo     Past Surgical History:  Procedure Laterality Date   NO PAST SURGERIES     PROCTOSCOPY N/A 09/25/2023   Procedure: RIGID PROCTOSCOPY;  Surgeon: Karie Soda, MD;  Location: [facility];  Service: General;  Laterality: N/A;    Social History   Socioeconomic History   Marital status: Married    Spouse name: Not on file   Number of children: Not on file   Years of education: Not on file   Highest education level: Not on file  Occupational History   Not on file  Tobacco Use   Smoking status: Never   Smokeless tobacco: Never  Vaping Use   Vaping status: Never Used  Substance and Sexual Activity   Alcohol use: Never   Drug use: Never   Sexual activity: Yes    Partners: Female  Other Topics Concern   Not on file  Social History Narrative   Married no kids wife is Webster Liera    -wife and mother [name] on [place]      Has siblings    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   1 dog    Social Determinants of Health   Financial Resource Strain: Not on file  Food Insecurity: Food  Insecurity Present (09/26/2023)   Hunger Vital Sign    Worried About Running Out of Food in the Last Year: Sometimes true    Ran Out of Food in the Last Year: Sometimes true  Transportation Needs: No Transportation Needs (09/26/2023)   PRAPARE - Administrator, Civil Service (Medical): No    Lack of Transportation (Non-Medical): No  Physical Activity: Not on file  Stress: Not on file  Social Connections: Not on file  Intimate Partner Violence: Not At Risk (09/26/2023)   Humiliation, Afraid, Rape, and Kick questionnaire    Fear of Current or Ex-Partner: No    Emotionally Abused: No    Physically Abused: No    Sexually Abused: No    Family History  Problem Relation Age of Onset   Diabetes Mother    Hypertension Mother    Hyperlipidemia Mother    Diabetes Mellitus II Mother    Anxiety disorder Mother    Depression Mother    Stroke Father    Alcoholism Father    Anxiety disorder Brother    Depression Brother    Hypertension Brother    Hyperlipidemia Brother    Diabetes type II Brother    ADD / ADHD Brother    Dementia Maternal Grandfather    Stomach cancer Neg Hx    Colon cancer Neg Hx    Esophageal cancer Neg Hx    Pancreatic cancer Neg Hx     Current Facility-Administered Medications  Medication Dose Route Frequency Provider Last Rate Last Admin   acetaminophen (TYLENOL) tablet 1,000 mg  1,000 mg Oral Q6H Karie Soda, MD   1,000 mg at 09/27/23 0505   alum & mag hydroxide-simeth (MAALOX/MYLANTA) 200-200-20 MG/5ML suspension 30 mL  30 mL Oral Q6H PRN Karie Soda, MD       buPROPion (WELLBUTRIN XL) 24 hr tablet 150 mg  150 mg Oral Daily Karie Soda, MD   150 mg at 09/26/23 0910   DULoxetine (CYMBALTA) DR capsule 90 mg  90 mg Oral Daily Karie Soda, MD  90 mg at 09/26/23 0910   enoxaparin (LOVENOX) injection 40 mg  40 mg Subcutaneous Q24H Karie Soda, MD   40 mg at 09/26/23 0454   feeding supplement (ENSURE SURGERY) liquid 237 mL  237 mL Oral BID BM  Karie Soda, MD   237 mL at 09/26/23 1311   hydrALAZINE (APRESOLINE) injection 10 mg  10 mg Intravenous Q2H PRN Karie Soda, MD       HYDROmorphone (DILAUDID) injection 0.5-2 mg  0.5-2 mg Intravenous Q4H PRN Karie Soda, MD   1 mg at 09/25/23 1621   lactated ringers infusion   Intravenous Q8H PRN Karie Soda, MD       magic mouthwash  15 mL Oral QID PRN Karie Soda, MD       melatonin tablet 3 mg  3 mg Oral QHS PRN Karie Soda, MD   3 mg at 09/26/23 2108   menthol-cetylpyridinium (CEPACOL) lozenge 3 mg  1 lozenge Oral PRN Karie Soda, MD       methocarbamol (ROBAXIN) 1,000 mg in dextrose 5 % 100 mL IVPB  1,000 mg Intravenous Q6H PRN Karie Soda, MD       methocarbamol (ROBAXIN) tablet 1,000 mg  1,000 mg Oral Q6H PRN Karie Soda, MD       metoprolol tartrate (LOPRESSOR) injection 5 mg  5 mg Intravenous Q6H PRN Karie Soda, MD       naphazoline-glycerin (CLEAR EYES REDNESS) ophth solution 1-2 drop  1-2 drop Both Eyes QID PRN Karie Soda, MD       ondansetron [facility]) tablet 4 mg  4 mg Oral Q6H PRN Karie Soda, MD       Or   ondansetron [facility]) injection 4 mg  4 mg Intravenous Q6H PRN Karie Soda, MD       oxyCODONE (Oxy IR/ROXICODONE) immediate release tablet 5-10 mg  5-10 mg Oral Q4H PRN Karie Soda, MD   10 mg at 09/27/23 0506   phenol (CHLORASEPTIC) mouth spray 2 spray  2 spray Mouth/Throat Q2H PRN Karie Soda, MD       polycarbophil (FIBERCON) tablet 625 mg  625 mg Oral BID Karie Soda, MD   625 mg at 09/26/23 2108   prochlorperazine (COMPAZINE) tablet 10 mg  10 mg Oral Q6H PRN Karie Soda, MD       Or   prochlorperazine (COMPAZINE) injection 5-10 mg  5-10 mg Intravenous Q6H PRN Karie Soda, MD       simethicone (MYLICON) chewable tablet 40 mg  40 mg Oral Q6H PRN Karie Soda, MD       sodium chloride (OCEAN) 0.65 % nasal spray 1-2 spray  1-2 spray Each Nare Q6H PRN Karie Soda, MD         No Known Allergies  Signed:   Ardeth Sportsman, MD, FACS,  MASCRS Esophageal, Gastrointestinal & Colorectal Surgery Robotic and Minimally Invasive Surgery  [facility] [address], [address] [place], [place] [address] [phone] Fax [phone] Main  CONTACT INFORMATION: Weekday (9AM-5PM): Call [facility] main office at [phone] Weeknight (5PM-9AM) or Weekend/Holiday: Check EPIC "Web Links" tab & use "AMION" (password " [email or web address]") for General Surgery [facility] coverage  Please, DO NOT use SecureChat  (it is not reliable communication to reach operating surgeons & will lead to a delay in care).   Epic staff messaging available for outptient concerns needing 1-2 business day response.      09/27/2023, 7:45 AM

## 2023-09-27 NOTE — Progress Notes (Signed)
Reviewed d/c instructions with pt. All questions answered. Pt taken to front entrance via wheelchair to meet ride.

## 2023-09-30 ENCOUNTER — Telehealth: Payer: Self-pay | Admitting: *Deleted

## 2023-09-30 ENCOUNTER — Encounter: Payer: Self-pay | Admitting: Surgery

## 2023-09-30 DIAGNOSIS — C187 Malignant neoplasm of sigmoid colon: Secondary | ICD-10-CM | POA: Insufficient documentation

## 2023-09-30 NOTE — Transitions of Care (Post Inpatient/ED Visit) (Unsigned)
09/30/2023  Name: Cory Gomez MRN: [id number] DOB: 1981-11-11  Today's TOC FU Call Status: Today's TOC FU Call Status:: Successful TOC FU Call Completed TOC FU Call Complete Date: 09/30/23 Patient's Name and Date of Birth confirmed.  Transition Care Management Follow-up Telephone Call Date of Discharge: 09/27/23 Discharge Facility: [facility]) Type of Discharge: Inpatient Admission Primary Inpatient Discharge Diagnosis:: SIGMOID DIVERTICULITIS WITH PARTIAL COLON OBSTRUCTION How have you been since you were released from the hospital?: Better (a little sore) Any questions or concerns?: No  Items Reviewed: Did you receive and understand the discharge instructions provided?: Yes Medications obtained,verified, and reconciled?: Yes (Medications Reviewed) Any new allergies since your discharge?: No Dietary orders reviewed?: No Do you have support at home?: Yes People in Home: spouse Name of Support/Comfort Primary Source: tracy spouse and his mother  Medications Reviewed Today: Medications Reviewed Today     Reviewed by Luella Cook, RN (Case Manager) on 09/30/23 at 1619  Med List Status: <None>   Medication Order Taking? Sig Documenting Provider Last Dose Status Informant  buPROPion (WELLBUTRIN XL) 150 MG 24 hr tablet [id number] Yes Take 1 tablet (150 mg total) by mouth every morning. Stasia Cavalier, MD Taking Active Self  cyclobenzaprine (FLEXERIL) 10 MG tablet [id number] Yes Take 1 tablet (10 mg total) by mouth 3 (three) times daily as needed for muscle spasms. Karie Soda, MD Taking Active   DULoxetine (CYMBALTA) 30 MG capsule [id number] Yes Take 3 capsules (90 mg total) by mouth daily. Stasia Cavalier, MD Taking Active Self  eszopiclone Alfonso Patten) 2 MG TABS tablet [id number] Yes Take 1 tablet (2 mg total) by mouth at bedtime as needed for sleep. Take immediately before bedtime Stasia Cavalier, MD Taking Active Self  oxyCODONE (OXY IR/ROXICODONE) 5 MG immediate  release tablet [id number] Yes Take 1 tablet (5 mg total) by mouth every 6 (six) hours as needed for moderate pain, severe pain or breakthrough pain. Karie Soda, MD Taking Active             Home Care and Equipment/Supplies: Were Home Health Services Ordered?: NA Any new equipment or medical supplies ordered?: NA  Functional Questionnaire: Do you need assistance with bathing/showering or dressing?: No Do you need assistance with meal preparation?: Yes Do you need assistance with eating?: No Do you have difficulty maintaining continence: No Do you need assistance with getting out of bed/getting out of a chair/moving?: No Do you have difficulty managing or taking your medications?: No  Follow up appointments reviewed: PCP Follow-up appointment confirmed?: NA Specialist Hospital Follow-up appointment confirmed?: Yes Date of Specialist follow-up appointment?: 10/14/23 Follow-Up Specialty Provider:: Dr Michaell Cowing Do you need transportation to your follow-up appointment?: No Do you understand care options if your condition(s) worsen?: Yes-patient verbalized understanding  SDOH Interventions Today    Flowsheet Row Most Recent Value  SDOH Interventions   Food Insecurity Interventions Intervention Not Indicated  [per patient today he stated no problem]  Housing Interventions Intervention Not Indicated  [Per patioent today no problems and did not need a referral to social worker]  Transportation Interventions Intervention Not Indicated, Patient Resources (Friends/Family)      Interventions Today    Flowsheet Row Most Recent Value  Chronic Disease   Chronic disease during today's visit Other  General Interventions   General Interventions Discussed/Reviewed General Interventions Discussed, General Interventions Reviewed, [employer] discussed with patient that he does have a Child psychotherapist available to him if resources are needed. patient declined  at this time.]  Education  Interventions   Education Provided Provided Education  Provided Verbal Education On --  [made sure patient understood wound care]  Pharmacy Interventions   Pharmacy Dicussed/Reviewed Pharmacy Topics Discussed      TOC Interventions Today    Flowsheet Row Most Recent Value  TOC Interventions   TOC Interventions Discussed/Reviewed TOC Interventions Discussed, TOC Interventions Reviewed      Patient agreed to a follow up call next week to check on him.   Gean Maidens BSN RN [facility] [phone]

## 2023-09-30 NOTE — Progress Notes (Signed)
GI Tumor Board patient referral: Status post robotic colectomy for presumed diverticulitis but actually has stage III colon cancer node positive.  Need to discuss in tumor board.  Cory Gomez  [birth year] [id number]  CARE TEAM: Patient Care Team: [name], MD as PCP - General (Family Medicine) Karie Soda, MD as Consulting Physician (General Surgery)  Diagnosis: Sigmoid: cancer.Cory Estelle  MD Care Team:  Karie Soda, MD - surgery Dr. Marina Goodell with gastroenterology.  Sure this young patient is going to need to see medical oncology to see about post adjuvant therapy given node positivity.  Focus of discussion: Radiology & Pathology reviews - comprehensive   Please send to GI Tumor Coordinator Clydie Braun [place])  in [place] message and attach the medical record to it

## 2023-10-01 ENCOUNTER — Other Ambulatory Visit: Payer: Self-pay | Admitting: Surgery

## 2023-10-01 ENCOUNTER — Encounter: Payer: Self-pay | Admitting: Surgery

## 2023-10-01 DIAGNOSIS — C187 Malignant neoplasm of sigmoid colon: Secondary | ICD-10-CM

## 2023-10-01 DIAGNOSIS — K5792 Diverticulitis of intestine, part unspecified, without perforation or abscess without bleeding: Secondary | ICD-10-CM

## 2023-10-01 NOTE — Progress Notes (Signed)
I spoke with Cory Gomez.  He is agreeable to consultation on 10/16 at 1100 with Vincent Gros, DNP and Dr Mosetta Putt.  He is aware to arrive at 1045 for registration purposes.  All questions were answered.  He verbalized understanding.

## 2023-10-02 ENCOUNTER — Ambulatory Visit
Admission: RE | Admit: 2023-10-02 | Discharge: 2023-10-02 | Disposition: A | Discharge status: Home / Self Care | Payer: BC Managed Care – PPO | Source: Ambulatory Visit | Attending: Surgery | Admitting: Surgery

## 2023-10-02 DIAGNOSIS — K5792 Diverticulitis of intestine, part unspecified, without perforation or abscess without bleeding: Secondary | ICD-10-CM

## 2023-10-02 DIAGNOSIS — C189 Malignant neoplasm of colon, unspecified: Secondary | ICD-10-CM | POA: Diagnosis not present

## 2023-10-02 DIAGNOSIS — C187 Malignant neoplasm of sigmoid colon: Secondary | ICD-10-CM

## 2023-10-02 LAB — SURGICAL PATHOLOGY

## 2023-10-02 MED ORDER — IOPAMIDOL (ISOVUE-300) INJECTION 61%
75.0000 mL | Freq: Once | INTRAVENOUS | Status: AC | PRN
  Administered 2023-10-02: 75 mL via INTRAVENOUS

## 2023-10-02 NOTE — Progress Notes (Unsigned)
BH MD/PA/NP OP Progress Note  10/02/2023 12:12 PM Cory Gomez  MRN:  [id number]  Visit Diagnosis:  No diagnosis found.    [name] is a 42 y.o. male with a history of MDD, GAD, ADHD, binge eating disorder, and Vit D deficiency who presented to [facility] for initial evaluation on 01/15/2023.  At initial evaluation patient reported neurovegetative symptoms of depression including low mood, anhedonia, amotivation, worthlessness, insomnia, increased appetite, and occasional passive SI.  He denied any intent or plan.  Patient was able to complete a safety plan and lists his mother as a good support.  Patient furthermore endorsed symptoms of anxiety including feeling nervous or on edge, being unable to stop or control his worrying, difficulty relaxing, increased irritability, restlessness, and fears that something awful might happen.  Psychosocially patient has increased stress from financial struggles, difficulty at work, and interpersonal struggles in his marriage.  Patient meets criteria for MDD and generalized anxiety disorder with further clarity needed to rule out ADHD.  He was referred for neuropsych testing.  Of note due to patient's sleep troubles and body habitus there is some concern for sleep apnea and he was referred to a sleep specialist.  Cory Gomez presents for follow-up evaluation. Today, 10/02/23, patient reports that    his mood, anxiety, and insomnia are well controlled.  There was a dip in mood in the interim secondary to an emergency hospitalization for diverticulitis however patient's mood has improved since discharge.  He is remaining upbeat and keeping a positive outlook on things.  Patient has had increased the frequency of Lunesta back to every night since his hospitalization however is setting a goal to decrease it back down to as needed in the future.  Has tolerated the remainder of medication  regimen well and denies any adverse side effects. Will continue on his current regimen and follow-up in 2 months.  Plan: - Continue Cymbalta 90 mg QD - Continue Wellbutrin XL 150 mg QD - Continue Lunesta 2 mg QHS as needed for insomnia - Neuropsych referral - Sleep specialist appointment upcoming  - Continue therapy with Ambrose Mantle twice a month - Crisis resources reviewed - Follow up in 2 months  Chief Complaint:  No chief complaint on file.  HPI: Cory Gomez presents reporting that     there was a bit of damper on his mood as he spent 10 days in the hospital diverticulitis. It was a rough time and now he is scheduled for surgery for it in October to repair the damage to his colon.  While in the hospital he had to take Ambien send Lunesta for sleep and notes that there was a bit of a decline.  Things are improving now though due to the pain he has started taking Lunesta every night again.  Prior to this Cory Gomez had only been needing the medication every other night.  He does have a goal of getting back to that schedule as his pain symptoms improved.  Since being discharged from the hospital his mood has been improving and he is staying positive.  Patient is using rumor as a defense mechanism.  He does not want to let this incident push him back to where he was prior to starting medications and therapy.  At home things are about the same, but he decided that he is going to focus on himself, for trying to change anyone else.  He does see the relationship his  wife improving in the future.  She has been a good support during his recovery.  Things have also been tight financially since he has been out of work but she has been working hard to help provide.  He has also just started short-term disability today and is scheduled to go till 8/17 at the shortest before returning to work.  Patient would like to continue on his current medication regimen as he feels working well and he denies any adverse  side effects.  Past Psychiatric History: First episode of depression was in early elementary school after his parents divorced. He was in counseling and prescribed bupropion then. Later, as an adult, he was briefly on fluoxetine with no clear benefit. While in school he was diagnosed with having ADHD inattentive type and was on Ritalin. He had seen Dr. Hinton Dyer from 2020-2022. He has no hx of inpatient psychiatric admissions, no hx of mania or overt psychosis. Denies any prior suicide attempts.   Has tried Adderall, Vyvanse, Ritalin, bupropion, Prozac, Zoloft, Cymbalta, trazodone, Zolpidem, topirimate, Lunesta   He does not abuse alcohol or street drugs.  Past Medical History:  Past Medical History:  Diagnosis Date   ADHD (attention deficit hyperactivity disorder)    Annual physical exam 09/09/2019   Anxiety and depression    COVID-19    12/28/19   Depression, recurrent (HCC) 09/09/2019   Elevated liver enzymes 06/11/2019   Excessive ear wax, left 09/09/2019   Headache 12/31/2021   History of kidney stones    Hypertriglyceridemia 07/17/2021   Insomnia    Insomnia    Inverted nipple 09/16/2019   Kidney stone    Mastoiditis of both sides    Nystagmus 01/22/2022   Obesity (BMI 30-39.9)    Obesity (BMI 30-39.9)    Prediabetes    Suicidal ideation 07/14/2021   Vertigo     Past Surgical History:  Procedure Laterality Date   NO PAST SURGERIES     PROCTOSCOPY N/A 09/25/2023   Procedure: RIGID PROCTOSCOPY;  Surgeon: Karie Soda, MD;  Location: [facility];  Service: General;  Laterality: N/A;    Family History:  Family History  Problem Relation Age of Onset   Diabetes Mother    Hypertension Mother    Hyperlipidemia Mother    Diabetes Mellitus II Mother    Anxiety disorder Mother    Depression Mother    Stroke Father    Alcoholism Father    Anxiety disorder Brother    Depression Brother    Hypertension Brother    Hyperlipidemia Brother    Diabetes type II Brother    ADD /  ADHD Brother    Dementia Maternal Grandfather    Stomach cancer Neg Hx    Colon cancer Neg Hx    Esophageal cancer Neg Hx    Pancreatic cancer Neg Hx     Social History:  Social History   Socioeconomic History   Marital status: Married    Spouse name: Not on file   Number of children: Not on file   Years of education: Not on file   Highest education level: Not on file  Occupational History   Not on file  Tobacco Use   Smoking status: Never   Smokeless tobacco: Never  Vaping Use   Vaping status: Never Used  Substance and Sexual Activity   Alcohol use: Never   Drug use: Never   Sexual activity: Yes    Partners: Female  Other Topics Concern   Not on file  Social  History Narrative   Married no kids wife is Winn Muehl    -wife and mother [name] on [place]      Has siblings    Works Educational psychologist IT [employer]   1 dog    Social Determinants of Corporate investment banker Strain: Not on file  Food Insecurity: Food Insecurity Present (09/30/2023)   Hunger Vital Sign    Worried About Running Out of Food in the Last Year: Sometimes true    Ran Out of Food in the Last Year: Sometimes true  Transportation Needs: No Transportation Needs (09/30/2023)   PRAPARE - Administrator, Civil Service (Medical): No    Lack of Transportation (Non-Medical): No  Physical Activity: Not on file  Stress: Not on file  Social Connections: Not on file    Allergies: No Known Allergies  Current Medications: Current Outpatient Medications  Medication Sig Dispense Refill   buPROPion (WELLBUTRIN XL) 150 MG 24 hr tablet Take 1 tablet (150 mg total) by mouth every morning. 30 tablet 2   cyclobenzaprine (FLEXERIL) 10 MG tablet Take 1 tablet (10 mg total) by mouth 3 (three) times daily as needed for muscle spasms. 15 tablet 1   DULoxetine (CYMBALTA) 30 MG capsule Take 3 capsules (90 mg total) by mouth daily. 90 capsule 2   eszopiclone (LUNESTA) 2 MG TABS tablet Take 1 tablet (2 mg  total) by mouth at bedtime as needed for sleep. Take immediately before bedtime 30 tablet 2   oxyCODONE (OXY IR/ROXICODONE) 5 MG immediate release tablet Take 1 tablet (5 mg total) by mouth every 6 (six) hours as needed for moderate pain, severe pain or breakthrough pain. 20 tablet 0   No current facility-administered medications for this visit.     Psychiatric Specialty Exam: Review of Systems  There were no vitals taken for this visit.There is no height or weight on file to calculate BMI.  General Appearance: Well Groomed  Eye Contact:  Good  Speech:  Clear and Coherent and Normal Rate  Volume:  Normal  Mood:  Euthymic  Affect:  Congruent  Thought Process:  Coherent and Goal Directed  Orientation:  Full (Time, Place, and Person)  Thought Content: Logical   Suicidal Thoughts:  No  Homicidal Thoughts:  No  Memory:  NA  Judgement:  Fair  Insight:  Fair  Psychomotor Activity:  Normal  Concentration:  Concentration: Good  Recall:  Good  Fund of Knowledge: Fair  Language: Good  Akathisia:  NA    AIMS (if indicated): not done  Assets:  Passenger transport manager Vocational/Educational  ADL's:  Intact  Cognition: WNL  Sleep:  Good   Metabolic Disorder Labs: Lab Results  Component Value Date   HGBA1C 5.7 (H) 09/25/2023   MPG 116.89 09/25/2023   No results found for: "PROLACTIN" Lab Results  Component Value Date   CHOL 187 05/08/2023   TRIG 178.0 (H) 05/08/2023   HDL 33.80 (L) 05/08/2023   CHOLHDL 6 05/08/2023   VLDL 35.6 05/08/2023   LDLCALC 117 (H) 05/08/2023   LDLCALC 102 (H) 06/11/2019   Lab Results  Component Value Date   TSH 3.16 05/08/2023   TSH 2.91 06/14/2021    Therapeutic Level Labs: No results found for: "LITHIUM" No results found for: "VALPROATE" No results found for: "CBMZ"   Screenings: GAD-7    Flowsheet Row Office Visit from 07/18/2023 in [facility] Visit  from 05/29/2023 in  [facility] Visit from 03/12/2023 in [facility] Visit from 01/15/2023 in [facility] Office Visit from 12/03/2022 in [facility]  Total GAD-7 Score 13 6 8 18 20       PHQ2-9    Flowsheet Row Office Visit from 07/18/2023 in [facility] Visit from 05/29/2023 in [facility] Visit from 03/12/2023 in [facility] Visit from 01/15/2023 in [facility] Office Visit from 12/03/2022 in [facility] Total Score 2 2 2 5 5   PHQ-9 Total Score 7 10 5 19 23       Flowsheet Row Admission (Discharged) from 09/25/2023 in [facility] [place] [facility] Pre-Admission Testing 60 from 09/18/2023 in [facility] to Hosp-Admission (Discharged) from 07/06/2023 in [facility] [place] [facility]  C-SSRS RISK CATEGORY No Risk No Risk No Risk       Collaboration of Care: Collaboration of Care: Medication Management AEB medication prescription, Other provider involved in patient's care AEB Gen surgery and gastro chart review, and Referral or follow-up with counselor/therapist AEB chart review  Patient/Guardian was advised Release of Information must be obtained prior to any record release in order to collaborate their care with an outside provider. Patient/Guardian was advised if they have not already done so to contact the registration department to sign all necessary forms in order for [place] to release information regarding their care.   Consent: Patient/Guardian gives verbal consent for treatment and assignment of benefits for services provided during this visit. Patient/Guardian  expressed understanding and agreed to proceed.    Stasia Cavalier, MD 10/02/2023, 12:12 PM   Virtual Visit via Video Note  I connected with Margaretha Seeds on 10/02/23 at  8:00 AM EDT by a video enabled telemedicine application and verified that I am speaking with the correct person using two identifiers.  Location: Patient: Home Provider: Home Office   I discussed the limitations of evaluation and management by telemedicine and the availability of in person appointments. The patient expressed understanding and agreed to proceed.   I discussed the assessment and treatment plan with the patient. The patient was provided an opportunity to ask questions and all were answered. The patient agreed with the plan and demonstrated an understanding of the instructions.   The patient was advised to call back or seek an in-person evaluation if the symptoms worsen or if the condition fails to improve as anticipated.  I provided 15 minutes of non-face-to-face time during this encounter.   Stasia Cavalier, MD

## 2023-10-03 ENCOUNTER — Telehealth (HOSPITAL_COMMUNITY): Payer: BC Managed Care – PPO | Admitting: Psychiatry

## 2023-10-03 ENCOUNTER — Encounter (HOSPITAL_COMMUNITY): Payer: Self-pay | Admitting: Psychiatry

## 2023-10-03 ENCOUNTER — Ambulatory Visit (HOSPITAL_COMMUNITY): Payer: BC Managed Care – PPO | Admitting: Clinical

## 2023-10-03 ENCOUNTER — Encounter (HOSPITAL_COMMUNITY): Payer: Self-pay | Admitting: Clinical

## 2023-10-03 DIAGNOSIS — F330 Major depressive disorder, recurrent, mild: Secondary | ICD-10-CM

## 2023-10-03 DIAGNOSIS — F411 Generalized anxiety disorder: Secondary | ICD-10-CM

## 2023-10-03 MED ORDER — DULOXETINE HCL 30 MG PO CPEP: 90.0000 mg | ORAL_CAPSULE | Freq: Every day | ORAL | 2 refills | Status: DC

## 2023-10-03 MED ORDER — BUPROPION HCL ER (XL) 150 MG PO TB24
150.0000 mg | ORAL_TABLET | ORAL | 2 refills | Status: DC
Start: 2023-10-03 — End: 2023-11-29

## 2023-10-03 NOTE — Progress Notes (Signed)
THERAPIST PROGRESS NOTE  Session Time:  10:03am-11:02am  Session #9  Participation Level: Active  Behavioral Response: Casual Alert Euthymic   Type of Therapy: Individual Therapy  Treatment Goals addressed:  New treatment plan established:  Problem: Anxiety Goal: LTG: Skylar "[name]" will score less than 5 on the Generalized Anxiety Disorder 7 Scale (GAD-7) Goal: STG: Theran "[name]" will reduce frequency of avoidant behaviors by 50% as evidenced by self-report in therapy sessions Goal: LTG: Learn a variety of coping skills and demonstrate the ability to use them to decrease feelings of sadness, anger, and fear and increase feelings of happiness, peace, and powerfulness AEB gauging those emotions on 1-10 scale. Goal: LTG: Learn breathing techniques and grounding techniques at an age-appropriate level and demonstrate mastery in session then report independent use of these skills out of session. Intervention: Work with Jozsef "[name]" to identify a minimum of 3 consequences of avoidance. Intervention: Work with Dajon "[name]" to identify a minimum of 3 alternative coping behaviors to avoidance. Intervention: Continue cognitive-behavioral therapy for anxiety and depression Intervention: Educate patient on relaxation techniques and the rationale for learning these techniques (including breathing skills, grounding exercises, and mindfulness practice) Intervention: Review GAD-7 scores with [name] to demonstrate progress on anxiety goals.  Problem: OP Depression Goal: LTG: Reduce frequency, intensity, and duration of depression symptoms so that daily functioning is improved Goal: STG: Reduce overall depression score to no more than 9 on the Patient Health Questionnaire (PHQ-9) Goal: LTG: Explore personal core beliefs, rules and assumptions, and cognitive distortions through therapist using Cognitive Behavioral Therapy; learn how to develop replacement thoughts and challenge unhelpful thoughts. Goal: LTG: Learn  additional communication techniques that can assist with better navigating relationships. Goal: LTG: Learn the types of boundaries, the necessity, and how to implement boundaries even when it is difficult Goal: LTG: Review PHQ-9 scores to determine and celebrate progress. Intervention: Therapist will educate patient on cognitive distortions and the rationale for  Intervention: Help [name] identify 5 cognitive distortions they are currently using and write reframing statements to replace them Intervention: Instruct [name] to review pleasant activities list and select 2 activities to practice weekly for the next 26 weeks Intervention: Teach coping skills to increase resilience using CBT, shame work, MI, DBT, anger management, forgiveness, boundary setting, and a variety of other strategies to meet goals of decreasing depression, anxiety, and trauma-related symptoms. Intervention: Provide psychoeducation on communication techniques such as "I" statements, open-ended questions, reflective listening, assertiveness, fair fighting rules, and more as needed.  ProgressTowards Goals: Progressing  Interventions: Supportive   Summary: Treshawn Allen is a 42 y.o. male who presents with MDD/severe, GAD, insomnia, and relationship problems with his wife.  He has had his abdominal surgery and unfortunately has also found out he has colon cancer.  He would like to avoid chemotherapy if possible and will get all of his options presented to him [facility] at [facility].  He is struggling with feelings of uselessness because he is not working.  He is having some problems sleeping right now because he is worried and cannot shut his mind off.  He feels that his wife has been very understanding and perhaps this can serve to bring them closer together.  She said, when first told, that she does not want to lose him which is quite a change from her earlier offer on a divorce.  She loves her job at this time, which is quite  unusual, but he feels like she will be willing to keep  working so that between her income and his short-term then long-term disability payments, they will be able to cover their bills.  She has been put on [facility] and is being referred for psychiatry and he believes she will take her medicine, so he is very hopeful at this time.  We talked about a variety of ways to shut off his mind at night, including using the alphabet to create a gratitude list, using thought stopping, and scheduling worry time.  These were taught to him in depth and he was given handouts about each.    We also talked about various hobbies that might interest him while he is unable to work during cancer treatments.  We discussed hobbies he has enjoyed in the past and what he might seek out now.  He did use the 5-finger breathing prior to his surgery, found it helpful.  Suicidal/Homicidal: No without intent  Therapist Response:  Patient is progressing AEB engaging in scheduled therapy session.  He presented oriented x5 and stated he was feeling "a little down and overwhelmed."  CSW evaluated patient's medication compliance, use of coping tools, and self-care, as applicable.  Patient stated he has unfortunately been diagnosed with cancer.  CSW taught Thought Stopping and Scheduling Worry Time techniques, in addition to others.  Patient received this willingly and indicated fair comprehension.  CSW assigned patient homework of trying out these new skills.  CSW encouraged patient to schedule more therapy sessions for the future, as there are none currently in place.  The first he could get was 12/9.  Throughout the session, CSW gave patient the opportunity to explore thoughts and feelings associated with current life situations and past/present stressors.   CSW challenged patient gently and appropriately to consider different ways of looking at reported issues. CSW encouraged patient's expression of feelings and validated  these using empathy, active listening, open body language, and unconditional positive regard.       Recommendations:  Return to therapy in 2 weeks, engage in self care behaviors, continue to think about marriage and what he needs from wife, as well as whether he can give her what she wants from him  Plan: Return again at first available appointment, then every 3 weeks  Next appointment:  12/9  Diagnosis:  Encounter Diagnoses  Name Primary?   Major depressive disorder, recurrent episode, mild (HCC) Yes   GAD (generalized anxiety disorder)     Collaboration of Care: Psychiatrist AEB - psychiatrist and therapist both have access to notes in Epic  Patient/Guardian was advised Release of Information must be obtained prior to any record release in order to collaborate their care with an outside provider. Patient/Guardian was advised if they have not already done so to contact the registration department to sign all necessary forms in order for [place] to release information regarding their care.   Consent: Patient/Guardian gives verbal consent for treatment and assignment of benefits for services provided during this visit. Patient/Guardian expressed understanding and agreed to proceed.   Lynnell Chad, LCSW 10/03/2023

## 2023-10-08 DIAGNOSIS — C187 Malignant neoplasm of sigmoid colon: Secondary | ICD-10-CM | POA: Diagnosis not present

## 2023-10-08 DIAGNOSIS — K5792 Diverticulitis of intestine, part unspecified, without perforation or abscess without bleeding: Secondary | ICD-10-CM | POA: Diagnosis not present

## 2023-10-08 NOTE — Progress Notes (Unsigned)
[facility]  [phone]) [phone]   HEMATOLOGY/ONCOLOGY NEW PATIENT CONSULTATION   Cory Gomez  DOB: [birth year]  MR#: [id number]  CSN#: [id number]    Requesting Physician: [facility]  Patient Care Team: [name], MD as PCP - General (Family Medicine) Karie Soda, MD as Consulting Physician (General Surgery)  Reason for consult: cancer of sigmoid colon   History of present illness:   7/20224 - presented with perforated diverticulitis in sigmoid colon. Hospitalized for 8 days due to pericolonic abscess.  09/19/2023 - GI referral due to complicated diverticulitis. Colonoscopy attempted on 09/24/2023 but was aborted due to poor preparation of the colon. 09/25/2023 - patient had laparoscopic resection of sigmoid colon, removing the damaged section of colon. Pathology of removed section of colon showed invasive, moderately differentiated adenocarcinoma (pT4, pN1) with 1/45 lymph nodes positive for cancer. Margins were clear. There was no other evidence of cancer in abdomen or pelvis. He had CT chest on 10/02/2023 which was negative. No evidence of thoracic metastatic disease or other significant abnormality.  MEDICAL HISTORY:  Past Medical History:  Diagnosis Date   ADHD (attention deficit hyperactivity disorder)    Annual physical exam 09/09/2019   Anxiety and depression    COVID-19    12/28/19   Depression, recurrent (HCC) 09/09/2019   Elevated liver enzymes 06/11/2019   Excessive ear wax, left 09/09/2019   Headache 12/31/2021   History of kidney stones    Hypertriglyceridemia 07/17/2021   Insomnia    Insomnia    Inverted nipple 09/16/2019   Kidney stone    Mastoiditis of both sides    Nystagmus 01/22/2022   Obesity (BMI 30-39.9)    Obesity (BMI 30-39.9)    Prediabetes    Suicidal ideation 07/14/2021   Vertigo     SURGICAL HISTORY: Past Surgical History:  Procedure Laterality Date   NO PAST SURGERIES     PROCTOSCOPY N/A 09/25/2023    Procedure: RIGID PROCTOSCOPY;  Surgeon: Karie Soda, MD;  Location: [facility];  Service: General;  Laterality: N/A;    SOCIAL HISTORY: Social History   Socioeconomic History   Marital status: Married    Spouse name: Not on file   Number of children: Not on file   Years of education: Not on file   Highest education level: Not on file  Occupational History   Not on file  Tobacco Use   Smoking status: Never   Smokeless tobacco: Never  Vaping Use   Vaping status: Never Used  Substance and Sexual Activity   Alcohol use: Never   Drug use: Never   Sexual activity: Yes    Partners: Female  Other Topics Concern   Not on file  Social History Narrative   Married no kids wife is Cory Gomez    -wife and mother [name] on [place]      Has siblings    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   1 dog    Social Determinants of Health   Financial Resource Strain: Not on file  Food Insecurity: Food Insecurity Present (09/30/2023)   Hunger Vital Sign    Worried About Running Out of Food in the Last Year: Sometimes true    Ran Out of Food in the Last Year: Sometimes true  Transportation Needs: No Transportation Needs (09/30/2023)   PRAPARE - Administrator, Civil Service (Medical): No    Lack of Transportation (Non-Medical): No  Physical Activity: Not on file  Stress: Not on  file  Social Connections: Not on file  Intimate Partner Violence: Not At Risk (09/26/2023)   Humiliation, Afraid, Rape, and Kick questionnaire    Fear of Current or Ex-Partner: No    Emotionally Abused: No    Physically Abused: No    Sexually Abused: No    FAMILY HISTORY: Family History  Problem Relation Age of Onset   Diabetes Mother    Hypertension Mother    Hyperlipidemia Mother    Diabetes Mellitus II Mother    Anxiety disorder Mother    Depression Mother    Stroke Father    Alcoholism Father    Anxiety disorder Brother    Depression Brother    Hypertension Brother    Hyperlipidemia Brother     Diabetes type II Brother    ADD / ADHD Brother    Dementia Maternal Grandfather    Stomach cancer Neg Hx    Colon cancer Neg Hx    Esophageal cancer Neg Hx    Pancreatic cancer Neg Hx     ALLERGIES:  has No Known Allergies.  MEDICATIONS:  Current Outpatient Medications  Medication Sig Dispense Refill   buPROPion (WELLBUTRIN XL) 150 MG 24 hr tablet Take 1 tablet (150 mg total) by mouth every morning. 30 tablet 2   cyclobenzaprine (FLEXERIL) 10 MG tablet Take 1 tablet (10 mg total) by mouth 3 (three) times daily as needed for muscle spasms. 15 tablet 1   DULoxetine (CYMBALTA) 30 MG capsule Take 3 capsules (90 mg total) by mouth daily. 90 capsule 2   eszopiclone (LUNESTA) 2 MG TABS tablet Take 1 tablet (2 mg total) by mouth at bedtime as needed for sleep. Take immediately before bedtime 30 tablet 2   oxyCODONE (OXY IR/ROXICODONE) 5 MG immediate release tablet Take 1 tablet (5 mg total) by mouth every 6 (six) hours as needed for moderate pain, severe pain or breakthrough pain. (Patient not taking: Reported on 10/09/2023) 20 tablet 0   No current facility-administered medications for this visit.    REVIEW OF SYSTEMS:   Constitutional: Denies fevers, chills or abnormal night sweats Eyes: Denies blurriness of vision, double vision or watery eyes Ears, nose, mouth, throat, and face: Denies mucositis or sore throat Respiratory: Denies cough, dyspnea or wheezes Cardiovascular: Denies palpitation, chest discomfort or lower extremity swelling Gastrointestinal:  Denies nausea, heartburn or change in bowel habits Skin: Denies abnormal skin rashes Lymphatics: Denies new lymphadenopathy or easy bruising Neurological:Denies numbness, tingling or new weaknesses Behavioral/Psych: Mood is stable, no new changes  All other systems were reviewed with the patient and are negative.  PHYSICAL EXAMINATION: ECOG PERFORMANCE STATUS: 1 - Symptomatic but completely ambulatory  Vitals:   10/09/23 1131   BP: 139/86  Pulse: 99  Resp: 18  Temp: 98 F (36.7 C)  SpO2: 100%   Filed Weights   10/09/23 1131  Weight: 275 lb 8 oz (125 kg)    GENERAL:alert, no distress and comfortable SKIN: skin color, texture, turgor are normal, no rashes or significant lesions EYES: normal, conjunctiva are pink and non-injected, sclera clear OROPHARYNX:no exudate, no erythema and lips, buccal mucosa, and tongue normal  NECK: supple, thyroid normal size, non-tender, without nodularity LYMPH:  no palpable lymphadenopathy in the cervical, axillary or inguinal LUNGS: clear to auscultation and percussion with normal breathing effort HEART: regular rate & rhythm and no murmurs and no lower extremity edema ABDOMEN:abdomen soft, non-tender and normal bowel sounds Musculoskeletal:no cyanosis of digits and no clubbing  PSYCH: alert & oriented x 3  with fluent speech NEURO: no focal motor/sensory deficits  LABORATORY DATA:  I have reviewed the data as listed Lab Results  Component Value Date   WBC 10.2 09/26/2023   HGB 9.5 (L) 09/26/2023   HCT 31.3 (L) 09/26/2023   MCV 81.3 09/26/2023   PLT 278 09/26/2023   Recent Labs    05/08/23 0802 07/06/23 1236 07/06/23 2015 07/13/23 0437 09/18/23 1007 09/26/23 0436  NA 137 135   < > 138 134* 137  K 4.0 3.5   < > 3.7 3.9 3.6  CL 103 104   < > 104 100 101  CO2 27 24   < > 24 23 24   GLUCOSE 99 106*   < > 91 100* 111*  BUN 10 15   < > 10 11 8   CREATININE 0.92 0.78   < > 0.77 0.74 0.93  CALCIUM 9.4 8.5*   < > 8.6* 9.2 8.6*  GFRNONAA  --  >60   < > >60 >60 >60  PROT 7.6 7.0  --   --  8.1  --   ALBUMIN 4.3 3.2*  --   --  4.0  --   AST 16 18  --   --  15  --   ALT 17 23  --   --  15  --   ALKPHOS 56 52  --   --  57  --   BILITOT 0.6 0.4  --   --  0.6  --    < > = values in this interval not displayed.    RADIOGRAPHIC STUDIES: I have personally reviewed the radiological images as listed and agreed with the findings in the report. CT CHEST W  CONTRAST  Result Date: 10/08/2023 CLINICAL DATA:  Newly diagnosed colon carcinoma. Staging. * Tracking Code: BO * EXAM: CT CHEST WITH CONTRAST TECHNIQUE: Multidetector CT imaging of the chest was performed during intravenous contrast administration. RADIATION DOSE REDUCTION: This exam was performed according to the departmental dose-optimization program which includes automated exposure control, adjustment of the mA and/or kV according to patient size and/or use of iterative reconstruction technique. CONTRAST:  75mL ISOVUE-300 IOPAMIDOL (ISOVUE-300) INJECTION 61% COMPARISON:  None Available. FINDINGS: Cardiovascular:  No acute findings. Mediastinum/Nodes: No masses or pathologically enlarged lymph nodes identified. Lungs/Pleura: No suspicious nodules or masses identified. No evidence of pulmonary infiltrate or pleural effusion. Upper Abdomen:  Unremarkable. Musculoskeletal:  No suspicious bone lesions. IMPRESSION: Negative. No evidence of thoracic metastatic disease or other significant abnormality. Electronically Signed   By: Danae Orleans M.D.   On: 10/08/2023 08:04    ASSESSMENT & PLAN:   1. Cancer of sigmoid colon - pT4a, pN1a - s/p robotic LAR resection 09/25/2023 Long discussion with patient and family regarding hospital course, surgery, labs, and findings. Patient voices understanding of diagnosis. Different options for treatment discussed. Patient and family have opted to try FOLFOX every 2 weeks for the next 6 months. They understand that the goal of treatment is curative. They understand that there is high risk of recurrence and are amenable to close surveillance.  Referral placed to genetic counseling for further testing. Request made to surgery to have port-a-cath placed for IV chemotherapy. Will have patient scheduled for chemotherapy education as soon as possible. It is recommended that treatment with chemotherapy begin in the next 2 to 3 weeks.    All questions were answered. The patient knows  to call the clinic with any problems, questions or concerns.      Kathlynn Grate  Danen Lapaglia, NP 10/09/2023 2:25 PM  Addendum I have seen the patient, examined him. I agree with the assessment and and plan and have edited the notes.   42 year old gentleman without a significant past medical history, presented with perforated sigmoid colon.  I reviewed his surgical pathology, which showed T4a sigmoid colon cancer and 1 positive lymph nodes.  I discussed the high risk of recurrence after surgery due to his stage IIIb disease, especially with perforation.  I recommend adjuvant chemotherapy for 6 months.  I discussed option of FOLFOX and CapeOx in detail, he opted FOLFOX.  He has recovered well from surgery, plan to start chemotherapy in about 2 weeks.  Will reach out to Dr. Michaell Cowing for port placement.  Will schedule class.  I will see him back with first treatment.  All questions were answered.  I spent a total of 45 minutes for his visit today, more than 20% time on face-to-face counseling.  Malachy Mood MD 10/09/2023

## 2023-10-09 ENCOUNTER — Inpatient Hospital Stay: Payer: BC Managed Care – PPO | Admitting: Licensed Clinical Social Worker

## 2023-10-09 ENCOUNTER — Inpatient Hospital Stay: Payer: BC Managed Care – PPO

## 2023-10-09 ENCOUNTER — Ambulatory Visit: Payer: Self-pay | Admitting: *Deleted

## 2023-10-09 ENCOUNTER — Other Ambulatory Visit: Payer: Self-pay

## 2023-10-09 ENCOUNTER — Encounter: Payer: Self-pay | Admitting: Hematology

## 2023-10-09 ENCOUNTER — Inpatient Hospital Stay: Payer: BC Managed Care – PPO | Attending: Nurse Practitioner | Admitting: Nurse Practitioner

## 2023-10-09 VITALS — BP 139/86 | HR 99 | Temp 98.0°F | Resp 18 | Ht 74.0 in | Wt 275.5 lb

## 2023-10-09 DIAGNOSIS — C187 Malignant neoplasm of sigmoid colon: Secondary | ICD-10-CM | POA: Insufficient documentation

## 2023-10-09 DIAGNOSIS — Z5111 Encounter for antineoplastic chemotherapy: Secondary | ICD-10-CM | POA: Diagnosis not present

## 2023-10-09 DIAGNOSIS — K5720 Diverticulitis of large intestine with perforation and abscess without bleeding: Secondary | ICD-10-CM | POA: Diagnosis not present

## 2023-10-09 MED ORDER — PROCHLORPERAZINE MALEATE 10 MG PO TABS: 10.0000 mg | ORAL_TABLET | Freq: Four times a day (QID) | ORAL | 1 refills | Status: AC | PRN

## 2023-10-09 MED ORDER — LIDOCAINE-PRILOCAINE 2.5-2.5 % EX CREA: TOPICAL_CREAM | CUTANEOUS | 3 refills | Status: AC

## 2023-10-09 MED ORDER — ONDANSETRON HCL 8 MG PO TABS: 8.0000 mg | ORAL_TABLET | Freq: Three times a day (TID) | ORAL | 1 refills | Status: DC | PRN

## 2023-10-09 NOTE — Progress Notes (Signed)
I met with Cory Gomez, his wife, and other family members after  his consultation with Vincent Gros, DNP and Dr Mosetta Putt. I explained the services provided at [facility] and provided written information.  I briefly explained insertion and care of a port a cath.  I told him we have asked Dr Michaell Cowing to place the port.  He should receive a call from his office to schedule this. I showed a sample of the port a cath.  I told him that he will be scheduled for chemotherapy education class prior to receiving chemotherapy.  I told him our schedulers will call him with those appts.  All questions were answered. He verbalized understanding.

## 2023-10-09 NOTE — Assessment & Plan Note (Addendum)
7/20224 - presented with perforated diverticulitis in sigmoid colon. Hospitalized for 7 days due to pericolonic abscess.  09/19/2023 - GI referral due to complicated diverticulitis. Colonoscopy attempted on 09/24/2023 but was aborted due to poor preparation of the colon. 09/25/2023 - patient had laparoscopic resection of sigmoid colon, removing the damaged section of colon. Pathology of removed section of colon showed invasive, moderately differentiated adenocarcinoma (pT4, pN1) with 1/45 lymph nodes positive for cancer. Margins were clear. There was no other evidence of cancer in abdomen or pelvis. He had CT chest on 10/02/2023 which was negative. No evidence of thoracic metastatic disease or other significant abnormality. 10/09/2023 - plan to start adjuvant chemotherapy with FOLFOX.  -referred for chemotherapy education class. --will see Dr. Michaell Cowing, surgery, or colleague, to place port for chemotherapy administration.  -referred to genetics for further evaluation.  -patient met with nurse navigator and social work during today's visit.  -plan is to start chemotherapy around the time of 10/23/2023.

## 2023-10-09 NOTE — Progress Notes (Signed)
The proposed treatment discussed in conference is for discussion purpose only and is not a binding recommendation.  The patients have not been physically examined, or presented with their treatment options.  Therefore, final treatment plans cannot be decided.  

## 2023-10-09 NOTE — Patient Outreach (Signed)
Care Management  Transitions of Care Program Transitions of Care Post-discharge week 2   10/09/2023 Name: Cory Gomez MRN: [id number] DOB: Jul 31, 1981  Subjective: Cory Gomez is a 42 y.o. year old male who is a primary care patient of [name], MD. The Care Management team Engaged with patient Engaged with patient by telephone to assess and address transitions of care needs.   Consent to Services:  Patient was given information about care management services, agreed to services, and gave verbal consent to participate.   Assessment:   RN called to patient. Patient stated he had just wanted Nurse to check on him, He stated he is ok. He has followed up with video visit with surgeon and has been talking with counselor. .He stated that he didn't need any further outreach calls.        SDOH Interventions    Flowsheet Row Telephone from 09/30/2023 in [facility] Coordination Admission (Discharged) from 09/25/2023 in [facility] [place] [facility] Office Visit from 05/29/2023 in [facility] Visit from 12/03/2022 in [facility] [employer] Visit from 01/11/2022 in [facility] [employer] Visit from 07/14/2021 in [facility] at [employer] [facility]  SDOH Interventions        Food Insecurity Interventions Intervention Not Indicated  [per patient today he stated no problem] Inpatient TOC, Other (Comment)  [resource given to patient] -- -- -- --  Housing Interventions Intervention Not Indicated  [Per patioent today no problems and did not need a referral to social worker] Inpatient TOC  [resource given to patient] -- -- -- --  Transportation Interventions Intervention Not Indicated, Patient Resources (Friends/Family) Intervention Not Indicated, Inpatient TOC -- -- -- --  Utilities Interventions -- Inpatient TOC  [resource given  to patient] -- -- -- --  Depression Interventions/Treatment  -- -- Currently on Treatment Currently on Treatment Referral to Psychiatry, Medication Referral to Psychiatry        Goals Addressed   None     Plan: Patient declined further outreach calls.   Gean Maidens BSN RN [facility] [phone]

## 2023-10-09 NOTE — Progress Notes (Signed)
START ON PATHWAY REGIMEN - Colorectal     A cycle is every 14 days:     Oxaliplatin      Leucovorin      Fluorouracil      Fluorouracil   **Always confirm dose/schedule in your pharmacy ordering system**  Patient Characteristics: Postoperative without Neoadjuvant Therapy, M0 (Pathologic Staging), Colon, Stage III, High Risk (pT4 or pN2) Tumor Location: Colon Therapeutic Status: Postoperative without Neoadjuvant Therapy, M0 (Pathologic Staging) AJCC M Category: cM0 AJCC T Category: pT4a AJCC N Category: pN1a AJCC 8 Stage Grouping: IIIB Intent of Therapy: Curative Intent, Discussed with Patient

## 2023-10-10 ENCOUNTER — Encounter: Payer: Self-pay | Admitting: Hematology

## 2023-10-10 ENCOUNTER — Other Ambulatory Visit: Payer: Self-pay

## 2023-10-10 ENCOUNTER — Other Ambulatory Visit: Payer: Self-pay | Admitting: Nurse Practitioner

## 2023-10-10 DIAGNOSIS — C187 Malignant neoplasm of sigmoid colon: Secondary | ICD-10-CM

## 2023-10-10 MED ORDER — ONDANSETRON 8 MG PO TBDP: 8.0000 mg | ORAL_TABLET | Freq: Three times a day (TID) | ORAL | 1 refills | Status: DC | PRN

## 2023-10-11 ENCOUNTER — Other Ambulatory Visit: Payer: Self-pay

## 2023-10-11 ENCOUNTER — Encounter (HOSPITAL_BASED_OUTPATIENT_CLINIC_OR_DEPARTMENT_OTHER): Payer: Self-pay | Admitting: Surgery

## 2023-10-11 ENCOUNTER — Encounter: Payer: Self-pay | Admitting: Hematology

## 2023-10-11 NOTE — Progress Notes (Signed)
[facility] Clinical Social Work  Initial Assessment   Cory Gomez is a 42 y.o. year old male accompanied by patient, mother, friend, and wife. Clinical Social Work was referred by medical provider for assessment of psychosocial needs.   SDOH (Social Determinants of Health) assessments performed: Yes SDOH Interventions    Flowsheet Row Telephone from 09/30/2023 in [facility] Community Care Coordination Admission (Discharged) from 09/25/2023 in [facility] [place] [facility] Office Visit from 05/29/2023 in [facility] [employer] Visit from 12/03/2022 in [facility] at [employer] Visit from 01/11/2022 in [facility] at [employer] Visit from 07/14/2021 in [facility] at [employer] [facility]  SDOH Interventions        Food Insecurity Interventions Intervention Not Indicated  [per patient today he stated no problem] Inpatient TOC, Other (Comment)  [resource given to patient] -- -- -- --  Housing Interventions Intervention Not Indicated  [Per patioent today no problems and did not need a referral to social worker] Inpatient TOC  [resource given to patient] -- -- -- --  Transportation Interventions Intervention Not Indicated, Patient Resources (Friends/Family) Intervention Not Indicated, Inpatient TOC -- -- -- --  Utilities Interventions -- Inpatient TOC  [resource given to patient] -- -- -- --  Depression Interventions/Treatment  -- -- Currently on Treatment Currently on Treatment Referral to Psychiatry, Medication Referral to Psychiatry       SDOH Screenings   Food Insecurity: Food Insecurity Present (09/30/2023)  Housing: Low Risk  (09/30/2023)  Recent Concern: Housing - Medium Risk (09/26/2023)  Transportation Needs: No Transportation Needs (09/30/2023)  Utilities: At Risk (09/26/2023)  Depression (PHQ2-9): Medium Risk (07/18/2023)  Tobacco Use: Low Risk  (10/03/2023)      Distress Screen completed: No     No data to display            Family/Social Information:  Housing Arrangement: patient lives with his wife.   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  Per pt he has run out of short term disability and will now start to receive long term benefits at 60% of his pay.  Pt states he will receive long term disability once a month only and it will delay when he is able to pay his bills. Family members/support persons in your life? Pt's family is very supportive, but not able to offer the financial support pt will need. Transportation concerns: no  Employment: Out on work Corporate treasurer.  Income source: Long-Term Disability Financial concerns: Yes, current concerns Type of concern: Utilities, Rent/ mortgage, and Medical bills Food access concerns: no Religious or spiritual practice:  yes Services Currently in place:  none  Coping/ Adjustment to diagnosis: Patient understands treatment plan and what happens next? yes Concerns about diagnosis and/or treatment: Losing my job and/or losing income, Overwhelmed by information, and How I will pay for the services I need Patient reported stressors: Finances and Adjusting to my illness Hopes and/or priorities: pt's priority is to start chemotherapy w/ the hope of positive results Patient enjoys time with family/ friends Current coping skills/ strengths: Capable of independent living , Motivation for treatment/growth , Physical Health , and Supportive family/friends     SUMMARY: Current SDOH Barriers:  Financial constraints related to loss of income  Clinical Social Work Clinical Goal(s):  Explore community resource options for unmet needs related to:  Financial Strain   Interventions: Discussed common feeling and  emotions when being diagnosed with cancer, and the importance of support during treatment Informed patient of the support team roles and support services  at [facility] Provided CSW contact information and encouraged patient to call with any questions or concerns Provided patient with information about the [facility] and referred to the financial resource specialist.  Provided information regarding [employer] grants to assist w/ insurance premium and co-pay if necessary.   Follow Up Plan: Patient will contact CSW with any support or resource needs Patient verbalizes understanding of plan: Yes    Rachel Moulds, LCSW Clinical Social Worker [facility]

## 2023-10-11 NOTE — Progress Notes (Signed)
Spoke w/ via phone for pre-op interview---pt Lab needs dos---- none, surgery orders req dr gross epic ib        Lab results------EKG 09-18-2023 epic, chest ct 10-02-2023 epic COVID test -----patient states asymptomatic no test needed Arrive at -------745 09-2023 NPO after MN NO Solid Food.  Clear liquids from MN until---645 Med rec completed Medications to take morning of surgery -----bupropion, duloxetine Diabetic medication -----n/a Patient instructed no nail polish to be worn day of surgery Patient instructed to bring photo id and insurance card day of surgery Patient aware to have Driver (ride ) / caregiver    for 24 hours after surgery [name] -  Patient Special Instructions -----none Pre-Op special Instructions -----none Patient verbalized understanding of instructions that were given at this phone interview. Patient denies chest pain, sob, fever, cough at the interview.

## 2023-10-14 NOTE — Progress Notes (Signed)

## 2023-10-16 ENCOUNTER — Ambulatory Visit: Payer: Self-pay | Admitting: Surgery

## 2023-10-16 LAB — MOLECULAR PATHOLOGY

## 2023-10-17 ENCOUNTER — Other Ambulatory Visit: Payer: Self-pay

## 2023-10-17 ENCOUNTER — Encounter (HOSPITAL_BASED_OUTPATIENT_CLINIC_OR_DEPARTMENT_OTHER)
Admission: RE | Disposition: A | Discharge status: Home / Self Care | Payer: Self-pay | Source: Home / Self Care | Attending: Surgery

## 2023-10-17 ENCOUNTER — Ambulatory Visit (HOSPITAL_COMMUNITY): Payer: BC Managed Care – PPO

## 2023-10-17 ENCOUNTER — Ambulatory Visit (HOSPITAL_BASED_OUTPATIENT_CLINIC_OR_DEPARTMENT_OTHER): Payer: BC Managed Care – PPO | Admitting: Certified Registered Nurse Anesthetist

## 2023-10-17 ENCOUNTER — Encounter (HOSPITAL_BASED_OUTPATIENT_CLINIC_OR_DEPARTMENT_OTHER): Payer: Self-pay | Admitting: Surgery

## 2023-10-17 ENCOUNTER — Other Ambulatory Visit: Payer: BC Managed Care – PPO

## 2023-10-17 ENCOUNTER — Ambulatory Visit (HOSPITAL_BASED_OUTPATIENT_CLINIC_OR_DEPARTMENT_OTHER)
Admission: RE | Admit: 2023-10-17 | Discharge: 2023-10-17 | Disposition: A | Discharge status: Home / Self Care | Payer: BC Managed Care – PPO | Attending: Surgery | Admitting: Surgery

## 2023-10-17 DIAGNOSIS — Z8249 Family history of ischemic heart disease and other diseases of the circulatory system: Secondary | ICD-10-CM | POA: Diagnosis not present

## 2023-10-17 DIAGNOSIS — C19 Malignant neoplasm of rectosigmoid junction: Secondary | ICD-10-CM | POA: Insufficient documentation

## 2023-10-17 DIAGNOSIS — I10 Essential (primary) hypertension: Secondary | ICD-10-CM | POA: Diagnosis not present

## 2023-10-17 DIAGNOSIS — Z452 Encounter for adjustment and management of vascular access device: Secondary | ICD-10-CM | POA: Diagnosis not present

## 2023-10-17 DIAGNOSIS — C189 Malignant neoplasm of colon, unspecified: Secondary | ICD-10-CM | POA: Diagnosis not present

## 2023-10-17 DIAGNOSIS — C187 Malignant neoplasm of sigmoid colon: Secondary | ICD-10-CM | POA: Diagnosis not present

## 2023-10-17 DIAGNOSIS — Z5986 Financial insecurity: Secondary | ICD-10-CM | POA: Diagnosis not present

## 2023-10-17 DIAGNOSIS — Z01818 Encounter for other preprocedural examination: Secondary | ICD-10-CM

## 2023-10-17 HISTORY — PX: PORTACATH PLACEMENT: SHX2246

## 2023-10-17 HISTORY — DX: Malignant (primary) neoplasm, unspecified: C80.1

## 2023-10-17 HISTORY — DX: Malignant neoplasm of colon, unspecified: C18.9

## 2023-10-17 SURGERY — INSERTION, TUNNELED CENTRAL VENOUS DEVICE, WITH PORT
Anesthesia: General | Site: Chest | Laterality: Right

## 2023-10-17 MED ORDER — ONDANSETRON HCL 4 MG/2ML IJ SOLN
INTRAMUSCULAR | Status: DC | PRN
  Administered 2023-10-17: 4 mg via INTRAVENOUS

## 2023-10-17 MED ORDER — HEPARIN SOD (PORK) LOCK FLUSH 100 UNIT/ML IV SOLN
INTRAVENOUS | Status: DC | PRN
  Administered 2023-10-17: 500 [IU] via INTRAVENOUS

## 2023-10-17 MED ORDER — GABAPENTIN 300 MG PO CAPS
300.0000 mg | ORAL_CAPSULE | ORAL | Status: AC
  Administered 2023-10-17: 300 mg via ORAL

## 2023-10-17 MED ORDER — MIDAZOLAM HCL 2 MG/2ML IJ SOLN
INTRAMUSCULAR | Status: DC | PRN
  Administered 2023-10-17: 2 mg via INTRAVENOUS

## 2023-10-17 MED ORDER — CHLORHEXIDINE GLUCONATE CLOTH 2 % EX PADS: 6.0000 | MEDICATED_PAD | Freq: Once | CUTANEOUS | Status: DC

## 2023-10-17 MED ORDER — HEPARIN 6000 UNIT IRRIGATION SOLUTION
Freq: Once | Status: AC
  Administered 2023-10-17: 1
  Filled 2023-10-17: qty 500

## 2023-10-17 MED ORDER — CELECOXIB 200 MG PO CAPS
200.0000 mg | ORAL_CAPSULE | ORAL | Status: AC
  Administered 2023-10-17: 200 mg via ORAL

## 2023-10-17 MED ORDER — BUPIVACAINE-EPINEPHRINE 0.25% -1:200000 IJ SOLN
INTRAMUSCULAR | Status: DC | PRN
  Administered 2023-10-17: 20 mL

## 2023-10-17 MED ORDER — LABETALOL HCL 5 MG/ML IV SOLN
INTRAVENOUS | Status: AC
  Filled 2023-10-17: qty 4

## 2023-10-17 MED ORDER — LABETALOL HCL 5 MG/ML IV SOLN: 5.0000 mg | INTRAVENOUS | Status: DC | PRN

## 2023-10-17 MED ORDER — FENTANYL CITRATE (PF) 100 MCG/2ML IJ SOLN
INTRAMUSCULAR | Status: AC
  Filled 2023-10-17: qty 2

## 2023-10-17 MED ORDER — PROPOFOL 10 MG/ML IV BOLUS
INTRAVENOUS | Status: AC
  Filled 2023-10-17: qty 20

## 2023-10-17 MED ORDER — ACETAMINOPHEN 500 MG PO TABS
1000.0000 mg | ORAL_TABLET | ORAL | Status: AC
  Administered 2023-10-17: 1000 mg via ORAL

## 2023-10-17 MED ORDER — GABAPENTIN 300 MG PO CAPS
ORAL_CAPSULE | ORAL | Status: AC
  Filled 2023-10-17: qty 1

## 2023-10-17 MED ORDER — CEFAZOLIN SODIUM-DEXTROSE 2-4 GM/100ML-% IV SOLN
2.0000 g | INTRAVENOUS | Status: AC
  Administered 2023-10-17: 2 g via INTRAVENOUS

## 2023-10-17 MED ORDER — FENTANYL CITRATE (PF) 250 MCG/5ML IJ SOLN
INTRAMUSCULAR | Status: DC | PRN
  Administered 2023-10-17: 50 ug via INTRAVENOUS
  Administered 2023-10-17 (×2): 25 ug via INTRAVENOUS

## 2023-10-17 MED ORDER — LACTATED RINGERS IV SOLN: INTRAVENOUS | Status: DC

## 2023-10-17 MED ORDER — CELECOXIB 200 MG PO CAPS
ORAL_CAPSULE | ORAL | Status: AC
  Filled 2023-10-17: qty 1

## 2023-10-17 MED ORDER — STERILE WATER FOR IRRIGATION IR SOLN
Status: DC | PRN
  Administered 2023-10-17: 500 mL

## 2023-10-17 MED ORDER — LACTATED RINGERS IV SOLN: INTRAVENOUS | Status: DC | PRN

## 2023-10-17 MED ORDER — ACETAMINOPHEN 500 MG PO TABS
ORAL_TABLET | ORAL | Status: AC
  Filled 2023-10-17: qty 2

## 2023-10-17 MED ORDER — CEFAZOLIN SODIUM-DEXTROSE 2-4 GM/100ML-% IV SOLN
INTRAVENOUS | Status: AC
  Filled 2023-10-17: qty 100

## 2023-10-17 MED ORDER — LIDOCAINE 2% (20 MG/ML) 5 ML SYRINGE
INTRAMUSCULAR | Status: DC | PRN
  Administered 2023-10-17: 60 mg via INTRAVENOUS

## 2023-10-17 MED ORDER — DEXAMETHASONE SODIUM PHOSPHATE 10 MG/ML IJ SOLN
INTRAMUSCULAR | Status: DC | PRN
  Administered 2023-10-17: 10 mg via INTRAVENOUS

## 2023-10-17 MED ORDER — PROPOFOL 10 MG/ML IV BOLUS
INTRAVENOUS | Status: DC | PRN
  Administered 2023-10-17: 250 mg via INTRAVENOUS

## 2023-10-17 MED ORDER — MIDAZOLAM HCL 2 MG/2ML IJ SOLN
INTRAMUSCULAR | Status: AC
  Filled 2023-10-17: qty 2

## 2023-10-17 SURGICAL SUPPLY — 42 items
ADH SKN CLS APL DERMABOND .7 (GAUZE/BANDAGES/DRESSINGS)
APL PRP STRL LF DISP 70% ISPRP (MISCELLANEOUS) ×1
BAG DECANTER FOR FLEXI CONT (MISCELLANEOUS) ×1 IMPLANT
BLADE SURG 11 STRL SS (BLADE) ×1 IMPLANT
BLADE SURG 15 STRL LF DISP TIS (BLADE) ×1 IMPLANT
BLADE SURG 15 STRL SS (BLADE) ×1
CHLORAPREP W/TINT 26 (MISCELLANEOUS) ×1 IMPLANT
COVER BACK TABLE 60X90IN (DRAPES) ×1 IMPLANT
COVER MAYO STAND STRL (DRAPES) ×1 IMPLANT
COVER PROBE U/S 5X48 (MISCELLANEOUS) IMPLANT
DERMABOND ADVANCED .7 DNX12 (GAUZE/BANDAGES/DRESSINGS) ×1 IMPLANT
DRAPE C-ARM 42X120 X-RAY (DRAPES) ×1 IMPLANT
DRAPE LAPAROTOMY TRNSV 102X78 (DRAPES) ×1 IMPLANT
DRAPE UTILITY XL STRL (DRAPES) ×1 IMPLANT
DRSG TEGADERM 2-3/8X2-3/4 SM (GAUZE/BANDAGES/DRESSINGS) IMPLANT
DRSG TEGADERM 4X4.75 (GAUZE/BANDAGES/DRESSINGS) IMPLANT
GAUZE 4X4 16PLY ~~LOC~~+RFID DBL (SPONGE) ×1 IMPLANT
GAUZE SPONGE 4X4 12PLY STRL (GAUZE/BANDAGES/DRESSINGS) IMPLANT
GLOVE BIO SURGEON STRL SZ 6 (GLOVE) ×1 IMPLANT
GLOVE BIO SURGEON STRL SZ7.5 (GLOVE) ×1 IMPLANT
GLOVE BIOGEL PI IND STRL 6.5 (GLOVE) IMPLANT
GLOVE BIOGEL PI IND STRL 7.0 (GLOVE) IMPLANT
GLOVE BIOGEL PI IND STRL 8 (GLOVE) ×1 IMPLANT
GLOVE SURG SS PI 6.5 STRL IVOR (GLOVE) IMPLANT
GOWN STRL REUS W/ TWL LRG LVL3 (GOWN DISPOSABLE) IMPLANT
GOWN STRL REUS W/TWL 2XL LVL3 (GOWN DISPOSABLE) ×1 IMPLANT
GOWN STRL REUS W/TWL LRG LVL3 (GOWN DISPOSABLE) ×1
KIT PORT POWER 8FR ISP CVUE (Port) IMPLANT
KIT TURNOVER CYSTO (KITS) ×1 IMPLANT
NDL HYPO 25X1 1.5 SAFETY (NEEDLE) ×1 IMPLANT
NEEDLE HYPO 25X1 1.5 SAFETY (NEEDLE) ×1
PACK BASIN DAY SURGERY FS (CUSTOM PROCEDURE TRAY) ×1 IMPLANT
SLEEVE SCD COMPRESS KNEE MED (STOCKING) ×2 IMPLANT
SUT MNCRL AB 4-0 PS2 18 (SUTURE) ×1 IMPLANT
SUT PROLENE 2 0 SH DA (SUTURE) ×2 IMPLANT
SUT VIC AB 3-0 SH 27 (SUTURE) ×1
SUT VIC AB 3-0 SH 27X BRD (SUTURE) ×1 IMPLANT
SYR 10ML LL (SYRINGE) ×1 IMPLANT
SYR 20ML LL LF (SYRINGE) IMPLANT
SYR 5ML LUER SLIP (SYRINGE) ×1 IMPLANT
SYR CONTROL 10ML LL (SYRINGE) ×1 IMPLANT
TOWEL OR 17X24 6PK STRL BLUE (TOWEL DISPOSABLE) ×1 IMPLANT

## 2023-10-17 NOTE — Transfer of Care (Signed)
Immediate Anesthesia Transfer of Care Note  Patient: Cory Gomez  Procedure(s) Performed: INSERTION PORT-A-CATH WITH ULRASOUND GUIDANCE (Right: Chest)  Patient Location: PACU  Anesthesia Type:General  Level of Consciousness: drowsy and patient cooperative  Airway & Oxygen Therapy: Patient Spontanous Breathing and Patient connected to nasal cannula oxygen  Post-op Assessment: Report given to RN and Post -op Vital signs reviewed and stable  Post vital signs: Reviewed and stable  Last Vitals:  Vitals Value Taken Time  BP    Temp    Pulse    Resp    SpO2      Last Pain:  Vitals:   10/17/23 0800  TempSrc: Oral  PainSc: 0-No pain      Patients Stated Pain Goal: 6 (10/17/23 0800)  Complications: No notable events documented.

## 2023-10-17 NOTE — Discharge Instructions (Addendum)
PORT-A-CATHETER PLACEMENT:  INSTRUCTIONS AFTER SURGERY  The Chemotherapy / Oncology nurse will remove your waterproof bandages in their office a few days after surgery.  Do not remove the bandages unless you are at least 5 days out from surgery or as otherwise instructed  DIET: Follow a light bland diet the first night after arrival home, such as soup, liquids, crackers, etc.  Be sure to include lots of fluids daily.  Avoid fast food or heavy meals as your are more likely to get nauseated.    Take your usually prescribed home medications unless otherwise directed.  PAIN CONTROL: Pain is best controlled by a usual combination of three different methods TOGETHER: Ice/Heat Over the counter acetaminophen pain medication Prescription pain medication  Most patients will experience some swelling and bruising around the incisions.  Ice packs or heating pads (30-60 minutes up to 6 times a day) will help. Use ice for the first few days to help decrease swelling and bruising, then switch to heat to help relax tight/sore spots and speed recovery.  Some people prefer to use ice alone, heat alone, alternating between ice & heat.  Experiment to what works for you.  Swelling and bruising can take several weeks to resolve.    It is helpful to take an over-the-counter pain medication regularly for the first few weeks.  Using acetaminophen (Tylenol, etc) 500-650mg  four times a day (every meal & bedtime) is usually safest since NSAIDs like ibuprofen are not advisable in patients with kidney disease.  A  prescription for pain medication (such as oxycodone, hydrocodone, etc) may be given to you upon discharge.  Take your pain medication as prescribed.   If you are having problems/concerns with the prescription medicine (does not control pain, nausea, vomiting, rash, itching, etc), please call us [phone] to see if we need to switch you to a different pain medicine that will work better for you and/or  control your side effect better. If you need a refill on your pain medication, please contact your pharmacy.  They will contact our office to request authorization. Prescriptions will not be filled after 5 pm or on week-ends.  Avoid getting constipated.   Between the surgery and the pain medications, it is common to experience some constipation.  Increasing fluid intake and taking a fiber supplement (such as Metamucil, Citrucel, FiberCon, MiraLax, etc) 1-2 times a day regularly will usually help prevent this problem from occurring.  A mild laxative (prune juice, Milk of Magnesia, MiraLax, etc) should be taken according to package directions if there are no bowel movements after 48 hours.    Wash / shower every day.   You may shower over the dressings as they are waterproof.  Continue to shower over incision(s) after the dressing is off.  The Chemotherapy / Oncology nurse will remove your waterproof bandages in their office a few days after surgery.  Do not remove the bandages unless you are 5 days out from surgery  ACTIVITIES as tolerated:   You may resume regular (light) daily activities beginning the next day--such as daily self-care, walking, climbing stairs--gradually increasing activities as tolerated.  If you can walk 30 minutes without difficulty, it is safe to try more intense activity such as jogging, treadmill, bicycling, low-impact aerobics, swimming, etc. Save the most intensive and strenuous activity for last such as push-ups, heavy lifting, contact sports, etc  Refrain from any heavy lifting or straining until you are off narcotics for pain control.    DO NOT  PUSH THROUGH PAIN.  Let pain be your guide: If it hurts to do something, don't do it.  Pain is your body warning you to avoid that activity for another week until the pain goes down. You may drive when you are no longer taking prescription pain medication, you can comfortably wear a seatbelt, and you can safely maneuver your car  and apply brakes. You may have sexual intercourse when it is comfortable.   FOLLOW UP with the Oncology / Chemotherapy nurses closely after surgery. Please call [facility] at [phone] only as needed.  The infusion nurses & Oncology usually follow you closely, making the need for follow-up in our office redundant and therefore not needed.  If they or you have concerns, please call us for possible follow-up in our office 9. IF YOU HAVE DISABILITY OR FAMILY LEAVE FORMS, BRING THEM TO THE OFFICE FOR PROCESSING.  DO NOT GIVE THEM TO YOUR DOCTOR.   WHEN TO CALL us [phone]: Poor pain control Reactions / problems with new medications (rash/itching, nausea, etc)  Fever over 101.5 F (38.5 C) Worsening swelling or bruising Continued bleeding from incision. Increased pain, redness, or drainage from the incision   The clinic staff is available to answer your questions during regular business hours (8:30am-5pm).  Please don't hesitate to call and ask to speak to one of our nurses for clinical concerns.   If you have a medical emergency, go to the nearest emergency room or call 911.  A surgeon from [facility] is always on call at the hospitals       Ardeth Sportsman, MD, FACS, MASCRS Esophageal, Gastrointestinal & Colorectal Surgery Robotic and Minimally Invasive Surgery  [facility] [address] [place], [place] [address] [phone] Fax [phone] Main          Post Anesthesia Home Care Instructions  Activity: Get plenty of rest for the remainder of the day. A responsible individual must stay with you for 24 hours following the procedure.  For the next 24 hours, DO NOT: -Drive a car -Advertising copywriter -Drink alcoholic beverages -Take any medication unless instructed by your physician -Make any legal decisions or sign important papers.  Meals: Start with liquid foods such as  gelatin or soup. Progress to regular foods as tolerated. Avoid greasy, spicy, heavy foods. If nausea and/or vomiting occur, drink only clear liquids until the nausea and/or vomiting subsides. Call your physician if vomiting continues.  Special Instructions/Symptoms: Your throat may feel dry or sore from the anesthesia or the breathing tube placed in your throat during surgery. If this causes discomfort, gargle with warm salt water. The discomfort should disappear within 24 hours.  If needed, do not take any nonsteroidal anti inflammatories until after 2:10 pm today.

## 2023-10-17 NOTE — Op Note (Signed)
10/17/2023  1:55 PM  PATIENT:  Cory Gomez  42 y.o. male  Patient Care Team: [name], MD as PCP - General (Family Medicine) Karie Soda, MD as Consulting Physician (General Surgery) Hilarie Fredrickson, MD as Consulting Physician (Gastroenterology) Malachy Mood, MD as Consulting Physician (Medical Oncology) Marjory Lies, Glenford Bayley, MD as Consulting Physician (Neurology) Skotnicki, Skeet Latch, DO as Consulting Physician (Otolaryngology)  PRE-OPERATIVE DIAGNOSIS:  COLON CANCER  NEED FOR CHEMOTHERAPY  POST-OPERATIVE DIAGNOSIS:  COLON CANCER NEED FOR CHEMOTHERAPY  PROCEDURE:  Procedure(s): INSERTION PORT-A-CATH WITH Gaspar Skeeters GUIDANCE  SURGEON:  Ardeth Sportsman, MD  ASSISTANT: none   ANESTHESIA:   local and general  EBL:  Total I/O In: -  Out: 5 [Blood:5]  Delay start of Pharmacological VTE agent (>24hrs) due to surgical blood loss or risk of bleeding:  no  DRAINS: none   SPECIMEN:  No Specimen  DISPOSITION OF SPECIMEN:  N/A  COUNTS:  YES  PLAN OF CARE: Discharge to home after PACU  PATIENT DISPOSITION:  PACU - hemodynamically stable.  INDICATION: Colorectal cancer patient with need for IV chemotherapy.  Port-A-Cath placement was requested.  Use of a central venous catheter for intravenous therapy was discussed.  Technique of catheter placement using ultrasound and fluoroscopy guidance was discussed.  Risks such as bleeding, infection, pneumothorax, catheter occlusion, reoperation, and other risks were discussed.   I noted a good likelihood this will help address the problem.  Questions were answered.  The patient expressed understanding & wishes to proceed.  OR FINDINGS: Normal-appearing anatomy.  Is an 8 [place] power port. It goes through the right internal jugular vein  DESCRIPTION:   Procedure: Informed consent was confirmed. Patient was brought the operating room. and positioned supine. Arms were tucked. The patient underwent deep sedation. Neck and chest  were clipped and prepped and draped in a sterile fashion. A surgical timeout confirmed our plan.  I placed a field block of local anesthesia on the neck & chest  I used the ultrasound to identify the right internal jugular vein. I entered into it using on the first venipuncture under direct ultrasound guidance. Wire was passed into the inferior vena cava by fluoroscopy.  I made an incision in the lateral infraclavicular pocket. Made a subcutaneous pocket. I tunneled the power port from the chest wound to the neck puncture site. The port was secured to the left anterior chest wall using 2-0 Prolene interrupted stitches x4. Catheter flushed well.  I used a dilator on the wire using Seldinger technique to dilate the neck tract. Then I used a dilator with a peel away sheath.  We used fluoroscopy.  I pulled the wire back into the right atrial/superior vena cava junction.  I pulled the wire back until it was at the neck puncture site. This gave the true measurement of the intravenous segment. I cut the catheter to appropriate length. I removed the wire and dilator. The catheter was placed within the sheath. The sheath was peeled away.  Fluoroscopy confirmed the tip in the distal SVC/RA junction.   Catheter aspirated a little sluggishly but flushed well.  Did some dissection around the neck to unkinking of there and have a more natural curve.  Did refluxing aspiration and flushed and aspirated better.  On final fluoro reevaluation the tip seen to be in good position in the distal SVC.    I closed the wounds using 4 Monocryl stitch & placed sterile dressings. Patient should go home later today. Catheter is okay  to use.  I discussed operative findings, updated the patient's status, discussed probable steps to recovery, and gave postoperative recommendations to the patient's family.  Recommendations were made.  Questions were answered.  They expressed understanding & appreciation.   Ardeth Sportsman, M.D.,  F.A.C.S. Gastrointestinal and Minimally Invasive Surgery [facility] [address] [place], [place] [address] [phone] Main / Paging

## 2023-10-17 NOTE — Anesthesia Procedure Notes (Signed)
Procedure Name: LMA Insertion Date/Time: 10/17/2023 12:37 PM  Performed by: Dairl Ponder, CRNAPre-anesthesia Checklist: Patient identified, Emergency Drugs available, Suction available and Patient being monitored Patient Re-evaluated:Patient Re-evaluated prior to induction Oxygen Delivery Method: Circle System Utilized Preoxygenation: Pre-oxygenation with 100% oxygen Induction Type: IV induction Ventilation: Mask ventilation without difficulty LMA: LMA inserted LMA Size: 4.0 Number of attempts: 1 Airway Equipment and Method: Bite block Placement Confirmation: positive ETCO2 Tube secured with: Tape Dental Injury: Teeth and Oropharynx as per pre-operative assessment

## 2023-10-17 NOTE — Anesthesia Preprocedure Evaluation (Signed)
Anesthesia Evaluation  Patient identified by MRN, date of birth, ID band Patient awake    Reviewed: Allergy & Precautions, NPO status , Patient's Chart, lab work & pertinent test results  Airway Mallampati: III  TM Distance: >3 FB Neck ROM: Full    Dental  (+) Dental Advisory Given   Pulmonary neg pulmonary ROS   breath sounds clear to auscultation       Cardiovascular hypertension,  Rhythm:Regular Rate:Normal     Neuro/Psych negative neurological ROS     GI/Hepatic Neg liver ROS,,,Colon CA   Endo/Other  negative endocrine ROS    Renal/GU negative Renal ROS     Musculoskeletal   Abdominal   Peds  Hematology negative hematology ROS (+)   Anesthesia Other Findings   Reproductive/Obstetrics                             Anesthesia Physical Anesthesia Plan  ASA: 2  Anesthesia Plan: General   Post-op Pain Management: Tylenol PO (pre-op)*   Induction: Intravenous  PONV Risk Score and Plan: 2 and Dexamethasone, Ondansetron and Treatment may vary due to age or medical condition  Airway Management Planned: LMA  Additional Equipment: None  Intra-op Plan:   Post-operative Plan: Extubation in OR  Informed Consent: I have reviewed the patients History and Physical, chart, labs and discussed the procedure including the risks, benefits and alternatives for the proposed anesthesia with the patient or authorized representative who has indicated his/her understanding and acceptance.       Plan Discussed with: CRNA  Anesthesia Plan Comments:        Anesthesia Quick Evaluation

## 2023-10-17 NOTE — Interval H&P Note (Signed)
History and Physical Interval Note:  10/17/2023 8:19 AM  Cory Gomez  has presented today for surgery, with the diagnosis of COLON CANCER  NEED FOR CHEMOTHERAPY.  The various methods of treatment have been discussed with the patient and family. After consideration of risks, benefits and other options for treatment, the patient has consented to  Procedure(s): INSERTION PORT-A-CATH WITH ULRASOUND GUIDANCE (N/A) as a surgical intervention.  The patient's history has been reviewed, patient examined, no change in status, stable for surgery.  I have reviewed the patient's chart and labs.  Questions were answered to the patient's satisfaction.    I have re-reviewed the the patient's records, history, medications, and allergies.  I have re-examined the patient.  I again discussed intraoperative plans and goals of post-operative recovery.  The patient agrees to proceed.  Cory Gomez  09-Jun-1981 [id number]  Patient Care Team: [name], MD as PCP - General (Family Medicine) Karie Soda, MD as Consulting Physician (General Surgery)  Patient Active Problem List   Diagnosis Date Noted   Cancer of sigmoid colon - pT4a, pN1a - s/p roboric LAR resection 09/27/2023 09/30/2023    Priority: High   Sigmoid diverticulitis 09/25/2023   Left lower quadrant abdominal pain 08/03/2023   Abscess of sigmoid colon 07/10/2023   Obesity, Class II, BMI 35-39.9 07/08/2023   Perforated sigmoid colon (HCC) 07/06/2023   Mood disorder (HCC) 06/08/2023   Annual physical exam 06/08/2023   Bilateral impacted cerumen 12/17/2022   Hypertension 01/11/2022   Sinusitis 12/30/2021   Morbid obesity with BMI of 40.0-44.9, adult (HCC) 07/17/2021   Hyperlipidemia 01/13/2020   Acne 09/09/2019   GAD (generalized anxiety disorder) 08/17/2019   ADD (attention deficit disorder) 07/24/2019   Binge eating disorder 07/24/2019   Vitamin D deficiency 06/11/2019   Prediabetes    Major depressive disorder, recurrent  episode, in partial remission (HCC)     Past Medical History:  Diagnosis Date   ADHD (attention deficit hyperactivity disorder)    Annual physical exam 09/09/2019   Anxiety and depression    Cancer (HCC)    Colon cancer (HCC)    COVID-19    12/28/19   Depression, recurrent (HCC) 09/09/2019   Headache 12/31/2021   History of kidney stones    Hypertriglyceridemia 07/17/2021   Insomnia    Inverted nipple 09/16/2019   Kidney stone    Mastoiditis of both sides    Nystagmus 01/22/2022   Obesity (BMI 30-39.9)    Obesity (BMI 30-39.9)    Suicidal ideation 07/14/2021   Vertigo     Past Surgical History:  Procedure Laterality Date   COLON RESECTION  09/25/2023   PROCTOSCOPY N/A 09/25/2023   Procedure: RIGID PROCTOSCOPY;  Surgeon: Karie Soda, MD;  Location: [facility];  Service: General;  Laterality: N/A;    Social History   Socioeconomic History   Marital status: Married    Spouse name: Not on file   Number of children: Not on file   Years of education: Not on file   Highest education level: Not on file  Occupational History   Not on file  Tobacco Use   Smoking status: Never   Smokeless tobacco: Never  Vaping Use   Vaping status: Never Used  Substance and Sexual Activity   Alcohol use: Never   Drug use: Never   Sexual activity: Yes    Partners: Female  Other Topics Concern   Not on file  Social History Narrative   Married no kids wife is  Cory Gomez    -wife and [name] on [place]      Has siblings    Works Educational psychologist IT [employer]   1 dog    Social Determinants of Health   Financial Resource Strain: High Risk (10/11/2023)   Overall Financial Resource Strain (CARDIA)    Difficulty of Paying Living Expenses: Very hard  Food Insecurity: Food Insecurity Present (09/30/2023)   Hunger Vital Sign    Worried About Running Out of Food in the Last Year: Sometimes true    Ran Out of Food in the Last Year: Sometimes true  Transportation Needs: No  Transportation Needs (09/30/2023)   PRAPARE - Administrator, Civil Service (Medical): No    Lack of Transportation (Non-Medical): No  Physical Activity: Not on file  Stress: Not on file  Social Connections: Not on file  Intimate Partner Violence: Not At Risk (09/26/2023)   Humiliation, Afraid, Rape, and Kick questionnaire    Fear of Current or Ex-Partner: No    Emotionally Abused: No    Physically Abused: No    Sexually Abused: No    Family History  Problem Relation Age of Onset   Diabetes Mother    Hypertension Mother    Hyperlipidemia Mother    Diabetes Mellitus II Mother    Anxiety disorder Mother    Depression Mother    Stroke Father    Alcoholism Father    Anxiety disorder Brother    Depression Brother    Hypertension Brother    Hyperlipidemia Brother    Diabetes type II Brother    ADD / ADHD Brother    Dementia Maternal Grandfather    Stomach cancer Neg Hx    Colon cancer Neg Hx    Esophageal cancer Neg Hx    Pancreatic cancer Neg Hx     Medications Prior to Admission  Medication Sig Dispense Refill Last Dose   Acetaminophen (TYLENOL PO) Take 500 mg by mouth every 6 (six) hours.   Past Week   buPROPion (WELLBUTRIN XL) 150 MG 24 hr tablet Take 1 tablet (150 mg total) by mouth every morning. 30 tablet 2 10/16/2023   cyclobenzaprine (FLEXERIL) 10 MG tablet Take 1 tablet (10 mg total) by mouth 3 (three) times daily as needed for muscle spasms. 15 tablet 1 Past Week   DULoxetine (CYMBALTA) 30 MG capsule Take 3 capsules (90 mg total) by mouth daily. 90 capsule 2 10/16/2023   eszopiclone (LUNESTA) 2 MG TABS tablet Take 1 tablet (2 mg total) by mouth at bedtime as needed for sleep. Take immediately before bedtime 30 tablet 2 Past Week   oxyCODONE (OXY IR/ROXICODONE) 5 MG immediate release tablet Take 1 tablet (5 mg total) by mouth every 6 (six) hours as needed for moderate pain, severe pain or breakthrough pain. 20 tablet 0 Past Week   lidocaine-prilocaine (EMLA)  cream Apply to affected area once 30 g 3    ondansetron (ZOFRAN-ODT) 8 MG disintegrating tablet Take 1 tablet (8 mg total) by mouth every 8 (eight) hours as needed for nausea or vomiting. (Patient taking differently: Take 8 mg by mouth every 8 (eight) hours as needed for nausea or vomiting. Have not started) 30 tablet 1    prochlorperazine (COMPAZINE) 10 MG tablet Take 1 tablet (10 mg total) by mouth every 6 (six) hours as needed for nausea or vomiting. (Patient taking differently: Take 10 mg by mouth every 6 (six) hours as needed for nausea or vomiting. Have  not started) 30 tablet 1     Current Facility-Administered Medications  Medication Dose Route Frequency Provider Last Rate Last Admin   ceFAZolin (ANCEF) IVPB 2g/100 mL premix  2 g Intravenous On Call to OR Karie Soda, MD       Chlorhexidine Gluconate Cloth 2 % PADS 6 each  6 each Topical Once Karie Soda, MD       And   Chlorhexidine Gluconate Cloth 2 % PADS 6 each  6 each Topical Once Karie Soda, MD       lactated ringers infusion   Intravenous Continuous Lewie Loron, MD         No Known Allergies  BP 125/84   Pulse 97   Temp 98.2 F (36.8 C) (Oral)   Resp 18   Ht 6\' 2"  (1.88 m)   Wt 124 kg   SpO2 100%   BMI 35.09 kg/m   Labs: No results found for this or any previous visit (from the past 48 hour(s)).  Imaging / Studies: CT CHEST W CONTRAST  Result Date: 10/08/2023 CLINICAL DATA:  Newly diagnosed colon carcinoma. Staging. * Tracking Code: BO * EXAM: CT CHEST WITH CONTRAST TECHNIQUE: Multidetector CT imaging of the chest was performed during intravenous contrast administration. RADIATION DOSE REDUCTION: This exam was performed according to the departmental dose-optimization program which includes automated exposure control, adjustment of the mA and/or kV according to patient size and/or use of iterative reconstruction technique. CONTRAST:  75mL ISOVUE-300 IOPAMIDOL (ISOVUE-300) INJECTION 61% COMPARISON:  None  Available. FINDINGS: Cardiovascular:  No acute findings. Mediastinum/Nodes: No masses or pathologically enlarged lymph nodes identified. Lungs/Pleura: No suspicious nodules or masses identified. No evidence of pulmonary infiltrate or pleural effusion. Upper Abdomen:  Unremarkable. Musculoskeletal:  No suspicious bone lesions. IMPRESSION: Negative. No evidence of thoracic metastatic disease or other significant abnormality. Electronically Signed   By: Danae Orleans M.D.   On: 10/08/2023 08:04     .Ardeth Sportsman, M.D., F.A.C.S. Gastrointestinal and Minimally Invasive Surgery [facility] [address] [place], [place] [address] [phone] Main / Paging  10/17/2023 8:19 AM    Ardeth Sportsman

## 2023-10-17 NOTE — H&P (Signed)
10/17/2023     PROVIDER: Jarrett Soho, MD  Patient Care Team: Cory Hatter, MD as PCP - General (Family Medicine) Gomez, Sharl Ma, MD as Consulting Provider (General Surgery)  [name] DOB: 1981/09/15 DATE OF ENCOUNTER: 10/14/2023  Interval History:   The patient returns to the office after undergoing robotic low anterior resection on 09/25/2023  Pathology: pT4pN1a (1/45 LN). There is preserved expression of the major MMR proteins. There is a very low probability that microsatellite instability (MSI) is present.  Patient returns. We discussed the GI tumor board. Medical oncology and felt to benefit from our standard post adjuvant FOLFOX therapy. Due to get his Port-A-Cath later this week on Thursday by me. He has noted some occasional serous drainage from the incision at his belt line but it has been mild. He is back to most regular activities. He is eating most food but has been hesitant to do anything unrestricted. He is drinking some extra life liquid fiber/protein supplement. Energy level coming back.    Labs, Imaging and Diagnostic Testing:  Located in 'Care Everywhere' section of Epic EMR chart  CEA 1.1  CT scan of chest, pelvis without any evidence of any metastatic disease. Clinically with abscess in July suspicious for perforation.  PRIOR [facility] CLINIC NOTES:  Located in 'Care Everywhere' section of Epic EMR chart  SURGERY NOTES:  Located in 'Care Everywhere' section of Epic EMR chart  POST-OPERATIVE DIAGNOSIS: SIGMOID DIVERTICULITIS WITH PARTIAL COLON OBSTRUCTION  PROCEDURE:  -ROBOTIC LOW ANTERIOR RECTOSIGMOID RESECTION -TRANSVERSUS ABDOMINIS PLANE (TAP) BLOCK - BILATERAL -RIGID PROCTOSCOPY -INTRAOPERATIVE ASSESSMENT OF TISSUE VASCULAR PERFUSION USING ICG (indocyanine green) IMMUNOFLUORESCENCE  SURGEON: Cory Sportsman, MD  OR FINDINGS:   Patient had very thickened sigmoid colon with corkscrew like twisting and dense  adhesions to dome of bladder. Colon moderately dilated concerning for at least partial or near colon obstruction which correlates with difficulty of attempt at colonoscopy and bowel prep yesterday. Bowel prep still fair.  No obvious metastatic disease on visceral parietal peritoneum or liver.  It is a 31mm EEA anastomosis ( distal descending colon connected to proximal rectum.) It rests 14 cm from the anal verge by rigid proctoscopy.  PATHOLOGY:  Located in 'Care Everywhere' section of Epic EMR chart  FINAL MICROSCOPIC DIAGNOSIS:   A. COLON, RESECTION:  - Invasive moderately differentiated adenocarcinoma, 7.8 cm involving  sigmoid colon  - Carcinoma invades into the serosal surface  - Resection margins are negative for carcinoma  - Negative for lymphovascular or perineural invasion  - Metastatic carcinoma to one of forty-five lymph nodes (1/45)  - See oncology table   Regional Lymph Nodes:  Number of Lymph Nodes with Tumor: 1  Number of Lymph Nodes Examined: 45  Tumor Deposits: Not identified  Distant Metastasis:  Distant Site(s) Involved: Not applicable  Pathologic Stage Classification (pTNM, AJCC 8th Edition): pT4a, pN1a   B. COLON, DISTAL MARGIN, EXCISION:  - Colonic donut within normal limits, negative for carcinoma   ADDENDUM:  Mismatch Repair Protein (IHC)  SUMMARY INTERPRETATION: NORMAL  There is preserved expression of the major MMR proteins. There is a very low probability that microsatellite instability (MSI) is present. However, certain clinically significant MMR protein mutations may result in preservation of nuclear expression. It is recommended that the preservation of protein expression be correlated with molecular based MSI testing.  IHC EXPRESSION RESULTS  TEST RESULT MLH1: Preserved nuclear expression MSH2: Preserved nuclear expression MSH6: Preserved nuclear expression PMS2: Preserved nuclear expression  References: 1. Guidelines  on Genetic  Evaluation and Management of Lynch Syndrome: A Consensus Statement by the [place] Multi-Society Task Force on Colorectal Cancer Stana Bunting. Ileana Roup , MD, and other . Am Katheren Puller 2014; [phone]; doi: 10.1038/ajg.2014.186; published online 14 July 2013 2. Outcomes of screening endometrial cancer patients for Lynch syndrome by patient-administered checklist. Garner Nash MS, and others. Gynecol Oncol 2013;131(3):619-623. 3. Muir-Torre syndrome (MTS): An update and approach to diagnosis and management. Kym Groom, MD and others. J Am Acad Dermatol [phone]   Physical Examination:   Body mass index is 35.92 kg/m.  Constitutional: Not cachectic. Hygeine adequate.  Eyes: Normal extraocular movements. Sclera nonicteric Neuro: No major focal sensory defects. No major motor deficits. Psych: No severe agitation. No severe anxiety. Judgment & insight Adequate, Oriented x4, HENT: Normocephalic, Mucus membranes moist. No thrush.  Neck: Supple, No tracheal deviation.  Chest: Good respiratory excursion. No audible wheezing CV: No major extremity edema Ext: No obvious deformity or contracture. Edema: not present. No cyanosis Skin: Warm and dry Musculoskeletal: Mobility: no assist device moving easily without restrictions  Abdomen: Incisions Clean & dry with normal healing ridge Nontender. Soft. Nondistended.   Gen: Inguinal hernia: Not present. Inguinal lymph nodes: without lymphadenopathy.   Rectal: (Deferred)    Assessment and Plan:   Cory Gomez is a 42 y.o. male recovering s/p robotic low anterior resection for what was presumed to be sigmoid diverticulitis with prior abscess and actually is stage III cancer pT4 pN1..  Diagnoses and all orders for this visit:  Malignant neoplasm of sigmoid colon (CMS/HHS-HCC)  Family history of diverticulitis of colon    Recovering relatively well status post resection of cancer.  Solid diet as tolerated. Consider daily fiber  supplement and multivitamin.  Would he would benefit from colonoscopy in about a year and then survival pathway per gastroenterology depending on treatment findings and genetics. Usually every 3-5 years.  Would benefit from post adjuvant chemotherapy discussed at multidisciplinary GI tumor board and Dr. Mosetta Putt with medical oncology. FOLFOX pretty standard. Plan to do Port-A-Cath later this week by me   Use of a central venous catheter for intravenous therapy was discussed.  Technique of catheter placement using ultrasound and fluoroscopy guidance was discussed.  Risks such as bleeding, infection, injury to other organs, need for repair of tissues / organs, need for further treatment, pneumothorax, catheter occlusion, reoperation, and other risks were discussed.   I noted a good likelihood this will help address the problem.  Questions were answered.  The patient expressed understanding & wishes to proceed.    Cory Sportsman, MD, FACS, MASCRS Esophageal, Gastrointestinal & Colorectal Surgery Robotic and Minimally Invasive Surgery  [facility] [address], [address] [place], [place] [address] [phone] Fax [phone] Main  CONTACT INFORMATION: Weekday (9AM-5PM): Call [facility] main office at [phone] Weeknight (5PM-9AM) or Weekend/Holiday: Check EPIC "Web Links" tab & use "AMION" (password " [email or web address]") for General Surgery [facility] coverage  Please, DO NOT use SecureChat  (it is not reliable communication to reach operating surgeons & will lead to a delay in care).   Epic staff messaging available for outptient concerns needing 1-2 business day response.     10/17/2023

## 2023-10-18 ENCOUNTER — Telehealth: Payer: Self-pay | Admitting: Hematology

## 2023-10-18 NOTE — Telephone Encounter (Signed)
Patient is aware of rescheduled appointment times for 11/22/2023 appointment

## 2023-10-21 ENCOUNTER — Inpatient Hospital Stay: Payer: BC Managed Care – PPO

## 2023-10-21 ENCOUNTER — Encounter: Payer: Self-pay | Admitting: Hematology

## 2023-10-21 ENCOUNTER — Encounter (HOSPITAL_BASED_OUTPATIENT_CLINIC_OR_DEPARTMENT_OTHER): Payer: Self-pay | Admitting: Surgery

## 2023-10-21 DIAGNOSIS — C187 Malignant neoplasm of sigmoid colon: Secondary | ICD-10-CM

## 2023-10-21 NOTE — Progress Notes (Signed)
Patient did not bring documents for [employer]. He may bring at next visit.

## 2023-10-21 NOTE — Progress Notes (Signed)
Called patient regarding referral for [employer].  Introduced myself as Dance movement psychotherapist and to offer available resources. Discussed one-time $1000 Marketing executive to assist with personal expenses while going through treatment. Advised what is needed to apply. He will bring at his appointment today to be scanned and emailed to me. He will be given grant paperwork to complete and receive a copy as well as green folder with expense sheet inside to review. Advised to contact me at earliest convenience to review.  He will have my card to do so and for any additional financial questions or concerns.

## 2023-10-21 NOTE — Anesthesia Postprocedure Evaluation (Signed)
Anesthesia Post Note  Patient: Cory Gomez  Procedure(s) Performed: INSERTION PORT-A-CATH WITH ULRASOUND GUIDANCE (Right: Chest)     Patient location during evaluation: PACU Anesthesia Type: General Level of consciousness: awake and alert Pain management: pain level controlled Vital Signs Assessment: post-procedure vital signs reviewed and stable Respiratory status: spontaneous breathing, nonlabored ventilation, respiratory function stable and patient connected to nasal cannula oxygen Cardiovascular status: blood pressure returned to baseline and stable Postop Assessment: no apparent nausea or vomiting Anesthetic complications: no   No notable events documented.  Last Vitals:  Vitals:   10/17/23 1615 10/17/23 1630  BP: (!) 168/113 (!) 175/109  Pulse: 90 89  Resp: 17 16  Temp:    SpO2: 97% 97%    Last Pain:  Vitals:   10/18/23 1010  TempSrc:   PainSc: 2                  Kennieth Rad

## 2023-10-22 NOTE — Assessment & Plan Note (Signed)
-  pT4aN1M0 stage IIIB, MMS, G2 -Presented with perforated diverticulitis in sigmoid colon in July, 2024. -Patient underwent laparoscopic resection of sigmoid colon, path reviewed moderate differentiated adenocarcinoma with 1 positive lymph nodes. -I recommend adjuvant chemotherapy FOLFOX for 6 months if he can tolerate.

## 2023-10-23 ENCOUNTER — Other Ambulatory Visit: Payer: Self-pay

## 2023-10-23 ENCOUNTER — Inpatient Hospital Stay: Payer: BC Managed Care – PPO

## 2023-10-23 ENCOUNTER — Inpatient Hospital Stay (HOSPITAL_BASED_OUTPATIENT_CLINIC_OR_DEPARTMENT_OTHER): Payer: BC Managed Care – PPO | Admitting: Hematology

## 2023-10-23 VITALS — BP 150/95 | HR 94 | Temp 98.6°F | Resp 18 | Ht 74.0 in | Wt 276.3 lb

## 2023-10-23 VITALS — BP 155/91 | HR 93 | Temp 97.6°F | Resp 18

## 2023-10-23 DIAGNOSIS — Z95828 Presence of other vascular implants and grafts: Secondary | ICD-10-CM | POA: Insufficient documentation

## 2023-10-23 DIAGNOSIS — G44209 Tension-type headache, unspecified, not intractable: Secondary | ICD-10-CM

## 2023-10-23 DIAGNOSIS — D500 Iron deficiency anemia secondary to blood loss (chronic): Secondary | ICD-10-CM | POA: Diagnosis not present

## 2023-10-23 DIAGNOSIS — K5720 Diverticulitis of large intestine with perforation and abscess without bleeding: Secondary | ICD-10-CM | POA: Diagnosis not present

## 2023-10-23 DIAGNOSIS — C187 Malignant neoplasm of sigmoid colon: Secondary | ICD-10-CM

## 2023-10-23 DIAGNOSIS — Z5111 Encounter for antineoplastic chemotherapy: Secondary | ICD-10-CM | POA: Diagnosis not present

## 2023-10-23 LAB — CBC WITH DIFFERENTIAL (CANCER CENTER ONLY)
Abs Immature Granulocytes: 0.01 10*3/uL (ref 0.00–0.07)
Basophils Absolute: 0 10*3/uL (ref 0.0–0.1)
Basophils Relative: 1 %
Eosinophils Absolute: 0.1 10*3/uL (ref 0.0–0.5)
Eosinophils Relative: 2 %
HCT: 34.8 % — ABNORMAL LOW (ref 39.0–52.0)
Hemoglobin: 11 g/dL — ABNORMAL LOW (ref 13.0–17.0)
Immature Granulocytes: 0 %
Lymphocytes Relative: 22 %
Lymphs Abs: 1.3 10*3/uL (ref 0.7–4.0)
MCH: 24.5 pg — ABNORMAL LOW (ref 26.0–34.0)
MCHC: 31.6 g/dL (ref 30.0–36.0)
MCV: 77.5 fL — ABNORMAL LOW (ref 80.0–100.0)
Monocytes Absolute: 0.4 10*3/uL (ref 0.1–1.0)
Monocytes Relative: 6 %
Neutro Abs: 4.1 10*3/uL (ref 1.7–7.7)
Neutrophils Relative %: 69 %
Platelet Count: 305 10*3/uL (ref 150–400)
RBC: 4.49 MIL/uL (ref 4.22–5.81)
RDW: 14.6 % (ref 11.5–15.5)
WBC Count: 6 10*3/uL (ref 4.0–10.5)
nRBC: 0 % (ref 0.0–0.2)

## 2023-10-23 LAB — CMP (CANCER CENTER ONLY)
ALT: 13 U/L (ref 0–44)
AST: 12 U/L — ABNORMAL LOW (ref 15–41)
Albumin: 4.4 g/dL (ref 3.5–5.0)
Alkaline Phosphatase: 63 U/L (ref 38–126)
Anion gap: 6 (ref 5–15)
BUN: 12 mg/dL (ref 6–20)
CO2: 27 mmol/L (ref 22–32)
Calcium: 9.4 mg/dL (ref 8.9–10.3)
Chloride: 105 mmol/L (ref 98–111)
Creatinine: 0.77 mg/dL (ref 0.61–1.24)
GFR, Estimated: >60 mL/min (ref >60)
Glucose, Bld: 120 mg/dL — ABNORMAL HIGH (ref 70–99)
Potassium: 3.8 mmol/L (ref 3.5–5.1)
Sodium: 138 mmol/L (ref 135–145)
Total Bilirubin: 0.3 mg/dL (ref 0.3–1.2)
Total Protein: 7.6 g/dL (ref 6.5–8.1)

## 2023-10-23 MED ORDER — PALONOSETRON HCL INJECTION 0.25 MG/5ML
0.2500 mg | Freq: Once | INTRAVENOUS | Status: AC
  Administered 2023-10-23: 0.25 mg via INTRAVENOUS
  Filled 2023-10-23: qty 5

## 2023-10-23 MED ORDER — FLUOROURACIL CHEMO INJECTION 2.5 GM/50ML
400.0000 mg/m2 | Freq: Once | INTRAVENOUS | Status: AC
Start: 2023-10-23 — End: 2023-10-23
  Administered 2023-10-23: 1000 mg via INTRAVENOUS
  Filled 2023-10-23: qty 20

## 2023-10-23 MED ORDER — OXALIPLATIN CHEMO INJECTION 100 MG/20ML
85.0000 mg/m2 | Freq: Once | INTRAVENOUS | Status: AC
  Administered 2023-10-23: 200 mg via INTRAVENOUS
  Filled 2023-10-23: qty 40

## 2023-10-23 MED ORDER — LEUCOVORIN CALCIUM INJECTION 350 MG
400.0000 mg/m2 | Freq: Once | INTRAMUSCULAR | Status: AC
  Administered 2023-10-23: 1020 mg via INTRAVENOUS
  Filled 2023-10-23: qty 50

## 2023-10-23 MED ORDER — DEXTROSE 5 % IV SOLN
INTRAVENOUS | Status: DC
Start: 2023-10-23 — End: 2023-10-23

## 2023-10-23 MED ORDER — SODIUM CHLORIDE 0.9% FLUSH
10.0000 mL | INTRAVENOUS | Status: DC | PRN
Start: 2023-10-23 — End: 2023-10-23
  Administered 2023-10-23: 10 mL

## 2023-10-23 MED ORDER — SODIUM CHLORIDE 0.9 % IV SOLN
2400.0000 mg/m2 | INTRAVENOUS | Status: DC
  Administered 2023-10-23: 6100 mg via INTRAVENOUS
  Filled 2023-10-23: qty 122

## 2023-10-23 MED ORDER — DEXAMETHASONE SODIUM PHOSPHATE 10 MG/ML IJ SOLN
10.0000 mg | Freq: Once | INTRAMUSCULAR | Status: AC
  Administered 2023-10-23: 10 mg via INTRAVENOUS
  Filled 2023-10-23: qty 1

## 2023-10-23 MED ORDER — ACETAMINOPHEN 325 MG PO TABS
650.0000 mg | ORAL_TABLET | Freq: Once | ORAL | Status: AC
Start: 2023-10-23 — End: 2023-10-23
  Administered 2023-10-23: 650 mg via ORAL
  Filled 2023-10-23: qty 2

## 2023-10-23 NOTE — Patient Instructions (Signed)
Fluorouracil Injection What is this medication? FLUOROURACIL (flure oh YOOR a sil) treats some types of cancer. It works by slowing down the growth of cancer cells. This medicine may be used for other purposes; ask your health care provider or pharmacist if you have questions. COMMON BRAND NAME(S): Adrucil What should I tell my care team before I take this medication? They need to know if you have any of these conditions: Blood disorders Dihydropyrimidine dehydrogenase (DPD) deficiency Infection, such as chickenpox, cold sores, herpes Kidney disease Liver disease Poor nutrition Recent or ongoing radiation therapy An unusual or allergic reaction to fluorouracil, other medications, foods, dyes, or preservatives If you or your partner are pregnant or trying to get pregnant Breast-feeding How should I use this medication? This medication is injected into a vein. It is administered by your care team in a hospital or clinic setting. Talk to your care team about the use of this medication in children. Special care may be needed. Overdosage: If you think you have taken too much of this medicine contact a poison control center or emergency room at once. NOTE: This medicine is only for you. Do not share this medicine with others. What if I miss a dose? Keep appointments for follow-up doses. It is important not to miss your dose. Call your care team if you are unable to keep an appointment. What may interact with this medication? Do not take this medication with any of the following: Live virus vaccines This medication may also interact with the following: Medications that treat or prevent blood clots, such as warfarin, enoxaparin, dalteparin This list may not describe all possible interactions. Give your health care provider a list of all the medicines, herbs, non-prescription drugs, or dietary supplements you use. Also tell them if you smoke, drink alcohol, or use illegal drugs. Some items may  interact with your medicine. What should I watch for while using this medication? Your condition will be monitored carefully while you are receiving this medication. This medication may make you feel generally unwell. This is not uncommon as chemotherapy can affect healthy cells as well as cancer cells. Report any side effects. Continue your course of treatment even though you feel ill unless your care team tells you to stop. In some cases, you may be given additional medications to help with side effects. Follow all directions for their use. This medication may increase your risk of getting an infection. Call your care team for advice if you get a fever, chills, sore throat, or other symptoms of a cold or flu. Do not treat yourself. Try to avoid being around people who are sick. This medication may increase your risk to bruise or bleed. Call your care team if you notice any unusual bleeding. Be careful brushing or flossing your teeth or using a toothpick because you may get an infection or bleed more easily. If you have any dental work done, tell your dentist you are receiving this medication. Avoid taking medications that contain aspirin, acetaminophen, ibuprofen, naproxen, or ketoprofen unless instructed by your care team. These medications may hide a fever. Do not treat diarrhea with over the counter products. Contact your care team if you have diarrhea that lasts more than 2 days or if it is severe and watery. This medication can make you more sensitive to the sun. Keep out of the sun. If you cannot avoid being in the sun, wear protective clothing and sunscreen. Do not use sun lamps, tanning beds, or tanning booths. Talk to  your care team if you or your partner wish to become pregnant or think you might be pregnant. This medication can cause serious birth defects if taken during pregnancy and for 3 months after the last dose. A reliable form of contraception is recommended while taking this  medication and for 3 months after the last dose. Talk to your care team about effective forms of contraception. Do not father a child while taking this medication and for 3 months after the last dose. Use a condom while having sex during this time period. Do not breastfeed while taking this medication. This medication may cause infertility. Talk to your care team if you are concerned about your fertility. What side effects may I notice from receiving this medication? Side effects that you should report to your care team as soon as possible: Allergic reactions--skin rash, itching, hives, swelling of the face, lips, tongue, or throat Heart attack--pain or tightness in the chest, shoulders, arms, or jaw, nausea, shortness of breath, cold or clammy skin, feeling faint or lightheaded Heart failure--shortness of breath, swelling of the ankles, feet, or hands, sudden weight gain, unusual weakness or fatigue Heart rhythm changes--fast or irregular heartbeat, dizziness, feeling faint or lightheaded, chest pain, trouble breathing High ammonia level--unusual weakness or fatigue, confusion, loss of appetite, nausea, vomiting, seizures Infection--fever, chills, cough, sore throat, wounds that don't heal, pain or trouble when passing urine, general feeling of discomfort or being unwell Low red blood cell level--unusual weakness or fatigue, dizziness, headache, trouble breathing Pain, tingling, or numbness in the hands or feet, muscle weakness, change in vision, confusion or trouble speaking, loss of balance or coordination, trouble walking, seizures Redness, swelling, and blistering of the skin over hands and feet Severe or prolonged diarrhea Unusual bruising or bleeding Side effects that usually do not require medical attention (report to your care team if they continue or are bothersome): Dry skin Headache Increased tears Nausea Pain, redness, or swelling with sores inside the mouth or throat Sensitivity  to light Vomiting This list may not describe all possible side effects. Call your doctor for medical advice about side effects. You may report side effects to FDA at 1-800-FDA-1088. Where should I keep my medication? This medication is given in a hospital or clinic. It will not be stored at home. NOTE: This sheet is a summary. It may not cover all possible information. If you have questions about this medicine, talk to your doctor, pharmacist, or health care provider.  2024 [employer] (2022-04-17 00:00:00)  Leucovorin Injection What is this medication? LEUCOVORIN (loo koe VOR in) prevents side effects from certain medications, such as methotrexate. It works by increasing folate levels. This helps protect healthy cells in your body. It may also be used to treat anemia caused by low levels of folate. It can also be used with fluorouracil, a type of chemotherapy, to treat colorectal cancer. It works by increasing the effects of fluorouracil in the body. This medicine may be used for other purposes; ask your health care provider or pharmacist if you have questions. What should I tell my care team before I take this medication? They need to know if you have any of these conditions: Anemia from low levels of vitamin B12 in the blood An unusual or allergic reaction to leucovorin, folic acid, other medications, foods, dyes, or preservatives Pregnant or trying to get pregnant Breastfeeding How should I use this medication? This medication is injected into a vein or a muscle. It is given by your care  team in a hospital or clinic setting. Talk to your care team about the use of this medication in children. Special care may be needed. Overdosage: If you think you have taken too much of this medicine contact a poison control center or emergency room at once. NOTE: This medicine is only for you. Do not share this medicine with others. What if I miss a dose? Keep appointments for follow-up  doses. It is important not to miss your dose. Call your care team if you are unable to keep an appointment. What may interact with this medication? Capecitabine Fluorouracil Phenobarbital Phenytoin Primidone Trimethoprim;sulfamethoxazole This list may not describe all possible interactions. Give your health care provider a list of all the medicines, herbs, non-prescription drugs, or dietary supplements you use. Also tell them if you smoke, drink alcohol, or use illegal drugs. Some items may interact with your medicine. What should I watch for while using this medication? Your condition will be monitored carefully while you are receiving this medication. This medication may increase the side effects of 5-fluorouracil. Tell your care team if you have diarrhea or mouth sores that do not get better or that get worse. What side effects may I notice from receiving this medication? Side effects that you should report to your care team as soon as possible: Allergic reactions--skin rash, itching, hives, swelling of the face, lips, tongue, or throat This list may not describe all possible side effects. Call your doctor for medical advice about side effects. You may report side effects to FDA at 1-800-FDA-1088. Where should I keep my medication? This medication is given in a hospital or clinic. It will not be stored at home. NOTE: This sheet is a summary. It may not cover all possible information. If you have questions about this medicine, talk to your doctor, pharmacist, or health care provider.  2024 [employer] (2022-05-15 00:00:00)  Oxaliplatin Injection What is this medication? OXALIPLATIN (ox AL i PLA tin) treats colorectal cancer. It works by slowing down the growth of cancer cells. This medicine may be used for other purposes; ask your health care provider or pharmacist if you have questions. COMMON BRAND NAME(S): Eloxatin What should I tell my care team before I take this  medication? They need to know if you have any of these conditions: Heart disease History of irregular heartbeat or rhythm Liver disease Low blood cell levels (white cells, red cells, and platelets) Lung or breathing disease, such as asthma Take medications that treat or prevent blood clots Tingling of the fingers, toes, or other nerve disorder An unusual or allergic reaction to oxaliplatin, other medications, foods, dyes, or preservatives If you or your partner are pregnant or trying to get pregnant Breast-feeding How should I use this medication? This medication is injected into a vein. It is given by your care team in a hospital or clinic setting. Talk to your care team about the use of this medication in children. Special care may be needed. Overdosage: If you think you have taken too much of this medicine contact a poison control center or emergency room at once. NOTE: This medicine is only for you. Do not share this medicine with others. What if I miss a dose? Keep appointments for follow-up doses. It is important not to miss a dose. Call your care team if you are unable to keep an appointment. What may interact with this medication? Do not take this medication with any of the following: Cisapride Dronedarone Pimozide Thioridazine This medication may also  interact with the following: Aspirin and aspirin-like medications Certain medications that treat or prevent blood clots, such as warfarin, apixaban, dabigatran, and rivaroxaban Cisplatin Cyclosporine Diuretics Medications for infection, such as acyclovir, adefovir, amphotericin B, bacitracin, cidofovir, foscarnet, ganciclovir, gentamicin, pentamidine, vancomycin NSAIDs, medications for pain and inflammation, such as ibuprofen or naproxen Other medications that cause heart rhythm changes Pamidronate Zoledronic acid This list may not describe all possible interactions. Give your health care provider a list of all the  medicines, herbs, non-prescription drugs, or dietary supplements you use. Also tell them if you smoke, drink alcohol, or use illegal drugs. Some items may interact with your medicine. What should I watch for while using this medication? Your condition will be monitored carefully while you are receiving this medication. You may need blood work while taking this medication. This medication may make you feel generally unwell. This is not uncommon as chemotherapy can affect healthy cells as well as cancer cells. Report any side effects. Continue your course of treatment even though you feel ill unless your care team tells you to stop. This medication may increase your risk of getting an infection. Call your care team for advice if you get a fever, chills, sore throat, or other symptoms of a cold or flu. Do not treat yourself. Try to avoid being around people who are sick. Avoid taking medications that contain aspirin, acetaminophen, ibuprofen, naproxen, or ketoprofen unless instructed by your care team. These medications may hide a fever. Be careful brushing or flossing your teeth or using a toothpick because you may get an infection or bleed more easily. If you have any dental work done, tell your dentist you are receiving this medication. This medication can make you more sensitive to cold. Do not drink cold drinks or use ice. Cover exposed skin before coming in contact with cold temperatures or cold objects. When out in cold weather wear warm clothing and cover your mouth and nose to warm the air that goes into your lungs. Tell your care team if you get sensitive to the cold. Talk to your care team if you or your partner are pregnant or think either of you might be pregnant. This medication can cause serious birth defects if taken during pregnancy and for 9 months after the last dose. A negative pregnancy test is required before starting this medication. A reliable form of contraception is recommended while  taking this medication and for 9 months after the last dose. Talk to your care team about effective forms of contraception. Do not father a child while taking this medication and for 6 months after the last dose. Use a condom while having sex during this time period. Do not breastfeed while taking this medication and for 3 months after the last dose. This medication may cause infertility. Talk to your care team if you are concerned about your fertility. What side effects may I notice from receiving this medication? Side effects that you should report to your care team as soon as possible: Allergic reactions--skin rash, itching, hives, swelling of the face, lips, tongue, or throat Bleeding--bloody or black, tar-like stools, vomiting blood or brown material that looks like coffee grounds, red or dark brown urine, small red or purple spots on skin, unusual bruising or bleeding Dry cough, shortness of breath or trouble breathing Heart rhythm changes--fast or irregular heartbeat, dizziness, feeling faint or lightheaded, chest pain, trouble breathing Infection--fever, chills, cough, sore throat, wounds that don't heal, pain or trouble when passing urine, general feeling of  discomfort or being unwell Liver injury--right upper belly pain, loss of appetite, nausea, light-colored stool, dark yellow or brown urine, yellowing skin or eyes, unusual weakness or fatigue Low red blood cell level--unusual weakness or fatigue, dizziness, headache, trouble breathing Muscle injury--unusual weakness or fatigue, muscle pain, dark yellow or brown urine, decrease in amount of urine Pain, tingling, or numbness in the hands or feet Sudden and severe headache, confusion, change in vision, seizures, which may be signs of posterior reversible encephalopathy syndrome (PRES) Unusual bruising or bleeding Side effects that usually do not require medical attention (report to your care team if they continue or are  bothersome): Diarrhea Nausea Pain, redness, or swelling with sores inside the mouth or throat Unusual weakness or fatigue Vomiting This list may not describe all possible side effects. Call your doctor for medical advice about side effects. You may report side effects to FDA at 1-800-FDA-1088. Where should I keep my medication? This medication is given in a hospital or clinic. It will not be stored at home. NOTE: This sheet is a summary. It may not cover all possible information. If you have questions about this medicine, talk to your doctor, pharmacist, or health care provider.  2024 [employer] (2022-09-25 00:00:00)

## 2023-10-23 NOTE — Progress Notes (Signed)
[facility]   [phone] Fax:([phone]) [phone]   Clinic Follow up Note   Patient Care Team: [name], MD as PCP - General (Family Medicine) Karie Soda, MD as Consulting Physician (General Surgery) Hilarie Fredrickson, MD as Consulting Physician (Gastroenterology) Malachy Mood, MD as Consulting Physician (Medical Oncology) Suanne Marker, MD as Consulting Physician (Neurology) Laren Boom, DO as Consulting Physician (Otolaryngology)  Date of Service:  10/23/2023  CHIEF COMPLAINT: f/u of colon cancer   CURRENT THERAPY:  Adjuvant chemo FOLFOX   Oncology History   Cancer of sigmoid colon - pT4a, pN1a - s/p roboric LAR resection 09/27/2023 -pT4aN1M0 stage IIIB, MMS, G2 -Presented with perforated diverticulitis in sigmoid colon in July, 2024. -Patient underwent laparoscopic resection of sigmoid colon, path reviewed moderate differentiated adenocarcinoma with 1 positive lymph nodes. -I recommend adjuvant chemotherapy FOLFOX for 6 months if he can tolerate.   Assessment and Plan    Colon Cancer Post-operative recovery from surgery is progressing well with incisions healed and energy levels close to normal. Patient is ready to start chemotherapy. -Start combination chemotherapy with Oxaliplatin and 5-fu pump infusion every 2 weeks, first cycle today  -Monitor for side effects including nausea, taste changes, cold sensitivity, loss of appetite, and potential allergic reactions. -Administer nausea medication and steroids to prevent allergic reactions. -Check tumor markers periodically.  General Health Maintenance Patient has not yet received flu and COVID-19 vaccines. -Administer flu vaccine today. -Advise patient to get COVID-19 vaccine at pharmacy.  Follow-up in two weeks unless severe side effects from chemotherapy occur. If severe side effects occur (inability to eat, extreme fatigue, dizziness, dehydration, severe diarrhea), patient should  contact the office for potential need of IV fluids or additional medication. Next chemotherapy cycle scheduled for November 06, 2023.         SUMMARY OF ONCOLOGIC HISTORY: Oncology History  Cancer of sigmoid colon - pT4a, pN1a - s/p roboric LAR resection 09/27/2023  09/25/2023 Cancer Staging   Staging form: Colon and Rectum, AJCC 8th Edition - Pathologic stage from 09/25/2023: Stage IIIB (pT4a, pN1a, cM0) - Signed by Carlean Jews, NP on 10/09/2023 Total positive nodes: 1 Histologic grading system: 4 grade system Histologic grade (G): G2 Residual tumor (R): R0 - None   09/25/2023 Surgery   Laparoscopic resection of sigmoid colon  Invasive, moderately differentiated adenocarcinoma of sigmoid colon. Invades into werosal surface. Margins are clear. Negative lymphovascular and perineural invasion. Metastasis to 1/45 lymph nodes.    09/25/2023 Pathology Results   FINAL MICROSCOPIC DIAGNOSIS:  A.   COLON, RESECTION: - Invasive moderately differentiated adenocarcinoma, 7.8 cm involving sigmoid colon - Carcinoma invades into the serosal surface - Resection margins are negative for carcinoma - Negative for lymphovascular or perineural invasion - Metastatic carcinoma to one of forty-five lymph nodes (1/45) - See oncology table B.   COLON, DISTAL MARGIN, EXCISION: - Colonic donut within normal limits, negative for carcinoma  ONCOLOGY TABLE:  COLON AND RECTUM, CARCINOMA:  Resection, Including Transanal Disk Excision of Rectal Neoplasms  Procedure: Resection, rectosigmoid colon Tumor Site: Sigmoid colon Tumor Size: 7.8 cm Macroscopic Tumor Perforation: Not identified  Histologic Type: Adenocarcinoma Histologic Grade: G2: Moderately differentiated Multiple Primary Sites: Not applicable Tumor Extension: Carcinoma invades into the serosal surface Lymphovascular Invasion: Not identified Perineural Invasion: Not identified Treatment Effect: No known presurgical therapy Margins:       Margin Status for Invasive Carcinoma: All margins negative for invasive carcinoma      Margin Status for Non-Invasive Tumor:  All margins negative for high-grade dysplasia / intramucosal           carcinoma and low-grade dysplasia Regional Lymph Nodes:      Number of Lymph Nodes with Tumor: 1      Number of Lymph Nodes Examined: 45 Tumor Deposits: Not identified Distant Metastasis:      Distant Site(s) Involved: Not applicable Pathologic Stage Classification (pTNM, AJCC 8th Edition): pT4a, pN1a Ancillary Studies: MMR / MSI testing will be ordered. Representative Tumor Block: A2 Comments: None (v4.2.0.1)     09/30/2023 Initial Diagnosis   Cancer of sigmoid colon - pT4a, pN1a - s/p roboric LAR resection 09/27/2023   10/08/2023 Imaging   CT Chest with contrast  IMPRESSION: Negative. No evidence of thoracic metastatic disease or other significant abnormality.   10/23/2023 -  Chemotherapy   Patient is on Treatment Plan : COLORECTAL FOLFOX q14d x 6 months        Discussed the use of AI scribe software for clinical note transcription with the patient, who gave verbal consent to proceed.  History of Present Illness   A [age]-year-old patient with a recent history of colon cancer surgery presents for a follow-up visit. The patient reports a successful recovery from the surgery, with all incisions healed and dry. He estimates his energy levels to be close to normal, at about 95% of his usual capacity. The patient has had a port inserted for the upcoming chemotherapy and has attended a chemo class to prepare for the treatment. He expresses understanding of the potential side effects of the chemotherapy drugs, including nausea, taste changes, cold sensitivity, and loss of appetite. The patient is aware of the need for nausea medication during the first few days of chemotherapy and has been informed about the possibility of allergic reactions to the chemotherapy drug, oxaliplatin. The patient  has not yet received the flu or COVID-19 vaccines but plans to get the flu shot during the current visit.         All other systems were reviewed with the patient and are negative.  MEDICAL HISTORY:  Past Medical History:  Diagnosis Date   ADHD (attention deficit hyperactivity disorder)    Annual physical exam 09/09/2019   Anxiety and depression    Cancer (HCC)    Colon cancer (HCC)    COVID-19    12/28/19   Depression, recurrent (HCC) 09/09/2019   Headache 12/31/2021   History of kidney stones    Hypertriglyceridemia 07/17/2021   Insomnia    Inverted nipple 09/16/2019   Kidney stone    Mastoiditis of both sides    Nystagmus 01/22/2022   Obesity (BMI 30-39.9)    Obesity (BMI 30-39.9)    Suicidal ideation 07/14/2021   Vertigo     SURGICAL HISTORY: Past Surgical History:  Procedure Laterality Date   COLON RESECTION  09/25/2023   PORTACATH PLACEMENT Right 10/17/2023   Procedure: INSERTION PORT-A-CATH WITH Gaspar Skeeters GUIDANCE;  Surgeon: Karie Soda, MD;  Location: [facility];  Service: General;  Laterality: Right;   PROCTOSCOPY N/A 09/25/2023   Procedure: RIGID PROCTOSCOPY;  Surgeon: Karie Soda, MD;  Location: [facility];  Service: General;  Laterality: N/A;    I have reviewed the social history and family history with the patient and they are unchanged from previous note.  ALLERGIES:  has No Known Allergies.  MEDICATIONS:  Current Outpatient Medications  Medication Sig Dispense Refill   Acetaminophen (TYLENOL PO) Take 500 mg by mouth every 6 (six) hours.  buPROPion (WELLBUTRIN XL) 150 MG 24 hr tablet Take 1 tablet (150 mg total) by mouth every morning. 30 tablet 2   cyclobenzaprine (FLEXERIL) 10 MG tablet Take 1 tablet (10 mg total) by mouth 3 (three) times daily as needed for muscle spasms. 15 tablet 1   DULoxetine (CYMBALTA) 30 MG capsule Take 3 capsules (90 mg total) by mouth daily. 90 capsule 2   eszopiclone (LUNESTA) 2 MG TABS tablet Take 1  tablet (2 mg total) by mouth at bedtime as needed for sleep. Take immediately before bedtime 30 tablet 2   lidocaine-prilocaine (EMLA) cream Apply to affected area once 30 g 3   ondansetron (ZOFRAN-ODT) 8 MG disintegrating tablet Take 1 tablet (8 mg total) by mouth every 8 (eight) hours as needed for nausea or vomiting. (Patient taking differently: Take 8 mg by mouth every 8 (eight) hours as needed for nausea or vomiting. Have not started) 30 tablet 1   oxyCODONE (OXY IR/ROXICODONE) 5 MG immediate release tablet Take 1 tablet (5 mg total) by mouth every 6 (six) hours as needed for moderate pain, severe pain or breakthrough pain. 20 tablet 0   prochlorperazine (COMPAZINE) 10 MG tablet Take 1 tablet (10 mg total) by mouth every 6 (six) hours as needed for nausea or vomiting. (Patient taking differently: Take 10 mg by mouth every 6 (six) hours as needed for nausea or vomiting. Have not started) 30 tablet 1   No current facility-administered medications for this visit.    PHYSICAL EXAMINATION: ECOG PERFORMANCE STATUS: 0 - Asymptomatic  Vitals:   10/23/23 0942  BP: (!) 150/95  Pulse: 94  Resp: 18  Temp: 98.6 F (37 C)  SpO2: 99%   Wt Readings from Last 3 Encounters:  10/23/23 276 lb 4.8 oz (125.3 kg)  10/17/23 273 lb 4.8 oz (124 kg)  10/09/23 275 lb 8 oz (125 kg)     GENERAL:alert, no distress and comfortable SKIN: skin color, texture, turgor are normal, no rashes or significant lesions EYES: normal, Conjunctiva are pink and non-injected, sclera clear NECK: supple, thyroid normal size, non-tender, without nodularity LYMPH:  no palpable lymphadenopathy in the cervical, axillary  LUNGS: clear to auscultation and percussion with normal breathing effort HEART: regular rate & rhythm and no murmurs and no lower extremity edema ABDOMEN:abdomen soft, non-tender and normal bowel sounds Musculoskeletal:no cyanosis of digits and no clubbing  NEURO: alert & oriented x 3 with fluent speech, no  focal motor/sensory deficits    LABORATORY DATA:  I have reviewed the data as listed    Latest Ref Rng & Units 10/23/2023    9:01 AM 09/26/2023    4:36 AM 09/18/2023   10:07 AM  CBC  WBC 4.0 - 10.5 K/uL 6.0  10.2  7.3   Hemoglobin 13.0 - 17.0 g/dL 91.4  9.5  78.2   Hematocrit 39.0 - 52.0 % 34.8  31.3  36.6   Platelets 150 - 400 K/uL 305  278  370         Latest Ref Rng & Units 10/23/2023    9:01 AM 09/26/2023    4:36 AM 09/18/2023   10:07 AM  CMP  Glucose 70 - 99 mg/dL 956  213  086   BUN 6 - 20 mg/dL 12  8  11    Creatinine 0.61 - 1.24 mg/dL 5.78  4.69  6.29   Sodium 135 - 145 mmol/L 138  137  134   Potassium 3.5 - 5.1 mmol/L 3.8  3.6  3.9  Chloride 98 - 111 mmol/L 105  101  100   CO2 22 - 32 mmol/L 27  24  23    Calcium 8.9 - 10.3 mg/dL 9.4  8.6  9.2   Total Protein 6.5 - 8.1 g/dL 7.6   8.1   Total Bilirubin 0.3 - 1.2 mg/dL 0.3   0.6   Alkaline Phos 38 - 126 U/L 63   57   AST 15 - 41 U/L 12   15   ALT 0 - 44 U/L 13   15       RADIOGRAPHIC STUDIES: I have personally reviewed the radiological images as listed and agreed with the findings in the report. No results found.    Orders Placed This Encounter  Procedures   [facility] (IN HOUSE-[facility])    Standing Status:   Standing    Number of Occurrences:   20    Standing Expiration Date:   10/22/2024   Ferritin    Standing Status:   Standing    Number of Occurrences:   20    Standing Expiration Date:   10/22/2024   All questions were answered. The patient knows to call the clinic with any problems, questions or concerns. No barriers to learning was detected. The total time spent in the appointment was 25 minutes.     Malachy Mood, MD 10/23/2023

## 2023-10-25 ENCOUNTER — Inpatient Hospital Stay: Payer: BC Managed Care – PPO | Attending: Nurse Practitioner

## 2023-10-25 VITALS — BP 159/88 | HR 86 | Temp 98.8°F | Resp 18

## 2023-10-25 DIAGNOSIS — Z5111 Encounter for antineoplastic chemotherapy: Secondary | ICD-10-CM | POA: Diagnosis not present

## 2023-10-25 DIAGNOSIS — R110 Nausea: Secondary | ICD-10-CM | POA: Insufficient documentation

## 2023-10-25 DIAGNOSIS — D6481 Anemia due to antineoplastic chemotherapy: Secondary | ICD-10-CM | POA: Diagnosis not present

## 2023-10-25 DIAGNOSIS — Z452 Encounter for adjustment and management of vascular access device: Secondary | ICD-10-CM | POA: Insufficient documentation

## 2023-10-25 DIAGNOSIS — C187 Malignant neoplasm of sigmoid colon: Secondary | ICD-10-CM | POA: Insufficient documentation

## 2023-10-25 DIAGNOSIS — D701 Agranulocytosis secondary to cancer chemotherapy: Secondary | ICD-10-CM | POA: Insufficient documentation

## 2023-10-25 DIAGNOSIS — Z79899 Other long term (current) drug therapy: Secondary | ICD-10-CM | POA: Insufficient documentation

## 2023-10-25 DIAGNOSIS — Z95828 Presence of other vascular implants and grafts: Secondary | ICD-10-CM

## 2023-10-25 MED ORDER — SODIUM CHLORIDE 0.9% FLUSH
10.0000 mL | INTRAVENOUS | Status: DC | PRN
  Administered 2023-10-25: 10 mL

## 2023-10-25 MED ORDER — HEPARIN SOD (PORK) LOCK FLUSH 100 UNIT/ML IV SOLN
500.0000 [IU] | Freq: Once | INTRAVENOUS | Status: AC | PRN
Start: 2023-10-25 — End: 2023-10-25
  Administered 2023-10-25: 500 [IU]

## 2023-10-28 ENCOUNTER — Inpatient Hospital Stay: Payer: BC Managed Care – PPO

## 2023-10-28 ENCOUNTER — Inpatient Hospital Stay: Payer: BC Managed Care – PPO | Admitting: Genetic Counselor

## 2023-10-28 ENCOUNTER — Other Ambulatory Visit: Payer: Self-pay | Admitting: Genetic Counselor

## 2023-10-28 DIAGNOSIS — Z5111 Encounter for antineoplastic chemotherapy: Secondary | ICD-10-CM | POA: Diagnosis not present

## 2023-10-28 DIAGNOSIS — Z79899 Other long term (current) drug therapy: Secondary | ICD-10-CM | POA: Diagnosis not present

## 2023-10-28 DIAGNOSIS — D701 Agranulocytosis secondary to cancer chemotherapy: Secondary | ICD-10-CM | POA: Diagnosis not present

## 2023-10-28 DIAGNOSIS — Z85048 Personal history of other malignant neoplasm of rectum, rectosigmoid junction, and anus: Secondary | ICD-10-CM | POA: Diagnosis not present

## 2023-10-28 DIAGNOSIS — C187 Malignant neoplasm of sigmoid colon: Secondary | ICD-10-CM

## 2023-10-28 DIAGNOSIS — Z1379 Encounter for other screening for genetic and chromosomal anomalies: Secondary | ICD-10-CM

## 2023-10-28 DIAGNOSIS — Z8042 Family history of malignant neoplasm of prostate: Secondary | ICD-10-CM | POA: Diagnosis not present

## 2023-10-28 DIAGNOSIS — D6481 Anemia due to antineoplastic chemotherapy: Secondary | ICD-10-CM | POA: Diagnosis not present

## 2023-10-28 DIAGNOSIS — Z452 Encounter for adjustment and management of vascular access device: Secondary | ICD-10-CM | POA: Diagnosis not present

## 2023-10-28 DIAGNOSIS — R110 Nausea: Secondary | ICD-10-CM | POA: Diagnosis not present

## 2023-10-28 DIAGNOSIS — D500 Iron deficiency anemia secondary to blood loss (chronic): Secondary | ICD-10-CM

## 2023-10-28 LAB — FERRITIN: Ferritin: 51 ng/mL (ref 24–336)

## 2023-10-28 LAB — CEA (IN HOUSE-CHCC): CEA (CHCC-In House): <1 ng/mL (ref 0.00–5.00)

## 2023-10-28 LAB — GENETIC SCREENING ORDER

## 2023-10-28 NOTE — Progress Notes (Signed)
BH MD/PA/NP OP Progress Note  11/01/2023 8:58 AM Cory Gomez  MRN:  [id number]  Visit Diagnosis:    ICD-10-CM   1. Major depressive disorder, recurrent episode, mild (HCC)  F33.0 DULoxetine (CYMBALTA) 60 MG capsule    2. GAD (generalized anxiety disorder)  F41.1 DULoxetine (CYMBALTA) 60 MG capsule    propranolol (INDERAL) 10 MG tablet      [name] is a 42 y.o. male with a history of MDD, GAD, ADHD, binge eating disorder, colon cancer stage 3b in treatment, and Vit D deficiency who presented to [facility] at [facility] for initial evaluation on 01/15/2023.  At initial evaluation patient reported neurovegetative symptoms of depression including low mood, anhedonia, amotivation, worthlessness, insomnia, increased appetite, and occasional passive SI.  He denied any intent or plan.  Patient was able to complete a safety plan and lists his mother as a good support.  Patient furthermore endorsed symptoms of anxiety including feeling nervous or on edge, being unable to stop or control his worrying, difficulty relaxing, increased irritability, restlessness, and fears that something awful might happen.  Psychosocially patient has increased stress from financial struggles, difficulty at work, and interpersonal struggles in his marriage.  Patient meets criteria for MDD and generalized anxiety disorder with further clarity needed to rule out ADHD.  He was referred for neuropsych testing.  Of note due to patient's sleep troubles and body habitus there is some concern for sleep apnea and he was referred to a sleep specialist.  Cory Gomez presents for follow-up evaluation. Today, 11/01/23, patient reports that he has experienced increased anxiety since being diagnosed with colon cancer.  Baseline anxiety is increased with excessive worry, fears something awful happening, and fear of future.  He also has acute anxiety flareups during times of  chemotherapy with elevated blood pressures noted.  We will titrate Cymbalta to 60 mg twice daily and start propranolol 10 mg twice daily as needed for anxiety.  Risk and benefits of both medications were discussed.  Patient will also continue with therapy which she has found to be beneficial.  We will follow up in a month.  Plan: - Increase Cymbalta to 60 mg BID - Continue Wellbutrin XL 150 mg every day - Start propranolol 10 mg BID prn for anxiety - Continue Lunesta 2 mg QHS as needed for insomnia - Neuropsych referral - Sleep specialist appointment delayed until cancer piece is resolved  - Continue therapy with Cory Gomez twice a month - Crisis resources reviewed - Follow up in 1 month  Chief Complaint:  Chief Complaint  Patient presents with   Follow-up   HPI: Cory Gomez presents reporting that he is struggling a lot with anxiety lately. His anxiety has gone up significantly during the chemo, both due to the side effects and how this effects his future. Such as what the lifetime follow up would like for him or what happens if the cancer does not get better. He has treatment every other week with chemo for his colon cancer and expects it to last around 6 months.   Cory Gomez notes that the anxiety is constant with flare ups around the time of his treatments. He has a really hard time shutting his brain off. Therapy has helped him to a degree but still has a lot of residual anxiety.   He has not taken Lunesta due to fear of being oversedated. Discussed the sedation risk and encouraged patient to give the medication a try  on a weekend. For his anxiety we will increase Cymbalta to 60 mg BID and start propranolol 10 mg BID prn for anxiety. Risks and benefits were explained.   Past Psychiatric History: First episode of depression was in early elementary school after his parents divorced. He was in counseling and prescribed bupropion then. Later, as an adult, he was briefly on fluoxetine with no  clear benefit. While in school he was diagnosed with having ADHD inattentive type and was on Ritalin. He had seen Dr. Hinton Dyer from 2020-2022. He has no hx of inpatient psychiatric admissions, no hx of mania or overt psychosis. Denies any prior suicide attempts.   Has tried Adderall, Vyvanse, Ritalin, bupropion, Prozac, Zoloft, Cymbalta, trazodone, Zolpidem, topirimate, Lunesta   He does not abuse alcohol or street drugs.  Past Medical History:  Past Medical History:  Diagnosis Date   ADHD (attention deficit hyperactivity disorder)    Annual physical exam 09/09/2019   Anxiety and depression    Cancer (HCC)    Colon cancer (HCC)    COVID-19    12/28/19   Depression, recurrent (HCC) 09/09/2019   Headache 12/31/2021   History of kidney stones    Hypertriglyceridemia 07/17/2021   Insomnia    Inverted nipple 09/16/2019   Kidney stone    Mastoiditis of both sides    Nystagmus 01/22/2022   Obesity (BMI 30-39.9)    Obesity (BMI 30-39.9)    Suicidal ideation 07/14/2021   Vertigo     Past Surgical History:  Procedure Laterality Date   COLON RESECTION  09/25/2023   PORTACATH PLACEMENT Right 10/17/2023   Procedure: INSERTION PORT-A-CATH WITH Gaspar Skeeters GUIDANCE;  Surgeon: Karie Soda, MD;  Location: [facility];  Service: General;  Laterality: Right;   PROCTOSCOPY N/A 09/25/2023   Procedure: RIGID PROCTOSCOPY;  Surgeon: Karie Soda, MD;  Location: [facility];  Service: General;  Laterality: N/A;    Family History:  Family History  Problem Relation Age of Onset   Diabetes Mother    Hypertension Mother    Hyperlipidemia Mother    Diabetes Mellitus II Mother    Anxiety disorder Mother    Depression Mother    Basal cell carcinoma Mother 77 - 67   Stroke Father    Alcoholism Father    Anxiety disorder Brother    Depression Brother    Hypertension Brother    Hyperlipidemia Brother    Diabetes type II Brother    ADD / ADHD Brother    Squamous cell carcinoma  Maternal Grandmother    Dementia Maternal Grandfather    Basal cell carcinoma Maternal Grandfather    Prostate cancer Maternal Grandfather 70   Lung cancer Paternal Grandfather 52       smoked   Stomach cancer Neg Hx    Colon cancer Neg Hx    Esophageal cancer Neg Hx    Pancreatic cancer Neg Hx     Social History:  Social History   Socioeconomic History   Marital status: Married    Spouse name: Not on file   Number of children: Not on file   Years of education: Not on file   Highest education level: Not on file  Occupational History   Not on file  Tobacco Use   Smoking status: Never   Smokeless tobacco: Never  Vaping Use   Vaping status: Never Used  Substance and Sexual Activity   Alcohol use: Never   Drug use: Never   Sexual activity: Yes    Partners: Female  Other Topics Concern   Not on file  Social History Narrative   Married no kids wife is Fabiola Mckethan    -wife and mother [name] on [place]      Has siblings    Works Educational psychologist IT [employer]   1 dog    Social Determinants of Health   Financial Resource Strain: High Risk (10/11/2023)   Overall Financial Resource Strain (CARDIA)    Difficulty of Paying Living Expenses: Very hard  Food Insecurity: Food Insecurity Present (09/30/2023)   Hunger Vital Sign    Worried About Running Out of Food in the Last Year: Sometimes true    Ran Out of Food in the Last Year: Sometimes true  Transportation Needs: No Transportation Needs (09/30/2023)   PRAPARE - Administrator, Civil Service (Medical): No    Lack of Transportation (Non-Medical): No  Physical Activity: Not on file  Stress: Not on file  Social Connections: Not on file    Allergies: No Known Allergies  Current Medications: Current Outpatient Medications  Medication Sig Dispense Refill   propranolol (INDERAL) 10 MG tablet Take 1 tablet (10 mg total) by mouth 2 (two) times daily as needed. 60 tablet 2   Acetaminophen (TYLENOL PO) Take 500 mg  by mouth every 6 (six) hours.     buPROPion (WELLBUTRIN XL) 150 MG 24 hr tablet Take 1 tablet (150 mg total) by mouth every morning. 30 tablet 2   cyclobenzaprine (FLEXERIL) 10 MG tablet Take 1 tablet (10 mg total) by mouth 3 (three) times daily as needed for muscle spasms. 15 tablet 1   DULoxetine (CYMBALTA) 60 MG capsule Take 1 capsule (60 mg total) by mouth 2 (two) times daily. 60 capsule 2   eszopiclone (LUNESTA) 2 MG TABS tablet Take 1 tablet (2 mg total) by mouth at bedtime as needed for sleep. Take immediately before bedtime 30 tablet 2   lidocaine-prilocaine (EMLA) cream Apply to affected area once 30 g 3   ondansetron (ZOFRAN-ODT) 8 MG disintegrating tablet Take 1 tablet (8 mg total) by mouth every 8 (eight) hours as needed for nausea or vomiting. (Patient taking differently: Take 8 mg by mouth every 8 (eight) hours as needed for nausea or vomiting. Have not started) 30 tablet 1   oxyCODONE (OXY IR/ROXICODONE) 5 MG immediate release tablet Take 1 tablet (5 mg total) by mouth every 6 (six) hours as needed for moderate pain, severe pain or breakthrough pain. 20 tablet 0   prochlorperazine (COMPAZINE) 10 MG tablet Take 1 tablet (10 mg total) by mouth every 6 (six) hours as needed for nausea or vomiting. (Patient taking differently: Take 10 mg by mouth every 6 (six) hours as needed for nausea or vomiting. Have not started) 30 tablet 1   No current facility-administered medications for this visit.     Psychiatric Specialty Exam: Review of Systems  There were no vitals taken for this visit.There is no height or weight on file to calculate BMI.  General Appearance: Well Groomed  Eye Contact:  Good  Speech:  Clear and Coherent and Normal Rate  Volume:  Normal  Mood:  Anxious  Affect:  Congruent  Thought Process:  Coherent and Goal Directed  Orientation:  Full (Time, Place, and Person)  Thought Content: Logical   Suicidal Thoughts:  No  Homicidal Thoughts:  No  Memory:  NA  Judgement:   Fair  Insight:  Fair  Psychomotor Activity:  Normal  Concentration:  Concentration: Good  Recall:  [place] Major of Knowledge: Fair  Language: Good  Akathisia:  NA    AIMS (if indicated): not done  Assets:  Passenger transport manager Vocational/Educational  ADL's:  Intact  Cognition: WNL  Sleep:  Good   Metabolic Disorder Labs: Lab Results  Component Value Date   HGBA1C 5.7 (H) 09/25/2023   MPG 116.89 09/25/2023   No results found for: "PROLACTIN" Lab Results  Component Value Date   CHOL 187 05/08/2023   TRIG 178.0 (H) 05/08/2023   HDL 33.80 (L) 05/08/2023   CHOLHDL 6 05/08/2023   VLDL 35.6 05/08/2023   LDLCALC 117 (H) 05/08/2023   LDLCALC 102 (H) 06/11/2019   Lab Results  Component Value Date   TSH 3.16 05/08/2023   TSH 2.91 06/14/2021    Therapeutic Level Labs: No results found for: "LITHIUM" No results found for: "VALPROATE" No results found for: "CBMZ"   Screenings: GAD-7    Flowsheet Row Office Visit from 07/18/2023 in [facility] Visit from 05/29/2023 in [facility] [employer] Visit from 03/12/2023 in [facility] [employer] Visit from [facility]/2023 [facility] Office Visit from 12/03/2022 in [facility]  Total GAD-7 Score 13 6 8 18 20       PHQ2-9    Flowsheet Row Office Visit from 07/18/2023 in [facility] [place] [facility] Visit from 05/29/2023 in [facility] Visit from 03/12/2023 in [facility] [place] [facility] [employer] Visit from 01[facility]15/2023 in [facility] Office Visit from 12/03/2022 in [facility] [place] [facility] Total Score 2 2 2 5 5   PHQ-9 Total Score 7 10 5  19 23       Flowsheet Row Admission (Discharged) from 10/17/2023 in [facility] Admission (Discharged) from 09/25/2023 in [facility] [place] [facility] Pre-Admission Testing 60 from 09/18/2023 in [place] [facility]  C-SSRS RISK CATEGORY No Risk No Risk No Risk       Collaboration of Care: Collaboration of Care: Medication Management AEB medication prescription, Other provider involved in patient's care AEB oncology chart review, and Referral or follow-up with counselor/therapist AEB chart review  Patient/Guardian was advised Release of Information must be obtained prior to any record release in order to collaborate their care with an outside provider. Patient/Guardian was advised if they have not already done so to contact the registration department to sign all necessary forms in order for [place] to release information regarding their care.   Consent: Patient/Guardian gives verbal consent for treatment and assignment of benefits for services provided during this visit. Patient/Guardian expressed understanding and agreed to proceed.    Stasia Cavalier, MD 11/01/2023, 8:58 AM   Virtual Visit via Video Note  I connected with Cory Gomez on 11/01/23 at  8:00 AM EST by a video enabled telemedicine application and verified that I am speaking with the correct person using two identifiers.  Location: Patient: Home Provider: Home Office   I discussed the limitations of evaluation and management by telemedicine and the availability of in person appointments. The patient expressed understanding and agreed to proceed.   I discussed the assessment and treatment plan with the patient. The patient was provided an opportunity to ask questions and all were answered. The patient agreed with the plan and demonstrated an understanding of  the instructions.   The patient was advised to call back or seek an in-person evaluation if the symptoms worsen or if the condition fails to  improve as anticipated.  I provided 30 minutes of non-face-to-face time during this encounter.   Stasia Cavalier, MD

## 2023-10-30 ENCOUNTER — Encounter: Payer: Self-pay | Admitting: Genetic Counselor

## 2023-10-30 ENCOUNTER — Encounter: Payer: Self-pay | Admitting: Hematology

## 2023-10-30 NOTE — Progress Notes (Signed)
REFERRING PROVIDER: Malachy Mood, MD  PRIMARY PROVIDER:  [name], MD  PRIMARY REASON FOR VISIT:  1. Cancer of sigmoid colon - pT4a, pN1a - s/p roboric LAR resection 09/27/2023   2. Family history of prostate cancer    HISTORY OF PRESENT ILLNESS:   Mr. Vanwyk, a 42 y.o. male, was seen for a [facility] cancer genetics consultation at the request of Dr. Mosetta Putt due to a personal and family history of cancer. Mr. Odonovan presents to clinic today to discuss the possibility of a hereditary predisposition to cancer, to discuss genetic testing, and to further clarify his future cancer risks, as well as potential cancer risks for family members.   In October 2024, at the age of 44, Mr. Ruta was diagnosed with colon cancer (MSI stable/normal IHC).  CANCER HISTORY:  Oncology History  Cancer of sigmoid colon - pT4a, pN1a - s/p roboric LAR resection 09/27/2023  09/25/2023 Cancer Staging   Staging form: Colon and Rectum, AJCC 8th Edition - Pathologic stage from 09/25/2023: Stage IIIB (pT4a, pN1a, cM0) - Signed by Carlean Jews, NP on 10/09/2023 Total positive nodes: 1 Histologic grading system: 4 grade system Histologic grade (G): G2 Residual tumor (R): R0 - None   09/25/2023 Surgery   Laparoscopic resection of sigmoid colon  Invasive, moderately differentiated adenocarcinoma of sigmoid colon. Invades into werosal surface. Margins are clear. Negative lymphovascular and perineural invasion. Metastasis to 1/45 lymph nodes.    09/25/2023 Pathology Results   FINAL MICROSCOPIC DIAGNOSIS:  A.   COLON, RESECTION: - Invasive moderately differentiated adenocarcinoma, 7.8 cm involving sigmoid colon - Carcinoma invades into the serosal surface - Resection margins are negative for carcinoma - Negative for lymphovascular or perineural invasion - Metastatic carcinoma to one of forty-five lymph nodes (1/45) - See oncology table B.   COLON, DISTAL MARGIN, EXCISION: - Colonic donut within normal limits,  negative for carcinoma  ONCOLOGY TABLE:  COLON AND RECTUM, CARCINOMA:  Resection, Including Transanal Disk Excision of Rectal Neoplasms  Procedure: Resection, rectosigmoid colon Tumor Site: Sigmoid colon Tumor Size: 7.8 cm Macroscopic Tumor Perforation: Not identified  Histologic Type: Adenocarcinoma Histologic Grade: G2: Moderately differentiated Multiple Primary Sites: Not applicable Tumor Extension: Carcinoma invades into the serosal surface Lymphovascular Invasion: Not identified Perineural Invasion: Not identified Treatment Effect: No known presurgical therapy Margins:      Margin Status for Invasive Carcinoma: All margins negative for invasive carcinoma      Margin Status for Non-Invasive Tumor: All margins negative for high-grade dysplasia / intramucosal           carcinoma and low-grade dysplasia Regional Lymph Nodes:      Number of Lymph Nodes with Tumor: 1      Number of Lymph Nodes Examined: 45 Tumor Deposits: Not identified Distant Metastasis:      Distant Site(s) Involved: Not applicable Pathologic Stage Classification (pTNM, AJCC 8th Edition): pT4a, pN1a Ancillary Studies: MMR / MSI testing will be ordered. Representative Tumor Block: A2 Comments: None (v4.2.0.1)     09/30/2023 Initial Diagnosis   Cancer of sigmoid colon - pT4a, pN1a - s/p roboric LAR resection 09/27/2023   10/08/2023 Imaging   CT Chest with contrast  IMPRESSION: Negative. No evidence of thoracic metastatic disease or other significant abnormality.   10/23/2023 -  Chemotherapy   Patient is on Treatment Plan : COLORECTAL FOLFOX q14d x 6 months       Past Medical History:  Diagnosis Date   ADHD (attention deficit hyperactivity disorder)    Annual physical  exam 09/09/2019   Anxiety and depression    Cancer (HCC)    Colon cancer (HCC)    COVID-19    12/28/19   Depression, recurrent (HCC) 09/09/2019   Headache 12/31/2021   History of kidney stones    Hypertriglyceridemia 07/17/2021    Insomnia    Inverted nipple 09/16/2019   Kidney stone    Mastoiditis of both sides    Nystagmus 01/22/2022   Obesity (BMI 30-39.9)    Obesity (BMI 30-39.9)    Suicidal ideation 07/14/2021   Vertigo     Past Surgical History:  Procedure Laterality Date   COLON RESECTION  09/25/2023   PORTACATH PLACEMENT Right 10/17/2023   Procedure: INSERTION PORT-A-CATH WITH Gaspar Skeeters GUIDANCE;  Surgeon: Karie Soda, MD;  Location: [facility];  Service: General;  Laterality: Right;   PROCTOSCOPY N/A 09/25/2023   Procedure: RIGID PROCTOSCOPY;  Surgeon: Karie Soda, MD;  Location: [facility];  Service: General;  Laterality: N/A;    Social History   Socioeconomic History   Marital status: Married    Spouse name: Not on file   Number of children: Not on file   Years of education: Not on file   Highest education level: Not on file  Occupational History   Not on file  Tobacco Use   Smoking status: Never   Smokeless tobacco: Never  Vaping Use   Vaping status: Never Used  Substance and Sexual Activity   Alcohol use: Never   Drug use: Never   Sexual activity: Yes    Partners: Female  Other Topics Concern   Not on file  Social History Narrative   Married no kids wife is Barnie Sopko    -wife and mother [name] on [place]      Has siblings    Works Educational psychologist IT [employer]   1 dog    Social Determinants of Health   Financial Resource Strain: High Risk (10/11/2023)   Overall Financial Resource Strain (CARDIA)    Difficulty of Paying Living Expenses: Very hard  Food Insecurity: Food Insecurity Present (09/30/2023)   Hunger Vital Sign    Worried About Running Out of Food in the Last Year: Sometimes true    Ran Out of Food in the Last Year: Sometimes true  Transportation Needs: No Transportation Needs (09/30/2023)   PRAPARE - Administrator, Civil Service (Medical): No    Lack of Transportation (Non-Medical): No  Physical Activity: Not on file  Stress:  Not on file  Social Connections: Not on file     FAMILY HISTORY:  We obtained a detailed, 4-generation family history.  Significant diagnoses are listed below: Family History  Problem Relation Age of Onset   Diabetes Mother    Hypertension Mother    Hyperlipidemia Mother    Diabetes Mellitus II Mother    Anxiety disorder Mother    Depression Mother    Basal cell carcinoma Mother 41 - 73   Stroke Father    Alcoholism Father    Anxiety disorder Brother    Depression Brother    Hypertension Brother    Hyperlipidemia Brother    Diabetes type II Brother    ADD / ADHD Brother    Squamous cell carcinoma Maternal Grandmother    Dementia Maternal Grandfather    Basal cell carcinoma Maternal Grandfather    Prostate cancer Maternal Grandfather 78   Lung cancer Paternal Grandfather 46       smoked   Stomach cancer  Neg Hx    Colon cancer Neg Hx    Esophageal cancer Neg Hx    Pancreatic cancer Neg Hx      Mr. Scurlock is unaware of previous family history of genetic testing for hereditary cancer risks. There is no reported Ashkenazi Jewish ancestry.   GENETIC COUNSELING ASSESSMENT: Mr. Cosman is a 42 y.o. male with a personal and family history of cancer which is somewhat suggestive of a hereditary predisposition to cancer given his young age at diagnosis. We, therefore, discussed and recommended the following at today's visit.   DISCUSSION: We discussed that 5 - 10% of cancer is hereditary, with most cases of colon cancer associated with Lynch Syndrome.  There are other genes that can be associated with hereditary colon cancer syndromes.  We discussed that testing is beneficial for several reasons including knowing how to follow individuals after completing their treatment and understanding if other family members could be at risk for cancer and allowing them to undergo genetic testing.   We reviewed the characteristics, features and inheritance patterns of hereditary cancer syndromes. We  also discussed genetic testing, including the appropriate family members to test, the process of testing, insurance coverage and turn-around-time for results. We discussed the implications of a negative, positive, carrier and/or variant of uncertain significant result. We recommended Mr. Wickham pursue genetic testing for a panel that includes genes associated with colon and prostate cancer.   Mr. Kerstein  was offered a common hereditary cancer panel (48 genes) and an expanded pan-cancer panel (71 genes). Mr. Lovejoy was informed of the benefits and limitations of each panel, including that expanded pan-cancer panels contain genes that do not have clear management guidelines at this point in time.  We also discussed that as the number of genes included on a panel increases, the chances of variants of uncertain significance increases. After considering the benefits and limitations of each gene panel, Mr. Lamorte elected to have [facility] CancerNext-Expanded Panel.  The CancerNext-Expanded gene panel offered by [facility] and includes sequencing, rearrangement, and RNA analysis for the following 71 genes: AIP, ALK, APC, ATM, AXIN2, BAP1, BARD1, BMPR1A, BRCA1, BRCA2, BRIP1, CDC73, CDH1, CDK4, CDKN1B, CDKN2A, CHEK2, CTNNA1, DICER1, FH, FLCN, KIF1B, LZTR1, MAX, MEN1, MET, MLH1, MSH2, MSH3, MSH6, MUTYH, NF1, NF2, NTHL1, PALB2, PHOX2B, PMS2, POT1, PRKAR1A, PTCH1, PTEN, RAD51C, RAD51D, RB1, RET, SDHA, SDHAF2, SDHB, SDHC, SDHD, SMAD4, SMARCA4, SMARCB1, SMARCE1, STK11, SUFU, TMEM127, TP53, TSC1, TSC2, and VHL (sequencing and deletion/duplication); EGFR, EGLN1, HOXB13, KIT, MITF, PDGFRA, POLD1, and POLE (sequencing only); EPCAM and GREM1 (deletion/duplication only).   Based on Mr. Bearden personal and family history of cancer, he meets medical criteria for genetic testing. Despite that he meets criteria, he may still have an out of pocket cost. We discussed that if his out of pocket cost for testing is over $100, the  laboratory should contact them to discuss self-pay prices, patient pay assistance programs, if applicable, and other billing options.  PLAN: After considering the risks, benefits, and limitations, Mr. Coudriet provided informed consent to pursue genetic testing and the blood sample was sent to [facility] for analysis of the CancerNext-Expanded Panel. Results should be available within approximately 2-3 weeks' time, at which point they will be disclosed by telephone to Mr. Krohn, as will any additional recommendations warranted by these results. Mr. Nitschke will receive a summary of his genetic counseling visit and a copy of his results once available. This information will also be available in Epic.   Mr. Seiden questions  were answered to his satisfaction today. Our contact information was provided should additional questions or concerns arise. Thank you for the referral and allowing [place] to share in the care of your patient.   Lalla Brothers, MS, [facility] Genetic Counselor [email or web address] (P) [phone]  The patient was seen for a total of 55 minutes in face-to-face genetic counseling.  The patient brought his mother. Drs. Pamelia Hoit and/or Mosetta Putt were available to discuss this case as needed.   _______________________________________________________________________ For Office Staff:  Number of people involved in session: 2 Was an Intern/ student involved with case: yes, Asher Muir

## 2023-11-01 ENCOUNTER — Encounter (HOSPITAL_COMMUNITY): Payer: Self-pay | Admitting: Psychiatry

## 2023-11-01 ENCOUNTER — Telehealth (HOSPITAL_BASED_OUTPATIENT_CLINIC_OR_DEPARTMENT_OTHER): Payer: BC Managed Care – PPO | Admitting: Psychiatry

## 2023-11-01 DIAGNOSIS — F330 Major depressive disorder, recurrent, mild: Secondary | ICD-10-CM

## 2023-11-01 DIAGNOSIS — F411 Generalized anxiety disorder: Secondary | ICD-10-CM | POA: Diagnosis not present

## 2023-11-01 MED ORDER — PROPRANOLOL HCL 10 MG PO TABS
10.0000 mg | ORAL_TABLET | Freq: Two times a day (BID) | ORAL | 2 refills | Status: DC | PRN
Start: 2023-11-01 — End: 2024-01-27

## 2023-11-01 MED ORDER — DULOXETINE HCL 60 MG PO CPEP: 60.0000 mg | ORAL_CAPSULE | Freq: Two times a day (BID) | ORAL | 2 refills | Status: DC

## 2023-11-06 ENCOUNTER — Inpatient Hospital Stay: Payer: BC Managed Care – PPO

## 2023-11-06 ENCOUNTER — Encounter: Payer: Self-pay | Admitting: Nurse Practitioner

## 2023-11-06 ENCOUNTER — Other Ambulatory Visit: Payer: Self-pay | Admitting: Hematology

## 2023-11-06 ENCOUNTER — Inpatient Hospital Stay (HOSPITAL_BASED_OUTPATIENT_CLINIC_OR_DEPARTMENT_OTHER): Payer: BC Managed Care – PPO | Admitting: Nurse Practitioner

## 2023-11-06 VITALS — BP 120/80 | HR 86 | Temp 98.0°F | Resp 18 | Ht 74.0 in | Wt 277.0 lb

## 2023-11-06 DIAGNOSIS — C187 Malignant neoplasm of sigmoid colon: Secondary | ICD-10-CM

## 2023-11-06 DIAGNOSIS — D500 Iron deficiency anemia secondary to blood loss (chronic): Secondary | ICD-10-CM

## 2023-11-06 DIAGNOSIS — Z79899 Other long term (current) drug therapy: Secondary | ICD-10-CM | POA: Diagnosis not present

## 2023-11-06 DIAGNOSIS — Z452 Encounter for adjustment and management of vascular access device: Secondary | ICD-10-CM | POA: Diagnosis not present

## 2023-11-06 DIAGNOSIS — Z5111 Encounter for antineoplastic chemotherapy: Secondary | ICD-10-CM | POA: Diagnosis not present

## 2023-11-06 DIAGNOSIS — D701 Agranulocytosis secondary to cancer chemotherapy: Secondary | ICD-10-CM | POA: Diagnosis not present

## 2023-11-06 DIAGNOSIS — D6481 Anemia due to antineoplastic chemotherapy: Secondary | ICD-10-CM | POA: Diagnosis not present

## 2023-11-06 DIAGNOSIS — Z95828 Presence of other vascular implants and grafts: Secondary | ICD-10-CM

## 2023-11-06 DIAGNOSIS — R110 Nausea: Secondary | ICD-10-CM | POA: Diagnosis not present

## 2023-11-06 LAB — CMP (CANCER CENTER ONLY)
ALT: 10 U/L (ref 0–44)
AST: 10 U/L — ABNORMAL LOW (ref 15–41)
Albumin: 4.3 g/dL (ref 3.5–5.0)
Alkaline Phosphatase: 67 U/L (ref 38–126)
Anion gap: 6 (ref 5–15)
BUN: 21 mg/dL — ABNORMAL HIGH (ref 6–20)
CO2: 27 mmol/L (ref 22–32)
Calcium: 9.3 mg/dL (ref 8.9–10.3)
Chloride: 105 mmol/L (ref 98–111)
Creatinine: 0.73 mg/dL (ref 0.61–1.24)
GFR, Estimated: >60 mL/min (ref >60)
Glucose, Bld: 121 mg/dL — ABNORMAL HIGH (ref 70–99)
Potassium: 3.6 mmol/L (ref 3.5–5.1)
Sodium: 138 mmol/L (ref 135–145)
Total Bilirubin: 0.3 mg/dL (ref <1.2)
Total Protein: 7.5 g/dL (ref 6.5–8.1)

## 2023-11-06 LAB — CBC WITH DIFFERENTIAL (CANCER CENTER ONLY)
Abs Immature Granulocytes: 0.01 10*3/uL (ref 0.00–0.07)
Basophils Absolute: 0 10*3/uL (ref 0.0–0.1)
Basophils Relative: 1 %
Eosinophils Absolute: 0.1 10*3/uL (ref 0.0–0.5)
Eosinophils Relative: 2 %
HCT: 33.8 % — ABNORMAL LOW (ref 39.0–52.0)
Hemoglobin: 10.8 g/dL — ABNORMAL LOW (ref 13.0–17.0)
Immature Granulocytes: 0 %
Lymphocytes Relative: 24 %
Lymphs Abs: 0.9 10*3/uL (ref 0.7–4.0)
MCH: 24.5 pg — ABNORMAL LOW (ref 26.0–34.0)
MCHC: 32 g/dL (ref 30.0–36.0)
MCV: 76.8 fL — ABNORMAL LOW (ref 80.0–100.0)
Monocytes Absolute: 0.3 10*3/uL (ref 0.1–1.0)
Monocytes Relative: 7 %
Neutro Abs: 2.5 10*3/uL (ref 1.7–7.7)
Neutrophils Relative %: 66 %
Platelet Count: 298 10*3/uL (ref 150–400)
RBC: 4.4 MIL/uL (ref 4.22–5.81)
RDW: 14.7 % (ref 11.5–15.5)
WBC Count: 3.8 10*3/uL — ABNORMAL LOW (ref 4.0–10.5)
nRBC: 0 % (ref 0.0–0.2)

## 2023-11-06 MED ORDER — PALONOSETRON HCL INJECTION 0.25 MG/5ML
0.2500 mg | Freq: Once | INTRAVENOUS | Status: AC
  Administered 2023-11-06: 0.25 mg via INTRAVENOUS
  Filled 2023-11-06: qty 5

## 2023-11-06 MED ORDER — LEUCOVORIN CALCIUM INJECTION 350 MG
400.0000 mg/m2 | Freq: Once | INTRAVENOUS | Status: AC
  Administered 2023-11-06: 1020 mg via INTRAVENOUS
  Filled 2023-11-06: qty 50

## 2023-11-06 MED ORDER — DEXTROSE 5 % IV SOLN: INTRAVENOUS | Status: DC

## 2023-11-06 MED ORDER — DEXAMETHASONE SODIUM PHOSPHATE 10 MG/ML IJ SOLN
10.0000 mg | Freq: Once | INTRAMUSCULAR | Status: AC
  Administered 2023-11-06: 10 mg via INTRAVENOUS
  Filled 2023-11-06: qty 1

## 2023-11-06 MED ORDER — FLUOROURACIL CHEMO INJECTION 2.5 GM/50ML
400.0000 mg/m2 | Freq: Once | INTRAVENOUS | Status: AC
  Administered 2023-11-06: 1000 mg via INTRAVENOUS
  Filled 2023-11-06: qty 20

## 2023-11-06 MED ORDER — SODIUM CHLORIDE 0.9 % IV SOLN
2400.0000 mg/m2 | INTRAVENOUS | Status: DC
  Administered 2023-11-06: 6100 mg via INTRAVENOUS
  Filled 2023-11-06: qty 122

## 2023-11-06 MED ORDER — HEPARIN SOD (PORK) LOCK FLUSH 100 UNIT/ML IV SOLN: 500.0000 [IU] | Freq: Once | INTRAVENOUS | Status: DC | PRN

## 2023-11-06 MED ORDER — OXALIPLATIN CHEMO INJECTION 100 MG/20ML
85.0000 mg/m2 | Freq: Once | INTRAVENOUS | Status: AC
  Administered 2023-11-06: 200 mg via INTRAVENOUS
  Filled 2023-11-06: qty 5.45

## 2023-11-06 MED ORDER — SODIUM CHLORIDE 0.9% FLUSH: 10.0000 mL | INTRAVENOUS | Status: DC | PRN

## 2023-11-06 NOTE — Assessment & Plan Note (Addendum)
-  pT4aN1M0 stage IIIB, MMS, G2 -Presented with perforated diverticulitis in sigmoid colon in July, 2024. -Patient underwent laparoscopic resection of sigmoid colon, path reviewed moderate differentiated adenocarcinoma with 1 positive lymph nodes. -Adjuvant chemotherapy with FOLFOX for 6 months was recommended if he can tolerate. -Cycle 1 10/23/2023 - patient reports minimal side effects. Has had mild cold intolerance. States that he is now using a styrofoam cup, drinking and eating foods which are room-temperature, rather than cold. States that cold sensitivity is not intrusive side effect.  -11/06/2023 - presents for Cycle 2 day 1 COLORECTAL FOLFOX - discussed ice bath for hands and feet during treatment to prevent more severe and irreversible neuropathy.

## 2023-11-06 NOTE — Patient Instructions (Signed)
 [facility] - A DEPT OF [facility]  Discharge Instructions: Thank you for choosing [facility] to provide your oncology and hematology care.   If you have a lab appointment with the Cancer Center, please go directly to the Cancer Center and check in at the registration area.   Wear comfortable clothing and clothing appropriate for easy access to any Portacath or PICC line.   We strive to give you quality time with your provider. You may need to reschedule your appointment if you arrive late (15 or more minutes).  Arriving late affects you and other patients whose appointments are after yours.  Also, if you miss three or more appointments without notifying the office, you may be dismissed from the clinic at the provider's discretion.      For prescription refill requests, have your pharmacy contact our office and allow 72 hours for refills to be completed.    Today you received the following chemotherapy and/or immunotherapy agents Oxaliplatin, Leucovorin, Fluorouracil.      To help prevent nausea and vomiting after your treatment, we encourage you to take your nausea medication as directed.  BELOW ARE SYMPTOMS THAT SHOULD BE REPORTED IMMEDIATELY: *FEVER GREATER THAN 100.4 F (38 C) OR HIGHER *CHILLS OR SWEATING *NAUSEA AND VOMITING THAT IS NOT CONTROLLED WITH YOUR NAUSEA MEDICATION *UNUSUAL SHORTNESS OF BREATH *UNUSUAL BRUISING OR BLEEDING *URINARY PROBLEMS (pain or burning when urinating, or frequent urination) *BOWEL PROBLEMS (unusual diarrhea, constipation, pain near the anus) TENDERNESS IN MOUTH AND THROAT WITH OR WITHOUT PRESENCE OF ULCERS (sore throat, sores in mouth, or a toothache) UNUSUAL RASH, SWELLING OR PAIN  UNUSUAL VAGINAL DISCHARGE OR ITCHING   Items with * indicate a potential emergency and should be followed up as soon as possible or go to the Emergency Department if any problems should occur.  Please show the CHEMOTHERAPY  ALERT CARD or IMMUNOTHERAPY ALERT CARD at check-in to the Emergency Department and triage nurse.  Should you have questions after your visit or need to cancel or reschedule your appointment, please contact [facility] - A DEPT OF [facility]  Dept: [phone]  and follow the prompts.  Office hours are 8:00 a.m. to 4:30 p.m. Monday - Friday. Please note that voicemails left after 4:00 p.m. may not be returned until the following business day.  We are closed weekends and major holidays. You have access to a nurse at all times for urgent questions. Please call the main number to the clinic Dept: [phone] and follow the prompts.   For any non-urgent questions, you may also contact your provider using MyChart. We now offer e-Visits for anyone 31 and older to request care online for non-urgent symptoms. For details visit [email or web address].   Also download the MyChart app! Go to the app store, search "MyChart", open the app, select , and log in with your MyChart username and password.

## 2023-11-06 NOTE — Progress Notes (Signed)
Patient Care Team: [name], MD as PCP - General (Family Medicine) Karie Soda, MD as Consulting Physician (General Surgery) Hilarie Fredrickson, MD as Consulting Physician (Gastroenterology) Malachy Mood, MD as Consulting Physician (Medical Oncology) Suanne Marker, MD as Consulting Physician (Neurology) Laren Boom, DO as Consulting Physician (Otolaryngology)  Clinic Day:  11/06/2023  Referring physician: Malachy Mood, MD  ASSESSMENT & PLAN:   Assessment & Plan: Cancer of sigmoid colon - pT4a, pN1a - s/p roboric LAR resection 09/27/2023 -pT4aN1M0 stage IIIB, MMS, G2 -Presented with perforated diverticulitis in sigmoid colon in July, 2024. -Patient underwent laparoscopic resection of sigmoid colon, path reviewed moderate differentiated adenocarcinoma with 1 positive lymph nodes. -Adjuvant chemotherapy with FOLFOX for 6 months was recommended if he can tolerate. -Cycle 1 10/23/2023 - patient reports minimal side effects. Has had mild cold intolerance. States that he is now using a styrofoam cup, drinking and eating foods which are room-temperature, rather than cold. States that cold sensitivity is not intrusive side effect.  -11/06/2023 - presents for Cycle 2 day 1 COLORECTAL FOLFOX - discussed ice bath for hands and feet during treatment to prevent more severe and irreversible neuropathy.    Plan: Labs reviewed  -CBC showing WBC 3.8; Hgb 10.8; Hct 33.8; Plt 298; Anc 2.5 -CMP - K 3.6; glucose 12.1; BUN 21; Creatinine 0.73; eGFR >60; Ca 9.3; LFTs normal.   -most recent ferritin level 10/28/2023 normal at 51.  Proceed with Cycle 2 day 1 COLORECTAL FOLFOX. Discussed ice  baths for hands and feet during chemotherapy treatment. He understands he should notify cancer center if neuropathy becomes bothersome. At that point, may consider dose reduction or change in therapy.  Labs/flush, follow up, and Cycle 3 as scheduled 11/20/2023.   The patient understands the plans discussed today  and is in agreement with them.  He knows to contact our office if he develops concerns prior to his next appointment.  I provided 25 minutes of face-to-face time during this encounter and > 50% was spent counseling as documented under my assessment and plan.    Carlean Jews, NP  [facility] - A DEPT OF [facility] [address] [place] [address] Dept: [phone] Dept Fax: [phone]   No orders of the defined types were placed in this encounter.     CHIEF COMPLAINT:  CC: Cancer of sigmoid colon - pT4a, pN1a  Current Treatment:  COLORECTAL FOLFOX every 14 days   INTERVAL HISTORY:  Kaz is here today for repeat clinical assessment. He reports minimal side effects associated with first round chemotherapy. Has had some cold intolerance, but few other symptoms. He denies fevers or chills. He denies pain. His appetite is good. States that he has been watching his diet. Since initial hospitalization in July, certain foods, especially greasy foods, cause him stomach pain. He denies chest pain, chest pressure, or shortness of breath. He denies headaches or visual disturbances. He denies abdominal pain, nausea, vomiting, or changes in bowel or bladder habits.   His weight has been stable.  I have reviewed the past medical history, past surgical history, social history and family history with the patient and they are unchanged from previous note.  ALLERGIES:  has No Known Allergies.  MEDICATIONS:  Current Outpatient Medications  Medication Sig Dispense Refill   Acetaminophen (TYLENOL PO) Take 500 mg by mouth every 6 (six) hours.     buPROPion (WELLBUTRIN XL) 150 MG 24 hr tablet Take  1 tablet (150 mg total) by mouth every morning. 30 tablet 2   DULoxetine (CYMBALTA) 60 MG capsule Take 1 capsule (60 mg total) by mouth 2 (two) times daily. 60 capsule 2   eszopiclone (LUNESTA) 2 MG TABS tablet Take 1 tablet (2 mg total)  by mouth at bedtime as needed for sleep. Take immediately before bedtime 30 tablet 2   lidocaine-prilocaine (EMLA) cream Apply to affected area once 30 g 3   ondansetron (ZOFRAN-ODT) 8 MG disintegrating tablet Take 1 tablet (8 mg total) by mouth every 8 (eight) hours as needed for nausea or vomiting. (Patient taking differently: Take 8 mg by mouth every 8 (eight) hours as needed for nausea or vomiting. Have not started) 30 tablet 1   prochlorperazine (COMPAZINE) 10 MG tablet Take 1 tablet (10 mg total) by mouth every 6 (six) hours as needed for nausea or vomiting. (Patient taking differently: Take 10 mg by mouth every 6 (six) hours as needed for nausea or vomiting. Have not started) 30 tablet 1   propranolol (INDERAL) 10 MG tablet Take 1 tablet (10 mg total) by mouth 2 (two) times daily as needed. 60 tablet 2   oxyCODONE (OXY IR/ROXICODONE) 5 MG immediate release tablet Take 1 tablet (5 mg total) by mouth every 6 (six) hours as needed for moderate pain, severe pain or breakthrough pain. (Patient not taking: Reported on 11/06/2023) 20 tablet 0   No current facility-administered medications for this visit.   Facility-Administered Medications Ordered in Other Visits  Medication Dose Route Frequency Provider Last Rate Last Admin   dextrose 5 % solution   Intravenous Continuous Malachy Mood, MD   Stopped at 11/06/23 1327   fluorouracil (ADRUCIL) 6,100 mg in sodium chloride 0.9 % 128 mL chemo infusion  2,400 mg/m2 (Treatment Plan Recorded) Intravenous 1 day or 1 dose Malachy Mood, MD   Infusion Verify at 11/06/23 1345   heparin lock flush 100 unit/mL  500 Units Intracatheter Once PRN Malachy Mood, MD        HISTORY OF PRESENT ILLNESS:   Oncology History  Cancer of sigmoid colon - pT4a, pN1a - s/p roboric LAR resection 09/27/2023  09/25/2023 Cancer Staging   Staging form: Colon and Rectum, AJCC 8th Edition - Pathologic stage from 09/25/2023: Stage IIIB (pT4a, pN1a, cM0) - Signed by Carlean Jews, NP on  10/09/2023 Total positive nodes: 1 Histologic grading system: 4 grade system Histologic grade (G): G2 Residual tumor (R): R0 - None   09/25/2023 Surgery   Laparoscopic resection of sigmoid colon  Invasive, moderately differentiated adenocarcinoma of sigmoid colon. Invades into werosal surface. Margins are clear. Negative lymphovascular and perineural invasion. Metastasis to 1/45 lymph nodes.    09/25/2023 Pathology Results   FINAL MICROSCOPIC DIAGNOSIS:  A.   COLON, RESECTION: - Invasive moderately differentiated adenocarcinoma, 7.8 cm involving sigmoid colon - Carcinoma invades into the serosal surface - Resection margins are negative for carcinoma - Negative for lymphovascular or perineural invasion - Metastatic carcinoma to one of forty-five lymph nodes (1/45) - See oncology table B.   COLON, DISTAL MARGIN, EXCISION: - Colonic donut within normal limits, negative for carcinoma  ONCOLOGY TABLE:  COLON AND RECTUM, CARCINOMA:  Resection, Including Transanal Disk Excision of Rectal Neoplasms  Procedure: Resection, rectosigmoid colon Tumor Site: Sigmoid colon Tumor Size: 7.8 cm Macroscopic Tumor Perforation: Not identified  Histologic Type: Adenocarcinoma Histologic Grade: G2: Moderately differentiated Multiple Primary Sites: Not applicable Tumor Extension: Carcinoma invades into the serosal surface Lymphovascular Invasion: Not identified Perineural Invasion:  Not identified Treatment Effect: No known presurgical therapy Margins:      Margin Status for Invasive Carcinoma: All margins negative for invasive carcinoma      Margin Status for Non-Invasive Tumor: All margins negative for high-grade dysplasia / intramucosal           carcinoma and low-grade dysplasia Regional Lymph Nodes:      Number of Lymph Nodes with Tumor: 1      Number of Lymph Nodes Examined: 45 Tumor Deposits: Not identified Distant Metastasis:      Distant Site(s) Involved: Not applicable Pathologic  Stage Classification (pTNM, AJCC 8th Edition): pT4a, pN1a Ancillary Studies: MMR / MSI testing will be ordered. Representative Tumor Block: A2 Comments: None (v4.2.0.1)     09/30/2023 Initial Diagnosis   Cancer of sigmoid colon - pT4a, pN1a - s/p roboric LAR resection 09/27/2023   10/08/2023 Imaging   CT Chest with contrast  IMPRESSION: Negative. No evidence of thoracic metastatic disease or other significant abnormality.   10/23/2023 -  Chemotherapy   Patient is on Treatment Plan : COLORECTAL FOLFOX q14d x 6 months         REVIEW OF SYSTEMS:   Constitutional: Denies fevers, chills or abnormal weight loss Eyes: Denies blurriness of vision Ears, nose, mouth, throat, and face: Denies mucositis or sore throat Respiratory: Denies cough, dyspnea or wheezes Cardiovascular: Denies palpitation, chest discomfort or lower extremity swelling Gastrointestinal:  Denies nausea, heartburn or change in bowel habits Skin: Denies abnormal skin rashes Lymphatics: Denies new lymphadenopathy or easy bruising Neurological:Denies numbness, tingling or new weaknesses Behavioral/Psych: Mood is stable, no new changes. Reports that he does see psychiatrist and therapist routinely.  All other systems were reviewed with the patient and are negative.   VITALS:   Today's Vitals   11/06/23 0917 11/06/23 0918 11/06/23 0920  BP: (!) 138/101 (!) 146/97 120/80  Pulse: 86    Resp: 18    Temp: 98 F (36.7 C)    TempSrc: Temporal    SpO2: 99%    Weight: 277 lb (125.6 kg)    Height: 6\' 2"  (1.88 m)    PainSc:   0-No pain   Body mass index is 35.56 kg/m.   Wt Readings from Last 3 Encounters:  11/06/23 277 lb (125.6 kg)  10/23/23 276 lb 4.8 oz (125.3 kg)  10/17/23 273 lb 4.8 oz (124 kg)    Body mass index is 35.56 kg/m.  Performance status (ECOG): 1 - Symptomatic but completely ambulatory  PHYSICAL EXAM:   GENERAL:alert, no distress and comfortable SKIN: skin color, texture, turgor are  normal, no rashes or significant lesions EYES: normal, Conjunctiva are pink and non-injected, sclera clear OROPHARYNX:no exudate, no erythema and lips, buccal mucosa, and tongue normal  NECK: supple, thyroid normal size, non-tender, without nodularity LYMPH:  no palpable lymphadenopathy in the cervical, axillary or inguinal LUNGS: clear to auscultation and percussion with normal breathing effort HEART: regular rate & rhythm and no murmurs and no lower extremity edema ABDOMEN:abdomen soft, non-tender and normal bowel sounds Musculoskeletal:no cyanosis of digits and no clubbing  NEURO: alert & oriented x 3 with fluent speech, no focal motor/sensory deficits  LABORATORY DATA:  I have reviewed the data as listed    Component Value Date/Time   NA 138 11/06/2023 0821   K 3.6 11/06/2023 0821   CL 105 11/06/2023 0821   CO2 27 11/06/2023 0821   GLUCOSE 121 (H) 11/06/2023 0821   BUN 21 (H) 11/06/2023 0821   CREATININE 0.73  11/06/2023 0821   CALCIUM 9.3 11/06/2023 0821   PROT 7.5 11/06/2023 0821   ALBUMIN 4.3 11/06/2023 0821   AST 10 (L) 11/06/2023 0821   ALT 10 11/06/2023 0821   ALKPHOS 67 11/06/2023 0821   BILITOT 0.3 11/06/2023 0821   GFRNONAA >60 11/06/2023 0821    Lab Results  Component Value Date   WBC 3.8 (L) 11/06/2023   NEUTROABS 2.5 11/06/2023   HGB 10.8 (L) 11/06/2023   HCT 33.8 (L) 11/06/2023   MCV 76.8 (L) 11/06/2023   PLT 298 11/06/2023     RADIOGRAPHIC STUDIES: DG CHEST PORT 1 VIEW  Result Date: 10/17/2023 CLINICAL DATA:  Port-A-Cath in place EXAM: PORTABLE CHEST 1 VIEW COMPARISON:  None Available. FINDINGS: Right chest wall Port-A-Cath with tip overlying the expected area of the mid SVC. The heart size and mediastinal contours are within normal limits. Both lungs are clear. No pleural effusion or pneumothorax. IMPRESSION: Right chest wall Port-A-Cath with tip overlying the expected area of the mid SVC. Electronically Signed   By: Allegra Lai M.D.   On:  10/17/2023 16:29   DG C-Arm 1-60 Min-No Report  Result Date: 10/17/2023 Fluoroscopy was utilized by the requesting physician.  No radiographic interpretation.

## 2023-11-08 ENCOUNTER — Inpatient Hospital Stay: Payer: BC Managed Care – PPO

## 2023-11-08 DIAGNOSIS — Z5111 Encounter for antineoplastic chemotherapy: Secondary | ICD-10-CM | POA: Diagnosis not present

## 2023-11-08 DIAGNOSIS — D6481 Anemia due to antineoplastic chemotherapy: Secondary | ICD-10-CM | POA: Diagnosis not present

## 2023-11-08 DIAGNOSIS — C187 Malignant neoplasm of sigmoid colon: Secondary | ICD-10-CM | POA: Diagnosis not present

## 2023-11-08 DIAGNOSIS — Z79899 Other long term (current) drug therapy: Secondary | ICD-10-CM | POA: Diagnosis not present

## 2023-11-08 DIAGNOSIS — D701 Agranulocytosis secondary to cancer chemotherapy: Secondary | ICD-10-CM | POA: Diagnosis not present

## 2023-11-08 DIAGNOSIS — R110 Nausea: Secondary | ICD-10-CM | POA: Diagnosis not present

## 2023-11-08 DIAGNOSIS — Z452 Encounter for adjustment and management of vascular access device: Secondary | ICD-10-CM | POA: Diagnosis not present

## 2023-11-08 MED ORDER — HEPARIN SOD (PORK) LOCK FLUSH 100 UNIT/ML IV SOLN
500.0000 [IU] | Freq: Once | INTRAVENOUS | Status: AC | PRN
Start: 2023-11-08 — End: 2023-11-08
  Administered 2023-11-08: 500 [IU]

## 2023-11-08 MED ORDER — SODIUM CHLORIDE 0.9% FLUSH
10.0000 mL | INTRAVENOUS | Status: DC | PRN
  Administered 2023-11-08: 10 mL

## 2023-11-18 ENCOUNTER — Encounter: Payer: Self-pay | Admitting: Hematology

## 2023-11-18 NOTE — Progress Notes (Signed)
Per Dr Mosetta Putt, ok to run fluorouracil pump over 44hrs for D1C3

## 2023-11-18 NOTE — Progress Notes (Signed)
Patient called again to inquire about applying for [employer]. Reminded patient he was to bring documentation on 10/28 and he did not bring. Advised what is needed to apply again and he can send to me and complete paperwork at next visit on 11/27.  He verbalized understanding and has my card for any additional questions or concerns.

## 2023-11-19 NOTE — Assessment & Plan Note (Signed)
-  pT4aN1M0 stage IIIB, MMS, G2 -Presented with perforated diverticulitis in sigmoid colon in July, 2024. -Patient underwent laparoscopic resection of sigmoid colon, path reviewed moderate differentiated adenocarcinoma with 1 positive lymph nodes. -I recommend adjuvant chemotherapy FOLFOX for 6 months if he can tolerate.He started on 10/23/2023

## 2023-11-20 ENCOUNTER — Inpatient Hospital Stay: Payer: BC Managed Care – PPO

## 2023-11-20 ENCOUNTER — Encounter: Payer: Self-pay | Admitting: Hematology

## 2023-11-20 ENCOUNTER — Telehealth: Payer: Self-pay | Admitting: Genetic Counselor

## 2023-11-20 ENCOUNTER — Encounter: Payer: Self-pay | Admitting: Genetic Counselor

## 2023-11-20 ENCOUNTER — Inpatient Hospital Stay (HOSPITAL_BASED_OUTPATIENT_CLINIC_OR_DEPARTMENT_OTHER): Payer: BC Managed Care – PPO | Admitting: Hematology

## 2023-11-20 VITALS — BP 142/91 | HR 95 | Resp 18 | Ht 74.0 in | Wt 276.7 lb

## 2023-11-20 DIAGNOSIS — Z95828 Presence of other vascular implants and grafts: Secondary | ICD-10-CM

## 2023-11-20 DIAGNOSIS — Z5111 Encounter for antineoplastic chemotherapy: Secondary | ICD-10-CM | POA: Diagnosis not present

## 2023-11-20 DIAGNOSIS — C187 Malignant neoplasm of sigmoid colon: Secondary | ICD-10-CM | POA: Diagnosis not present

## 2023-11-20 DIAGNOSIS — Z452 Encounter for adjustment and management of vascular access device: Secondary | ICD-10-CM | POA: Diagnosis not present

## 2023-11-20 DIAGNOSIS — D701 Agranulocytosis secondary to cancer chemotherapy: Secondary | ICD-10-CM | POA: Diagnosis not present

## 2023-11-20 DIAGNOSIS — R110 Nausea: Secondary | ICD-10-CM | POA: Diagnosis not present

## 2023-11-20 DIAGNOSIS — D500 Iron deficiency anemia secondary to blood loss (chronic): Secondary | ICD-10-CM

## 2023-11-20 DIAGNOSIS — D6481 Anemia due to antineoplastic chemotherapy: Secondary | ICD-10-CM | POA: Diagnosis not present

## 2023-11-20 DIAGNOSIS — Z1379 Encounter for other screening for genetic and chromosomal anomalies: Secondary | ICD-10-CM | POA: Insufficient documentation

## 2023-11-20 DIAGNOSIS — Z79899 Other long term (current) drug therapy: Secondary | ICD-10-CM | POA: Diagnosis not present

## 2023-11-20 LAB — CBC WITH DIFFERENTIAL (CANCER CENTER ONLY)
Abs Immature Granulocytes: 0 10*3/uL (ref 0.00–0.07)
Basophils Absolute: 0 10*3/uL (ref 0.0–0.1)
Basophils Relative: 1 %
Eosinophils Absolute: 0 10*3/uL (ref 0.0–0.5)
Eosinophils Relative: 1 %
HCT: 35.1 % — ABNORMAL LOW (ref 39.0–52.0)
Hemoglobin: 11.4 g/dL — ABNORMAL LOW (ref 13.0–17.0)
Immature Granulocytes: 0 %
Lymphocytes Relative: 38 %
Lymphs Abs: 1.2 10*3/uL (ref 0.7–4.0)
MCH: 25 pg — ABNORMAL LOW (ref 26.0–34.0)
MCHC: 32.5 g/dL (ref 30.0–36.0)
MCV: 77 fL — ABNORMAL LOW (ref 80.0–100.0)
Monocytes Absolute: 0.4 10*3/uL (ref 0.1–1.0)
Monocytes Relative: 12 %
Neutro Abs: 1.5 10*3/uL — ABNORMAL LOW (ref 1.7–7.7)
Neutrophils Relative %: 48 %
Platelet Count: 219 10*3/uL (ref 150–400)
RBC: 4.56 MIL/uL (ref 4.22–5.81)
RDW: 16.5 % — ABNORMAL HIGH (ref 11.5–15.5)
WBC Count: 3.2 10*3/uL — ABNORMAL LOW (ref 4.0–10.5)
nRBC: 0 % (ref 0.0–0.2)

## 2023-11-20 LAB — CMP (CANCER CENTER ONLY)
ALT: 12 U/L (ref 0–44)
AST: 12 U/L — ABNORMAL LOW (ref 15–41)
Albumin: 4.4 g/dL (ref 3.5–5.0)
Alkaline Phosphatase: 64 U/L (ref 38–126)
Anion gap: 5 (ref 5–15)
BUN: 14 mg/dL (ref 6–20)
CO2: 29 mmol/L (ref 22–32)
Calcium: 9.4 mg/dL (ref 8.9–10.3)
Chloride: 104 mmol/L (ref 98–111)
Creatinine: 0.9 mg/dL (ref 0.61–1.24)
GFR, Estimated: >60 mL/min (ref >60)
Glucose, Bld: 97 mg/dL (ref 70–99)
Potassium: 3.9 mmol/L (ref 3.5–5.1)
Sodium: 138 mmol/L (ref 135–145)
Total Bilirubin: 0.6 mg/dL (ref <1.2)
Total Protein: 7.7 g/dL (ref 6.5–8.1)

## 2023-11-20 LAB — FERRITIN: Ferritin: 15 ng/mL — ABNORMAL LOW (ref 24–336)

## 2023-11-20 MED ORDER — OXALIPLATIN CHEMO INJECTION 100 MG/20ML
85.0000 mg/m2 | Freq: Once | INTRAVENOUS | Status: AC
  Administered 2023-11-20: 200 mg via INTRAVENOUS
  Filled 2023-11-20: qty 6.2

## 2023-11-20 MED ORDER — FLUOROURACIL CHEMO INJECTION 2.5 GM/50ML
400.0000 mg/m2 | Freq: Once | INTRAVENOUS | Status: AC
  Administered 2023-11-20: 1000 mg via INTRAVENOUS
  Filled 2023-11-20: qty 20

## 2023-11-20 MED ORDER — SODIUM CHLORIDE 0.9% FLUSH
10.0000 mL | INTRAVENOUS | Status: DC | PRN
  Administered 2023-11-20: 10 mL

## 2023-11-20 MED ORDER — LEUCOVORIN CALCIUM INJECTION 350 MG
400.0000 mg/m2 | Freq: Once | INTRAVENOUS | Status: AC
  Administered 2023-11-20: 1020 mg via INTRAVENOUS
  Filled 2023-11-20: qty 50

## 2023-11-20 MED ORDER — HEPARIN SOD (PORK) LOCK FLUSH 100 UNIT/ML IV SOLN
500.0000 [IU] | Freq: Once | INTRAVENOUS | Status: DC | PRN
Start: 2023-11-20 — End: 2023-11-20

## 2023-11-20 MED ORDER — PALONOSETRON HCL INJECTION 0.25 MG/5ML
0.2500 mg | Freq: Once | INTRAVENOUS | Status: AC
Start: 2023-11-20 — End: 2023-11-20
  Administered 2023-11-20: 0.25 mg via INTRAVENOUS
  Filled 2023-11-20: qty 5

## 2023-11-20 MED ORDER — SODIUM CHLORIDE 0.9% FLUSH: 10.0000 mL | INTRAVENOUS | Status: DC | PRN

## 2023-11-20 MED ORDER — DEXTROSE 5 % IV SOLN: INTRAVENOUS | Status: DC

## 2023-11-20 MED ORDER — DEXAMETHASONE SODIUM PHOSPHATE 10 MG/ML IJ SOLN
10.0000 mg | Freq: Once | INTRAMUSCULAR | Status: AC
  Administered 2023-11-20: 10 mg via INTRAVENOUS
  Filled 2023-11-20: qty 1

## 2023-11-20 MED ORDER — SODIUM CHLORIDE 0.9 % IV SOLN
2400.0000 mg/m2 | INTRAVENOUS | Status: DC
  Administered 2023-11-20: 6100 mg via INTRAVENOUS
  Filled 2023-11-20: qty 122

## 2023-11-20 NOTE — Patient Instructions (Signed)
[facility] - A DEPT OF [facility]  Discharge Instructions: Thank you for choosing [facility] to provide your oncology and hematology care.   If you have a lab appointment with the Cancer Center, please go directly to the Cancer Center and check in at the registration area.   Wear comfortable clothing and clothing appropriate for easy access to any Portacath or PICC line.   We strive to give you quality time with your provider. You may need to reschedule your appointment if you arrive late (15 or more minutes).  Arriving late affects you and other patients whose appointments are after yours.  Also, if you miss three or more appointments without notifying the office, you may be dismissed from the clinic at the provider's discretion.      For prescription refill requests, have your pharmacy contact our office and allow 72 hours for refills to be completed.    Today you received the following chemotherapy and/or immunotherapy agents Oxaliplatin, Leucovorin, Fluorouracil.      To help prevent nausea and vomiting after your treatment, we encourage you to take your nausea medication as directed.  BELOW ARE SYMPTOMS THAT SHOULD BE REPORTED IMMEDIATELY: *FEVER GREATER THAN 100.4 F (38 C) OR HIGHER *CHILLS OR SWEATING *NAUSEA AND VOMITING THAT IS NOT CONTROLLED WITH YOUR NAUSEA MEDICATION *UNUSUAL SHORTNESS OF BREATH *UNUSUAL BRUISING OR BLEEDING *URINARY PROBLEMS (pain or burning when urinating, or frequent urination) *BOWEL PROBLEMS (unusual diarrhea, constipation, pain near the anus) TENDERNESS IN MOUTH AND THROAT WITH OR WITHOUT PRESENCE OF ULCERS (sore throat, sores in mouth, or a toothache) UNUSUAL RASH, SWELLING OR PAIN  UNUSUAL VAGINAL DISCHARGE OR ITCHING   Items with * indicate a potential emergency and should be followed up as soon as possible or go to the Emergency Department if any problems should occur.  Please show the CHEMOTHERAPY  ALERT CARD or IMMUNOTHERAPY ALERT CARD at check-in to the Emergency Department and triage nurse.  Should you have questions after your visit or need to cancel or reschedule your appointment, please contact [facility] - A DEPT OF [facility]  Dept: [phone]  and follow the prompts.  Office hours are 8:00 a.m. to 4:30 p.m. Monday - Friday. Please note that voicemails left after 4:00 p.m. may not be returned until the following business day.  We are closed weekends and major holidays. You have access to a nurse at all times for urgent questions. Please call the main number to the clinic Dept: [phone] and follow the prompts.   For any non-urgent questions, you may also contact your provider using MyChart. We now offer e-Visits for anyone 86 and older to request care online for non-urgent symptoms. For details visit [email or web address].   Also download the MyChart app! Go to the app store, search "MyChart", open the app, select [place], and log in with your MyChart username and password.

## 2023-11-20 NOTE — Telephone Encounter (Addendum)
I contacted Mr. Orms to discuss his genetic testing results. No pathogenic variants were identified in the 71 genes analyzed. Detailed clinic note to follow.  The test report has been scanned into EPIC and is located under the Molecular Pathology section of the Results Review tab.  A portion of the result report is included below for reference.   Lalla Brothers, MS, [facility] Genetic Counselor [email or web address] (P) [phone]

## 2023-11-20 NOTE — Progress Notes (Signed)
[facility]   Telephone:([phone] Fax:([phone]   Clinic Follow up Note   Patient Care Team: [name], MD as PCP - General (Family Medicine) Karie Soda, MD as Consulting Physician (General Surgery) Hilarie Fredrickson, MD as Consulting Physician (Gastroenterology) Malachy Mood, MD as Consulting Physician (Medical Oncology) Suanne Marker, MD as Consulting Physician (Neurology) Laren Boom, DO as Consulting Physician (Otolaryngology)  Date of Service:  11/20/2023  CHIEF COMPLAINT: f/u of colon cancer  CURRENT THERAPY:  Adjuvant FOLFOX  Oncology History   Cancer of sigmoid colon - pT4a, pN1a - s/p roboric LAR resection 09/27/2023 -pT4aN1M0 stage IIIB, MMS, G2 -Presented with perforated diverticulitis in sigmoid colon in July, 2024. -Patient underwent laparoscopic resection of sigmoid colon, path reviewed moderate differentiated adenocarcinoma with 1 positive lymph nodes. -I recommend adjuvant chemotherapy FOLFOX for 6 months if he can tolerate.He started on 10/23/2023   Assessment and Plan    Colon Cancer Undergoing adjuvant chemotherapy, currently on cycle three. Reports manageable nausea, improving cold sensitivity, and persistent fatigue. Weight stable, no fever, chills, numbness, or tingling. Hemoglobin levels slightly low but not requiring IV iron. White blood cell counts slightly lower due to chemotherapy. Kidney and liver function tests pending. Discussed cumulative side effects, importance of staying active and eating well. Agreed to adjust chemotherapy schedule around holidays. - Schedule next cycle for December 11 - Postpone Christmas week cycle by one week - Request insurance approval for white blood cell count injection, to be administered on day three if needed - Monitor blood counts and kidney/liver function - Schedule additional appointments after January 2 - Advise to stay active, eat well, and monitor for fever, chills,  dizziness, or excessive diarrhea  Chemotherapy-Induced Nausea Nausea following chemotherapy, managed with medication, subsides after a few days. - Continue current anti-nausea medication   Chemotherapy-Induced Anemia Hemoglobin levels slightly low but not requiring IV iron, anemia likely due to chemotherapy. - Monitor hemoglobin levels  Chemotherapy-Induced Leukopenia White blood cell counts slightly lower due to chemotherapy. Injection may be needed in future cycles. - Request insurance approval for white blood cell count injection, to be administered on day three if needed - Monitor white blood cell counts  Follow-up -Lab reviewed, adequate for treatment, will proceed cycle 3 chemo today at same dose - schedule next cycle for December 11 - Postpone Christmas week cycle by one week - Move New Year's week cycle to January 2 - Schedule additional appointments after January 2.         SUMMARY OF ONCOLOGIC HISTORY: Oncology History  Cancer of sigmoid colon - pT4a, pN1a - s/p roboric LAR resection 09/27/2023  09/25/2023 Cancer Staging   Staging form: Colon and Rectum, AJCC 8th Edition - Pathologic stage from 09/25/2023: Stage IIIB (pT4a, pN1a, cM0) - Signed by Carlean Jews, NP on 10/09/2023 Total positive nodes: 1 Histologic grading system: 4 grade system Histologic grade (G): G2 Residual tumor (R): R0 - None   09/25/2023 Surgery   Laparoscopic resection of sigmoid colon  Invasive, moderately differentiated adenocarcinoma of sigmoid colon. Invades into werosal surface. Margins are clear. Negative lymphovascular and perineural invasion. Metastasis to 1/45 lymph nodes.    09/25/2023 Pathology Results   FINAL MICROSCOPIC DIAGNOSIS:  A.   COLON, RESECTION: - Invasive moderately differentiated adenocarcinoma, 7.8 cm involving sigmoid colon - Carcinoma invades into the serosal surface - Resection margins are negative for carcinoma - Negative for lymphovascular or perineural  invasion - Metastatic carcinoma to one of forty-five lymph nodes (  1/45) - See oncology table B.   COLON, DISTAL MARGIN, EXCISION: - Colonic donut within normal limits, negative for carcinoma  ONCOLOGY TABLE:  COLON AND RECTUM, CARCINOMA:  Resection, Including Transanal Disk Excision of Rectal Neoplasms  Procedure: Resection, rectosigmoid colon Tumor Site: Sigmoid colon Tumor Size: 7.8 cm Macroscopic Tumor Perforation: Not identified  Histologic Type: Adenocarcinoma Histologic Grade: G2: Moderately differentiated Multiple Primary Sites: Not applicable Tumor Extension: Carcinoma invades into the serosal surface Lymphovascular Invasion: Not identified Perineural Invasion: Not identified Treatment Effect: No known presurgical therapy Margins:      Margin Status for Invasive Carcinoma: All margins negative for invasive carcinoma      Margin Status for Non-Invasive Tumor: All margins negative for high-grade dysplasia / intramucosal           carcinoma and low-grade dysplasia Regional Lymph Nodes:      Number of Lymph Nodes with Tumor: 1      Number of Lymph Nodes Examined: 45 Tumor Deposits: Not identified Distant Metastasis:      Distant Site(s) Involved: Not applicable Pathologic Stage Classification (pTNM, AJCC 8th Edition): pT4a, pN1a Ancillary Studies: MMR / MSI testing will be ordered. Representative Tumor Block: A2 Comments: None (v4.2.0.1)     09/30/2023 Initial Diagnosis   Cancer of sigmoid colon - pT4a, pN1a - s/p roboric LAR resection 09/27/2023   10/08/2023 Imaging   CT Chest with contrast  IMPRESSION: Negative. No evidence of thoracic metastatic disease or other significant abnormality.   10/23/2023 -  Chemotherapy   Patient is on Treatment Plan : COLORECTAL FOLFOX q14d x 6 months        Discussed the use of AI scribe software for clinical note transcription with the patient, who gave verbal consent to proceed.  History of Present Illness   A  42 year old patient with a history of colon cancer, currently on adjuvant chemotherapy, presents for a follow-up visit. The patient reports experiencing nausea following each chemotherapy cycle, which resolves after a few days with the aid of medication. The patient also experiences cold sensitivity, particularly in the first few days following chemotherapy, which prompts him to consume beverages at room temperature to avoid further irritation. Fatigue is also a significant symptom, which does not fully resolve between chemotherapy cycles. The patient has been maintaining his weight and has not experienced any numbness or tingling. There have been no episodes of fever or chills. The patient's hemoglobin levels were noted to be slightly low during the last visit, but the patient is not currently on iron supplements.         All other systems were reviewed with the patient and are negative.  MEDICAL HISTORY:  Past Medical History:  Diagnosis Date   ADHD (attention deficit hyperactivity disorder)    Annual physical exam 09/09/2019   Anxiety and depression    Cancer (HCC)    Colon cancer (HCC)    COVID-19    12/28/19   Depression, recurrent (HCC) 09/09/2019   Headache 12/31/2021   History of kidney stones    Hypertriglyceridemia 07/17/2021   Insomnia    Inverted nipple 09/16/2019   Kidney stone    Mastoiditis of both sides    Nystagmus 01/22/2022   Obesity (BMI 30-39.9)    Obesity (BMI 30-39.9)    Suicidal ideation 07/14/2021   Vertigo     SURGICAL HISTORY: Past Surgical History:  Procedure Laterality Date   COLON RESECTION  09/25/2023   PORTACATH PLACEMENT Right 10/17/2023   Procedure: INSERTION PORT-A-CATH WITH ULRASOUND GUIDANCE;  Surgeon: Karie Soda, MD;  Location: [facility];  Service: General;  Laterality: Right;   PROCTOSCOPY N/A 09/25/2023   Procedure: RIGID PROCTOSCOPY;  Surgeon: Karie Soda, MD;  Location: [facility];  Service: General;  Laterality: N/A;     I have reviewed the social history and family history with the patient and they are unchanged from previous note.  ALLERGIES:  has No Known Allergies.  MEDICATIONS:  Current Outpatient Medications  Medication Sig Dispense Refill   Acetaminophen (TYLENOL PO) Take 500 mg by mouth every 6 (six) hours.     buPROPion (WELLBUTRIN XL) 150 MG 24 hr tablet Take 1 tablet (150 mg total) by mouth every morning. 30 tablet 2   DULoxetine (CYMBALTA) 60 MG capsule Take 1 capsule (60 mg total) by mouth 2 (two) times daily. 60 capsule 2   eszopiclone (LUNESTA) 2 MG TABS tablet Take 1 tablet (2 mg total) by mouth at bedtime as needed for sleep. Take immediately before bedtime 30 tablet 2   lidocaine-prilocaine (EMLA) cream Apply to affected area once 30 g 3   ondansetron (ZOFRAN-ODT) 8 MG disintegrating tablet Take 1 tablet (8 mg total) by mouth every 8 (eight) hours as needed for nausea or vomiting. (Patient taking differently: Take 8 mg by mouth every 8 (eight) hours as needed for nausea or vomiting. Have not started) 30 tablet 1   oxyCODONE (OXY IR/ROXICODONE) 5 MG immediate release tablet Take 1 tablet (5 mg total) by mouth every 6 (six) hours as needed for moderate pain, severe pain or breakthrough pain. (Patient not taking: Reported on 11/06/2023) 20 tablet 0   prochlorperazine (COMPAZINE) 10 MG tablet Take 1 tablet (10 mg total) by mouth every 6 (six) hours as needed for nausea or vomiting. (Patient taking differently: Take 10 mg by mouth every 6 (six) hours as needed for nausea or vomiting. Have not started) 30 tablet 1   propranolol (INDERAL) 10 MG tablet Take 1 tablet (10 mg total) by mouth 2 (two) times daily as needed. 60 tablet 2   No current facility-administered medications for this visit.    PHYSICAL EXAMINATION: ECOG PERFORMANCE STATUS: 1 - Symptomatic but completely ambulatory  Vitals:   11/20/23 1033  BP: (!) 142/91  Pulse: 95  Resp: 18  SpO2: 99%   Wt Readings from Last 3  Encounters:  11/20/23 276 lb 11.2 oz (125.5 kg)  11/06/23 277 lb (125.6 kg)  10/23/23 276 lb 4.8 oz (125.3 kg)     GENERAL:alert, no distress and comfortable SKIN: skin color, texture, turgor are normal, no rashes or significant lesions EYES: normal, Conjunctiva are pink and non-injected, sclera clear NECK: supple, thyroid normal size, non-tender, without nodularity LYMPH:  no palpable lymphadenopathy in the cervical, axillary  LUNGS: clear to auscultation and percussion with normal breathing effort HEART: regular rate & rhythm and no murmurs and no lower extremity edema ABDOMEN:abdomen soft, non-tender and normal bowel sounds Musculoskeletal:no cyanosis of digits and no clubbing  NEURO: alert & oriented x 3 with fluent speech, no focal motor/sensory deficits    LABORATORY DATA:  I have reviewed the data as listed    Latest Ref Rng & Units 11/20/2023   10:10 AM 11/06/2023    8:21 AM 10/23/2023    9:01 AM  CBC  WBC 4.0 - 10.5 K/uL 3.2  3.8  6.0   Hemoglobin 13.0 - 17.0 g/dL 72.5  36.6  44.0   Hematocrit 39.0 - 52.0 % 35.1  33.8  34.8   Platelets 150 -  400 K/uL 219  298  305         Latest Ref Rng & Units 11/20/2023   10:10 AM 11/06/2023    8:21 AM 10/23/2023    9:01 AM  CMP  Glucose 70 - 99 mg/dL 97  098  119   BUN 6 - 20 mg/dL 14  21  12    Creatinine 0.61 - 1.24 mg/dL 1.47  8.29  5.62   Sodium 135 - 145 mmol/L 138  138  138   Potassium 3.5 - 5.1 mmol/L 3.9  3.6  3.8   Chloride 98 - 111 mmol/L 104  105  105   CO2 22 - 32 mmol/L 29  27  27    Calcium 8.9 - 10.3 mg/dL 9.4  9.3  9.4   Total Protein 6.5 - 8.1 g/dL 7.7  7.5  7.6   Total Bilirubin <1.2 mg/dL 0.6  0.3  0.3   Alkaline Phos 38 - 126 U/L 64  67  63   AST 15 - 41 U/L 12  10  12    ALT 0 - 44 U/L 12  10  13        RADIOGRAPHIC STUDIES: I have personally reviewed the radiological images as listed and agreed with the findings in the report. No results found.    Orders Placed This Encounter  Procedures    CBC with Differential (Cancer Center Only)    Standing Status:   Future    Standing Expiration Date:   12/25/2024   CMP (Cancer Center only)    Standing Status:   Future    Standing Expiration Date:   12/25/2024   CBC with Differential (Cancer Center Only)    Standing Status:   Future    Standing Expiration Date:   01/08/2025   CMP (Cancer Center only)    Standing Status:   Future    Standing Expiration Date:   01/08/2025   CBC with Differential (Cancer Center Only)    Standing Status:   Future    Standing Expiration Date:   01/22/2025   CMP (Cancer Center only)    Standing Status:   Future    Standing Expiration Date:   01/22/2025   CBC with Differential (Cancer Center Only)    Standing Status:   Future    Standing Expiration Date:   02/05/2025   CMP (Cancer Center only)    Standing Status:   Future    Standing Expiration Date:   02/05/2025   All questions were answered. The patient knows to call the clinic with any problems, questions or concerns. No barriers to learning was detected. The total time spent in the appointment was 25 minutes.     Malachy Mood, MD 11/20/2023

## 2023-11-20 NOTE — Progress Notes (Signed)
Patient provided documentation for grant and signed paperwork at check-in today. Patient approved for one-time $1000 [employer] grant to assist with personal expenses while going through treatment. He was given a copy of the approval letter and expense sheet in green folder to review as well as advised to contact me at earliest convenience to discuss.  He has my card to do so and for any additional financial questions or concerns.

## 2023-11-22 ENCOUNTER — Inpatient Hospital Stay: Payer: BC Managed Care – PPO

## 2023-11-22 ENCOUNTER — Encounter: Payer: Self-pay | Admitting: Genetic Counselor

## 2023-11-22 ENCOUNTER — Ambulatory Visit: Payer: Self-pay | Admitting: Genetic Counselor

## 2023-11-22 DIAGNOSIS — Z5111 Encounter for antineoplastic chemotherapy: Secondary | ICD-10-CM | POA: Diagnosis not present

## 2023-11-22 DIAGNOSIS — Z452 Encounter for adjustment and management of vascular access device: Secondary | ICD-10-CM | POA: Diagnosis not present

## 2023-11-22 DIAGNOSIS — Z79899 Other long term (current) drug therapy: Secondary | ICD-10-CM | POA: Diagnosis not present

## 2023-11-22 DIAGNOSIS — R110 Nausea: Secondary | ICD-10-CM | POA: Diagnosis not present

## 2023-11-22 DIAGNOSIS — D6481 Anemia due to antineoplastic chemotherapy: Secondary | ICD-10-CM | POA: Diagnosis not present

## 2023-11-22 DIAGNOSIS — Z1379 Encounter for other screening for genetic and chromosomal anomalies: Secondary | ICD-10-CM

## 2023-11-22 DIAGNOSIS — C187 Malignant neoplasm of sigmoid colon: Secondary | ICD-10-CM

## 2023-11-22 DIAGNOSIS — D701 Agranulocytosis secondary to cancer chemotherapy: Secondary | ICD-10-CM | POA: Diagnosis not present

## 2023-11-22 MED ORDER — SODIUM CHLORIDE 0.9% FLUSH
10.0000 mL | INTRAVENOUS | Status: DC | PRN
  Administered 2023-11-22: 10 mL

## 2023-11-22 MED ORDER — HEPARIN SOD (PORK) LOCK FLUSH 100 UNIT/ML IV SOLN
500.0000 [IU] | Freq: Once | INTRAVENOUS | Status: AC | PRN
Start: 2023-11-22 — End: 2023-11-22
  Administered 2023-11-22: 500 [IU]

## 2023-11-22 NOTE — Progress Notes (Signed)
HPI:   Mr. Rothmeyer was previously seen in the [facility] due to a personal and family history of cancer and concerns regarding a hereditary predisposition to cancer. Please refer to our prior cancer genetics clinic note for more information regarding our discussion, assessment and recommendations, at the time. Mr. Buonocore recent genetic test results were disclosed to him, as were recommendations warranted by these results. These results and recommendations are discussed in more detail below.  CANCER HISTORY:  Oncology History  Cancer of sigmoid colon - pT4a, pN1a - s/p roboric LAR resection 09/27/2023  09/25/2023 Cancer Staging   Staging form: Colon and Rectum, AJCC 8th Edition - Pathologic stage from 09/25/2023: Stage IIIB (pT4a, pN1a, cM0) - Signed by Carlean Jews, NP on 10/09/2023 Total positive nodes: 1 Histologic grading system: 4 grade system Histologic grade (G): G2 Residual tumor (R): R0 - None   09/25/2023 Surgery   Laparoscopic resection of sigmoid colon  Invasive, moderately differentiated adenocarcinoma of sigmoid colon. Invades into werosal surface. Margins are clear. Negative lymphovascular and perineural invasion. Metastasis to 1/45 lymph nodes.    09/25/2023 Pathology Results   FINAL MICROSCOPIC DIAGNOSIS:  A.   COLON, RESECTION: - Invasive moderately differentiated adenocarcinoma, 7.8 cm involving sigmoid colon - Carcinoma invades into the serosal surface - Resection margins are negative for carcinoma - Negative for lymphovascular or perineural invasion - Metastatic carcinoma to one of forty-five lymph nodes (1/45) - See oncology table B.   COLON, DISTAL MARGIN, EXCISION: - Colonic donut within normal limits, negative for carcinoma  ONCOLOGY TABLE:  COLON AND RECTUM, CARCINOMA:  Resection, Including Transanal Disk Excision of Rectal Neoplasms  Procedure: Resection, rectosigmoid colon Tumor Site: Sigmoid colon Tumor Size: 7.8  cm Macroscopic Tumor Perforation: Not identified  Histologic Type: Adenocarcinoma Histologic Grade: G2: Moderately differentiated Multiple Primary Sites: Not applicable Tumor Extension: Carcinoma invades into the serosal surface Lymphovascular Invasion: Not identified Perineural Invasion: Not identified Treatment Effect: No known presurgical therapy Margins:      Margin Status for Invasive Carcinoma: All margins negative for invasive carcinoma      Margin Status for Non-Invasive Tumor: All margins negative for high-grade dysplasia / intramucosal           carcinoma and low-grade dysplasia Regional Lymph Nodes:      Number of Lymph Nodes with Tumor: 1      Number of Lymph Nodes Examined: 45 Tumor Deposits: Not identified Distant Metastasis:      Distant Site(s) Involved: Not applicable Pathologic Stage Classification (pTNM, AJCC 8th Edition): pT4a, pN1a Ancillary Studies: MMR / MSI testing will be ordered. Representative Tumor Block: A2 Comments: None (v4.2.0.1)     09/30/2023 Initial Diagnosis   Cancer of sigmoid colon - pT4a, pN1a - s/p roboric LAR resection 09/27/2023   10/08/2023 Imaging   CT Chest with contrast  IMPRESSION: Negative. No evidence of thoracic metastatic disease or other significant abnormality.   10/23/2023 -  Chemotherapy   Patient is on Treatment Plan : COLORECTAL FOLFOX q14d x 6 months      Genetic Testing   [facility] CancerNext-Expanded Panel+RNA was Negative. Report date is 11/15/2023.   The CancerNext-Expanded gene panel offered by [facility] and includes sequencing, rearrangement, and RNA analysis for the following 71 genes: AIP, ALK, APC, ATM, AXIN2, BAP1, BARD1, BMPR1A, BRCA1, BRCA2, BRIP1, CDC73, CDH1, CDK4, CDKN1B, CDKN2A, CHEK2, CTNNA1, DICER1, FH, FLCN, KIF1B, LZTR1, MAX, MEN1, MET, MLH1, MSH2, MSH3, MSH6, MUTYH, NF1, NF2, NTHL1, PALB2, PHOX2B, PMS2, POT1, PRKAR1A, PTCH1,  PTEN, RAD51C, RAD51D, RB1, RET, SDHA, SDHAF2, SDHB, SDHC, SDHD, SMAD4,  SMARCA4, SMARCB1, SMARCE1, STK11, SUFU, TMEM127, TP53, TSC1, TSC2, and VHL (sequencing and deletion/duplication); EGFR, EGLN1, HOXB13, KIT, MITF, PDGFRA, POLD1, and POLE (sequencing only); EPCAM and GREM1 (deletion/duplication only).       FAMILY HISTORY:  We obtained a detailed, 4-generation family history.  Significant diagnoses are listed below:      Family History  Problem Relation Age of Onset   Diabetes Mother     Hypertension Mother     Hyperlipidemia Mother     Diabetes Mellitus II Mother     Anxiety disorder Mother     Depression Mother     Basal cell carcinoma Mother 87 - 7   Stroke Father     Alcoholism Father     Anxiety disorder Brother     Depression Brother     Hypertension Brother     Hyperlipidemia Brother     Diabetes type II Brother     ADD / ADHD Brother     Squamous cell carcinoma Maternal Grandmother     Dementia Maternal Grandfather     Basal cell carcinoma Maternal Grandfather     Prostate cancer Maternal Grandfather 66   Lung cancer Paternal Grandfather 36        smoked   Stomach cancer Neg Hx     Colon cancer Neg Hx     Esophageal cancer Neg Hx     Pancreatic cancer Neg Hx             Mr. Sorich is unaware of previous family history of genetic testing for hereditary cancer risks. There is no reported Ashkenazi Jewish ancestry.   GENETIC TEST RESULTS:  The [facility] CancerNext-Expanded Panel found no pathogenic mutations.   The CancerNext-Expanded gene panel offered by [facility] and includes sequencing, rearrangement, and RNA analysis for the following 71 genes: AIP, ALK, APC, ATM, AXIN2, BAP1, BARD1, BMPR1A, BRCA1, BRCA2, BRIP1, CDC73, CDH1, CDK4, CDKN1B, CDKN2A, CHEK2, CTNNA1, DICER1, FH, FLCN, KIF1B, LZTR1, MAX, MEN1, MET, MLH1, MSH2, MSH3, MSH6, MUTYH, NF1, NF2, NTHL1, PALB2, PHOX2B, PMS2, POT1, PRKAR1A, PTCH1, PTEN, RAD51C, RAD51D, RB1, RET, SDHA, SDHAF2, SDHB, SDHC, SDHD, SMAD4, SMARCA4, SMARCB1, SMARCE1, STK11, SUFU, TMEM127, TP53, TSC1,  TSC2, and VHL (sequencing and deletion/duplication); EGFR, EGLN1, HOXB13, KIT, MITF, PDGFRA, POLD1, and POLE (sequencing only); EPCAM and GREM1 (deletion/duplication only).   The test report has been scanned into EPIC and is located under the Molecular Pathology section of the Results Review tab.  A portion of the result report is included below for reference. Genetic testing reported out on 11/15/2023.       Even though a pathogenic variant was not identified, possible explanations for his personal history of cancer may include: There may be no hereditary risk for cancer in the family. The cancers in Mr. Hearn and/or his family may be due to other genetic or environmental factors. There may be a gene mutation in one of these genes that current testing methods cannot detect, but that chance is small. There could be another gene that has not yet been discovered, or that we have not yet tested, that is responsible for the cancer diagnoses in the family.   Therefore, it is important to remain in touch with cancer genetics in the future so that we can continue to offer Mr. Simpkins the most up to date genetic testing.   ADDITIONAL GENETIC TESTING:  We discussed with Mr. Blakeman that his genetic testing was fairly extensive.  If there are genes identified to increase cancer risk that can be analyzed in the future, we would be happy to discuss and coordinate this testing at that time.    CANCER SCREENING RECOMMENDATIONS:  Mr. Pride test result is considered negative (normal).  This means that we have not identified a hereditary cause for his personal and family history of cancer at this time. Most cancers happen by chance and this negative test suggests that his cancer may fall into this category.    An individual's cancer risk and medical management are not determined by genetic test results alone. Overall cancer risk assessment incorporates additional factors, including personal medical history,  family history, and any available genetic information that may result in a personalized plan for cancer prevention and surveillance. Therefore, it is recommended he continue to follow the cancer management and screening guidelines provided by his oncology and primary healthcare provider.  Based on the reported personal and family history, specific cancer screenings for Mr. Rett Singleton II Mcquary and his family include:  Colon Cancer Screening: Due to Mr. Bursey history of colon cancer, his first degree relatives are all recommended to begin colonoscopies at age 66 (10 years prior to Mr. Lassiter age at diagnosis) and repeat every 5 years. More frequent colonoscopies may be recommended if polyps are identified.  FOLLOW-UP:  Cancer genetics is a rapidly advancing field and it is possible that new genetic tests will be appropriate for him and/or his family members in the future. We encouraged him to remain in contact with cancer genetics on an annual basis so we can update his personal and family histories and let him know of advances in cancer genetics that may benefit this family.   Our contact number was provided. Mr. Hodgkinson questions were answered to his satisfaction, and he knows he is welcome to call us at anytime with additional questions or concerns.   Lalla Brothers, MS, [facility] Genetic Counselor [email or web address] (P) [phone]

## 2023-11-23 DIAGNOSIS — C187 Malignant neoplasm of sigmoid colon: Secondary | ICD-10-CM | POA: Diagnosis not present

## 2023-11-25 ENCOUNTER — Encounter: Payer: Self-pay | Admitting: Hematology

## 2023-11-25 ENCOUNTER — Telehealth: Payer: Self-pay

## 2023-11-25 NOTE — Telephone Encounter (Addendum)
Called patient to relay message below as per Dr. Mosetta Putt. Patient voiced full understanding.  ----- Message from Malachy Mood sent at 11/25/2023  7:13 AM EST ----- PLEASE let pt know his iron is slightly low and please take OTC iron once daily, thx  Malachy Mood

## 2023-11-25 NOTE — Progress Notes (Signed)
Returned call to patient from voicemail per my request.  Patient was approved for one-time $1000 [employer] and called to discuss expense sheet in detail. Went over expenses and how each are covered, what is needed and how to submit and processing time. He verbalized understanding.  He has my card for any additional financial questions or concerns.

## 2023-11-27 NOTE — Progress Notes (Signed)
BH MD/PA/NP OP Progress Note  11/29/2023 8:30 AM Cory Gomez Gomez  MRN:  [id number]  Visit Diagnosis:    ICD-10-CM   1. Major depressive disorder, recurrent episode, mild (HCC)  F33.0 buPROPion (WELLBUTRIN XL) 150 MG 24 hr tablet    2. GAD (generalized anxiety disorder)  F41.1       [name] Gomez is a 42 y.o. male with a history of MDD, GAD, ADHD, binge eating disorder, colon cancer stage 3b in treatment, and Vit D deficiency who presented to [facility] at [facility] for initial evaluation on 01/15/2023.  At initial evaluation patient reported neurovegetative symptoms of depression including low mood, anhedonia, amotivation, worthlessness, insomnia, increased appetite, and occasional passive SI.  He denied any intent or plan.  Patient was able to complete a safety plan and lists his mother as a good support.  Patient furthermore endorsed symptoms of anxiety including feeling nervous or on edge, being unable to stop or control his worrying, difficulty relaxing, increased irritability, restlessness, and fears that something awful might happen.  Psychosocially patient has increased stress from financial struggles, difficulty at work, and interpersonal struggles in his marriage.  Patient meets criteria for MDD and generalized anxiety disorder with further clarity needed to rule out ADHD.  He was referred for neuropsych testing.  Of note due to patient's sleep troubles and body habitus there is some concern for sleep apnea and he was referred to a sleep specialist.  Cory Gomez Gomez presents for follow-up evaluation. Today, 11/29/23, patient reports that his anxiety has improved with the increase in Cymbalta and initiation of propranolol.  He denies any adverse side effects from the medication.  He does have some residual anxiety is more manageable and he does not believe that it will fully resolve until his cancer is in remission.  Patient will continue  with therapy and has joined a cancer support group.  We will continue on his current regimen and follow-up in a month  Plan: - Continue Cymbalta to 60 mg BID - Continue Wellbutrin XL 150 mg every day - Conitnue propranolol 10 mg BID prn for anxiety - Continue Lunesta 2 mg QHS as needed for insomnia - Neuropsych referral - Sleep specialist appointment delayed until cancer piece is resolved  - Continue therapy with Cory Gomez Gomez twice a month - Going to support groups for people with cancer - Crisis resources reviewed - Follow up in 1 month  Chief Complaint:  Chief Complaint  Patient presents with   Follow-up   HPI: Cory Gomez Gomez presents reporting that he is alright other then feeling fatigued/nauseous since starting chemo. He has also some stress about his finances with him being out of work. He is on disability and his wife is still working that helps.   Cory Gomez Gomez reports that his anxiety has been bit better with the propranolol and increase in cymbalta.  He still can feel some anxiety during the weeks of his treatment and frustration about his overall situation however does not find this as disruptive in his life.  He is also benefited from therapy and focusing on the positives that he has going on currently.  Patient takes the propranolol once a day on the days treatment and then will occasionally use it on the off weeks.  He is also increased the Cymbalta to 60 mg twice a day and denies any adverse side effects from either.  While his anxiety has not resolved completely he does feel like he is in  a good place currently.  She reports that his sleep can vary. Sometimes alright other times not. Will wake up randomly during the night on occasion to go to the bathroom. This is typical but now he is having a harder time falling back asleep. When he does eventually fall back asleep it would be broken.  He is unsure on whether the difficulty falling back to sleep is related to the chemo, anxiety, or  combo of the 2.  Of note he is taking the Lunesta every other night.  We discussed the possibility of trying propranolol when he does wake up in the middle of the night which patient was open to.  Past Psychiatric History: First episode of depression was in early elementary school after his parents divorced. He was in counseling and prescribed bupropion then. Later, as an adult, he was briefly on fluoxetine with no clear benefit. While in school he was diagnosed with having ADHD inattentive type and was on Ritalin. He had seen Cory Gomez Gomez from 2020-2022. He has no hx of inpatient psychiatric admissions, no hx of mania or overt psychosis. Denies any prior suicide attempts.   Has tried Adderall, Vyvanse, Ritalin, bupropion, Prozac, Zoloft, Cymbalta, trazodone, Zolpidem, topirimate, Lunesta   He does not abuse alcohol or street drugs.  Past Medical History:  Past Medical History:  Diagnosis Date   ADHD (attention deficit hyperactivity disorder)    Annual physical exam 09/09/2019   Anxiety and depression    Cancer (HCC)    Colon cancer (HCC)    COVID-19    12/28/19   Depression, recurrent (HCC) 09/09/2019   Headache 12/31/2021   History of kidney stones    Hypertriglyceridemia 07/17/2021   Insomnia    Inverted nipple 09/16/2019   Kidney stone    Mastoiditis of both sides    Nystagmus 01/22/2022   Obesity (BMI 30-39.9)    Obesity (BMI 30-39.9)    Suicidal ideation 07/14/2021   Vertigo     Past Surgical History:  Procedure Laterality Date   COLON RESECTION  09/25/2023   PORTACATH PLACEMENT Right 10/17/2023   Procedure: INSERTION PORT-A-CATH WITH Cory Gomez Gomez GUIDANCE;  Surgeon: Cory Gomez Soda, MD;  Location: [facility];  Service: General;  Laterality: Right;   PROCTOSCOPY N/A 09/25/2023   Procedure: RIGID PROCTOSCOPY;  Surgeon: Cory Gomez Soda, MD;  Location: [facility];  Service: General;  Laterality: N/A;    Family History:  Family History  Problem Relation Age of Onset    Diabetes Mother    Hypertension Mother    Hyperlipidemia Mother    Diabetes Mellitus II Mother    Anxiety disorder Mother    Depression Mother    Basal cell carcinoma Mother 86 - 40   Stroke Father    Alcoholism Father    Anxiety disorder Brother    Depression Brother    Hypertension Brother    Hyperlipidemia Brother    Diabetes type II Brother    ADD / ADHD Brother    Squamous cell carcinoma Maternal Grandmother    Dementia Maternal Grandfather    Basal cell carcinoma Maternal Grandfather    Prostate cancer Maternal Grandfather 43   Lung cancer Paternal Grandfather 30       smoked   Stomach cancer Neg Hx    Colon cancer Neg Hx    Esophageal cancer Neg Hx    Pancreatic cancer Neg Hx     Social History:  Social History   Socioeconomic History   Marital status: Married  Spouse name: Not on file   Number of children: Not on file   Years of education: Not on file   Highest education level: Not on file  Occupational History   Not on file  Tobacco Use   Smoking status: Never   Smokeless tobacco: Never  Vaping Use   Vaping status: Never Used  Substance and Sexual Activity   Alcohol use: Never   Drug use: Never   Sexual activity: Yes    Partners: Female  Other Topics Concern   Not on file  Social History Narrative   Married no kids wife is Clive Dishaw    -wife and mother [name] on [place]      Has siblings    Works Educational psychologist IT [employer]   1 dog    Social Determinants of Health   Financial Resource Strain: High Risk (10/11/2023)   Overall Financial Resource Strain (CARDIA)    Difficulty of Paying Living Expenses: Very hard  Food Insecurity: Food Insecurity Present (09/30/2023)   Hunger Vital Sign    Worried About Running Out of Food in the Last Year: Sometimes true    Ran Out of Food in the Last Year: Sometimes true  Transportation Needs: No Transportation Needs (09/30/2023)   PRAPARE - Administrator, Civil Service (Medical): No     Lack of Transportation (Non-Medical): No  Physical Activity: Not on file  Stress: Not on file  Social Connections: Not on file    Allergies: No Known Allergies  Current Medications: Current Outpatient Medications  Medication Sig Dispense Refill   Acetaminophen (TYLENOL PO) Take 500 mg by mouth every 6 (six) hours.     buPROPion (WELLBUTRIN XL) 150 MG 24 hr tablet Take 1 tablet (150 mg total) by mouth every morning. 30 tablet 2   DULoxetine (CYMBALTA) 60 MG capsule Take 1 capsule (60 mg total) by mouth 2 (two) times daily. 60 capsule 2   eszopiclone (LUNESTA) 2 MG TABS tablet Take 1 tablet (2 mg total) by mouth at bedtime as needed for sleep. Take immediately before bedtime 30 tablet 2   lidocaine-prilocaine (EMLA) cream Apply to affected area once 30 g 3   ondansetron (ZOFRAN-ODT) 8 MG disintegrating tablet Take 1 tablet (8 mg total) by mouth every 8 (eight) hours as needed for nausea or vomiting. (Patient taking differently: Take 8 mg by mouth every 8 (eight) hours as needed for nausea or vomiting. Have not started) 30 tablet 1   oxyCODONE (OXY IR/ROXICODONE) 5 MG immediate release tablet Take 1 tablet (5 mg total) by mouth every 6 (six) hours as needed for moderate pain, severe pain or breakthrough pain. (Patient not taking: Reported on 11/06/2023) 20 tablet 0   prochlorperazine (COMPAZINE) 10 MG tablet Take 1 tablet (10 mg total) by mouth every 6 (six) hours as needed for nausea or vomiting. (Patient taking differently: Take 10 mg by mouth every 6 (six) hours as needed for nausea or vomiting. Have not started) 30 tablet 1   propranolol (INDERAL) 10 MG tablet Take 1 tablet (10 mg total) by mouth 2 (two) times daily as needed. 60 tablet 2   No current facility-administered medications for this visit.     Psychiatric Specialty Exam: Review of Systems  There were no vitals taken for this visit.There is no height or weight on file to calculate BMI.  General Appearance: Well Groomed  Eye  Contact:  Good  Speech:  Clear and Coherent and Normal Rate  Volume:  Normal  Mood:  Anxious and Euthymic  Affect:  Congruent  Thought Process:  Coherent and Goal Directed  Orientation:  Full (Time, Place, and Person)  Thought Content: Logical   Suicidal Thoughts:  No  Homicidal Thoughts:  No  Memory:  NA  Judgement:  Fair  Insight:  Fair  Psychomotor Activity:  Normal  Concentration:  Concentration: Good  Recall:  Good  Fund of Knowledge: Fair  Language: Good  Akathisia:  NA    AIMS (if indicated): not done  Assets:  Passenger transport manager Vocational/Educational  ADL's:  Intact  Cognition: WNL  Sleep:  Good   Metabolic Disorder Labs: Lab Results  Component Value Date   HGBA1C 5.7 (H) 09/25/2023   MPG 116.89 09/25/2023   No results found for: "PROLACTIN" Lab Results  Component Value Date   CHOL 187 05/08/2023   TRIG 178.0 (H) 05/08/2023   HDL 33.80 (L) 05/08/2023   CHOLHDL 6 05/08/2023   VLDL 35.6 05/08/2023   LDLCALC 117 (H) 05/08/2023   LDLCALC 102 (H) 06/11/2019   Lab Results  Component Value Date   TSH 3.16 05/08/2023   TSH 2.91 06/14/2021    Therapeutic Level Labs: No results found for: "LITHIUM" No results found for: "VALPROATE" No results found for: "CBMZ"   Screenings: GAD-7    Flowsheet Row Office Visit from 07/18/2023 in [facility] Visit from 05/29/2023 in [facility] Visit from 03/12/2023 in [facility] Visit from 01/15/2023 [facility] Office Visit from 12/03/2022 in [facility]  Total GAD-7 Score 13 6 8 18 20       PHQ2-9    Flowsheet Row Office Visit from 07/18/2023 in [facility] Visit from 05/29/2023 in [facility] Visit from 03/12/2023 in [facility] Visit from 01/15/2023 in [facility] Office Visit from 12/03/2022 in [facility]  PHQ-2 Total Score 2 2 2 5 5   PHQ-9 Total Score 7 10 5 19 23       Flowsheet Row Admission (Discharged) from 10/17/2023 in [facility] Admission (Discharged) from 09/25/2023 in [facility] [place] [facility] Pre-Admission Testing 60 from 09/18/2023 in [facility]  C-SSRS RISK CATEGORY No Risk No Risk No Risk       Collaboration of Care: Collaboration of Care: Medication Management AEB medication prescription and Other provider involved in patient's care AEB oncology chart review  Patient/Guardian was advised Release of Information must be obtained prior to any record release in order to collaborate their care with an outside provider. Patient/Guardian was advised if they have not already done so to contact the registration department to sign all necessary forms in order for [place] to release information regarding their care.   Consent: Patient/Guardian gives verbal consent for treatment and assignment of benefits for services provided during this visit. Patient/Guardian expressed understanding and agreed to proceed.    Stasia Cavalier, MD 11/29/2023, 8:30 AM   Virtual Visit via Video Note  I connected with Cory Gomez Gomez on 11/29/23 at  8:00 AM EST by a video enabled telemedicine application and verified that I am speaking with the correct person using two identifiers.  Location: Patient: Home Provider: Home Office   I discussed the limitations  of evaluation and management by telemedicine and the availability of in person appointments. The patient expressed understanding and agreed to proceed.   I discussed the assessment and treatment plan with the patient. The patient was provided an  opportunity to ask questions and all were answered. The patient agreed with the plan and demonstrated an understanding of the instructions.   The patient was advised to call back or seek an in-person evaluation if the symptoms worsen or if the condition fails to improve as anticipated.  I provided 30 minutes of non-face-to-face time during this encounter.   Stasia Cavalier, MD

## 2023-11-29 ENCOUNTER — Telehealth (HOSPITAL_BASED_OUTPATIENT_CLINIC_OR_DEPARTMENT_OTHER): Payer: BC Managed Care – PPO | Admitting: Psychiatry

## 2023-11-29 ENCOUNTER — Encounter (HOSPITAL_COMMUNITY): Payer: Self-pay | Admitting: Psychiatry

## 2023-11-29 DIAGNOSIS — F330 Major depressive disorder, recurrent, mild: Secondary | ICD-10-CM | POA: Diagnosis not present

## 2023-11-29 DIAGNOSIS — F411 Generalized anxiety disorder: Secondary | ICD-10-CM | POA: Diagnosis not present

## 2023-11-29 MED ORDER — BUPROPION HCL ER (XL) 150 MG PO TB24: 150.0000 mg | ORAL_TABLET | ORAL | 2 refills | Status: DC

## 2023-12-02 ENCOUNTER — Ambulatory Visit (INDEPENDENT_AMBULATORY_CARE_PROVIDER_SITE_OTHER): Payer: BC Managed Care – PPO | Admitting: Clinical

## 2023-12-02 ENCOUNTER — Encounter (HOSPITAL_COMMUNITY): Payer: Self-pay | Admitting: Clinical

## 2023-12-02 DIAGNOSIS — F411 Generalized anxiety disorder: Secondary | ICD-10-CM

## 2023-12-02 DIAGNOSIS — F330 Major depressive disorder, recurrent, mild: Secondary | ICD-10-CM | POA: Diagnosis not present

## 2023-12-02 NOTE — Progress Notes (Unsigned)
THERAPIST PROGRESS NOTE  Session Time:  11:01am-12:01pm  Session #10  Participation Level: Active  Behavioral Response: Casual Alert Euthymic and positive    Type of Therapy: Individual Therapy  Goal: LTG: Jaden "[name]" will score less than 5 on the Generalized Anxiety Disorder 7 Scale (GAD-7) Goal: STG: Gadge "[name]" will reduce frequency of avoidant behaviors by 50% as evidenced by self-report in therapy sessions Goal: LTG: Learn a variety of coping skills and demonstrate the ability to use them to decrease feelings of sadness, anger, and fear and increase feelings of happiness, peace, and powerfulness AEB gauging those emotions on 1-10 scale. Goal: LTG: Learn breathing techniques and grounding techniques at an age-appropriate level and demonstrate mastery in session then report independent use of these skills out of session. Goal: LTG: Reduce frequency, intensity, and duration of depression symptoms so that daily functioning is improved Goal: STG: Reduce overall depression score to no more than 9 on the Patient Health Questionnaire (PHQ-9) Goal: LTG: Explore personal core beliefs, rules and assumptions, and cognitive distortions through therapist using Cognitive Behavioral Therapy; learn how to develop replacement thoughts and challenge unhelpful thoughts. Goal: LTG: Learn additional communication techniques that can assist with better navigating relationships. Goal: LTG: Learn the types of boundaries, the necessity, and how to implement boundaries even when it is difficult Goal: LTG: Review PHQ-9 scores to determine and celebrate progress.  ProgressTowards Goals: Progressing  Interventions: Supportive   Summary: Cory Gomez is a 42 y.o. male who presents with MDD/severe, GAD, insomnia, and relationship problems with his wife.  He presented oriented x5 and stated he was feeling "pretty good considering."  CSW evaluated patient's medication compliance, use of coping tools, and  self-care, as applicable.   He talked at length about his chemotherapy treatment that is ongoing now.  The biggest side-effect he is having is being tired for days after treatments, but he is also starting to lose his hair, finds cold to now be intolerable, and has lost his sense of taste.  We talked throughout the session about the coping skills he is using to get through this situation, the treatment times specifically, and overall the down time when he is used to constantly being busy.  He has picked up a new hobby with bead designs.  His wife is being extremely supportive and drives him quite a bit since he is too tired to drive.  She has been at the same job for [employer] and really likes it.  She has started in therapy and is really liking it.  Patient then spoke at length about his faith and how that is supporting him through this disease.  At first he asked "Why did this happen to me?" But he eventually got to the point of acceptance.  He was angry at a number of people including his pastor, but feels he has gone through the process of forgiving these people.  He stated he has been journaling and this has been immensely helpful in starting to understand how deep his feelings are.  We processed at length the way in which this illness has changed his perspective and how important laughter has become to him.  He and his wife put up their Christmas tree the day after Halloween and seeing it makes him happy.  We talked at length about giving himself permission to reset when he is tired and about possibly exploring more about his faith and what he should do next by writing letters to his Higher Power.  He was very agreeable to this.  Suicidal/Homicidal: No without intent/plan  Therapist Response: Patient is progressing AEB engaging in scheduled therapy session.  Throughout the session, CSW gave patient the opportunity to explore thoughts and feelings associated with current life situations and past/present  stressors.   CSW challenged patient gently and appropriately to consider different ways of looking at reported issues. CSW encouraged patient's expression of feelings and validated these using empathy, active listening, open body language, and unconditional positive regard.   CSW encouraged patient to schedule more therapy sessions for the future, as needed.     Recommendations:  Return to therapy in 3-1/2 weeks, continue to engage in self care behaviors and give himself grace for when he is tired from chemotherapy, continue to journal about his feelings, consider writing letters to his Higher Power  Plan: Return again in 3-1/2 weeks on 1/2  Diagnosis:  Encounter Diagnoses  Name Primary?   Major depressive disorder, recurrent episode, mild (HCC) Yes   GAD (generalized anxiety disorder)      Collaboration of Care: Psychiatrist AEB - psychiatrist and therapist both have access to notes in Epic  Patient/Guardian was advised Release of Information must be obtained prior to any record release in order to collaborate their care with an outside provider. Patient/Guardian was advised if they have not already done so to contact the registration department to sign all necessary forms in order for [place] to release information regarding their care.   Consent: Patient/Guardian gives verbal consent for treatment and assignment of benefits for services provided during this visit. Patient/Guardian expressed understanding and agreed to proceed.   Lynnell Chad, LCSW 12/05/2023

## 2023-12-03 NOTE — Progress Notes (Unsigned)
Patient Care Team: [name], MD as PCP - General (Family Medicine) Karie Soda, MD as Consulting Physician (General Surgery) Hilarie Fredrickson, MD as Consulting Physician (Gastroenterology) Malachy Mood, MD as Consulting Physician (Medical Oncology) Suanne Marker, MD as Consulting Physician (Neurology) Laren Boom, DO as Consulting Physician (Otolaryngology)  Clinic Day:  12/04/2023  Referring physician: Malachy Mood, MD  ASSESSMENT & PLAN:   Assessment & Plan: Cancer of sigmoid colon - pT4a, pN1a - s/p roboric LAR resection 09/27/2023 -pT4aN1M0 stage IIIB, MMS, G2 -Presented with perforated diverticulitis in sigmoid colon in July, 2024. -Patient underwent laparoscopic resection of sigmoid colon, path reviewed moderate differentiated adenocarcinoma with 1 positive lymph nodes. -I recommend adjuvant chemotherapy FOLFOX for 6 months if he can tolerate.He started on 10/23/2023 12/04/2023 -today is cycle 4 day 1 of adjuvant FOLFOX   Plan: Labs reviewed  -CBC showing WBC 3.4; Hgb 11.2; Hct 34.1; Plt 215; Anc 1.9 -CMP - K 3.5; glucose 155; BUN 11; Creatinine 0.89; eGFR >60; Ca 8.9; LFTs normal.   Proceed with cycle 4 day 1 of adjuvant FOLFOX. Next cycle chemotherapy scheduled for 12/24/2023.  The patient understands the plans discussed today and is in agreement with them.  He knows to contact our office if he develops concerns prior to his next appointment.  I provided 25 minutes of face-to-face time during this encounter and > 50% was spent counseling as documented under my assessment and plan.    Carlean Jews, NP  [facility] - [facility] [address] [place] [address] Dept: [phone] Dept Fax: [phone]   No orders of the defined types were placed in this encounter.     CHIEF COMPLAINT:  CC: Cancer of sigmoid colon  Current Treatment: Adjuvant FOLFOX  INTERVAL  HISTORY:  Cory Gomez is here today for repeat clinical assessment.  He was last seen by Dr. Mosetta Putt on 11/20/2023.  Has been having manageable side effects which include nausea, cold sensitivity, fatigue.  He states that cold sensitivity is mostly in hands and in back of throat when eating or drinking cold things.  Usually last for 3 to 4 days and resolves on its own.  Fatigue is persistent.  He denies chest pain, chest pressure, or shortness of breath. He denies headaches or visual disturbances. He denies abdominal pain, nausea, vomiting, or changes in bowel or bladder habits.   His appetite is good. His weight has increased 3 pounds over last 2 weeks .  I have reviewed the past medical history, past surgical history, social history and family history with the patient and they are unchanged from previous note.  ALLERGIES:  has No Known Allergies.  MEDICATIONS:  Current Outpatient Medications  Medication Sig Dispense Refill   Acetaminophen (TYLENOL PO) Take 500 mg by mouth every 6 (six) hours.     buPROPion (WELLBUTRIN XL) 150 MG 24 hr tablet Take 1 tablet (150 mg total) by mouth every morning. 30 tablet 2   DULoxetine (CYMBALTA) 60 MG capsule Take 1 capsule (60 mg total) by mouth 2 (two) times daily. 60 capsule 2   eszopiclone (LUNESTA) 2 MG TABS tablet Take 1 tablet (2 mg total) by mouth at bedtime as needed for sleep. Take immediately before bedtime 30 tablet 2   lidocaine-prilocaine (EMLA) cream Apply to affected area once 30 g 3   ondansetron (ZOFRAN-ODT) 8 MG disintegrating tablet Take 1 tablet (8 mg total) by mouth  every 8 (eight) hours as needed for nausea or vomiting. (Patient taking differently: Take 8 mg by mouth every 8 (eight) hours as needed for nausea or vomiting. Have not started) 30 tablet 1   oxyCODONE (OXY IR/ROXICODONE) 5 MG immediate release tablet Take 1 tablet (5 mg total) by mouth every 6 (six) hours as needed for moderate pain, severe pain or breakthrough pain. 20 tablet 0    prochlorperazine (COMPAZINE) 10 MG tablet Take 1 tablet (10 mg total) by mouth every 6 (six) hours as needed for nausea or vomiting. (Patient taking differently: Take 10 mg by mouth every 6 (six) hours as needed for nausea or vomiting. Have not started) 30 tablet 1   propranolol (INDERAL) 10 MG tablet Take 1 tablet (10 mg total) by mouth 2 (two) times daily as needed. 60 tablet 2   No current facility-administered medications for this visit.    HISTORY OF PRESENT ILLNESS:   Oncology History  Cancer of sigmoid colon - pT4a, pN1a - s/p roboric LAR resection 09/27/2023  09/25/2023 Cancer Staging   Staging form: Colon and Rectum, AJCC 8th Edition - Pathologic stage from 09/25/2023: Stage IIIB (pT4a, pN1a, cM0) - Signed by Carlean Jews, NP on 10/09/2023 Total positive nodes: 1 Histologic grading system: 4 grade system Histologic grade (G): G2 Residual tumor (R): R0 - None   09/25/2023 Surgery   Laparoscopic resection of sigmoid colon  Invasive, moderately differentiated adenocarcinoma of sigmoid colon. Invades into werosal surface. Margins are clear. Negative lymphovascular and perineural invasion. Metastasis to 1/45 lymph nodes.    09/25/2023 Pathology Results   FINAL MICROSCOPIC DIAGNOSIS:  A.   COLON, RESECTION: - Invasive moderately differentiated adenocarcinoma, 7.8 cm involving sigmoid colon - Carcinoma invades into the serosal surface - Resection margins are negative for carcinoma - Negative for lymphovascular or perineural invasion - Metastatic carcinoma to one of forty-five lymph nodes (1/45) - See oncology table B.   COLON, DISTAL MARGIN, EXCISION: - Colonic donut within normal limits, negative for carcinoma  ONCOLOGY TABLE:  COLON AND RECTUM, CARCINOMA:  Resection, Including Transanal Disk Excision of Rectal Neoplasms  Procedure: Resection, rectosigmoid colon Tumor Site: Sigmoid colon Tumor Size: 7.8 cm Macroscopic Tumor Perforation: Not identified  Histologic Type:  Adenocarcinoma Histologic Grade: G2: Moderately differentiated Multiple Primary Sites: Not applicable Tumor Extension: Carcinoma invades into the serosal surface Lymphovascular Invasion: Not identified Perineural Invasion: Not identified Treatment Effect: No known presurgical therapy Margins:      Margin Status for Invasive Carcinoma: All margins negative for invasive carcinoma      Margin Status for Non-Invasive Tumor: All margins negative for high-grade dysplasia / intramucosal           carcinoma and low-grade dysplasia Regional Lymph Nodes:      Number of Lymph Nodes with Tumor: 1      Number of Lymph Nodes Examined: 45 Tumor Deposits: Not identified Distant Metastasis:      Distant Site(s) Involved: Not applicable Pathologic Stage Classification (pTNM, AJCC 8th Edition): pT4a, pN1a Ancillary Studies: MMR / MSI testing will be ordered. Representative Tumor Block: A2 Comments: None (v4.2.0.1)     09/30/2023 Initial Diagnosis   Cancer of sigmoid colon - pT4a, pN1a - s/p roboric LAR resection 09/27/2023   10/08/2023 Imaging   CT Chest with contrast  IMPRESSION: Negative. No evidence of thoracic metastatic disease or other significant abnormality.   10/23/2023 -  Chemotherapy   Patient is on Treatment Plan : COLORECTAL FOLFOX q14d x 6 months  Genetic Testing   [facility] CancerNext-Expanded Panel+RNA was Negative. Report date is 11/15/2023.   The CancerNext-Expanded gene panel offered by [facility] and includes sequencing, rearrangement, and RNA analysis for the following 71 genes: AIP, ALK, APC, ATM, AXIN2, BAP1, BARD1, BMPR1A, BRCA1, BRCA2, BRIP1, CDC73, CDH1, CDK4, CDKN1B, CDKN2A, CHEK2, CTNNA1, DICER1, FH, FLCN, KIF1B, LZTR1, MAX, MEN1, MET, MLH1, MSH2, MSH3, MSH6, MUTYH, NF1, NF2, NTHL1, PALB2, PHOX2B, PMS2, POT1, PRKAR1A, PTCH1, PTEN, RAD51C, RAD51D, RB1, RET, SDHA, SDHAF2, SDHB, SDHC, SDHD, SMAD4, SMARCA4, SMARCB1, SMARCE1, STK11, SUFU, TMEM127, TP53, TSC1, TSC2, and  VHL (sequencing and deletion/duplication); EGFR, EGLN1, HOXB13, KIT, MITF, PDGFRA, POLD1, and POLE (sequencing only); EPCAM and GREM1 (deletion/duplication only).         REVIEW OF SYSTEMS:   Constitutional: Denies fevers, chills or abnormal weight loss. Appetite good  Eyes: Denies blurriness of vision Ears, nose, mouth, throat, and face: Denies mucositis or sore throat Respiratory: Denies cough, dyspnea or wheezes Cardiovascular: Denies palpitation, chest discomfort or lower extremity swelling Gastrointestinal:  Denies nausea, heartburn or change in bowel habits Skin: Denies abnormal skin rashes Lymphatics: Denies new lymphadenopathy or easy bruising Neurological:Denies numbness, tingling or new weaknesses. Has cold sensitivity in hands and throat for 3 to 4 days after chemotherapy treatment. Resolves on it's own.  Behavioral/Psych: Mood is stable, no new changes  All other systems were reviewed with the patient and are negative.   VITALS:   Today's Vitals   12/04/23 1048 12/04/23 1053  BP: (!) 142/86   Pulse: 87   Resp: 18   Temp: 97.6 F (36.4 C)   TempSrc: Temporal   SpO2: 98%   Weight: 279 lb 14.4 oz (127 kg)   PainSc:  0-No pain   Body mass index is 35.94 kg/m.   Wt Readings from Last 3 Encounters:  12/04/23 279 lb 14.4 oz (127 kg)  11/20/23 276 lb 11.2 oz (125.5 kg)  11/06/23 277 lb (125.6 kg)    Body mass index is 35.94 kg/m.  Performance status (ECOG): 1 - Symptomatic but completely ambulatory  PHYSICAL EXAM:   GENERAL:alert, no distress and comfortable SKIN: skin color, texture, turgor are normal, no rashes or significant lesions EYES: normal, Conjunctiva are pink and non-injected, sclera clear OROPHARYNX:no exudate, no erythema and lips, buccal mucosa, and tongue normal  NECK: supple, thyroid normal size, non-tender, without nodularity LYMPH:  no palpable lymphadenopathy in the cervical, axillary or inguinal LUNGS: clear to auscultation and percussion  with normal breathing effort HEART: regular rate & rhythm and no murmurs and no lower extremity edema ABDOMEN:abdomen soft, non-tender and normal bowel sounds Musculoskeletal:no cyanosis of digits and no clubbing  NEURO: alert & oriented x 3 with fluent speech, no focal motor/sensory deficits  LABORATORY DATA:  I have reviewed the data as listed    Component Value Date/Time   NA 138 11/20/2023 1010   K 3.9 11/20/2023 1010   CL 104 11/20/2023 1010   CO2 29 11/20/2023 1010   GLUCOSE 97 11/20/2023 1010   BUN 14 11/20/2023 1010   CREATININE 0.90 11/20/2023 1010   CALCIUM 9.4 11/20/2023 1010   PROT 7.7 11/20/2023 1010   ALBUMIN 4.4 11/20/2023 1010   AST 12 (L) 11/20/2023 1010   ALT 12 11/20/2023 1010   ALKPHOS 64 11/20/2023 1010   BILITOT 0.6 11/20/2023 1010   GFRNONAA >60 11/20/2023 1010     Lab Results  Component Value Date   WBC 3.4 (L) 12/04/2023   NEUTROABS 1.9 12/04/2023   HGB 11.2 (L) 12/04/2023  HCT 34.1 (L) 12/04/2023   MCV 77.5 (L) 12/04/2023   PLT 215 12/04/2023

## 2023-12-03 NOTE — Assessment & Plan Note (Signed)
-  pT4aN1M0 stage IIIB, MMS, G2 -Presented with perforated diverticulitis in sigmoid colon in July, 2024. -Patient underwent laparoscopic resection of sigmoid colon, path reviewed moderate differentiated adenocarcinoma with 1 positive lymph nodes. -I recommend adjuvant chemotherapy FOLFOX for 6 months if he can tolerate.He started on 10/23/2023 12/04/2023 -today is cycle 4 day 1 of adjuvant FOLFOX

## 2023-12-04 ENCOUNTER — Encounter: Payer: Self-pay | Admitting: Nurse Practitioner

## 2023-12-04 ENCOUNTER — Inpatient Hospital Stay (HOSPITAL_BASED_OUTPATIENT_CLINIC_OR_DEPARTMENT_OTHER): Payer: BC Managed Care – PPO | Admitting: Nurse Practitioner

## 2023-12-04 ENCOUNTER — Inpatient Hospital Stay: Payer: BC Managed Care – PPO

## 2023-12-04 ENCOUNTER — Inpatient Hospital Stay: Payer: BC Managed Care – PPO | Attending: Nurse Practitioner

## 2023-12-04 VITALS — BP 142/86 | HR 87 | Temp 97.6°F | Resp 18 | Wt 279.9 lb

## 2023-12-04 DIAGNOSIS — C187 Malignant neoplasm of sigmoid colon: Secondary | ICD-10-CM | POA: Insufficient documentation

## 2023-12-04 DIAGNOSIS — Z5111 Encounter for antineoplastic chemotherapy: Secondary | ICD-10-CM | POA: Diagnosis not present

## 2023-12-04 DIAGNOSIS — R110 Nausea: Secondary | ICD-10-CM | POA: Diagnosis not present

## 2023-12-04 DIAGNOSIS — R5383 Other fatigue: Secondary | ICD-10-CM | POA: Insufficient documentation

## 2023-12-04 DIAGNOSIS — Z452 Encounter for adjustment and management of vascular access device: Secondary | ICD-10-CM | POA: Diagnosis not present

## 2023-12-04 DIAGNOSIS — Z95828 Presence of other vascular implants and grafts: Secondary | ICD-10-CM

## 2023-12-04 LAB — CBC WITH DIFFERENTIAL (CANCER CENTER ONLY)
Abs Immature Granulocytes: 0.01 10*3/uL (ref 0.00–0.07)
Basophils Absolute: 0 10*3/uL (ref 0.0–0.1)
Basophils Relative: 1 %
Eosinophils Absolute: 0.1 10*3/uL (ref 0.0–0.5)
Eosinophils Relative: 2 %
HCT: 34.1 % — ABNORMAL LOW (ref 39.0–52.0)
Hemoglobin: 11.2 g/dL — ABNORMAL LOW (ref 13.0–17.0)
Immature Granulocytes: 0 %
Lymphocytes Relative: 35 %
Lymphs Abs: 1.2 10*3/uL (ref 0.7–4.0)
MCH: 25.5 pg — ABNORMAL LOW (ref 26.0–34.0)
MCHC: 32.8 g/dL (ref 30.0–36.0)
MCV: 77.5 fL — ABNORMAL LOW (ref 80.0–100.0)
Monocytes Absolute: 0.3 10*3/uL (ref 0.1–1.0)
Monocytes Relative: 8 %
Neutro Abs: 1.9 10*3/uL (ref 1.7–7.7)
Neutrophils Relative %: 54 %
Platelet Count: 215 10*3/uL (ref 150–400)
RBC: 4.4 MIL/uL (ref 4.22–5.81)
RDW: 17.9 % — ABNORMAL HIGH (ref 11.5–15.5)
WBC Count: 3.4 10*3/uL — ABNORMAL LOW (ref 4.0–10.5)
nRBC: 0 % (ref 0.0–0.2)

## 2023-12-04 LAB — CMP (CANCER CENTER ONLY)
ALT: 12 U/L (ref 0–44)
AST: 12 U/L — ABNORMAL LOW (ref 15–41)
Albumin: 4.2 g/dL (ref 3.5–5.0)
Alkaline Phosphatase: 68 U/L (ref 38–126)
Anion gap: 6 (ref 5–15)
BUN: 11 mg/dL (ref 6–20)
CO2: 25 mmol/L (ref 22–32)
Calcium: 8.9 mg/dL (ref 8.9–10.3)
Chloride: 106 mmol/L (ref 98–111)
Creatinine: 0.89 mg/dL (ref 0.61–1.24)
GFR, Estimated: >60 mL/min (ref >60)
Glucose, Bld: 155 mg/dL — ABNORMAL HIGH (ref 70–99)
Potassium: 3.5 mmol/L (ref 3.5–5.1)
Sodium: 137 mmol/L (ref 135–145)
Total Bilirubin: 0.4 mg/dL (ref <1.2)
Total Protein: 7.2 g/dL (ref 6.5–8.1)

## 2023-12-04 MED ORDER — OXALIPLATIN CHEMO INJECTION 100 MG/20ML
85.0000 mg/m2 | Freq: Once | INTRAVENOUS | Status: AC
  Administered 2023-12-04: 200 mg via INTRAVENOUS
  Filled 2023-12-04: qty 40

## 2023-12-04 MED ORDER — PALONOSETRON HCL INJECTION 0.25 MG/5ML
0.2500 mg | Freq: Once | INTRAVENOUS | Status: AC
Start: 2023-12-04 — End: 2023-12-04
  Administered 2023-12-04: 0.25 mg via INTRAVENOUS
  Filled 2023-12-04: qty 5

## 2023-12-04 MED ORDER — DEXTROSE 5 % IV SOLN: INTRAVENOUS | Status: DC

## 2023-12-04 MED ORDER — DEXAMETHASONE SODIUM PHOSPHATE 10 MG/ML IJ SOLN
10.0000 mg | Freq: Once | INTRAMUSCULAR | Status: AC
  Administered 2023-12-04: 10 mg via INTRAVENOUS
  Filled 2023-12-04: qty 1

## 2023-12-04 MED ORDER — DEXTROSE 5 % IV SOLN
400.0000 mg/m2 | Freq: Once | INTRAVENOUS | Status: AC
  Administered 2023-12-04: 1020 mg via INTRAVENOUS
  Filled 2023-12-04: qty 50

## 2023-12-04 MED ORDER — FLUOROURACIL CHEMO INJECTION 2.5 GM/50ML
400.0000 mg/m2 | Freq: Once | INTRAVENOUS | Status: AC
  Administered 2023-12-04: 1000 mg via INTRAVENOUS
  Filled 2023-12-04: qty 20

## 2023-12-04 MED ORDER — SODIUM CHLORIDE 0.9 % IV SOLN
2400.0000 mg/m2 | INTRAVENOUS | Status: DC
  Administered 2023-12-04: 6100 mg via INTRAVENOUS
  Filled 2023-12-04: qty 122

## 2023-12-04 MED ORDER — SODIUM CHLORIDE 0.9% FLUSH
10.0000 mL | INTRAVENOUS | Status: DC | PRN
Start: 2023-12-04 — End: 2023-12-04
  Administered 2023-12-04: 10 mL

## 2023-12-04 MED ORDER — ONDANSETRON 8 MG PO TBDP: 8.0000 mg | ORAL_TABLET | Freq: Three times a day (TID) | ORAL | 1 refills | Status: DC | PRN

## 2023-12-04 MED ORDER — SODIUM CHLORIDE 0.9% FLUSH: 10.0000 mL | INTRAVENOUS | Status: DC | PRN

## 2023-12-04 NOTE — Patient Instructions (Signed)
 [facility] - A DEPT OF [facility]  Discharge Instructions: Thank you for choosing [facility] to provide your oncology and hematology care.   If you have a lab appointment with the Cancer Center, please go directly to the Cancer Center and check in at the registration area.   Wear comfortable clothing and clothing appropriate for easy access to any Portacath or PICC line.   We strive to give you quality time with your provider. You may need to reschedule your appointment if you arrive late (15 or more minutes).  Arriving late affects you and other patients whose appointments are after yours.  Also, if you miss three or more appointments without notifying the office, you may be dismissed from the clinic at the provider's discretion.      For prescription refill requests, have your pharmacy contact our office and allow 72 hours for refills to be completed.    Today you received the following chemotherapy and/or immunotherapy agents oxaliplatin, leucovorin, fluorourcil      To help prevent nausea and vomiting after your treatment, we encourage you to take your nausea medication as directed.  BELOW ARE SYMPTOMS THAT SHOULD BE REPORTED IMMEDIATELY: *FEVER GREATER THAN 100.4 F (38 C) OR HIGHER *CHILLS OR SWEATING *NAUSEA AND VOMITING THAT IS NOT CONTROLLED WITH YOUR NAUSEA MEDICATION *UNUSUAL SHORTNESS OF BREATH *UNUSUAL BRUISING OR BLEEDING *URINARY PROBLEMS (pain or burning when urinating, or frequent urination) *BOWEL PROBLEMS (unusual diarrhea, constipation, pain near the anus) TENDERNESS IN MOUTH AND THROAT WITH OR WITHOUT PRESENCE OF ULCERS (sore throat, sores in mouth, or a toothache) UNUSUAL RASH, SWELLING OR PAIN  UNUSUAL VAGINAL DISCHARGE OR ITCHING   Items with * indicate a potential emergency and should be followed up as soon as possible or go to the Emergency Department if any problems should occur.  Please show the CHEMOTHERAPY  ALERT CARD or IMMUNOTHERAPY ALERT CARD at check-in to the Emergency Department and triage nurse.  Should you have questions after your visit or need to cancel or reschedule your appointment, please contact [facility] - A DEPT [facility]  Dept: [phone]  and follow the prompts.  Office hours are 8:00 a.m. to 4:30 p.m. Monday - Friday. Please note that voicemails left after 4:00 p.m. may not be returned until the following business day.  We are closed weekends and major holidays. You have access to a nurse at all times for urgent questions. Please call the main number to the clinic Dept: [phone] and follow the prompts.   For any non-urgent questions, you may also contact your provider using MyChart. We now offer e-Visits for anyone 37 and older to request care online for non-urgent symptoms. For details visit [email or web address].   Also download the MyChart app! Go to the app store, search "MyChart", open the app, select [facility], and log in with your MyChart username and password.

## 2023-12-06 ENCOUNTER — Inpatient Hospital Stay: Payer: BC Managed Care – PPO

## 2023-12-06 VITALS — BP 155/84 | HR 93

## 2023-12-06 DIAGNOSIS — Z5111 Encounter for antineoplastic chemotherapy: Secondary | ICD-10-CM | POA: Diagnosis not present

## 2023-12-06 DIAGNOSIS — C187 Malignant neoplasm of sigmoid colon: Secondary | ICD-10-CM | POA: Diagnosis not present

## 2023-12-06 DIAGNOSIS — R110 Nausea: Secondary | ICD-10-CM | POA: Diagnosis not present

## 2023-12-06 DIAGNOSIS — Z452 Encounter for adjustment and management of vascular access device: Secondary | ICD-10-CM | POA: Diagnosis not present

## 2023-12-06 DIAGNOSIS — R5383 Other fatigue: Secondary | ICD-10-CM | POA: Diagnosis not present

## 2023-12-06 MED ORDER — HEPARIN SOD (PORK) LOCK FLUSH 100 UNIT/ML IV SOLN
500.0000 [IU] | Freq: Once | INTRAVENOUS | Status: AC | PRN
Start: 2023-12-06 — End: 2023-12-06
  Administered 2023-12-06: 500 [IU]

## 2023-12-06 MED ORDER — SODIUM CHLORIDE 0.9% FLUSH
10.0000 mL | INTRAVENOUS | Status: DC | PRN
Start: 2023-12-06 — End: 2023-12-06
  Administered 2023-12-06: 10 mL

## 2023-12-22 NOTE — Progress Notes (Signed)
 Patient Care Team: Hope Merle, MD as PCP - General (Family Medicine) Sheldon Standing, MD as Consulting Physician (General Surgery) Abran Norleen SAILOR, MD as Consulting Physician (Gastroenterology) Lanny Callander, MD as Consulting Physician (Medical Oncology) Margaret Eduard SAUNDERS, MD as Consulting Physician (Neurology) Skotnicki, Gerard LABOR, DO as Consulting Physician (Otolaryngology)   CHIEF COMPLAINT: Follow-up sigmoid colon cancer  Oncology History  Cancer of sigmoid colon - pT4a, pN1a - s/p roboric LAR resection 09/27/2023  09/25/2023 Cancer Staging   Staging form: Colon and Rectum, AJCC 8th Edition - Pathologic stage from 09/25/2023: Stage IIIB (pT4a, pN1a, cM0) - Signed by Hanford Powell BRAVO, NP on 10/09/2023 Total positive nodes: 1 Histologic grading system: 4 grade system Histologic grade (G): G2 Residual tumor (R): R0 - None   09/25/2023 Surgery   Laparoscopic resection of sigmoid colon  Invasive, moderately differentiated adenocarcinoma of sigmoid colon. Invades into werosal surface. Margins are clear. Negative lymphovascular and perineural invasion. Metastasis to 1/45 lymph nodes.    09/25/2023 Pathology Results   FINAL MICROSCOPIC DIAGNOSIS:  A.   COLON, RESECTION: - Invasive moderately differentiated adenocarcinoma, 7.8 cm involving sigmoid colon - Carcinoma invades into the serosal surface - Resection margins are negative for carcinoma - Negative for lymphovascular or perineural invasion - Metastatic carcinoma to one of forty-five lymph nodes (1/45) - See oncology table B.   COLON, DISTAL MARGIN, EXCISION: - Colonic donut within normal limits, negative for carcinoma  ONCOLOGY TABLE:  COLON AND RECTUM, CARCINOMA:  Resection, Including Transanal Disk Excision of Rectal Neoplasms  Procedure: Resection, rectosigmoid colon Tumor Site: Sigmoid colon Tumor Size: 7.8 cm Macroscopic Tumor Perforation: Not identified  Histologic Type: Adenocarcinoma Histologic Grade: G2:  Moderately differentiated Multiple Primary Sites: Not applicable Tumor Extension: Carcinoma invades into the serosal surface Lymphovascular Invasion: Not identified Perineural Invasion: Not identified Treatment Effect: No known presurgical therapy Margins:      Margin Status for Invasive Carcinoma: All margins negative for invasive carcinoma      Margin Status for Non-Invasive Tumor: All margins negative for high-grade dysplasia / intramucosal           carcinoma and low-grade dysplasia Regional Lymph Nodes:      Number of Lymph Nodes with Tumor: 1      Number of Lymph Nodes Examined: 45 Tumor Deposits: Not identified Distant Metastasis:      Distant Site(s) Involved: Not applicable Pathologic Stage Classification (pTNM, AJCC 8th Edition): pT4a, pN1a Ancillary Studies: MMR / MSI testing will be ordered. Representative Tumor Block: A2 Comments: None (v4.2.0.1)     09/30/2023 Initial Diagnosis   Cancer of sigmoid colon - pT4a, pN1a - s/p roboric LAR resection 09/27/2023   10/08/2023 Imaging   CT Chest with contrast  IMPRESSION: Negative. No evidence of thoracic metastatic disease or other significant abnormality.   10/23/2023 -  Chemotherapy   Patient is on Treatment Plan : COLORECTAL FOLFOX q14d x 6 months      Genetic Testing   [facility] CancerNext-Expanded Panel+RNA was Negative. Report date is 11/15/2023.   The CancerNext-Expanded gene panel offered by [facility] and includes sequencing, rearrangement, and RNA analysis for the following 71 genes: AIP, ALK, APC, ATM, AXIN2, BAP1, BARD1, BMPR1A, BRCA1, BRCA2, BRIP1, CDC73, CDH1, CDK4, CDKN1B, CDKN2A, CHEK2, CTNNA1, DICER1, FH, FLCN, KIF1B, LZTR1, MAX, MEN1, MET, MLH1, MSH2, MSH3, MSH6, MUTYH, NF1, NF2, NTHL1, PALB2, PHOX2B, PMS2, POT1, PRKAR1A, PTCH1, PTEN, RAD51C, RAD51D, RB1, RET, SDHA, SDHAF2, SDHB, SDHC, SDHD, SMAD4, SMARCA4, SMARCB1, SMARCE1, STK11, SUFU, TMEM127, TP53, TSC1, TSC2, and  VHL (sequencing and  deletion/duplication); EGFR, EGLN1, HOXB13, KIT, MITF, PDGFRA, POLD1, and POLE (sequencing only); EPCAM and GREM1 (deletion/duplication only).        CURRENT THERAPY: Adjuvant FOLFOX x 6 months, starting 10/23/2023  INTERVAL HISTORY Cory Gomez returns for follow-up and treatment as scheduled, last seen by Powell Lessen, NP 12/04/2023 with cycle 4 FOLFOX.  Doing well with treatment with mild fatigue but able to remain out of bed and active.  He has cold sensitivity for 4-5 days, without neuropathy in the absence of cold.  Mild intermittent nausea is not a major problem, able to eat and drink.  ROS  All other systems reviewed and negative  Past Medical History:  Diagnosis Date   ADHD (attention deficit hyperactivity disorder)    Annual physical exam 09/09/2019   Anxiety and depression    Cancer (HCC)    Colon cancer (HCC)    COVID-19    12/28/19   Depression, recurrent (HCC) 09/09/2019   Headache 12/31/2021   History of kidney stones    Hypertriglyceridemia 07/17/2021   Insomnia    Inverted nipple 09/16/2019   Kidney stone    Mastoiditis of both sides    Nystagmus 01/22/2022   Obesity (BMI 30-39.9)    Obesity (BMI 30-39.9)    Suicidal ideation 07/14/2021   Vertigo      Past Surgical History:  Procedure Laterality Date   COLON RESECTION  09/25/2023   PORTACATH PLACEMENT Right 10/17/2023   Procedure: INSERTION PORT-A-CATH WITH MATILDA GUIDANCE;  Surgeon: Sheldon Standing, MD;  Location: [facility];  Service: General;  Laterality: Right;   PROCTOSCOPY N/A 09/25/2023   Procedure: RIGID PROCTOSCOPY;  Surgeon: Sheldon Standing, MD;  Location: [facility];  Service: General;  Laterality: N/A;     Outpatient Encounter Medications as of 12/24/2023  Medication Sig   Acetaminophen  (TYLENOL  PO) Take 500 mg by mouth every 6 (six) hours.   buPROPion  (WELLBUTRIN  XL) 150 MG 24 hr tablet Take 1 tablet (150 mg total) by mouth every morning.   DULoxetine  (CYMBALTA ) 60 MG capsule Take  1 capsule (60 mg total) by mouth 2 (two) times daily.   eszopiclone  (LUNESTA ) 2 MG TABS tablet Take 1 tablet (2 mg total) by mouth at bedtime as needed for sleep. Take immediately before bedtime   lidocaine -prilocaine  (EMLA ) cream Apply to affected area once   ondansetron  (ZOFRAN -ODT) 8 MG disintegrating tablet Take 1 tablet (8 mg total) by mouth every 8 (eight) hours as needed for nausea or vomiting.   oxyCODONE  (OXY IR/ROXICODONE ) 5 MG immediate release tablet Take 1 tablet (5 mg total) by mouth every 6 (six) hours as needed for moderate pain, severe pain or breakthrough pain.   prochlorperazine  (COMPAZINE ) 10 MG tablet Take 1 tablet (10 mg total) by mouth every 6 (six) hours as needed for nausea or vomiting. (Patient taking differently: Take 10 mg by mouth every 6 (six) hours as needed for nausea or vomiting. Have not started)   propranolol  (INDERAL ) 10 MG tablet Take 1 tablet (10 mg total) by mouth 2 (two) times daily as needed.   No facility-administered encounter medications on file as of 12/24/2023.     Today's Vitals   12/24/23 0910 12/24/23 0913 12/24/23 0919  BP: (!) 186/86 (!) 158/103 130/88  Pulse: (!) 111    Resp: 17    Temp: 98.2 F (36.8 C)    TempSrc: Temporal    SpO2: 100%    Weight: 288 lb 9.6 oz (130.9 kg)  PainSc:   0-No pain   Body mass index is 37.05 kg/m.   PHYSICAL EXAM GENERAL:alert, no distress and comfortable SKIN: no rash  EYES: sclera clear NECK: without mass LYMPH:  no palpable cervical or supraclavicular lymphadenopathy  LUNGS: clear with normal breathing effort HEART: regular rate & rhythm, no lower extremity edema ABDOMEN: abdomen soft, non-tender and normal bowel sounds NEURO: alert & oriented x 3 with fluent speech, no focal motor/sensory deficits PAC without erythema    CBC    Component Value Date/Time   WBC 3.1 (L) 12/24/2023 0830   WBC 10.2 09/26/2023 0436   RBC 4.78 12/24/2023 0830   HGB 12.7 (L) 12/24/2023 0830   HCT 38.0 (L)  12/24/2023 0830   PLT 296 12/24/2023 0830   MCV 79.5 (L) 12/24/2023 0830   MCH 26.6 12/24/2023 0830   MCHC 33.4 12/24/2023 0830   RDW 21.7 (H) 12/24/2023 0830   LYMPHSABS 1.5 12/24/2023 0830   MONOABS 0.5 12/24/2023 0830   EOSABS 0.1 12/24/2023 0830   BASOSABS 0.0 12/24/2023 0830     CMP     Component Value Date/Time   NA 136 12/24/2023 0830   K 3.7 12/24/2023 0830   CL 104 12/24/2023 0830   CO2 24 12/24/2023 0830   GLUCOSE 134 (H) 12/24/2023 0830   BUN 11 12/24/2023 0830   CREATININE 0.86 12/24/2023 0830   CALCIUM  9.4 12/24/2023 0830   PROT 7.7 12/24/2023 0830   ALBUMIN 4.4 12/24/2023 0830   AST 20 12/24/2023 0830   ALT 27 12/24/2023 0830   ALKPHOS 67 12/24/2023 0830   BILITOT 0.4 12/24/2023 0830   GFRNONAA >60 12/24/2023 0830     ASSESSMENT & PLAN: 42 year old male   Cancer of sigmoid colon - pT4a, pN1a - s/p roboric LAR resection 09/27/2023 -pT4aN1M0, grade 2, MSI stable; stage IIIB -Presented with perforated diverticulitis in sigmoid colon in July, 2024. -S/p laparoscopic resection of sigmoid colon, path reviewed moderate differentiated adenocarcinoma with 1 positive lymph nodes. -The recommendation is for adjuvant chemotherapy FOLFOX for 6 months if he can tolerate.He started on 10/23/2023 -CEA 10/28/2023 normal, <1.00 -Mr. Dunklee appears stable.  S/p cycle 4 adjuvant FOLFOX, tolerating well with mild fatigue and cold sensitivity.  Side effects are adequately managed with supportive care at home.  He is able to recover and function well and maintain good performance status -Labs reviewed, ANC 1.0, otherwise stable.  I recommend to add G-CSF on day 3, potential risk/benefit and side effects reviewed.  Patient agrees -Proceed with cycle 5 FOLFOX at full dose, with Udenyca  on day 3. It has been approved. -Follow-up and cycle 6 in 2 weeks as scheduled   PLAN: -Labs reviewed, ANC 1.0, otherwise stable -Proceed with cycle 5 FOLFOX at full dose, with Udenyca  on day 3  (potential risk/benefit and side effects reviewed.  Patient agrees). Will start claritin   -Follow-up and cycle 6 in 2 weeks as scheduled   All questions were answered. The patient knows to call the clinic with any problems, questions or concerns. No barriers to learning were detected. I spent 20 minutes counseling the patient face to face. The total time spent in the appointment was 30 minutes and more than 50% was on counseling, review of test results, and coordination of care.   Iyania Denne, NP-C 12/24/2023

## 2023-12-23 DIAGNOSIS — C187 Malignant neoplasm of sigmoid colon: Secondary | ICD-10-CM | POA: Diagnosis not present

## 2023-12-24 ENCOUNTER — Encounter: Payer: Self-pay | Admitting: Nurse Practitioner

## 2023-12-24 ENCOUNTER — Other Ambulatory Visit: Payer: Self-pay

## 2023-12-24 ENCOUNTER — Inpatient Hospital Stay: Payer: BC Managed Care – PPO

## 2023-12-24 ENCOUNTER — Inpatient Hospital Stay (HOSPITAL_BASED_OUTPATIENT_CLINIC_OR_DEPARTMENT_OTHER): Payer: BC Managed Care – PPO | Admitting: Nurse Practitioner

## 2023-12-24 VITALS — HR 97

## 2023-12-24 DIAGNOSIS — C187 Malignant neoplasm of sigmoid colon: Secondary | ICD-10-CM

## 2023-12-24 DIAGNOSIS — Z5111 Encounter for antineoplastic chemotherapy: Secondary | ICD-10-CM | POA: Diagnosis not present

## 2023-12-24 DIAGNOSIS — R110 Nausea: Secondary | ICD-10-CM | POA: Diagnosis not present

## 2023-12-24 DIAGNOSIS — Z452 Encounter for adjustment and management of vascular access device: Secondary | ICD-10-CM | POA: Diagnosis not present

## 2023-12-24 DIAGNOSIS — R5383 Other fatigue: Secondary | ICD-10-CM | POA: Diagnosis not present

## 2023-12-24 LAB — CBC WITH DIFFERENTIAL (CANCER CENTER ONLY)
Abs Immature Granulocytes: 0.03 10*3/uL (ref 0.00–0.07)
Basophils Absolute: 0 10*3/uL (ref 0.0–0.1)
Basophils Relative: 1 %
Eosinophils Absolute: 0.1 10*3/uL (ref 0.0–0.5)
Eosinophils Relative: 2 %
HCT: 38 % — ABNORMAL LOW (ref 39.0–52.0)
Hemoglobin: 12.7 g/dL — ABNORMAL LOW (ref 13.0–17.0)
Immature Granulocytes: 1 %
Lymphocytes Relative: 49 %
Lymphs Abs: 1.5 10*3/uL (ref 0.7–4.0)
MCH: 26.6 pg (ref 26.0–34.0)
MCHC: 33.4 g/dL (ref 30.0–36.0)
MCV: 79.5 fL — ABNORMAL LOW (ref 80.0–100.0)
Monocytes Absolute: 0.5 10*3/uL (ref 0.1–1.0)
Monocytes Relative: 16 %
Neutro Abs: 1 10*3/uL — ABNORMAL LOW (ref 1.7–7.7)
Neutrophils Relative %: 31 %
Platelet Count: 296 10*3/uL (ref 150–400)
RBC: 4.78 MIL/uL (ref 4.22–5.81)
RDW: 21.7 % — ABNORMAL HIGH (ref 11.5–15.5)
WBC Count: 3.1 10*3/uL — ABNORMAL LOW (ref 4.0–10.5)
nRBC: 0 % (ref 0.0–0.2)

## 2023-12-24 LAB — CMP (CANCER CENTER ONLY)
ALT: 27 U/L (ref 0–44)
AST: 20 U/L (ref 15–41)
Albumin: 4.4 g/dL (ref 3.5–5.0)
Alkaline Phosphatase: 67 U/L (ref 38–126)
Anion gap: 8 (ref 5–15)
BUN: 11 mg/dL (ref 6–20)
CO2: 24 mmol/L (ref 22–32)
Calcium: 9.4 mg/dL (ref 8.9–10.3)
Chloride: 104 mmol/L (ref 98–111)
Creatinine: 0.86 mg/dL (ref 0.61–1.24)
GFR, Estimated: >60 mL/min (ref >60)
Glucose, Bld: 134 mg/dL — ABNORMAL HIGH (ref 70–99)
Potassium: 3.7 mmol/L (ref 3.5–5.1)
Sodium: 136 mmol/L (ref 135–145)
Total Bilirubin: 0.4 mg/dL (ref 0.0–1.2)
Total Protein: 7.7 g/dL (ref 6.5–8.1)

## 2023-12-24 LAB — CEA (ACCESS): CEA (CHCC): <1 ng/mL (ref 0.00–5.00)

## 2023-12-24 MED ORDER — SODIUM CHLORIDE 0.9% FLUSH
10.0000 mL | INTRAVENOUS | Status: DC | PRN
Start: 2023-12-24 — End: 2023-12-24

## 2023-12-24 MED ORDER — OXALIPLATIN CHEMO INJECTION 100 MG/20ML
85.0000 mg/m2 | Freq: Once | INTRAVENOUS | Status: AC
  Administered 2023-12-24: 200 mg via INTRAVENOUS
  Filled 2023-12-24: qty 40

## 2023-12-24 MED ORDER — DEXAMETHASONE SODIUM PHOSPHATE 10 MG/ML IJ SOLN
10.0000 mg | Freq: Once | INTRAMUSCULAR | Status: AC
  Administered 2023-12-24: 10 mg via INTRAVENOUS
  Filled 2023-12-24: qty 1

## 2023-12-24 MED ORDER — SODIUM CHLORIDE 0.9 % IV SOLN
2400.0000 mg/m2 | INTRAVENOUS | Status: DC
  Administered 2023-12-24: 6100 mg via INTRAVENOUS
  Filled 2023-12-24: qty 122

## 2023-12-24 MED ORDER — DEXTROSE 5 % IV SOLN: INTRAVENOUS | Status: DC

## 2023-12-24 MED ORDER — HEPARIN SOD (PORK) LOCK FLUSH 100 UNIT/ML IV SOLN
500.0000 [IU] | Freq: Once | INTRAVENOUS | Status: DC | PRN
Start: 2023-12-24 — End: 2023-12-24

## 2023-12-24 MED ORDER — PALONOSETRON HCL INJECTION 0.25 MG/5ML
0.2500 mg | Freq: Once | INTRAVENOUS | Status: AC
  Administered 2023-12-24: 0.25 mg via INTRAVENOUS
  Filled 2023-12-24: qty 5

## 2023-12-24 MED ORDER — FLUOROURACIL CHEMO INJECTION 2.5 GM/50ML
400.0000 mg/m2 | Freq: Once | INTRAVENOUS | Status: AC
  Administered 2023-12-24: 1000 mg via INTRAVENOUS
  Filled 2023-12-24: qty 20

## 2023-12-24 MED ORDER — LEUCOVORIN CALCIUM INJECTION 350 MG
400.0000 mg/m2 | Freq: Once | INTRAVENOUS | Status: AC
  Administered 2023-12-24: 1020 mg via INTRAVENOUS
  Filled 2023-12-24: qty 50

## 2023-12-24 NOTE — Patient Instructions (Signed)
 [facility] - A DEPT OF [facility]  Discharge Instructions: Thank you for choosing [facility] to provide your oncology and hematology care.   If you have a lab appointment with the Cancer Center, please go directly to the Cancer Center and check in at the registration area.   Wear comfortable clothing and clothing appropriate for easy access to any Portacath or PICC line.   We strive to give you quality time with your provider. You may need to reschedule your appointment if you arrive late (15 or more minutes).  Arriving late affects you and other patients whose appointments are after yours.  Also, if you miss three or more appointments without notifying the office, you may be dismissed from the clinic at the provider's discretion.      For prescription refill requests, have your pharmacy contact our office and allow 72 hours for refills to be completed.    Today you received the following chemotherapy and/or immunotherapy agents: Oxaliplatin, Leucovorin, 5FU      To help prevent nausea and vomiting after your treatment, we encourage you to take your nausea medication as directed.  BELOW ARE SYMPTOMS THAT SHOULD BE REPORTED IMMEDIATELY: *FEVER GREATER THAN 100.4 F (38 C) OR HIGHER *CHILLS OR SWEATING *NAUSEA AND VOMITING THAT IS NOT CONTROLLED WITH YOUR NAUSEA MEDICATION *UNUSUAL SHORTNESS OF BREATH *UNUSUAL BRUISING OR BLEEDING *URINARY PROBLEMS (pain or burning when urinating, or frequent urination) *BOWEL PROBLEMS (unusual diarrhea, constipation, pain near the anus) TENDERNESS IN MOUTH AND THROAT WITH OR WITHOUT PRESENCE OF ULCERS (sore throat, sores in mouth, or a toothache) UNUSUAL RASH, SWELLING OR PAIN  UNUSUAL VAGINAL DISCHARGE OR ITCHING   Items with * indicate a potential emergency and should be followed up as soon as possible or go to the Emergency Department if any problems should occur.  Please show the CHEMOTHERAPY ALERT  CARD or IMMUNOTHERAPY ALERT CARD at check-in to the Emergency Department and triage nurse.  Should you have questions after your visit or need to cancel or reschedule your appointment, please contact [facility] - A DEPT OF [facility]  Dept: [phone]  and follow the prompts.  Office hours are 8:00 a.m. to 4:30 p.m. Monday - Friday. Please note that voicemails left after 4:00 p.m. may not be returned until the following business day.  We are closed weekends and major holidays. You have access to a nurse at all times for urgent questions. Please call the main number to the clinic Dept: [phone] and follow the prompts.   For any non-urgent questions, you may also contact your provider using MyChart. We now offer e-Visits for anyone 47 and older to request care online for non-urgent symptoms. For details visit [email or web address].   Also download the MyChart app! Go to the app store, search "MyChart", open the app, select [place], and log in with your MyChart username and password.

## 2023-12-24 NOTE — Progress Notes (Signed)
 CEA normal <1.0

## 2023-12-26 ENCOUNTER — Ambulatory Visit (INDEPENDENT_AMBULATORY_CARE_PROVIDER_SITE_OTHER): Payer: BC Managed Care – PPO | Admitting: Clinical

## 2023-12-26 ENCOUNTER — Encounter (HOSPITAL_COMMUNITY): Payer: Self-pay | Admitting: Clinical

## 2023-12-26 ENCOUNTER — Inpatient Hospital Stay: Payer: BC Managed Care – PPO | Attending: Nurse Practitioner

## 2023-12-26 DIAGNOSIS — I10 Essential (primary) hypertension: Secondary | ICD-10-CM | POA: Diagnosis not present

## 2023-12-26 DIAGNOSIS — F330 Major depressive disorder, recurrent, mild: Secondary | ICD-10-CM

## 2023-12-26 DIAGNOSIS — D701 Agranulocytosis secondary to cancer chemotherapy: Secondary | ICD-10-CM | POA: Insufficient documentation

## 2023-12-26 DIAGNOSIS — Z5111 Encounter for antineoplastic chemotherapy: Secondary | ICD-10-CM | POA: Diagnosis not present

## 2023-12-26 DIAGNOSIS — Z5189 Encounter for other specified aftercare: Secondary | ICD-10-CM | POA: Diagnosis not present

## 2023-12-26 DIAGNOSIS — Z452 Encounter for adjustment and management of vascular access device: Secondary | ICD-10-CM | POA: Diagnosis not present

## 2023-12-26 DIAGNOSIS — D509 Iron deficiency anemia, unspecified: Secondary | ICD-10-CM | POA: Diagnosis not present

## 2023-12-26 DIAGNOSIS — C187 Malignant neoplasm of sigmoid colon: Secondary | ICD-10-CM | POA: Insufficient documentation

## 2023-12-26 DIAGNOSIS — F411 Generalized anxiety disorder: Secondary | ICD-10-CM | POA: Diagnosis not present

## 2023-12-26 MED ORDER — PEGFILGRASTIM-CBQV 6 MG/0.6ML ~~LOC~~ SOSY
6.0000 mg | PREFILLED_SYRINGE | Freq: Once | SUBCUTANEOUS | Status: AC
  Administered 2023-12-26: 6 mg via SUBCUTANEOUS
  Filled 2023-12-26: qty 0.6

## 2023-12-26 MED ORDER — HEPARIN SOD (PORK) LOCK FLUSH 100 UNIT/ML IV SOLN
500.0000 [IU] | Freq: Once | INTRAVENOUS | Status: AC | PRN
  Administered 2023-12-26: 500 [IU]

## 2023-12-26 MED ORDER — SODIUM CHLORIDE 0.9% FLUSH
10.0000 mL | INTRAVENOUS | Status: DC | PRN
  Administered 2023-12-26: 10 mL

## 2023-12-26 NOTE — Progress Notes (Signed)
 THERAPIST PROGRESS NOTE  Session Time:  11:00am-12:00pm  Session #11  Participation Level: Active  Behavioral Response: Casual Alert Euthymic and Positive and Hopeful    Type of Therapy: Individual Therapy  Goal: LTG: Cory Gomez will score less than 5 on the Generalized Anxiety Disorder 7 Scale (GAD-7) Goal: STG: Cory Gomez will reduce frequency of avoidant behaviors by 50% as evidenced by self-report in therapy sessions Goal: LTG: Learn a variety of coping skills and demonstrate the ability to use them to decrease feelings of sadness, anger, and fear and increase feelings of happiness, peace, and powerfulness AEB gauging those emotions on 1-10 scale. Goal: LTG: Learn breathing techniques and grounding techniques at an age-appropriate level and demonstrate mastery in session then report independent use of these skills out of session. Goal: LTG: Reduce frequency, intensity, and duration of depression symptoms so that daily functioning is improved Goal: STG: Reduce overall depression score to no more than 9 on the Patient Health Questionnaire (PHQ-9) Goal: LTG: Explore personal core beliefs, rules and assumptions, and cognitive distortions through therapist using Cognitive Behavioral Therapy; learn how to develop replacement thoughts and challenge unhelpful thoughts. Goal: LTG: Learn additional communication techniques that can assist with better navigating relationships. Goal: LTG: Learn the types of boundaries, the necessity, and how to implement boundaries even when it is difficult Goal: LTG: Review PHQ-9 scores to determine and celebrate progress.  ProgressTowards Goals: Progressing  Interventions: CBT and Supportive   Summary: Cory Gomez is a 43 y.o. male who presents with MDD/severe, GAD, insomnia, and relationship problems with his wife.  He presented oriented x5 and stated he was feeling pretty good.  CSW evaluated patient's medication compliance, use of coping tools, and  self-care, as applicable.   The session was spent in processing his ongoing feelings about having cancer, how his chemotherapy is going, and how he is handling day-to-day life in the present circumstances.  He sees many people at the cancer center who are sicker physically than he is, often due to their advanced age.  He continues to exercise even when he does not want to, knowing that he does not want to just crawl back in bed and not live his life.  We talked extensively about how his faith has positively impacted his coping abilities and how he uses his faith now in many situations.  This led to also processing his gratitude and the fact that he has made a conscious decision to be positive about his cancer.  He continues to feel like a burden to his mother who has to drive him everywhere since he is not allowed to drive during cancer treatments.  He reported that he feels he should be able to drive himself around.  The fact that this is an ongoing cognitive distortion was processed and we worked together to come up with a replacement thought.  He also talked about ongoing guilt over a fight he had with his father the week before father died, so he never had a chance to say he was sorry to his father.  This also was put into CBT terms and patient was challenged to come up with a replacement thought to be used when this comes to his mind.  He expressed deep gratitude for the way in which therapy is helping him to look at the events in his life differently.  CSW pointed out that he made a conscious decision to have a good attitude.  Suicidal/Homicidal: No without intent/plan  Therapist Response: Patient  is progressing AEB engaging in scheduled therapy session.  Throughout the session, CSW gave patient the opportunity to explore thoughts and feelings associated with current life situations and past/present stressors.   CSW challenged patient gently and appropriately to consider different ways of looking at  reported issues. CSW encouraged patient's expression of feelings and validated these using empathy, active listening, open body language, and unconditional positive regard.   CSW encouraged patient to schedule more therapy sessions for the future, as needed.  He is interested in being in group at least once because his next appointment is not until 2/5.     Recommendations:  Return to therapy as scheduled, continue to engage in self care behaviors, continue to journal about his feelings, think about self-forgiveness in relation to the argument he had with father before father's death 17 years ago, PHQ-9 and GAD-7 needed next session  Plan: Return again to individual therapy on 2/5  Diagnosis:  Encounter Diagnoses  Name Primary?   Major depressive disorder, recurrent episode, mild (HCC) Yes   GAD (generalized anxiety disorder)    Collaboration of Care: Psychiatrist AEB - psychiatrist and therapist both have access to notes in Epic  Patient/Guardian was advised Release of Information must be obtained prior to any record release in order to collaborate their care with an outside provider. Patient/Guardian was advised if they have not already done so to contact the registration department to sign all necessary forms in order for us  to release information regarding their care.   Consent: Patient/Guardian gives verbal consent for treatment and assignment of benefits for services provided during this visit. Patient/Guardian expressed understanding and agreed to proceed.   Cory JINNY Crest, LCSW 12/26/2023

## 2024-01-06 NOTE — Progress Notes (Signed)
 BH MD/PA/NP OP Progress Note  01/07/2024 12:47 PM Cory Gomez  MRN:  [id number]  Visit Diagnosis:    ICD-10-CM   1. Major depressive disorder, recurrent episode, mild (HCC)  F33.0 DULoxetine  (CYMBALTA ) 60 MG capsule    2. GAD (generalized anxiety disorder)  F41.1 DULoxetine  (CYMBALTA ) 60 MG capsule    3. Insomnia due to mental disorder  F51.05 eszopiclone  (LUNESTA ) 2 MG TABS tablet       [name] is a 43 y.o. male with a history of MDD, GAD, ADHD, binge eating disorder, colon cancer stage 3b in treatment, and Vit D deficiency who presented to [facility] for initial evaluation on 01/15/2023.  At initial evaluation patient reported neurovegetative symptoms of depression including low mood, anhedonia, amotivation, worthlessness, insomnia, increased appetite, and occasional passive SI.  He denied any intent or plan.  Patient was able to complete a safety plan and lists his mother as a good support.  Patient furthermore endorsed symptoms of anxiety including feeling nervous or on edge, being unable to stop or control his worrying, difficulty relaxing, increased irritability, restlessness, and fears that something awful might happen.  Psychosocially patient has increased stress from financial struggles, difficulty at work, and interpersonal struggles in his marriage.  Patient meets criteria for MDD and generalized anxiety disorder with further clarity needed to rule out ADHD.  He was referred for neuropsych testing.  Of note due to patient's sleep troubles and body habitus there is some concern for sleep apnea and he was referred to a sleep specialist.  Cory Gomez presents for follow-up evaluation. Today, 01/07/24, patient reports that his mood has been stable overall and that insomnia has improved by taking Lunesta  more consistently.  Of note patient can still have fluctuations of low mood with increased ruminations and  anxiety.  These frequently occur when patient is unable to do a task he had been in the past.  He endorses feelings of being a burden on others.  He denies any SI or thoughts of self-harm though has had thoughts about discontinuing chemotherapy.  Currently however these thoughts are more fleeting and patient does plan to continue forward with his treatment.  We will continue on his current regimen and follow-up in a month.  Psychotherapeutic interventions were used with this patient including report of interventions and CBT techniques such as cognitive reframing.  The port of interventions were primarily related to patient's ongoing chemotherapy and helping to process some of his feelings related to this.  The cognitive reframing was merrily around patient's worry/thoughts of being a burden towards his family. Psychotherapeutic interventions were employed from 9:42 AM until 9:59 AM.    Plan: - Continue Cymbalta  to 60 mg BID - Continue Wellbutrin  XL 150 mg every day - Conitnue propranolol  10 mg BID prn for anxiety - Continue Lunesta  2 mg QHS as needed for insomnia - Neuropsych referral - Sleep specialist appointment delayed until cancer piece is resolved  - Continue therapy with Elgie Crest twice a month - Going to support groups for people with cancer - Crisis resources reviewed - Follow up in 1 month  Chief Complaint:  Chief Complaint  Patient presents with   Follow-up   HPI: Cory Gomez presents reporting that he doing alright. He did eat more around the holidays which is nice, though has to be careful about what he eats. If he eats something greasy he can get nausea. This is the worst feeling he has  noticed from the cancer treatment, followed by the fatigue.  Sleep has been better. He has been using the Lunesta  a bit more now. There have been a couple days where he used it and still could not sleep. Those night he was experiencing racing thoughts. After the fact he is able to look back  at these thoughts and realized he was ruminating. He is able to move past them without issue the next day. He also notes that he does take a 2-3 hour nap every day.   Mentally he feels like he is doing fine. There are good days and bad days. The negative days are less frequent and he is able to move past it. He still does note some ambivalence about the treatment and can think about the idea of following gods plan.  If explained this as him questioning whether he should treat the cancer at all.  He noted that he is doing so because his family wants him to when he had made them that promise.  At the same time though he worries that he is becoming a burden on those he cares about for instance his mother who drives him to his appointments.  Cory Gomez reports seeing the other people in the treatment centers and how he worries of becoming like them.  The idea of being a burden and not being able to take care of things like he would have in the past can be thing that he ruminates on.  One recent example was the snowstorm that occurred and how he had been unable to leave the house without what he considered to be excessive amounts of clothing.  Despite this however patient does remain upbeat and is often able to use humor as a defense mechanism.  He is open to continuing forward with his current chemotherapy regimen.  Past Psychiatric History: First episode of depression was in early elementary school after his parents divorced. He was in counseling and prescribed bupropion  then. Later, as an adult, he was briefly on fluoxetine with no clear benefit. While in school he was diagnosed with having ADHD inattentive type and was on Ritalin. He had seen Dr. Lynnetta from 2020-2022. He has no hx of inpatient psychiatric admissions, no hx of mania or overt psychosis. Denies any prior suicide attempts.   Has tried Adderall , Vyvanse , Ritalin, bupropion , Prozac, Zoloft , Cymbalta , trazodone , Zolpidem , topirimate, Lunesta    He  does not abuse alcohol or street drugs.  Past Medical History:  Past Medical History:  Diagnosis Date   ADHD (attention deficit hyperactivity disorder)    Annual physical exam 09/09/2019   Anxiety and depression    Cancer (HCC)    Colon cancer (HCC)    COVID-19    12/28/19   Depression, recurrent (HCC) 09/09/2019   Headache 12/31/2021   History of kidney stones    Hypertriglyceridemia 07/17/2021   Insomnia    Inverted nipple 09/16/2019   Kidney stone    Mastoiditis of both sides    Nystagmus 01/22/2022   Obesity (BMI 30-39.9)    Obesity (BMI 30-39.9)    Suicidal ideation 07/14/2021   Vertigo     Past Surgical History:  Procedure Laterality Date   COLON RESECTION  09/25/2023   PORTACATH PLACEMENT Right 10/17/2023   Procedure: INSERTION PORT-A-CATH WITH MATILDA GUIDANCE;  Surgeon: Sheldon Standing, MD;  Location: [facility];  Service: General;  Laterality: Right;   PROCTOSCOPY N/A 09/25/2023   Procedure: RIGID PROCTOSCOPY;  Surgeon: Sheldon Standing, MD;  Location: [facility];  Service: General;  Laterality: N/A;    Family History:  Family History  Problem Relation Age of Onset   Diabetes Mother    Hypertension Mother    Hyperlipidemia Mother    Diabetes Mellitus II Mother    Anxiety disorder Mother    Depression Mother    Basal cell carcinoma Mother 69 - 41   Stroke Father    Alcoholism Father    Anxiety disorder Brother    Depression Brother    Hypertension Brother    Hyperlipidemia Brother    Diabetes type II Brother    ADD / ADHD Brother    Squamous cell carcinoma Maternal Grandmother    Dementia Maternal Grandfather    Basal cell carcinoma Maternal Grandfather    Prostate cancer Maternal Grandfather 21   Lung cancer Paternal Grandfather 70       smoked   Stomach cancer Neg Hx    Colon cancer Neg Hx    Esophageal cancer Neg Hx    Pancreatic cancer Neg Hx     Social History:  Social History   Socioeconomic History   Marital status: Married     Spouse name: Not on file   Number of children: Not on file   Years of education: Not on file   Highest education level: Not on file  Occupational History   Not on file  Tobacco Use   Smoking status: Never   Smokeless tobacco: Never  Vaping Use   Vaping status: Never Used  Substance and Sexual Activity   Alcohol use: Never   Drug use: Never   Sexual activity: Yes    Partners: Female  Other Topics Concern   Not on file  Social History Narrative   Married no kids wife is Lequan Dobratz    -wife and [name] on [place]      Has siblings    Works Educational Psychologist IT [employer]   1 dog    Social Drivers of Corporate Investment Banker Strain: High Risk (10/11/2023)   Overall Financial Resource Strain (CARDIA)    Difficulty of Paying Living Expenses: Very hard  Food Insecurity: Food Insecurity Present (09/30/2023)   Hunger Vital Sign    Worried About Running Out of Food in the Last Year: Sometimes true    Ran Out of Food in the Last Year: Sometimes true  Transportation Needs: No Transportation Needs (09/30/2023)   PRAPARE - Administrator, Civil Service (Medical): No    Lack of Transportation (Non-Medical): No  Physical Activity: Not on file  Stress: Not on file  Social Connections: Not on file    Allergies: No Known Allergies  Current Medications: Current Outpatient Medications  Medication Sig Dispense Refill   Acetaminophen  (TYLENOL  PO) Take 500 mg by mouth every 6 (six) hours.     buPROPion  (WELLBUTRIN  XL) 150 MG 24 hr tablet Take 1 tablet (150 mg total) by mouth every morning. 30 tablet 2   DULoxetine  (CYMBALTA ) 60 MG capsule Take 1 capsule (60 mg total) by mouth 2 (two) times daily. 60 capsule 2   eszopiclone  (LUNESTA ) 2 MG TABS tablet Take 1 tablet (2 mg total) by mouth at bedtime as needed for sleep. Take immediately before bedtime 30 tablet 1   lidocaine -prilocaine  (EMLA ) cream Apply to affected area once 30 g 3   ondansetron  (ZOFRAN -ODT) 8 MG  disintegrating tablet Take 1 tablet (8 mg total) by mouth every 8 (eight) hours as needed for nausea  or vomiting. 30 tablet 1   oxyCODONE  (OXY IR/ROXICODONE ) 5 MG immediate release tablet Take 1 tablet (5 mg total) by mouth every 6 (six) hours as needed for moderate pain, severe pain or breakthrough pain. 20 tablet 0   prochlorperazine  (COMPAZINE ) 10 MG tablet Take 1 tablet (10 mg total) by mouth every 6 (six) hours as needed for nausea or vomiting. (Patient taking differently: Take 10 mg by mouth every 6 (six) hours as needed for nausea or vomiting. Have not started) 30 tablet 1   propranolol  (INDERAL ) 10 MG tablet Take 1 tablet (10 mg total) by mouth 2 (two) times daily as needed. 60 tablet 2   No current facility-administered medications for this visit.     Psychiatric Specialty Exam: Review of Systems  There were no vitals taken for this visit.There is no height or weight on file to calculate BMI.  General Appearance: Well Groomed  Eye Contact:  Good  Speech:  Clear and Coherent and Normal Rate  Volume:  Normal  Mood:  Anxious and Euthymic  Affect:  Congruent  Thought Process:  Coherent and Goal Directed  Orientation:  Full (Time, Place, and Person)  Thought Content: Logical and Rumination   Suicidal Thoughts:  No  Homicidal Thoughts:  No  Memory:  NA  Judgement:  Fair  Insight:  Fair  Psychomotor Activity:  Normal  Concentration:  Concentration: Good  Recall:  Good  Fund of Knowledge: Fair  Language: Good  Akathisia:  NA    AIMS (if indicated): not done  Assets:  Passenger Transport Manager Vocational/Educational  ADL's:  Intact  Cognition: WNL  Sleep:  Good   Metabolic Disorder Labs: Lab Results  Component Value Date   HGBA1C 5.7 (H) 09/25/2023   MPG 116.89 09/25/2023   No results found for: PROLACTIN Lab Results  Component Value Date   CHOL 187 05/08/2023   TRIG 178.0 (H) 05/08/2023   HDL 33.80 (L) 05/08/2023   CHOLHDL 6  05/08/2023   VLDL 35.6 05/08/2023   LDLCALC 117 (H) 05/08/2023   LDLCALC 102 (H) 06/11/2019   Lab Results  Component Value Date   TSH 3.16 05/08/2023   TSH 2.91 06/14/2021    Therapeutic Level Labs: No results found for: LITHIUM No results found for: VALPROATE No results found for: CBMZ   Screenings: GAD-7    Flowsheet Row Office Visit from 07/18/2023 in [facility] Visit from 05/29/2023 in [facility] Visit from 03/12/2023 in [facility] Visit from 01/15/2023 in [facility] Office Visit from 12/03/2022 in [facility]  Total GAD-7 Score 13 6 8 18 20       [employer]    Flowsheet Row Office Visit from 07/18/2023 in [facility] Visit from 05/29/2023 in [facility] Visit from 03/12/2023 in [facility] Visit from 01[facility]15/2023 in [facility] Office Visit from 12/03/2022 in [facility] Total Score 2 2 2 5 5   PHQ-9 Total Score 7 10 5 19 23       Flowsheet Row Admission (Discharged) from 10/17/2023 in [facility] Admission (Discharged) from 09/25/2023 in [facility] Pre-Admission Testing 60 from 09/18/2023 in [facility]  C-SSRS RISK CATEGORY No Risk  No Risk No Risk       Collaboration of Care: Collaboration of Care: Medication Management AEB medication prescription, Other provider involved in patient's care AEB oncology chart review, and Referral or follow-up with counselor/therapist AEB chart review  Patient/Guardian was advised Release of Information must be obtained prior to any record  release in order to collaborate their care with an outside provider. Patient/Guardian was advised if they have not already done so to contact the registration department to sign all necessary forms in order for us  to release information regarding their care.   Consent: Patient/Guardian gives verbal consent for treatment and assignment of benefits for services provided during this visit. Patient/Guardian expressed understanding and agreed to proceed.    Arvella CHRISTELLA Finder, MD 01/07/2024, 12:47 PM   Virtual Visit via Video Note  I connected with Cory Gomez on 01/07/24 at  9:30 AM EST by a video enabled telemedicine application and verified that I am speaking with the correct person using two identifiers.  Location: Patient: Home Provider: Home Office   I discussed the limitations of evaluation and management by telemedicine and the availability of in person appointments. The patient expressed understanding and agreed to proceed.   I discussed the assessment and treatment plan with the patient. The patient was provided an opportunity to ask questions and all were answered. The patient agreed with the plan and demonstrated an understanding of the instructions.   The patient was advised to call back or seek an in-person evaluation if the symptoms worsen or if the condition fails to improve as anticipated.  I provided 30 minutes of non-face-to-face time during this encounter.   Arvella CHRISTELLA Finder, MD

## 2024-01-06 NOTE — Assessment & Plan Note (Signed)
-  pT4aN1M0 stage IIIB, MMS, G2 -Presented with perforated diverticulitis in sigmoid colon in July, 2024. -Patient underwent laparoscopic resection of sigmoid colon, path reviewed moderate differentiated adenocarcinoma with 1 positive lymph nodes. -I recommend adjuvant chemotherapy FOLFOX for 6 months if he can tolerate.He started on 10/23/2023

## 2024-01-07 ENCOUNTER — Telehealth (HOSPITAL_COMMUNITY): Payer: BC Managed Care – PPO | Admitting: Psychiatry

## 2024-01-07 ENCOUNTER — Encounter (HOSPITAL_COMMUNITY): Payer: Self-pay | Admitting: Psychiatry

## 2024-01-07 DIAGNOSIS — F330 Major depressive disorder, recurrent, mild: Secondary | ICD-10-CM

## 2024-01-07 DIAGNOSIS — F411 Generalized anxiety disorder: Secondary | ICD-10-CM

## 2024-01-07 DIAGNOSIS — F99 Mental disorder, not otherwise specified: Secondary | ICD-10-CM

## 2024-01-07 DIAGNOSIS — F5105 Insomnia due to other mental disorder: Secondary | ICD-10-CM | POA: Diagnosis not present

## 2024-01-07 MED ORDER — ESZOPICLONE 2 MG PO TABS: 2.0000 mg | ORAL_TABLET | Freq: Every evening | ORAL | 1 refills | Status: DC | PRN

## 2024-01-07 MED ORDER — DULOXETINE HCL 60 MG PO CPEP: 60.0000 mg | ORAL_CAPSULE | Freq: Two times a day (BID) | ORAL | 2 refills | Status: DC

## 2024-01-08 ENCOUNTER — Other Ambulatory Visit: Payer: Self-pay

## 2024-01-08 ENCOUNTER — Inpatient Hospital Stay: Payer: BC Managed Care – PPO

## 2024-01-08 ENCOUNTER — Encounter: Payer: Self-pay | Admitting: Hematology

## 2024-01-08 ENCOUNTER — Encounter (HOSPITAL_COMMUNITY): Payer: Self-pay | Admitting: Clinical

## 2024-01-08 ENCOUNTER — Encounter (HOSPITAL_COMMUNITY): Payer: Self-pay

## 2024-01-08 ENCOUNTER — Observation Stay (HOSPITAL_COMMUNITY): Payer: BC Managed Care – PPO

## 2024-01-08 ENCOUNTER — Emergency Department (HOSPITAL_COMMUNITY): Payer: BC Managed Care – PPO

## 2024-01-08 ENCOUNTER — Observation Stay (HOSPITAL_COMMUNITY)
Admission: EM | Admit: 2024-01-08 | Discharge: 2024-01-09 | Disposition: A | Discharge status: Home / Self Care | Payer: BC Managed Care – PPO | Attending: Internal Medicine | Admitting: Internal Medicine

## 2024-01-08 ENCOUNTER — Ambulatory Visit (INDEPENDENT_AMBULATORY_CARE_PROVIDER_SITE_OTHER): Payer: BC Managed Care – PPO | Admitting: Clinical

## 2024-01-08 ENCOUNTER — Inpatient Hospital Stay (HOSPITAL_BASED_OUTPATIENT_CLINIC_OR_DEPARTMENT_OTHER): Payer: BC Managed Care – PPO | Admitting: Hematology

## 2024-01-08 VITALS — BP 158/103 | HR 86 | Resp 18

## 2024-01-08 VITALS — BP 148/100 | HR 96 | Temp 97.0°F | Resp 16 | Wt 294.6 lb

## 2024-01-08 DIAGNOSIS — D709 Neutropenia, unspecified: Secondary | ICD-10-CM | POA: Diagnosis not present

## 2024-01-08 DIAGNOSIS — R29898 Other symptoms and signs involving the musculoskeletal system: Secondary | ICD-10-CM | POA: Diagnosis not present

## 2024-01-08 DIAGNOSIS — F330 Major depressive disorder, recurrent, mild: Secondary | ICD-10-CM

## 2024-01-08 DIAGNOSIS — D509 Iron deficiency anemia, unspecified: Secondary | ICD-10-CM | POA: Insufficient documentation

## 2024-01-08 DIAGNOSIS — C187 Malignant neoplasm of sigmoid colon: Secondary | ICD-10-CM

## 2024-01-08 DIAGNOSIS — F32A Depression, unspecified: Secondary | ICD-10-CM | POA: Insufficient documentation

## 2024-01-08 DIAGNOSIS — D500 Iron deficiency anemia secondary to blood loss (chronic): Secondary | ICD-10-CM | POA: Diagnosis not present

## 2024-01-08 DIAGNOSIS — R531 Weakness: Secondary | ICD-10-CM | POA: Diagnosis not present

## 2024-01-08 DIAGNOSIS — R42 Dizziness and giddiness: Secondary | ICD-10-CM | POA: Diagnosis not present

## 2024-01-08 DIAGNOSIS — F411 Generalized anxiety disorder: Secondary | ICD-10-CM

## 2024-01-08 DIAGNOSIS — F419 Anxiety disorder, unspecified: Secondary | ICD-10-CM | POA: Insufficient documentation

## 2024-01-08 DIAGNOSIS — Z79899 Other long term (current) drug therapy: Secondary | ICD-10-CM | POA: Insufficient documentation

## 2024-01-08 DIAGNOSIS — Z95828 Presence of other vascular implants and grafts: Secondary | ICD-10-CM

## 2024-01-08 LAB — COMPREHENSIVE METABOLIC PANEL
ALT: 34 U/L (ref 0–44)
AST: 36 U/L (ref 15–41)
Albumin: 4.1 g/dL (ref 3.5–5.0)
Alkaline Phosphatase: 69 U/L (ref 38–126)
Anion gap: 9 (ref 5–15)
BUN: 10 mg/dL (ref 6–20)
CO2: 23 mmol/L (ref 22–32)
Calcium: 9.2 mg/dL (ref 8.9–10.3)
Chloride: 103 mmol/L (ref 98–111)
Creatinine, Ser: 0.75 mg/dL (ref 0.61–1.24)
GFR, Estimated: >60 mL/min (ref >60)
Glucose, Bld: 114 mg/dL — ABNORMAL HIGH (ref 70–99)
Potassium: 3.9 mmol/L (ref 3.5–5.1)
Sodium: 135 mmol/L (ref 135–145)
Total Bilirubin: 0.4 mg/dL (ref 0.0–1.2)
Total Protein: 7.6 g/dL (ref 6.5–8.1)

## 2024-01-08 LAB — CMP (CANCER CENTER ONLY)
ALT: 29 U/L (ref 0–44)
AST: 26 U/L (ref 15–41)
Albumin: 4.2 g/dL (ref 3.5–5.0)
Alkaline Phosphatase: 67 U/L (ref 38–126)
Anion gap: 5 (ref 5–15)
BUN: 9 mg/dL (ref 6–20)
CO2: 27 mmol/L (ref 22–32)
Calcium: 9 mg/dL (ref 8.9–10.3)
Chloride: 107 mmol/L (ref 98–111)
Creatinine: 0.72 mg/dL (ref 0.61–1.24)
GFR, Estimated: >60 mL/min (ref >60)
Glucose, Bld: 110 mg/dL — ABNORMAL HIGH (ref 70–99)
Potassium: 3.6 mmol/L (ref 3.5–5.1)
Sodium: 139 mmol/L (ref 135–145)
Total Bilirubin: 0.3 mg/dL (ref 0.0–1.2)
Total Protein: 7.1 g/dL (ref 6.5–8.1)

## 2024-01-08 LAB — LIPID PANEL
Cholesterol: 158 mg/dL (ref 0–200)
HDL: 39 mg/dL — ABNORMAL LOW (ref >40)
LDL Cholesterol: 46 mg/dL (ref 0–99)
Total CHOL/HDL Ratio: 4.1 {ratio}
Triglycerides: 363 mg/dL — ABNORMAL HIGH (ref <150)
VLDL: 73 mg/dL — ABNORMAL HIGH (ref 0–40)

## 2024-01-08 LAB — CBC
HCT: 36.8 % — ABNORMAL LOW (ref 39.0–52.0)
Hemoglobin: 11.6 g/dL — ABNORMAL LOW (ref 13.0–17.0)
MCH: 26.7 pg (ref 26.0–34.0)
MCHC: 31.5 g/dL (ref 30.0–36.0)
MCV: 84.8 fL (ref 80.0–100.0)
Platelets: 287 10*3/uL (ref 150–400)
RBC: 4.34 MIL/uL (ref 4.22–5.81)
RDW: 21.4 % — ABNORMAL HIGH (ref 11.5–15.5)
WBC: 3.5 10*3/uL — ABNORMAL LOW (ref 4.0–10.5)
nRBC: 0 % (ref 0.0–0.2)

## 2024-01-08 LAB — HEMOGLOBIN A1C
Hgb A1c MFr Bld: 5.8 % — ABNORMAL HIGH (ref 4.8–5.6)
Mean Plasma Glucose: 119.76 mg/dL

## 2024-01-08 LAB — DIFFERENTIAL
Abs Immature Granulocytes: 0.1 10*3/uL — ABNORMAL HIGH (ref 0.00–0.07)
Basophils Absolute: 0 10*3/uL (ref 0.0–0.1)
Basophils Relative: 0 %
Eosinophils Absolute: 0 10*3/uL (ref 0.0–0.5)
Eosinophils Relative: 1 %
Immature Granulocytes: 3 %
Lymphocytes Relative: 33 %
Lymphs Abs: 1.1 10*3/uL (ref 0.7–4.0)
Monocytes Absolute: 0.3 10*3/uL (ref 0.1–1.0)
Monocytes Relative: 9 %
Neutro Abs: 1.9 10*3/uL (ref 1.7–7.7)
Neutrophils Relative %: 54 %

## 2024-01-08 LAB — CBC WITH DIFFERENTIAL (CANCER CENTER ONLY)
Abs Immature Granulocytes: 0.06 10*3/uL (ref 0.00–0.07)
Basophils Absolute: 0 10*3/uL (ref 0.0–0.1)
Basophils Relative: 1 %
Eosinophils Absolute: 0.1 10*3/uL (ref 0.0–0.5)
Eosinophils Relative: 2 %
HCT: 34.2 % — ABNORMAL LOW (ref 39.0–52.0)
Hemoglobin: 11.2 g/dL — ABNORMAL LOW (ref 13.0–17.0)
Immature Granulocytes: 2 %
Lymphocytes Relative: 45 %
Lymphs Abs: 1.3 10*3/uL (ref 0.7–4.0)
MCH: 26.4 pg (ref 26.0–34.0)
MCHC: 32.7 g/dL (ref 30.0–36.0)
MCV: 80.7 fL (ref 80.0–100.0)
Monocytes Absolute: 0.5 10*3/uL (ref 0.1–1.0)
Monocytes Relative: 18 %
Neutro Abs: 0.9 10*3/uL — ABNORMAL LOW (ref 1.7–7.7)
Neutrophils Relative %: 32 %
Platelet Count: 249 10*3/uL (ref 150–400)
RBC: 4.24 MIL/uL (ref 4.22–5.81)
RDW: 21.4 % — ABNORMAL HIGH (ref 11.5–15.5)
WBC Count: 2.9 10*3/uL — ABNORMAL LOW (ref 4.0–10.5)
nRBC: 0 % (ref 0.0–0.2)

## 2024-01-08 LAB — URINALYSIS, ROUTINE W REFLEX MICROSCOPIC
Bilirubin Urine: NEGATIVE
Glucose, UA: NEGATIVE mg/dL
Hgb urine dipstick: NEGATIVE
Ketones, ur: NEGATIVE mg/dL
Leukocytes,Ua: NEGATIVE
Nitrite: NEGATIVE
Protein, ur: NEGATIVE mg/dL
Specific Gravity, Urine: 1.01 (ref 1.005–1.030)
pH: 6 (ref 5.0–8.0)

## 2024-01-08 LAB — I-STAT CHEM 8, ED
BUN: 8 mg/dL (ref 6–20)
Calcium, Ion: 1.12 mmol/L — ABNORMAL LOW (ref 1.15–1.40)
Chloride: 105 mmol/L (ref 98–111)
Creatinine, Ser: 0.7 mg/dL (ref 0.61–1.24)
Glucose, Bld: 115 mg/dL — ABNORMAL HIGH (ref 70–99)
HCT: 34 % — ABNORMAL LOW (ref 39.0–52.0)
Hemoglobin: 11.6 g/dL — ABNORMAL LOW (ref 13.0–17.0)
Potassium: 4 mmol/L (ref 3.5–5.1)
Sodium: 137 mmol/L (ref 135–145)
TCO2: 20 mmol/L — ABNORMAL LOW (ref 22–32)

## 2024-01-08 LAB — VITAMIN D 25 HYDROXY (VIT D DEFICIENCY, FRACTURES): Vit D, 25-Hydroxy: 18.99 ng/mL — ABNORMAL LOW (ref 30–100)

## 2024-01-08 LAB — RAPID URINE DRUG SCREEN, HOSP PERFORMED
Amphetamines: NOT DETECTED
Barbiturates: NOT DETECTED
Benzodiazepines: NOT DETECTED
Cocaine: NOT DETECTED
Opiates: NOT DETECTED
Tetrahydrocannabinol: NOT DETECTED

## 2024-01-08 LAB — PROTIME-INR
INR: 1 (ref 0.8–1.2)
Prothrombin Time: 13.6 s (ref 11.4–15.2)

## 2024-01-08 LAB — TSH: TSH: 1.16 u[IU]/mL (ref 0.350–4.500)

## 2024-01-08 LAB — TYPE AND SCREEN
ABO/RH(D): O POS
Antibody Screen: NEGATIVE

## 2024-01-08 LAB — APTT: aPTT: 29 s (ref 24–36)

## 2024-01-08 LAB — VITAMIN B12: Vitamin B-12: 428 pg/mL (ref 180–914)

## 2024-01-08 LAB — ETHANOL: Alcohol, Ethyl (B): <10 mg/dL (ref <10)

## 2024-01-08 LAB — CBG MONITORING, ED: Glucose-Capillary: 113 mg/dL — ABNORMAL HIGH (ref 70–99)

## 2024-01-08 MED ORDER — FLUOROURACIL CHEMO INJECTION 2.5 GM/50ML
400.0000 mg/m2 | Freq: Once | INTRAVENOUS | Status: DC
Start: 2024-01-08 — End: 2024-01-08
  Filled 2024-01-08: qty 20

## 2024-01-08 MED ORDER — ACETAMINOPHEN 325 MG PO TABS: 650.0000 mg | ORAL_TABLET | Freq: Four times a day (QID) | ORAL | Status: DC | PRN

## 2024-01-08 MED ORDER — CALCIUM GLUCONATE-NACL 2-0.675 GM/100ML-% IV SOLN
2.0000 g | INTRAVENOUS | Status: AC
  Administered 2024-01-08: 2000 mg via INTRAVENOUS
  Filled 2024-01-08: qty 100

## 2024-01-08 MED ORDER — ZOLPIDEM TARTRATE 5 MG PO TABS: 5.0000 mg | ORAL_TABLET | Freq: Every day | ORAL | Status: DC

## 2024-01-08 MED ORDER — PROCHLORPERAZINE EDISYLATE 10 MG/2ML IJ SOLN: 10.0000 mg | INTRAMUSCULAR | Status: DC | PRN

## 2024-01-08 MED ORDER — SODIUM CHLORIDE 0.9 % IV SOLN
2400.0000 mg/m2 | INTRAVENOUS | Status: DC
  Filled 2024-01-08: qty 122

## 2024-01-08 MED ORDER — TENECTEPLASE FOR STROKE
25.0000 mg | PACK | Freq: Once | INTRAVENOUS | Status: DC
  Filled 2024-01-08: qty 5

## 2024-01-08 MED ORDER — OXYCODONE HCL 5 MG PO TABS: 5.0000 mg | ORAL_TABLET | Freq: Four times a day (QID) | ORAL | Status: DC | PRN

## 2024-01-08 MED ORDER — LABETALOL HCL 5 MG/ML IV SOLN: 10.0000 mg | INTRAVENOUS | Status: DC | PRN

## 2024-01-08 MED ORDER — SODIUM CHLORIDE 0.9% FLUSH: 10.0000 mL | INTRAVENOUS | Status: DC | PRN

## 2024-01-08 MED ORDER — DEXTROSE 5 % IV SOLN: INTRAVENOUS | Status: DC

## 2024-01-08 MED ORDER — LORAZEPAM 2 MG/ML IJ SOLN
1.0000 mg | Freq: Once | INTRAMUSCULAR | Status: AC
  Administered 2024-01-08: 1 mg via INTRAVENOUS
  Filled 2024-01-08: qty 1

## 2024-01-08 MED ORDER — ONDANSETRON HCL 4 MG/2ML IJ SOLN: 4.0000 mg | Freq: Four times a day (QID) | INTRAMUSCULAR | Status: DC | PRN

## 2024-01-08 MED ORDER — SODIUM CHLORIDE 0.9 % IV SOLN: INTRAVENOUS | Status: AC

## 2024-01-08 MED ORDER — HEPARIN SOD (PORK) LOCK FLUSH 100 UNIT/ML IV SOLN: 500.0000 [IU] | Freq: Once | INTRAVENOUS | Status: DC | PRN

## 2024-01-08 MED ORDER — GUAIFENESIN 100 MG/5ML PO LIQD: 5.0000 mL | ORAL | Status: DC | PRN

## 2024-01-08 MED ORDER — PALONOSETRON HCL INJECTION 0.25 MG/5ML
0.2500 mg | Freq: Once | INTRAVENOUS | Status: AC
Start: 2024-01-08 — End: 2024-01-08
  Administered 2024-01-08: 0.25 mg via INTRAVENOUS
  Filled 2024-01-08: qty 5

## 2024-01-08 MED ORDER — ENOXAPARIN SODIUM 80 MG/0.8ML IJ SOSY
70.0000 mg | PREFILLED_SYRINGE | Freq: Every day | INTRAMUSCULAR | Status: DC
  Administered 2024-01-08: 70 mg via SUBCUTANEOUS
  Filled 2024-01-08: qty 0.7
  Filled 2024-01-08: qty 0.8

## 2024-01-08 MED ORDER — IOHEXOL 350 MG/ML SOLN
80.0000 mL | Freq: Once | INTRAVENOUS | Status: AC | PRN
  Administered 2024-01-08: 80 mL via INTRAVENOUS

## 2024-01-08 MED ORDER — MELATONIN 5 MG PO TABS
5.0000 mg | ORAL_TABLET | Freq: Every day | ORAL | Status: DC
  Administered 2024-01-08: 5 mg via ORAL
  Filled 2024-01-08: qty 1

## 2024-01-08 MED ORDER — SODIUM CHLORIDE 0.9% FLUSH
10.0000 mL | INTRAVENOUS | Status: DC | PRN
Start: 2024-01-08 — End: 2024-01-08
  Administered 2024-01-08: 10 mL

## 2024-01-08 MED ORDER — IPRATROPIUM-ALBUTEROL 0.5-2.5 (3) MG/3ML IN SOLN: 3.0000 mL | RESPIRATORY_TRACT | Status: DC | PRN

## 2024-01-08 MED ORDER — SODIUM CHLORIDE (PF) 0.9 % IJ SOLN
INTRAMUSCULAR | Status: AC
  Filled 2024-01-08: qty 50

## 2024-01-08 MED ORDER — DEXTROSE 5 % IV SOLN
400.0000 mg/m2 | Freq: Once | INTRAVENOUS | Status: AC
  Administered 2024-01-08: 1020 mg via INTRAVENOUS
  Filled 2024-01-08: qty 51

## 2024-01-08 MED ORDER — DEXAMETHASONE SODIUM PHOSPHATE 10 MG/ML IJ SOLN
10.0000 mg | Freq: Once | INTRAMUSCULAR | Status: AC
  Administered 2024-01-08: 10 mg via INTRAVENOUS
  Filled 2024-01-08: qty 1

## 2024-01-08 MED ORDER — HYDRALAZINE HCL 20 MG/ML IJ SOLN: 10.0000 mg | INTRAMUSCULAR | Status: DC | PRN

## 2024-01-08 MED ORDER — BUPROPION HCL ER (XL) 150 MG PO TB24
150.0000 mg | ORAL_TABLET | Freq: Every day | ORAL | Status: DC
  Administered 2024-01-09: 150 mg via ORAL
  Filled 2024-01-08: qty 1

## 2024-01-08 MED ORDER — OXALIPLATIN CHEMO INJECTION 100 MG/20ML
85.0000 mg/m2 | Freq: Once | INTRAVENOUS | Status: AC
  Administered 2024-01-08: 200 mg via INTRAVENOUS
  Filled 2024-01-08: qty 40

## 2024-01-08 MED ORDER — DULOXETINE HCL 30 MG PO CPEP
60.0000 mg | ORAL_CAPSULE | Freq: Two times a day (BID) | ORAL | Status: DC
  Administered 2024-01-08 – 2024-01-09 (×2): 60 mg via ORAL
  Filled 2024-01-08 (×3): qty 2

## 2024-01-08 MED ORDER — SENNOSIDES-DOCUSATE SODIUM 8.6-50 MG PO TABS: 1.0000 | ORAL_TABLET | Freq: Every evening | ORAL | Status: DC | PRN

## 2024-01-08 NOTE — ED Notes (Signed)
 ED TO INPATIENT HANDOFF REPORT  ED Nurse Name and Phone #: Arliss Benton Name/Age/Gender Cory Gomez 43 y.o. male Room/Bed: WA15/WA15  Code Status   Code Status: Full Code  Home/SNF/Other Home Patient oriented to: self, place, time, and situation Is this baseline? Yes   Triage Complete: Triage complete  Chief Complaint Weakness of right upper extremity [R29.898]  Triage Note New onset dizziness. Right sided weakness. LNW was 1 hr ago. Hx of sigmoid colon. Was at cancer center being treated with oxaliplatin . Decadron  and aloxi  given today. BP 158/103 HR 85 O2 100% RA   Allergies Allergies  Allergen Reactions   Ambien  [Zolpidem ] Other (See Comments)    Pt requests we do not give this medication.     Level of Care/Admitting Diagnosis ED Disposition     ED Disposition  Admit   Condition  --   Comment  Hospital Area: [facility] [[id number]]  Level of Care: Telemetry [5]  Admit to tele based on following criteria: Monitor for Ischemic changes  May place patient in observation at [facility] or [facility] if equivalent level of care is available:: Yes  Covid Evaluation: Confirmed COVID Negative  Diagnosis: Weakness of right upper extremity [161096]  Admitting Physician: Maggie Schooner [[id number]]  Attending Physician: Maggie Schooner [[id number]]          B Medical/Surgery History Past Medical History:  Diagnosis Date   ADHD (attention deficit hyperactivity disorder)    Annual physical exam 09/09/2019   Anxiety and depression    Cancer (HCC)    Colon cancer (HCC)    COVID-19    12/28/19   Depression, recurrent (HCC) 09/09/2019   Headache 12/31/2021   History of kidney stones    Hypertriglyceridemia 07/17/2021   Insomnia    Inverted nipple 09/16/2019   Kidney stone    Mastoiditis of both sides    Nystagmus 01/22/2022   Obesity (BMI 30-39.9)    Obesity (BMI 30-39.9)    Suicidal ideation 07/14/2021   Vertigo    Past Surgical History:   Procedure Laterality Date   COLON RESECTION  09/25/2023   PORTACATH PLACEMENT Right 10/17/2023   Procedure: INSERTION PORT-A-CATH WITH Kim Pen GUIDANCE;  Surgeon: Candyce Champagne, MD;  Location: [facility];  Service: General;  Laterality: Right;   PROCTOSCOPY N/A 09/25/2023   Procedure: RIGID PROCTOSCOPY;  Surgeon: Candyce Champagne, MD;  Location: [facility];  Service: General;  Laterality: N/A;     A IV Location/Drains/Wounds Patient Lines/Drains/Airways Status     Active Line/Drains/Airways     Name Placement date Placement time Site Days   Implanted Port Right Chest --  --  Chest  --   Peripheral IV 01/08/24 20 G 1.88" Left Antecubital 01/08/24  1400  Antecubital  less than 1   Incision - 5 Ports Abdomen 1: Upper 2: Right 3: Right;Lower 4: Lower 5: Right;Lateral;Lower 09/25/23  0940  -- 105            Intake/Output Last 24 hours No intake or output data in the 24 hours ending 01/08/24 1953  Labs/Imaging Results for orders placed or performed during the hospital encounter of 01/08/24 (from the past 48 hours)  CBG monitoring, ED     Status: Abnormal   Collection Time: 01/08/24  1:31 PM  Result Value Ref Range   Glucose-Capillary 113 (H) 70 - 99 mg/dL    Comment: Glucose reference range applies only to samples taken after fasting for at least 8  hours.  Ethanol     Status: None   Collection Time: 01/08/24  1:33 PM  Result Value Ref Range   Alcohol, Ethyl (B) <10 <10 mg/dL    Comment: (NOTE) Lowest detectable limit for serum alcohol is 10 mg/dL.  For medical purposes only. Performed at [facility], [address] [place], [place] [address]   Protime-INR     Status: None   Collection Time: 01/08/24  1:33 PM  Result Value Ref Range   Prothrombin Time 13.6 11.4 - 15.2 seconds   INR 1.0 0.8 - 1.2    Comment: (NOTE) INR goal varies based on device and disease states. Performed at [facility], [address] [place], [place] [address]   APTT     Status: None   Collection Time: 01/08/24  1:33 PM  Result Value Ref Range   aPTT 29 24 - 36 seconds    Comment: Performed at [facility], [address], [place], [place] [address]  CBC     Status: Abnormal   Collection Time: 01/08/24  1:33 PM  Result Value Ref Range   WBC 3.5 (L) 4.0 - 10.5 K/uL   RBC 4.34 4.22 - 5.81 MIL/uL   Hemoglobin 11.6 (L) 13.0 - 17.0 g/dL   HCT 13.2 (L) 44.0 - 10.2 %   MCV 84.8 80.0 - 100.0 fL   MCH 26.7 26.0 - 34.0 pg   MCHC 31.5 30.0 - 36.0 g/dL   RDW 72.5 (H) 36.6 - 44.0 %   Platelets 287 150 - 400 K/uL   nRBC 0.0 0.0 - 0.2 %    Comment: Performed at [facility], [address] [place], [place] [address]  Differential     Status: Abnormal   Collection Time: 01/08/24  1:33 PM  Result Value Ref Range   Neutrophils Relative % 54 %   Neutro Abs 1.9 1.7 - 7.7 K/uL   Lymphocytes Relative 33 %   Lymphs Abs 1.1 0.7 - 4.0 K/uL   Monocytes Relative 9 %   Monocytes Absolute 0.3 0.1 - 1.0 K/uL   Eosinophils Relative 1 %   Eosinophils Absolute 0.0 0.0 - 0.5 K/uL   Basophils Relative 0 %   Basophils Absolute 0.0 0.0 - 0.1 K/uL   Immature Granulocytes 3 %   Abs Immature Granulocytes 0.10 (H) 0.00 - 0.07 K/uL   Polychromasia PRESENT     Comment: Performed at [facility], [address], [place], [place] [address]  Comprehensive metabolic panel     Status: Abnormal   Collection Time: 01/08/24  1:33 PM  Result Value Ref Range   Sodium 135 135 - 145 mmol/L   Potassium 3.9 3.5 - 5.1 mmol/L   Chloride 103 98 - 111 mmol/L   CO2 23 22 - 32 mmol/L   Glucose, Bld 114 (H) 70 - 99 mg/dL    Comment: Glucose reference range applies only to samples taken after fasting for at least 8 hours.   BUN 10 6 - 20 mg/dL   Creatinine, Ser 8.75 0.61 - 1.24 mg/dL   Calcium  9.2 8.9 - 10.3 mg/dL   Total Protein 7.6 6.5 - 8.1 g/dL   Albumin 4.1 3.5 - 5.0 g/dL   AST 36 15 - 41 U/L    ALT 34 0 - 44 U/L   Alkaline Phosphatase 69 38 - 126 U/L   Total Bilirubin 0.4 0.0 - 1.2 mg/dL   GFR, Estimated >64 >33  mL/min    Comment: (NOTE) Calculated using the CKD-EPI Creatinine Equation (2021)    Anion gap 9 5 - 15    Comment: Performed at [facility], [address] [place], [place] [address]  Lipid panel     Status: Abnormal   Collection Time: 01/08/24  1:33 PM  Result Value Ref Range   Cholesterol 158 0 - 200 mg/dL   Triglycerides 604 (H) <150 mg/dL   HDL 39 (L) >54 mg/dL   Total CHOL/HDL Ratio 4.1 RATIO   VLDL 73 (H) 0 - 40 mg/dL   LDL Cholesterol 46 0 - 99 mg/dL    Comment:        Total Cholesterol/HDL:CHD Risk Coronary Heart Disease Risk Table                     Men   Women  1/2 Average Risk   3.4   3.3  Average Risk       5.0   4.4  2 X Average Risk   9.6   7.1  3 X Average Risk  23.4   11.0        Use the calculated Patient Ratio above and the CHD Risk Table to determine the patient's CHD Risk.        ATP III CLASSIFICATION (LDL):  <100     mg/dL   Optimal  098-119  mg/dL   Near or Above                    Optimal  130-159  mg/dL   Borderline  147-829  mg/dL   High  >562     mg/dL   Very High Performed at [facility], [address] [place], [place] [address]   Hemoglobin A1c     Status: Abnormal   Collection Time: 01/08/24  1:33 PM  Result Value Ref Range   Hgb A1c MFr Bld 5.8 (H) 4.8 - 5.6 %    Comment: (NOTE) Pre diabetes:          5.7%-6.4%  Diabetes:              >6.4%  Glycemic control for   <7.0% adults with diabetes    Mean Plasma Glucose 119.76 mg/dL    Comment: Performed at [facility], [address] [place], [place] [address]  Vitamin B12     Status: None   Collection Time: 01/08/24  1:33 PM  Result Value Ref Range   Vitamin B-12 428 180 - 914 pg/mL    Comment: (NOTE) This assay is not validated for testing neonatal or myeloproliferative syndrome specimens for Vitamin B12  levels. Performed at [facility], [address], [place], [place] [address]   Merrilee Abernethy 8, ED     Status: Abnormal   Collection Time: 01/08/24  1:40 PM  Result Value Ref Range   Sodium 137 135 - 145 mmol/L   Potassium 4.0 3.5 - 5.1 mmol/L   Chloride 105 98 - 111 mmol/L   BUN 8 6 - 20 mg/dL   Creatinine, Ser 2.84 0.61 - 1.24 mg/dL   Glucose, Bld 132 (H) 70 - 99 mg/dL    Comment: Glucose reference range applies only to samples taken after fasting for at least 8 hours.   Calcium , Ion 1.12 (L) 1.15 - 1.40 mmol/L   TCO2 20 (L) 22 - 32 mmol/L   Hemoglobin 11.6 (L) 13.0 - 17.0 g/dL  HCT 34.0 (L) 39.0 - 52.0 %  Type and screen [facility]     Status: None   Collection Time: 01/08/24  2:33 PM  Result Value Ref Range   ABO/RH(D) O POS    Antibody Screen NEG    Sample Expiration      01/11/2024,2359 Performed at [facility], [address] [place], [place] [address]   Urine rapid drug screen (hosp performed)     Status: None   Collection Time: 01/08/24  2:47 PM  Result Value Ref Range   Opiates NONE DETECTED NONE DETECTED   Cocaine NONE DETECTED NONE DETECTED   Benzodiazepines NONE DETECTED NONE DETECTED   Amphetamines NONE DETECTED NONE DETECTED   Tetrahydrocannabinol NONE DETECTED NONE DETECTED   Barbiturates NONE DETECTED NONE DETECTED    Comment: (NOTE) DRUG SCREEN FOR MEDICAL PURPOSES ONLY.  IF CONFIRMATION IS NEEDED FOR ANY PURPOSE, NOTIFY LAB WITHIN 5 DAYS.  LOWEST DETECTABLE LIMITS FOR URINE DRUG SCREEN Drug Class                     Cutoff (ng/mL) Amphetamine  and metabolites    1000 Barbiturate and metabolites    200 Benzodiazepine                 200 Opiates and metabolites        300 Cocaine and metabolites        300 THC                            50 Performed at [facility], [address], [place], [place] [address]   Urinalysis, Routine w reflex microscopic -Urine, Clean Catch      Status: Abnormal   Collection Time: 01/08/24  2:47 PM  Result Value Ref Range   Color, Urine STRAW (A) YELLOW   APPearance CLEAR CLEAR   Specific Gravity, Urine 1.010 1.005 - 1.030   pH 6.0 5.0 - 8.0   Glucose, UA NEGATIVE NEGATIVE mg/dL   Hgb urine dipstick NEGATIVE NEGATIVE   Bilirubin Urine NEGATIVE NEGATIVE   Ketones, ur NEGATIVE NEGATIVE mg/dL   Protein, ur NEGATIVE NEGATIVE mg/dL   Nitrite NEGATIVE NEGATIVE   Leukocytes,Ua NEGATIVE NEGATIVE    Comment: Performed at [facility], [address] [place], [place] [address]  TSH     Status: None   Collection Time: 01/08/24  3:34 PM  Result Value Ref Range   TSH 1.160 0.350 - 4.500 uIU/mL    Comment: Performed by a 3rd Generation assay with a functional sensitivity of <=0.01 uIU/mL. Performed at [facility], [address] [place], [place] [address]    CT ANGIO HEAD NECK W WO CM Result Date: 01/08/2024 CLINICAL DATA:  Dizziness, stroke suspected EXAM: CT ANGIOGRAPHY HEAD AND NECK WITH AND WITHOUT CONTRAST TECHNIQUE: Multidetector CT imaging of the head and neck was performed using the standard protocol during bolus administration of intravenous contrast. Multiplanar CT image reconstructions and MIPs were obtained to evaluate the vascular anatomy. Carotid stenosis measurements (when applicable) are obtained utilizing NASCET criteria, using the distal internal carotid diameter as the denominator. RADIATION DOSE REDUCTION: This exam was performed according to the departmental dose-optimization program which includes automated exposure control, adjustment of the mA and/or kV according to patient size and/or use of iterative reconstruction technique. CONTRAST:  80mL OMNIPAQUE  IOHEXOL  350 MG/ML SOLN COMPARISON:  01/08/2024 CT head, 12/30/2021 CTA head  FINDINGS: CT HEAD FINDINGS Brain: No evidence of acute infarct, hemorrhage, mass, mass effect, or midline shift. No hydrocephalus or extra-axial fluid  collection. Vascular: No hyperdense vessel. Skull: Negative for fracture or focal lesion. Sinuses/Orbits: No acute finding. CTA NECK FINDINGS Aortic arch: Standard branching. Imaged portion shows no evidence of aneurysm or dissection. No significant stenosis of the major arch vessel origins. Right carotid system: No evidence of dissection, occlusion, or hemodynamically significant stenosis (greater than 50%). Left carotid system: No evidence of dissection, occlusion, or hemodynamically significant stenosis (greater than 50%). Vertebral arteries: No evidence of dissection, occlusion, or hemodynamically significant stenosis (greater than 50%). Skeleton: No acute osseous abnormality. Degenerative changes in the cervical spine. Other neck: No acute finding. Upper chest: No focal pulmonary opacity or pleural effusion. Review of the MIP images confirms the above findings CTA HEAD FINDINGS Anterior circulation: Both internal carotid arteries are patent to the termini, without significant stenosis. A1 segments patent. Normal anterior communicating artery. Anterior cerebral arteries are patent to their distal aspects without significant stenosis. No M1 stenosis or occlusion. MCA branches perfused to their distal aspects without significant stenosis. Posterior circulation: Vertebral arteries patent to the vertebrobasilar junction without significant stenosis. Posterior inferior cerebellar arteries patent proximally. Basilar patent to its distal aspect without significant stenosis. Superior cerebellar arteries patent proximally. Patent P1 segments. PCAs perfused to their distal aspects without significant stenosis. The bilateral posterior communicating arteries are not visualized. Venous sinuses: As permitted by contrast timing, patent. Anatomic variants: None significant. No evidence of aneurysm or vascular malformation. Review of the MIP images confirms the above findings IMPRESSION: 1. No acute intracranial process. 2. No  intracranial large vessel occlusion or significant stenosis. 3. No hemodynamically significant stenosis in the neck. Electronically Signed   By: Zoila Hines M.D.   On: 01/08/2024 19:11   MR BRAIN WO CONTRAST Result Date: 01/08/2024 CLINICAL DATA:  New onset dizziness, right-sided weakness EXAM: MRI HEAD WITHOUT CONTRAST TECHNIQUE: Multiplanar, multiecho pulse sequences of the brain and surrounding structures were obtained without intravenous contrast. COMPARISON:  No prior MRI available 12/30/2021 MRI head, correlation is also made with 01/08/2024 CT head on FINDINGS: Brain: No restricted diffusion to suggest acute or subacute infarct. No acute hemorrhage, mass, mass effect, or midline shift. No hydrocephalus or extra-axial collection. Pituitary and craniocervical junction within normal limits. No hemosiderin deposition to suggest remote hemorrhage. Vascular: Normal arterial flow voids. Skull and upper cervical spine: Normal marrow signal. Sinuses/Orbits: Mucous retention cysts in the maxillary sinuses. Mucosal thickening in the ethmoid air cells. No acute finding in the orbits. Other: The mastoid air cells are well aerated. Fluid in the right petrous apex. IMPRESSION: No acute intracranial process. No evidence of acute or subacute infarct. Electronically Signed   By: Zoila Hines M.D.   On: 01/08/2024 19:08   CT HEAD CODE STROKE WO CONTRAST Result Date: 01/08/2024 CLINICAL DATA:  Code stroke.  Right-sided weakness EXAM: CT HEAD WITHOUT CONTRAST TECHNIQUE: Contiguous axial images were obtained from the base of the skull through the vertex without intravenous contrast. RADIATION DOSE REDUCTION: This exam was performed according to the departmental dose-optimization program which includes automated exposure control, adjustment of the mA and/or kV according to patient size and/or use of iterative reconstruction technique. COMPARISON:  12/29/2021 CT head FINDINGS: Brain: No evidence of acute infarction,  hemorrhage, mass, mass effect, or midline shift. No hydrocephalus or extra-axial collection. Partial empty sella. Normal craniocervical junction. Vascular: No hyperdense vessel. Skull: Negative for fracture or focal lesion. Sinuses/Orbits: Mucous  retention cysts in the maxillary sinuses. No acute finding in the orbits. Other: The mastoid air cells are well aerated. ASPECTS [facility] Stroke Program Early CT Score) - Ganglionic level infarction (caudate, lentiform nuclei, internal capsule, insula, M1-M3 cortex): 7 - Supraganglionic infarction (M4-M6 cortex): 3 Total score (0-10 with 10 being normal): 10 IMPRESSION: No acute intracranial process. ASPECTS is 10. Code stroke imaging results were communicated on 01/08/2024 at 1:45 pm to provider TEE via telephone, who verbally acknowledged these results. Electronically Signed   By: Zoila Hines M.D.   On: 01/08/2024 13:46    Pending Labs Unresulted Labs (From admission, onward)     Start     Ordered   01/15/24 0500  Creatinine, serum  (enoxaparin  (LOVENOX )    CrCl >/= 30 ml/min)  Weekly,   R     Comments: while on enoxaparin  therapy    01/08/24 1459   01/09/24 0500  Basic metabolic panel  Daily,   R      01/08/24 1447   01/09/24 0500  CBC  Daily,   R      01/08/24 1447   01/09/24 0500  Magnesium  Daily,   R      01/08/24 1447   01/09/24 0500  Phosphorus  Tomorrow morning,   R        01/08/24 1447   01/09/24 0500  Folate  Tomorrow morning,   R        01/08/24 1459   01/08/24 1501  VITAMIN D  25 Hydroxy (Vit-D Deficiency, Fractures)  Once,   R        01/08/24 1500   01/08/24 1458  CBC  (enoxaparin  (LOVENOX )    CrCl >/= 30 ml/min)  Once,   R       Comments: Baseline for enoxaparin  therapy IF NOT ALREADY DRAWN.  Notify MD if PLT < 100 K.    01/08/24 1459   01/08/24 1458  Creatinine, serum  (enoxaparin  (LOVENOX )    CrCl >/= 30 ml/min)  Once,   R       Comments: Baseline for enoxaparin  therapy IF NOT ALREADY DRAWN.    01/08/24 1459             Vitals/Pain Today's Vitals   01/08/24 1830 01/08/24 1830 01/08/24 1841 01/08/24 1953  BP: (!) 150/89  (!) 150/89   Pulse: 100 90 95   Resp: 18 18 18    Temp:   98 F (36.7 C)   TempSrc:   Oral   SpO2: 99%  98%   Weight:      Height:      PainSc:    0-No pain    Isolation Precautions No active isolations  Medications Medications  ipratropium-albuterol  (DUONEB) 0.5-2.5 (3) MG/3ML nebulizer solution 3 mL (has no administration in time range)  labetalol  (NORMODYNE ) injection 10 mg (has no administration in time range)  ondansetron  (ZOFRAN ) injection 4 mg (has no administration in time range)  acetaminophen  (TYLENOL ) tablet 650 mg (has no administration in time range)  senna-docusate (Senokot-S) tablet 1 tablet (has no administration in time range)  guaiFENesin  (ROBITUSSIN) 100 MG/5ML liquid 5 mL (has no administration in time range)  hydrALAZINE  (APRESOLINE ) injection 10 mg (has no administration in time range)  enoxaparin  (LOVENOX ) injection 70 mg (has no administration in time range)  0.9 %  sodium chloride  infusion ( Intravenous New Bag/Given 01/08/24 1539)  oxyCODONE  (Oxy IR/ROXICODONE ) immediate release tablet 5 mg (has no administration in time range)  buPROPion  (WELLBUTRIN  XL) 24 hr  tablet 150 mg (has no administration in time range)  DULoxetine  (CYMBALTA ) DR capsule 60 mg (has no administration in time range)  melatonin tablet 5 mg (has no administration in time range)  prochlorperazine  (COMPAZINE ) injection 10 mg (has no administration in time range)  calcium  gluconate 2 g/ 100 mL sodium chloride  IVPB (0 mg Intravenous Stopped 01/08/24 1639)  LORazepam  (ATIVAN ) injection 1 mg (1 mg Intravenous Given 01/08/24 1654)  iohexol  (OMNIPAQUE ) 350 MG/ML injection 80 mL (80 mLs Intravenous Contrast Given 01/08/24 1810)    Mobility walks with person assist      R Report given to:

## 2024-01-08 NOTE — Progress Notes (Signed)
 At 1303, approximately 30 minutes into Oxaliplatin  infusion, pt woke from nap reporting marked disorientation and dizziness. RN stopped Oxaliplatin  infusion and called Kait PA to chairside to assess pt. VS stable (see chart). Pt transported to ED via stretcher for stroke assessment. Pt denied headache/chest pain/SOB.

## 2024-01-08 NOTE — Progress Notes (Signed)
 [facility]   [phone] Fax:([phone]) [phone]   Clinic Follow up Note   Patient Care Team: Valli Gaw, MD as PCP - General (Family Medicine) Candyce Champagne, MD as Consulting Physician (General Surgery) Tobin Forts, MD as Consulting Physician (Gastroenterology) Sonja Opp, MD as Consulting Physician (Medical Oncology) Omega Bible, MD as Consulting Physician (Neurology) Daleen Dubs, DO as Consulting Physician (Otolaryngology)  Date of Service:  01/08/2024  CHIEF COMPLAINT: f/u of colon cancer  CURRENT THERAPY:  Adjuvant chemotherapy FOLFOX  Oncology History   Cancer of sigmoid colon - pT4a, pN1a - s/p roboric LAR resection 09/27/2023 -pT4aN1M0 stage IIIB, MMS, G2 -Presented with perforated diverticulitis in sigmoid colon in July, 2024. -Patient underwent laparoscopic resection of sigmoid colon, path reviewed moderate differentiated adenocarcinoma with 1 positive lymph nodes. -I recommend adjuvant chemotherapy FOLFOX for 6 months if he can tolerate.He started on 10/23/2023    Assessment and Plan    Colon Cancer 43 year old undergoing sixth cycle of chemotherapy for colon cancer. Reports significant nausea and anorexia post-treatment, but no bone pain. ANC is 0.9, indicating neutropenia. Hemoglobin and platelet counts are normal. Iron levels were low in November, but fatigue has improved with oral iron supplements. Mild right-hand paresthesia likely due to Oxaliplatin . Discussed risks of continued chemotherapy, including severe neuropathy and infection. Cold intolerance may resolve within a month post-treatment; neuropathy may take longer and may not fully recover. Discussed Signatera blood test post-chemotherapy for early recurrence detection. - Proceed with chemotherapy today - Postpone next cycle by one week to allow blood counts to recover - Remove the shot from this cycle - Order iron levels check next visit - Advise wearing a mask and  practicing good hand hygiene to reduce infection risk - Monitor for numbness and tingling, especially in the right hand - Schedule follow-up scan and Signatera blood test after completion of chemotherapy in mid-April - Check blood pressure before chemotherapy session  Hypertension Blood pressure elevated during visit; usually normalizes after a second measurement. - Recheck blood pressure before chemotherapy session  Neutropenia -Secondary to chemotherapy, he tolerated Udenyca  poorly, will stop. -Infection precautions reviewed with him again.  Iron deficient anemia -He has been on oral iron, tolerating well, anemia overall stable and about -Will repeat a ferritin on next visit  Plan -Lab reviewed, ANC 0.9, will proceed to chemotherapy today -Stop the Udenyca  on day 3 due to poor tolerance -Will postpone next cycle chemotherapy for week to allow blood counts recover      SUMMARY OF ONCOLOGIC HISTORY: Oncology History  Cancer of sigmoid colon - pT4a, pN1a - s/p roboric LAR resection 09/27/2023  09/25/2023 Cancer Staging   Staging form: Colon and Rectum, AJCC 8th Edition - Pathologic stage from 09/25/2023: Stage IIIB (pT4a, pN1a, cM0) - Signed by Sharyon Deis, NP on 10/09/2023 Total positive nodes: 1 Histologic grading system: 4 grade system Histologic grade (G): G2 Residual tumor (R): R0 - None   09/25/2023 Surgery   Laparoscopic resection of sigmoid colon  Invasive, moderately differentiated adenocarcinoma of sigmoid colon. Invades into werosal surface. Margins are clear. Negative lymphovascular and perineural invasion. Metastasis to 1/45 lymph nodes.    09/25/2023 Pathology Results   FINAL MICROSCOPIC DIAGNOSIS:  A.   COLON, RESECTION: - Invasive moderately differentiated adenocarcinoma, 7.8 cm involving sigmoid colon - Carcinoma invades into the serosal surface - Resection margins are negative for carcinoma - Negative for lymphovascular or perineural invasion -  Metastatic carcinoma to one of forty-five lymph nodes (1/45) -  See oncology table B.   COLON, DISTAL MARGIN, EXCISION: - Colonic donut within normal limits, negative for carcinoma  ONCOLOGY TABLE:  COLON AND RECTUM, CARCINOMA:  Resection, Including Transanal Disk Excision of Rectal Neoplasms  Procedure: Resection, rectosigmoid colon Tumor Site: Sigmoid colon Tumor Size: 7.8 cm Macroscopic Tumor Perforation: Not identified  Histologic Type: Adenocarcinoma Histologic Grade: G2: Moderately differentiated Multiple Primary Sites: Not applicable Tumor Extension: Carcinoma invades into the serosal surface Lymphovascular Invasion: Not identified Perineural Invasion: Not identified Treatment Effect: No known presurgical therapy Margins:      Margin Status for Invasive Carcinoma: All margins negative for invasive carcinoma      Margin Status for Non-Invasive Tumor: All margins negative for high-grade dysplasia / intramucosal           carcinoma and low-grade dysplasia Regional Lymph Nodes:      Number of Lymph Nodes with Tumor: 1      Number of Lymph Nodes Examined: 45 Tumor Deposits: Not identified Distant Metastasis:      Distant Site(s) Involved: Not applicable Pathologic Stage Classification (pTNM, AJCC 8th Edition): pT4a, pN1a Ancillary Studies: MMR / MSI testing will be ordered. Representative Tumor Block: A2 Comments: None (v4.2.0.1)     09/30/2023 Initial Diagnosis   Cancer of sigmoid colon - pT4a, pN1a - s/p roboric LAR resection 09/27/2023   10/08/2023 Imaging   CT Chest with contrast  IMPRESSION: Negative. No evidence of thoracic metastatic disease or other significant abnormality.   10/23/2023 -  Chemotherapy   Patient is on Treatment Plan : COLORECTAL FOLFOX q14d x 6 months      Genetic Testing   [facility] CancerNext-Expanded Panel+RNA was Negative. Report date is 11/15/2023.   The CancerNext-Expanded gene panel offered by [facility] and includes sequencing,  rearrangement, and RNA analysis for the following 71 genes: AIP, ALK, APC, ATM, AXIN2, BAP1, BARD1, BMPR1A, BRCA1, BRCA2, BRIP1, CDC73, CDH1, CDK4, CDKN1B, CDKN2A, CHEK2, CTNNA1, DICER1, FH, FLCN, KIF1B, LZTR1, MAX, MEN1, MET, MLH1, MSH2, MSH3, MSH6, MUTYH, NF1, NF2, NTHL1, PALB2, PHOX2B, PMS2, POT1, PRKAR1A, PTCH1, PTEN, RAD51C, RAD51D, RB1, RET, SDHA, SDHAF2, SDHB, SDHC, SDHD, SMAD4, SMARCA4, SMARCB1, SMARCE1, STK11, SUFU, TMEM127, TP53, TSC1, TSC2, and VHL (sequencing and deletion/duplication); EGFR, EGLN1, HOXB13, KIT, MITF, PDGFRA, POLD1, and POLE (sequencing only); EPCAM and GREM1 (deletion/duplication only).        Discussed the use of AI scribe software for clinical note transcription with the patient, who gave verbal consent to proceed.  History of Present Illness   A [age]-year-old patient with a history of colon cancer presents for a follow-up visit. He reports experiencing severe sickness for about four days after receiving a shot during his last visit. The sickness was characterized by extreme tiredness, lack of appetite, and severe nausea, which was managed with medication. Interestingly, the patient did not experience any bone pain, which he was expecting to feel.  The patient is currently halfway through his treatment cycle and is scheduled for another cycle. However, due to the severe sickness experienced after the last shot, the patient is considering not receiving the shot in the next cycle.  In addition to the sickness, the patient also reports having cold intolerance and feeling sleepy. However, he does not report any numbness. The patient is also taking an iron pill due to a previously low iron count.  The patient expresses curiosity about how the treatment is working and what to expect after the treatment is completed. He also expresses a desire to understand the process of cancer detection  and treatment.         All other systems were reviewed with the patient and are  negative.  MEDICAL HISTORY:  Past Medical History:  Diagnosis Date   ADHD (attention deficit hyperactivity disorder)    Annual physical exam 09/09/2019   Anxiety and depression    Cancer (HCC)    Colon cancer (HCC)    COVID-19    12/28/19   Depression, recurrent (HCC) 09/09/2019   Headache 12/31/2021   History of kidney stones    Hypertriglyceridemia 07/17/2021   Insomnia    Inverted nipple 09/16/2019   Kidney stone    Mastoiditis of both sides    Nystagmus 01/22/2022   Obesity (BMI 30-39.9)    Obesity (BMI 30-39.9)    Suicidal ideation 07/14/2021   Vertigo     SURGICAL HISTORY: Past Surgical History:  Procedure Laterality Date   COLON RESECTION  09/25/2023   PORTACATH PLACEMENT Right 10/17/2023   Procedure: INSERTION PORT-A-CATH WITH Kim Pen GUIDANCE;  Surgeon: Candyce Champagne, MD;  Location: [facility];  Service: General;  Laterality: Right;   PROCTOSCOPY N/A 09/25/2023   Procedure: RIGID PROCTOSCOPY;  Surgeon: Candyce Champagne, MD;  Location: [facility];  Service: General;  Laterality: N/A;    I have reviewed the social history and family history with the patient and they are unchanged from previous note.  ALLERGIES:  has no known allergies.  MEDICATIONS:  Current Outpatient Medications  Medication Sig Dispense Refill   Acetaminophen  (TYLENOL  PO) Take 500 mg by mouth every 6 (six) hours.     buPROPion  (WELLBUTRIN  XL) 150 MG 24 hr tablet Take 1 tablet (150 mg total) by mouth every morning. 30 tablet 2   DULoxetine  (CYMBALTA ) 60 MG capsule Take 1 capsule (60 mg total) by mouth 2 (two) times daily. 60 capsule 2   eszopiclone  (LUNESTA ) 2 MG TABS tablet Take 1 tablet (2 mg total) by mouth at bedtime as needed for sleep. Take immediately before bedtime 30 tablet 1   lidocaine -prilocaine  (EMLA ) cream Apply to affected area once 30 g 3   ondansetron  (ZOFRAN -ODT) 8 MG disintegrating tablet Take 1 tablet (8 mg total) by mouth every 8 (eight) hours as needed for nausea  or vomiting. 30 tablet 1   oxyCODONE  (OXY IR/ROXICODONE ) 5 MG immediate release tablet Take 1 tablet (5 mg total) by mouth every 6 (six) hours as needed for moderate pain, severe pain or breakthrough pain. 20 tablet 0   prochlorperazine  (COMPAZINE ) 10 MG tablet Take 1 tablet (10 mg total) by mouth every 6 (six) hours as needed for nausea or vomiting. (Patient taking differently: Take 10 mg by mouth every 6 (six) hours as needed for nausea or vomiting. Have not started) 30 tablet 1   propranolol  (INDERAL ) 10 MG tablet Take 1 tablet (10 mg total) by mouth 2 (two) times daily as needed. 60 tablet 2   No current facility-administered medications for this visit.    PHYSICAL EXAMINATION: ECOG PERFORMANCE STATUS: 1 - Symptomatic but completely ambulatory  Vitals:   01/08/24 1026  BP: (!) 148/100  Pulse: 96  Resp: 16  Temp: (!) 97 F (36.1 C)  SpO2: 100%   Wt Readings from Last 3 Encounters:  01/08/24 294 lb 9.6 oz (133.6 kg)  12/24/23 288 lb 9.6 oz (130.9 kg)  12/04/23 279 lb 14.4 oz (127 kg)     GENERAL:alert, no distress and comfortable SKIN: skin color, texture, turgor are normal, no rashes or significant lesions EYES: normal, Conjunctiva are pink and  non-injected, sclera clear NECK: supple, thyroid  normal size, non-tender, without nodularity LYMPH:  no palpable lymphadenopathy in the cervical, axillary  LUNGS: clear to auscultation and percussion with normal breathing effort HEART: regular rate & rhythm and no murmurs and no lower extremity edema ABDOMEN:abdomen soft, non-tender and normal bowel sounds Musculoskeletal:no cyanosis of digits and no clubbing  NEURO: alert & oriented x 3 with fluent speech.  Vibration sensation slightly diminished in right hand, but normal in left hand.      LABORATORY DATA:  I have reviewed the data as listed    Latest Ref Rng & Units 01/08/2024   10:07 AM 12/24/2023    8:30 AM 12/04/2023   10:25 AM  CBC  WBC 4.0 - 10.5 K/uL 2.9  3.1  3.4    Hemoglobin 13.0 - 17.0 g/dL 95.2  84.1  32.4   Hematocrit 39.0 - 52.0 % 34.2  38.0  34.1   Platelets 150 - 400 K/uL 249  296  215         Latest Ref Rng & Units 12/24/2023    8:30 AM 12/04/2023   10:25 AM 11/20/2023   10:10 AM  CMP  Glucose 70 - 99 mg/dL 401  027  97   BUN 6 - 20 mg/dL 11  11  14    Creatinine 0.61 - 1.24 mg/dL 2.53  6.64  4.03   Sodium 135 - 145 mmol/L 136  137  138   Potassium 3.5 - 5.1 mmol/L 3.7  3.5  3.9   Chloride 98 - 111 mmol/L 104  106  104   CO2 22 - 32 mmol/L 24  25  29    Calcium  8.9 - 10.3 mg/dL 9.4  8.9  9.4   Total Protein 6.5 - 8.1 g/dL 7.7  7.2  7.7   Total Bilirubin 0.0 - 1.2 mg/dL 0.4  0.4  0.6   Alkaline Phos 38 - 126 U/L 67  68  64   AST 15 - 41 U/L 20  12  12    ALT 0 - 44 U/L 27  12  12        RADIOGRAPHIC STUDIES: I have personally reviewed the radiological images as listed and agreed with the findings in the report. No results found.    Orders Placed This Encounter  Procedures   Ferritin    Standing Status:   Future    Expected Date:   01/29/2024    Expiration Date:   01/07/2025   CBC with Differential (Cancer Center Only)    Standing Status:   Future    Expected Date:   02/27/2024    Expiration Date:   02/26/2025   CMP (Cancer Center only)    Standing Status:   Future    Expected Date:   02/27/2024    Expiration Date:   02/26/2025   All questions were answered. The patient knows to call the clinic with any problems, questions or concerns. No barriers to learning was detected. The total time spent in the appointment was 25 minutes.     Sonja , MD 01/08/2024

## 2024-01-08 NOTE — ED Notes (Signed)
 Patient transported to MRI

## 2024-01-08 NOTE — ED Notes (Signed)
 Pt's urinal emptied w/1000cc's clear yellow-straw urine noted.

## 2024-01-08 NOTE — Progress Notes (Signed)
 [facility] Telemedicine Consult   Requesting Provider: Debbra Fairy, PA-c Consult Participants: Patient, bedside RN, atrium RN Location of the provider: [facility] Location of the patient: [facility]  This consult was provided via telemedicine with 2-way video and audio communication. The patient/family was informed that care would be provided in this way and agreed to receive care in this manner.    Chief Complaint: Dizziness and right-sided weakness and numbness  HPI: [name] is a 43 year old Caucasian male with past medical history of obesity, ADHD, depression, anxiety currently receiving chemotherapy for sigmoid cancer.  He came to the infusion center at 10 AM and was fine.  Subsequently fell asleep.  At 1230 he woke up and complained of severe dizziness and right-sided weakness and numbness.  Code stroke was activated.  When seen on the CT scanner patient complained of subjective vertigo which was reduced slightly when he closed his eyes.  He also had right-sided numbness and some mild subjective weakness.  On initial exam he had an NIH of 3 for subtle right upper and lower extremity drift and sensory loss with subsequently during the course of my consultation he noted some subjective improvement and was able to sit up on the CT scan table and did not feel as dizzy right-sided drift resolved.  He still had some persistent subjective right hemibody sensory loss.    LKW: ? 1230 tnk given?: No, deficits mild and resolving IR Thrombectomy? No, clinically not suggestive of LVO Modified Rankin Scale: 1-No significant post stroke disability and can perform usual duties with stroke symptoms Time of teleneurologist evaluation: 142 pm  Exam: Vitals:   01/08/24 1335 01/08/24 1445  BP: (!) 167/109   Pulse: 95   Resp: 14   Temp:  97.9 F (36.6 C)  SpO2: 100%     General: Obese middle-age Caucasian male.  Not in distress. Complains of subjective  vertigo and keeps his eyes closed. 1A: Level of Consciousness - 0 1B: Ask Month and Age - 1 1C: 'Blink Eyes' & 'Squeeze Hands' - 0 2: Test Horizontal Extraocular Movements - 0.  No nystagmus 3: Test Visual Fields - 0 4: Test Facial Palsy - 0 5A: Test Left Arm Motor Drift - 0 5B: Test Right Arm Motor Drift - 0 6A: Test Left Leg Motor Drift - 0 6B: Test Right Leg Motor Drift - 0 7: Test Limb Ataxia - 0 8: Test Sensation - 1 9: Test Language/Aphasia- 0 10: Test Dysarthria - 0 11: Test Extinction/Inattention - 0 NIHSS score: 1   Imaging Reviewed:  CT head noncontrast study shows no acute abnormality.  Aspect score of 10.  Labs reviewed in epic and pertinent values follow: I-STAT CHEM 8 significant for blood glucose 115, ionized calcium  1.12, hemoglobin 11.6, hematocrit 34.0 and pCO2 20 CBC shows total white count 3.5 PT PTT and normal.  INR is normal.  Ethanol level negative Assessment: 43 year old Caucasian male with sudden onset of subjective dizziness vertigo and mild right-sided weakness and hemisensory loss following chemotherapy with oxaliplatin  today. Patient deficits appear mild and seem to be improving so even though he  is in the TNK window will not pursue thrombolysis after extensive discussion with the patient about risk benefits and alternatives including 4 to 6% risk of intracerebral hemorrhage.  Patient wants to hold off as he feels he is subjectively improving.  Patient is not a thrombectomy candidate as clinical presentation is not compatible with LVO.[facility]  Differential diagnosis also includes possible  toxicity from his oxaliplatin  infusion though this is a 60 infusion and that would make it less likely. Recommendations: Admit to medical hospitalist team for observation.  Continue frequent neurochecks while patient remains in the TNK window.  Check CT angiogram of the brain and neck.  MRI scan of the brain.,  Lipid profile, hemoglobin A1c and 2D echo.  Dual antiplatelet therapy  with aspirin and Plavix for 3 weeks followed by aspirin alone and aggressive risk factor modification.  Bedside swallow eval by RN.  Therapy consults. Stroke team will follow on video consults. Long discussion with ER MD and answered questions. Discussed with Dr. Rand Burrs MD  This patient is receiving care for possible acute neurological changes. There was 60 minutes of care by this provider at the time of service, including time for direct evaluation via telemedicine, review of medical records, imaging studies and discussion of findings with providers, the patient and/or family.  Cory Beaver, MD [facility] [phone]  If 7pm- 7am, please page neurology on call as listed in AMION.

## 2024-01-08 NOTE — ED Notes (Signed)
[place]ie PA at bedside 

## 2024-01-08 NOTE — ED Triage Notes (Signed)
 New onset dizziness. Right sided weakness. LNW was 1 hr ago. Hx of sigmoid colon. Was at cancer center being treated with oxaliplatin . Decadron  and aloxi  given today. BP 158/103 HR 85 O2 100% RA

## 2024-01-08 NOTE — ED Provider Notes (Signed)
 [place] EMERGENCY DEPARTMENT AT [facility] Provider Note   CSN: [id number] Arrival date & time: 01/08/24  1326     History  Chief Complaint  Patient presents with  . Weakness    Cory Gomez is a 43 y.o. male.  The history is provided by the patient and medical records. No language interpreter was used.  Weakness    43 year old male with significant history of sigmoid cancer currently receiving chemotherapy, obesity, ADHD, depression, anxiety brought here from the cancer center with concerns of stroke.  Approximately an hour ago was when patient was last known normal.  He developed acute onset of dizziness in which she described room spinning sensation to the left, mildly improved with eyes closed but still present along with right-sided weakness.  He was at the cancer center being treated with oxaliplatin .  He did receive Aloxi  and Decadron  earlier today.  He has never had any stroke.  He did have 1 similar episode last year that thought was to be stroke related but was found to have sinusitis.  Home Medications Prior to Admission medications   Medication Sig Start Date End Date Taking? Authorizing Provider  Acetaminophen  (TYLENOL  PO) Take 500 mg by mouth every 6 (six) hours.    [provider]  buPROPion  (WELLBUTRIN  XL) 150 MG 24 hr tablet Take 1 tablet (150 mg total) by mouth every morning. 11/29/23   Yves Herb, MD  DULoxetine  (CYMBALTA ) 60 MG capsule Take 1 capsule (60 mg total) by mouth 2 (two) times daily. 01/07/24 04/06/24  Yves Herb, MD  eszopiclone  (LUNESTA ) 2 MG TABS tablet Take 1 tablet (2 mg total) by mouth at bedtime as needed for sleep. Take immediately before bedtime 01/07/24 01/06/25  Yves Herb, MD  lidocaine -prilocaine  (EMLA ) cream Apply to affected area once 10/09/23   Sonja [place], MD  ondansetron  (ZOFRAN -ODT) 8 MG disintegrating tablet Take 1 tablet (8 mg total) by mouth every 8 (eight) hours as needed for nausea or  vomiting. 12/04/23   Sharyon Deis, NP  oxyCODONE  (OXY IR/ROXICODONE ) 5 MG immediate release tablet Take 1 tablet (5 mg total) by mouth every 6 (six) hours as needed for moderate pain, severe pain or breakthrough pain. 09/25/23   Candyce Champagne, MD  prochlorperazine  (COMPAZINE ) 10 MG tablet Take 1 tablet (10 mg total) by mouth every 6 (six) hours as needed for nausea or vomiting. Patient taking differently: Take 10 mg by mouth every 6 (six) hours as needed for nausea or vomiting. Have not started 10/09/23   Sonja Barton, MD  propranolol  (INDERAL ) 10 MG tablet Take 1 tablet (10 mg total) by mouth 2 (two) times daily as needed. 11/01/23   Yves Herb, MD      Allergies    Patient has no known allergies.    Review of Systems   Review of Systems  Neurological:  Positive for weakness.  All other systems reviewed and are negative.   Physical Exam Updated Vital Signs BP (!) 167/109   Pulse 95   Resp 14   Ht 6\' 2"  (1.88 m)   Wt 133.6 kg   SpO2 100%   BMI 37.82 kg/m  Physical Exam Vitals and nursing note reviewed.  Constitutional:      General: He is not in acute distress.    Appearance: He is well-developed.     Comments: Obese male, laying in bed, eyes closed, appear to be in no acute discomfort.  HENT:     Head:  Atraumatic.  Eyes:     Conjunctiva/sclera: Conjunctivae normal.  Cardiovascular:     Rate and Rhythm: Normal rate and regular rhythm.     Pulses: Normal pulses.     Heart sounds: Normal heart sounds.  Pulmonary:     Effort: Pulmonary effort is normal.     Breath sounds: Normal breath sounds.  Abdominal:     Palpations: Abdomen is soft.     Tenderness: There is no abdominal tenderness.  Musculoskeletal:     Cervical back: Normal range of motion and neck supple. No rigidity or tenderness.  Skin:    Findings: No rash.  Neurological:     Mental Status: He is alert.     Comments: Neurologic exam:  Speech clear, pupils equal round reactive to light, having trouble  tracking my fingers however extraocular movements intact  Normal peripheral visual fields Cranial nerves III through XII normal including no facial droop Follows commands, moves all extremities x 4 with decreased grip strength bilaterally, 4/5 strength to RUE and RLE, 5/5 strength to LUE and LLE Sensation diminished to right upper extremity compared to left Coordination is poor Right pronator drift Gait not tested     ED Results / Procedures / Treatments   Labs (all labs ordered are listed, but only abnormal results are displayed) Labs Reviewed  CBC - Abnormal; Notable for the following components:      Result Value   WBC 3.5 (*)    Hemoglobin 11.6 (*)    HCT 36.8 (*)    RDW 21.4 (*)    All other components within normal limits  COMPREHENSIVE METABOLIC PANEL - Abnormal; Notable for the following components:   Glucose, Bld 114 (*)    All other components within normal limits  CBG MONITORING, ED - Abnormal; Notable for the following components:   Glucose-Capillary 113 (*)    All other components within normal limits  I-STAT CHEM 8, ED - Abnormal; Notable for the following components:   Glucose, Bld 115 (*)    Calcium , Ion 1.12 (*)    TCO2 20 (*)    Hemoglobin 11.6 (*)    HCT 34.0 (*)    All other components within normal limits  ETHANOL  PROTIME-INR  APTT  DIFFERENTIAL  RAPID URINE DRUG SCREEN, HOSP PERFORMED  URINALYSIS, ROUTINE W REFLEX MICROSCOPIC  TYPE AND SCREEN    EKG None  Date: 01/08/2024  Rate: 88  Rhythm: normal sinus rhythm  QRS Axis: normal  Intervals: normal  ST/T Wave abnormalities: normal  Conduction Disutrbances: none  Narrative Interpretation:   Old EKG Reviewed: No significant changes noted    Radiology CT HEAD CODE STROKE WO CONTRAST Result Date: 01/08/2024 CLINICAL DATA:  Code stroke.  Right-sided weakness EXAM: CT HEAD WITHOUT CONTRAST TECHNIQUE: Contiguous axial images were obtained from the base of the skull through the vertex without  intravenous contrast. RADIATION DOSE REDUCTION: This exam was performed according to the departmental dose-optimization program which includes automated exposure control, adjustment of the mA and/or kV according to patient size and/or use of iterative reconstruction technique. COMPARISON:  12/29/2021 CT head FINDINGS: Brain: No evidence of acute infarction, hemorrhage, mass, mass effect, or midline shift. No hydrocephalus or extra-axial collection. Partial empty sella. Normal craniocervical junction. Vascular: No hyperdense vessel. Skull: Negative for fracture or focal lesion. Sinuses/Orbits: Mucous retention cysts in the maxillary sinuses. No acute finding in the orbits. Other: The mastoid air cells are well aerated. ASPECTS [facility] Stroke Program Early CT Score) - Ganglionic level infarction (  caudate, lentiform nuclei, internal capsule, insula, M1-M3 cortex): 7 - Supraganglionic infarction (M4-M6 cortex): 3 Total score (0-10 with 10 being normal): 10 IMPRESSION: No acute intracranial process. ASPECTS is 10. Code stroke imaging results were communicated on 01/08/2024 at 1:45 pm to provider TEE via telephone, who verbally acknowledged these results. Electronically Signed   By: Zoila Hines M.D.   On: 01/08/2024 13:46    Procedures .Critical Care  Performed by: Debbra Fairy, PA-C Authorized by: Debbra Fairy, PA-C   Critical care provider statement:    Critical care time (minutes):  35   Critical care was time spent personally by me on the following activities:  Development of treatment plan with patient or surrogate, discussions with consultants, evaluation of patient's response to treatment, examination of patient, ordering and review of laboratory studies, ordering and review of radiographic studies, ordering and performing treatments and interventions, pulse oximetry, re-evaluation of patient's condition and review of old charts     Medications Ordered in ED Medications  ipratropium-albuterol   (DUONEB) 0.5-2.5 (3) MG/3ML nebulizer solution 3 mL (has no administration in time range)  labetalol  (NORMODYNE ) injection 10 mg (has no administration in time range)  ondansetron  (ZOFRAN ) injection 4 mg (has no administration in time range)  acetaminophen  (TYLENOL ) tablet 650 mg (has no administration in time range)  senna-docusate (Senokot-S) tablet 1 tablet (has no administration in time range)  guaiFENesin  (ROBITUSSIN) 100 MG/5ML liquid 5 mL (has no administration in time range)  hydrALAZINE  (APRESOLINE ) injection 10 mg (has no administration in time range)  calcium  gluconate 2 g/ 100 mL sodium chloride  IVPB (has no administration in time range)    ED Course/ Medical Decision Making/ A&P                                 Medical Decision Making Amount and/or Complexity of Data Reviewed Labs: ordered. Radiology: ordered.   BP (!) 167/109   Pulse 95   Resp 14   Ht 6\' 2"  (1.88 m)   Wt 133.6 kg   SpO2 100%   BMI 37.82 kg/m   73:36 PM  43 year old male with significant history of sigmoid cancer currently receiving chemotherapy, obesity, ADHD, depression, anxiety brought here from the cancer center with concerns of stroke.  Approximately an hour ago was when patient was last known normal.  He developed acute onset of dizziness in which she described room spinning sensation to the left, mildly improved with eyes closed but still present along with right-sided weakness.  He was at the cancer center being treated with oxaliplatin .  He did receive Aloxi  and Decadron  earlier today.  He has never had any stroke.  He did have 1 similar episode last year that thought was to be stroke related but was found to have sinusitis.  Exam notable for right upper and right lower extremity weakness with decree sensation as well as decreased grip strength bilaterally.  No significant nystagmus appreciated but patient endorsed feeling dizzy.  Given his presentation, code stroke activated.  Care discussed  with Dr. Charlee Conine  2:05 PM Teleneurology has evaluated patient and recommend initiation of TNKase .  They request for CTA scan of the head and neck.  Will have patient transferred over to [facility] to the neuro floor.   2:19 PM Neurologist reach out and states no TNK at this time.  He felt that symptom is improving and is likely a side effect of patient's chemotherapy.  Will discontinue  the order.  Teleneurology also recommend canceling the CT scan of the head and neck and just do a stroke workup.  Will order brain MRI and will consult for admission.  -Labs ordered, independently viewed and interpreted by me.  Labs remarkable for reassuring value -The patient was maintained on a cardiac monitor.  I personally viewed and interpreted the cardiac monitored which showed an underlying rhythm of: NSR -Imaging independently viewed and interpreted by me and I agree with radiologist's interpretation.  Result remarkable for code stroke head CT without acute intracranial process. -This patient presents to the ED for concern of dizzy, this involves an extensive number of treatment options, and is a complaint that carries with it a high risk of complications and morbidity.  The differential diagnosis includes medication side effect, stroke, reaction, peripheral vertigo, central vertigo -Co morbidities that complicate the patient evaluation includes depression, stroke, prediabetes -Treatment includes monitoring -Reevaluation of the patient after these medicines showed that the patient improved -PCP office notes or outside notes reviewed -Discussion with specialist teleneurology who recommend cancel code stroke and to have pt admitted for stroke work up.  Suspect sxs likely medication side effect.  I have consulted [facility] Dr. Ariel Begun who agrees to admit pt for stroke work up.  -Escalation to admission/observation considered: patient is agreeable with admission.   On reassessment, patient is drastically  improved, speech is fluent, strength is equal throughout and he report feeling much better.  Patient is amenable for hospital admission for further stroke workup.         Final Clinical Impression(s) / ED Diagnoses Final diagnoses:  Weakness of right upper extremity  Dizzy    Rx / DC Orders ED Discharge Orders     None         Debbra Fairy, PA-C 01/08/24 1452    Carin Charleston, MD 01/08/24 1540

## 2024-01-08 NOTE — ED Notes (Addendum)
 1334 cart activation for pt in  CT at time of activation. 1331 Neuro paged 1332 Dr Janett Medin joins cart 1347 Janett Medin begins [facility]  1350Provided Janett Medin with neg CT findings as read in chart 1416 Spoke with RN at infusion center who advises lkwt 1203 when medication was hung.  Advisess appx later pt noted to be sleeping and upon awakening after 1300 had r side weakness.

## 2024-01-08 NOTE — Patient Instructions (Signed)
 [facility] - A DEPT OF [facility]  Discharge Instructions: Thank you for choosing [facility] to provide your oncology and hematology care.   If you have a lab appointment with the Cancer Center, please go directly to the Cancer Center and check in at the registration area.   Wear comfortable clothing and clothing appropriate for easy access to any Portacath or PICC line.   We strive to give you quality time with your provider. You may need to reschedule your appointment if you arrive late (15 or more minutes).  Arriving late affects you and other patients whose appointments are after yours.  Also, if you miss three or more appointments without notifying the office, you may be dismissed from the clinic at the provider's discretion.      For prescription refill requests, have your pharmacy contact our office and allow 72 hours for refills to be completed.    Today you received the following chemotherapy and/or immunotherapy agents: Oxaliplatin, Leucovorin, Fluorouracil.       To help prevent nausea and vomiting after your treatment, we encourage you to take your nausea medication as directed.  BELOW ARE SYMPTOMS THAT SHOULD BE REPORTED IMMEDIATELY: *FEVER GREATER THAN 100.4 F (38 C) OR HIGHER *CHILLS OR SWEATING *NAUSEA AND VOMITING THAT IS NOT CONTROLLED WITH YOUR NAUSEA MEDICATION *UNUSUAL SHORTNESS OF BREATH *UNUSUAL BRUISING OR BLEEDING *URINARY PROBLEMS (pain or burning when urinating, or frequent urination) *BOWEL PROBLEMS (unusual diarrhea, constipation, pain near the anus) TENDERNESS IN MOUTH AND THROAT WITH OR WITHOUT PRESENCE OF ULCERS (sore throat, sores in mouth, or a toothache) UNUSUAL RASH, SWELLING OR PAIN  UNUSUAL VAGINAL DISCHARGE OR ITCHING   Items with * indicate a potential emergency and should be followed up as soon as possible or go to the Emergency Department if any problems should occur.  Please show the  CHEMOTHERAPY ALERT CARD or IMMUNOTHERAPY ALERT CARD at check-in to the Emergency Department and triage nurse.  Should you have questions after your visit or need to cancel or reschedule your appointment, please contact [facility] - A DEPT OF [facility]  Dept: [phone]  and follow the prompts.  Office hours are 8:00 a.m. to 4:30 p.m. Monday - Friday. Please note that voicemails left after 4:00 p.m. may not be returned until the following business day.  We are closed weekends and major holidays. You have access to a nurse at all times for urgent questions. Please call the main number to the clinic Dept: [phone] and follow the prompts.   For any non-urgent questions, you may also contact your provider using MyChart. We now offer e-Visits for anyone 36 and older to request care online for non-urgent symptoms. For details visit [email or web address].   Also download the MyChart app! Go to the app store, search "MyChart", open the app, select [place], and log in with your MyChart username and password.

## 2024-01-08 NOTE — Progress Notes (Signed)
 Therapy Group Progress Note    Patient was scheduled to attend group therapy at 5:00pm.  CSW spoke with him by phone earlier in the day to confirm that he has [employer] and would attend.  He did not show up.  His chart reflects that he is in the Emergency Room.  There will be no charge for this missed appointment.  Leotha Rang, LCSW 01/08/2024, 6:13 PM

## 2024-01-08 NOTE — H&P (Signed)
 History and Physical    Cory Gomez ZOX:[id number] DOB: Jan 11, 1981 DOA: 01/08/2024  PCP: Valli Gaw, MD Patient coming from: Cancer center  Chief Complaint: Dizziness and right-sided weakness  HPI: Cory Gomez is a 43 y.o. male with medical history significant of colon cancer status post resection in October 2024 currently on chemotherapy which was started in October 2024, anxiety/depression sent to the hospital for evaluation of dizziness and right-sided weakness. While patient was receiving his chemotherapy today, about 1 hour into his treatment he became very dizzy and started experiencing right-sided weakness.  Initially code stroke was called, CT of the head was negative.  Routine blood work showed hypocalcemia otherwise unremarkable.  Neurology recommended admitting the patient for stroke rule out.  Okay to [facility] this [facility] to [name], discussed with Dr. Janett Medin.   When I saw the patient at bedside his symptoms had pretty much resolved besides mild dizziness.  Denied any weakness.  Not a candidate for any thrombectomy at this time.   Review of Systems: As per HPI otherwise 10 point review of systems negative.  Review of Systems Otherwise negative except as per HPI, including: General: Denies fever, chills, night sweats or unintended weight loss. Resp: Denies cough, wheezing, shortness of breath. Cardiac: Denies chest pain, palpitations, orthopnea, paroxysmal nocturnal dyspnea. GI: Denies abdominal pain, nausea, vomiting, diarrhea or constipation GU: Denies dysuria, frequency, hesitancy or incontinence MS: Denies muscle aches, joint pain or swelling Neuro: Reported of right-sided weakness and dizziness Psych: Denies anxiety, depression, SI/HI/AVH Skin: Denies new rashes or lesions ID: Denies sick contacts, exotic exposures, travel  Past Medical History:  Diagnosis Date   ADHD (attention deficit hyperactivity disorder)    Annual physical exam 09/09/2019    Anxiety and depression    Cancer (HCC)    Colon cancer (HCC)    COVID-19    12/28/19   Depression, recurrent (HCC) 09/09/2019   Headache 12/31/2021   History of kidney stones    Hypertriglyceridemia 07/17/2021   Insomnia    Inverted nipple 09/16/2019   Kidney stone    Mastoiditis of both sides    Nystagmus 01/22/2022   Obesity (BMI 30-39.9)    Obesity (BMI 30-39.9)    Suicidal ideation 07/14/2021   Vertigo     Past Surgical History:  Procedure Laterality Date   COLON RESECTION  09/25/2023   PORTACATH PLACEMENT Right 10/17/2023   Procedure: INSERTION PORT-A-CATH WITH Kim Pen GUIDANCE;  Surgeon: Candyce Champagne, MD;  Location[facility];  Service: General;  Laterality: Right;   PROCTOSCOPY N/A 09/25/2023   Procedure: RIGID PROCTOSCOPY;  Surgeon: Candyce Champagne, MD;  Location: [facility];  Service: General;  Laterality: N/A;    SOCIAL HISTORY:  reports that he has never smoked. He has never used smokeless tobacco. He reports that he does not drink alcohol and does not use drugs.  No Known Allergies  FAMILY HISTORY: Family History  Problem Relation Age of Onset   Diabetes Mother    Hypertension Mother    Hyperlipidemia Mother    Diabetes Mellitus II Mother    Anxiety disorder Mother    Depression Mother    Basal cell carcinoma Mother 15 - 92   Stroke Father    Alcoholism Father    Anxiety disorder Brother    Depression Brother    Hypertension Brother    Hyperlipidemia Brother    Diabetes type II Brother    ADD / ADHD Brother    Squamous cell carcinoma Maternal Grandmother  Dementia Maternal Grandfather    Basal cell carcinoma Maternal Grandfather    Prostate cancer Maternal Grandfather 51   Lung cancer Paternal Grandfather 62       smoked   Stomach cancer Neg Hx    Colon cancer Neg Hx    Esophageal cancer Neg Hx    Pancreatic cancer Neg Hx      Prior to Admission medications   Medication Sig Start Date End Date Taking? Authorizing Provider   Acetaminophen  (TYLENOL  PO) Take 500 mg by mouth every 6 (six) hours.    [provider]  buPROPion  (WELLBUTRIN  XL) 150 MG 24 hr tablet Take 1 tablet (150 mg total) by mouth every morning. 11/29/23   Yves Herb, MD  DULoxetine  (CYMBALTA ) 60 MG capsule Take 1 capsule (60 mg total) by mouth 2 (two) times daily. 01/07/24 04/06/24  Yves Herb, MD  eszopiclone  (LUNESTA ) 2 MG TABS tablet Take 1 tablet (2 mg total) by mouth at bedtime as needed for sleep. Take immediately before bedtime 01/07/24 01/06/25  Yves Herb, MD  lidocaine -prilocaine  (EMLA ) cream Apply to affected area once 10/09/23   Sonja Soso, MD  ondansetron  (ZOFRAN -ODT) 8 MG disintegrating tablet Take 1 tablet (8 mg total) by mouth every 8 (eight) hours as needed for nausea or vomiting. 12/04/23   Sharyon Deis, NP  oxyCODONE  (OXY IR/ROXICODONE ) 5 MG immediate release tablet Take 1 tablet (5 mg total) by mouth every 6 (six) hours as needed for moderate pain, severe pain or breakthrough pain. 09/25/23   Candyce Champagne, MD  prochlorperazine  (COMPAZINE ) 10 MG tablet Take 1 tablet (10 mg total) by mouth every 6 (six) hours as needed for nausea or vomiting. Patient taking differently: Take 10 mg by mouth every 6 (six) hours as needed for nausea or vomiting. Have not started 10/09/23   Sonja Rio Bravo, MD  propranolol  (INDERAL ) 10 MG tablet Take 1 tablet (10 mg total) by mouth 2 (two) times daily as needed. 11/01/23   Yves Herb, MD    Physical Exam: Vitals:   01/08/24 1330 01/08/24 1335 01/08/24 1445  BP:  (!) 167/109   Pulse:  95   Resp:  14   Temp:   97.9 F (36.6 C)  TempSrc:   Oral  SpO2:  100%   Weight: 133.6 kg    Height: 6\' 2"  (1.88 m)        Constitutional: NAD, calm, comfortable Eyes: PERRL, lids and conjunctivae normal ENMT: Mucous membranes are moist. Posterior pharynx clear of any exudate or lesions.Normal dentition.  Neck: normal, supple, no masses, no thyromegaly Respiratory: clear to auscultation  bilaterally, no wheezing, no crackles. Normal respiratory effort. No accessory muscle use.  Cardiovascular: Regular rate and rhythm, no murmurs / rubs / gallops. No extremity edema. 2+ pedal pulses. No carotid bruits.  Abdomen: no tenderness, no masses palpated. No hepatosplenomegaly. Bowel sounds positive.  Musculoskeletal: no clubbing / cyanosis. No joint deformity upper and lower extremities. Good ROM, no contractures. Normal muscle tone.  Skin: no rashes, lesions, ulcers. No induration Neurologic: CN 2-12 grossly intact. Sensation intact, DTR normal. Strength 5/5 in all 4.  Psychiatric: Normal judgment and insight. Alert and oriented x 3. Normal mood.    Body mass index is 37.82 kg/m.      Labs on Admission: I have personally reviewed following labs and imaging studies  CBC: Recent Labs  Lab 01/08/24 1007 01/08/24 1333 01/08/24 1340  WBC 2.9* 3.5*  --   NEUTROABS 0.9*  --   --  HGB 11.2* 11.6* 11.6*  HCT 34.2* 36.8* 34.0*  MCV 80.7 84.8  --   PLT 249 287  --    Basic Metabolic Panel: Recent Labs  Lab 01/08/24 1007 01/08/24 1333 01/08/24 1340  NA 139 135 137  K 3.6 3.9 4.0  CL 107 103 105  CO2 27 23  --   GLUCOSE 110* 114* 115*  BUN 9 10 8   CREATININE 0.72 0.75 0.70  CALCIUM  9.0 9.2  --    GFR: Estimated Creatinine Clearance: 178.5 mL/min (by C-G formula based on SCr of 0.7 mg/dL). Liver Function Tests: Recent Labs  Lab 01/08/24 1007 01/08/24 1333  AST 26 36  ALT 29 34  ALKPHOS 67 69  BILITOT 0.3 0.4  PROT 7.1 7.6  ALBUMIN 4.2 4.1   No results for input(s): "LIPASE", "AMYLASE" in the last 168 hours. No results for input(s): "AMMONIA" in the last 168 hours. Coagulation Profile: Recent Labs  Lab 01/08/24 1333  INR 1.0   Cardiac Enzymes: No results for input(s): "CKTOTAL", "CKMB", "CKMBINDEX", "TROPONINI" in the last 168 hours. BNP (last 3 results) No results for input(s): "PROBNP" in the last 8760 hours. HbA1C: No results for input(s):  "HGBA1C" in the last 72 hours. CBG: Recent Labs  Lab 01/08/24 1331  GLUCAP 113*   Lipid Profile: No results for input(s): "CHOL", "HDL", "LDLCALC", "TRIG", "CHOLHDL", "LDLDIRECT" in the last 72 hours. Thyroid  Function Tests: No results for input(s): "TSH", "T4TOTAL", "FREET4", "T3FREE", "THYROIDAB" in the last 72 hours. Anemia Panel: No results for input(s): "VITAMINB12", "FOLATE", "FERRITIN", "TIBC", "IRON", "RETICCTPCT" in the last 72 hours. Urine analysis:    Component Value Date/Time   COLORURINE YELLOW 07/06/2023 1104   APPEARANCEUR CLEAR 07/06/2023 1104   LABSPEC >=1.030 07/06/2023 1104   PHURINE 6.0 07/06/2023 1104   GLUCOSEU NEGATIVE 07/06/2023 1104   HGBUR NEGATIVE 07/06/2023 1104   BILIRUBINUR NEGATIVE 07/06/2023 1104   KETONESUR NEGATIVE 07/06/2023 1104   PROTEINUR NEGATIVE 07/06/2023 1104   NITRITE NEGATIVE 07/06/2023 1104   LEUKOCYTESUR NEGATIVE 07/06/2023 1104   Sepsis Labs: !!!!!!!!!!!!!!!!!!!!!!!!!!!!!!!!!!!!!!!!!!!! @LABRCNTIP (procalcitonin:4,lacticidven:4) )No results found for this or any previous visit (from the past 240 hours).   Radiological Exams on Admission: CT HEAD CODE STROKE WO CONTRAST Result Date: 01/08/2024 CLINICAL DATA:  Code stroke.  Right-sided weakness EXAM: CT HEAD WITHOUT CONTRAST TECHNIQUE: Contiguous axial images were obtained from the base of the skull through the vertex without intravenous contrast. RADIATION DOSE REDUCTION: This exam was performed according to the departmental dose-optimization program which includes automated exposure control, adjustment of the mA and/or kV according to patient size and/or use of iterative reconstruction technique. COMPARISON:  12/29/2021 CT head FINDINGS: Brain: No evidence of acute infarction, hemorrhage, mass, mass effect, or midline shift. No hydrocephalus or extra-axial collection. Partial empty sella. Normal craniocervical junction. Vascular: No hyperdense vessel. Skull: Negative for fracture or  focal lesion. Sinuses/Orbits: Mucous retention cysts in the maxillary sinuses. No acute finding in the orbits. Other: The mastoid air cells are well aerated. ASPECTS [facility] Stroke Program Early CT Score) - Ganglionic level infarction (caudate, lentiform nuclei, internal capsule, insula, M1-M3 cortex): 7 - Supraganglionic infarction (M4-M6 cortex): 3 Total score (0-10 with 10 being normal): 10 IMPRESSION: No acute intracranial process. ASPECTS is 10. Code stroke imaging results were communicated on 01/08/2024 at 1:45 pm to provider TEE via telephone, who verbally acknowledged these results. Electronically Signed   By: Zoila Hines M.D.   On: 01/08/2024 13:46    Nutritional status  All images have been reviewed  by me personally.  EKG: Independently reviewed.  Nonischemic  Assessment/Plan Principal Problem:   Weakness of right upper extremity   Right upper extremity weakness and dizziness - There is concerns of TIA/CVA but this could certainly be related to neuropathy due to his chemotherapy.  CT of the head is negative.  Not a candidate for any thrombectomy, seen by teleneurology. -Check MRI brain, CTA head and neck per neurology.  Okay to admit at [facility] - Check A1c, lipid panel, B12, folate, TSH -Permissive hypertension for now.  Hypocalcemia - IV calcium  gluconate  Iron deficiency anemia - Continue outpatient regimen  Neutropenia, chronic - This is chemotherapy related  Depression/anxiety/insomnia - Continue home medication including Wellbutrin , Cymbalta , Lunesta /formulary substitute once confirmed by pharmacy  DVT prophylaxis: Lovenox  Code Status: Full code code Family Communication: Mother and spouse at bedside Consults called: Telemetry neurology, seen by Dr. Janett Medin Admission status: MedSurg admission  Status is: Observation The patient remains OBS appropriate and will d/c before 2 midnights.   Time Spent: 65 minutes.  >50% of the time was devoted to discussing the  patients care, assessment, plan and disposition with other care givers along with counseling the patient about the risks and benefits of treatment.    Maggie Schooner MD [facility]  If 7PM-7AM, please contact night-coverage   01/08/2024, 3:07 PM

## 2024-01-09 DIAGNOSIS — R29898 Other symptoms and signs involving the musculoskeletal system: Secondary | ICD-10-CM | POA: Diagnosis not present

## 2024-01-09 LAB — BASIC METABOLIC PANEL
Anion gap: 12 (ref 5–15)
BUN: 13 mg/dL (ref 6–20)
CO2: 19 mmol/L — ABNORMAL LOW (ref 22–32)
Calcium: 9 mg/dL (ref 8.9–10.3)
Chloride: 102 mmol/L (ref 98–111)
Creatinine, Ser: 0.74 mg/dL (ref 0.61–1.24)
GFR, Estimated: >60 mL/min (ref >60)
Glucose, Bld: 128 mg/dL — ABNORMAL HIGH (ref 70–99)
Potassium: 3.8 mmol/L (ref 3.5–5.1)
Sodium: 133 mmol/L — ABNORMAL LOW (ref 135–145)

## 2024-01-09 LAB — CBC
HCT: 33.6 % — ABNORMAL LOW (ref 39.0–52.0)
Hemoglobin: 10.9 g/dL — ABNORMAL LOW (ref 13.0–17.0)
MCH: 27 pg (ref 26.0–34.0)
MCHC: 32.4 g/dL (ref 30.0–36.0)
MCV: 83.2 fL (ref 80.0–100.0)
Platelets: 299 10*3/uL (ref 150–400)
RBC: 4.04 MIL/uL — ABNORMAL LOW (ref 4.22–5.81)
RDW: 21.3 % — ABNORMAL HIGH (ref 11.5–15.5)
WBC: 5.7 10*3/uL (ref 4.0–10.5)
nRBC: 0 % (ref 0.0–0.2)

## 2024-01-09 LAB — PHOSPHORUS: Phosphorus: 3.6 mg/dL (ref 2.5–4.6)

## 2024-01-09 LAB — FOLATE: Folate: 40 ng/mL (ref >5.9)

## 2024-01-09 LAB — MAGNESIUM: Magnesium: 2.1 mg/dL (ref 1.7–2.4)

## 2024-01-09 MED ORDER — VITAMIN D (ERGOCALCIFEROL) 1.25 MG (50000 UNIT) PO CAPS: 50000.0000 [IU] | ORAL_CAPSULE | ORAL | 0 refills | Status: AC

## 2024-01-09 MED ORDER — VITAMIN D 25 MCG (1000 UNIT) PO TABS: 1000.0000 [IU] | ORAL_TABLET | Freq: Every day | ORAL | Status: DC

## 2024-01-09 MED ORDER — HEPARIN SOD (PORK) LOCK FLUSH 100 UNIT/ML IV SOLN
500.0000 [IU] | INTRAVENOUS | Status: AC | PRN
  Administered 2024-01-09: 500 [IU]
  Filled 2024-01-09: qty 5

## 2024-01-09 MED ORDER — VITAMIN D (ERGOCALCIFEROL) 1.25 MG (50000 UNIT) PO CAPS
50000.0000 [IU] | ORAL_CAPSULE | ORAL | Status: DC
  Administered 2024-01-09: 50000 [IU] via ORAL
  Filled 2024-01-09: qty 1

## 2024-01-09 NOTE — Discharge Summary (Signed)
Physician Discharge Summary  Cory Gomez EAV:[id number] DOB: 03-25-1981 DOA: 01/08/2024  PCP: [name], MD  Admit date: 01/08/2024 Discharge date: 01/09/2024  Admitted From: Home Disposition: Home  Recommendations for Outpatient Follow-up:  Follow up with PCP in 1-2 weeks Please obtain BMP/CBC in one week your next doctors visit.  Follow-up outpatient oncology   Discharge Condition: Stable CODE STATUS: Full code Diet recommendation: Regular  Brief/Interim Summary: Brief Narrative:   43 y.o. male with medical history significant of colon cancer status post resection in October 2024 currently on chemotherapy which was started in October 2024, anxiety/depression sent to the hospital for evaluation of dizziness and right-sided weakness. While patient was receiving his chemotherapy today, about 1 hour into his treatment he became very dizzy and started experiencing right-sided weakness.  Initially code stroke was called, CT of the head was negative.  Routine blood work showed hypocalcemia otherwise unremarkable.  Neurology recommended admitting the patient for stroke rule out.  Okay to admit this patient to [facility], discussed with Dr. Pearlean Brownie.    Initially patient right upper extremity symptoms significantly improved and dizziness resolved over neck several hours.  MRI brain, CTA head and neck was unremarkable.  Patient was seen by neurology team.  A1c, lipid panel, folate, B12, TSH was unremarkable.  Vitamin D was slightly low therefore given supplements.  Will discharge him today in stable condition  Assessment & Plan:  Principal Problem:   Weakness of right upper extremity     Right upper extremity weakness and dizziness, resolved - The symptoms have resolved.  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  CT head, CTA head and neck, MRI brain are unremarkable.  A1c, lipid panel, B12, folate, TSH are normal.  Vitamin D slightly low -Patient has been seen by  neurology   Hypocalcemia - Repleted  Vitamin D deficiency - Started supplements   Iron deficiency anemia - Continue outpatient regimen   Neutropenia, chronic - This is chemotherapy related   Depression/anxiety/insomnia - Resume home medications   DVT prophylaxis: Lovenox Code Status: Full code code Family Communication:  Disposition-discharge today    Subjective: Feeling well wishing to go home   Examination:  General exam: Appears calm and comfortable  Respiratory system: Clear to auscultation. Respiratory effort normal. Cardiovascular system: S1 & S2 heard, RRR. No JVD, murmurs, rubs, gallops or clicks. No pedal edema. Gastrointestinal system: Abdomen is nondistended, soft and nontender. No organomegaly or masses felt. Normal bowel sounds heard. Central nervous system: Alert and oriented. No focal neurological deficits. Extremities: Symmetric 5 x 5 power. Skin: No rashes, lesions or ulcers Psychiatry: Judgement and insight appear normal. Mood & affect appropriate.    Discharge Diagnoses:  Principal Problem:   Weakness of right upper extremity      Discharge Exam: Vitals:   01/09/24 0503 01/09/24 0949  BP: (!) 151/93 (!) 159/95  Pulse: 79 94  Resp: 18   Temp: 98.2 F (36.8 C) 98.1 F (36.7 C)  SpO2: 96% 98%   Vitals:   01/08/24 2138 01/09/24 0211 01/09/24 0503 01/09/24 0949  BP: (!) 154/94 (!) 140/82 (!) 151/93 (!) 159/95  Pulse: 90 84 79 94  Resp: 18 20 18    Temp: 97.7 F (36.5 C) (!) 97.4 F (36.3 C) 98.2 F (36.8 C) 98.1 F (36.7 C)  TempSrc:  Oral    SpO2: 98% 95% 96% 98%  Weight:      Height:          Discharge Instructions  Allergies as of 01/09/2024       Reactions   Ambien [zolpidem] Other (See Comments)   Pt requests we do not give this medication.         Medication List     STOP taking these medications    oxyCODONE 5 MG immediate release tablet Commonly known as: Oxy IR/ROXICODONE       TAKE these  medications    buPROPion 150 MG 24 hr tablet Commonly known as: Wellbutrin XL Take 1 tablet (150 mg total) by mouth every morning.   DULoxetine 60 MG capsule Commonly known as: Cymbalta Take 1 capsule (60 mg total) by mouth 2 (two) times daily.   eszopiclone 2 MG Tabs tablet Commonly known as: Lunesta Take 1 tablet (2 mg total) by mouth at bedtime as needed for sleep. Take immediately before bedtime   lidocaine-prilocaine cream Commonly known as: EMLA Apply to affected area once What changed:  how much to take how to take this when to take this reasons to take this additional instructions   ondansetron 8 MG disintegrating tablet Commonly known as: ZOFRAN-ODT Take 1 tablet (8 mg total) by mouth every 8 (eight) hours as needed for nausea or vomiting.   prochlorperazine 10 MG tablet Commonly known as: COMPAZINE Take 1 tablet (10 mg total) by mouth every 6 (six) hours as needed for nausea or vomiting. What changed: additional instructions   propranolol 10 MG tablet Commonly known as: INDERAL Take 1 tablet (10 mg total) by mouth 2 (two) times daily as needed.   TYLENOL PO Take 500 mg by mouth every 6 (six) hours.   Vitamin D (Ergocalciferol) 1.25 MG (50000 UNIT) Caps capsule Commonly known as: DRISDOL Take 1 capsule (50,000 Units total) by mouth every 7 (seven) days for 4 doses. Start taking on: January 16, 2024        Follow-up Information     [name], MD Follow up in 1 week(s).   Specialty: Family Medicine Contact information: [address] [place] [address] [phone]                Allergies  Allergen Reactions   Ambien [Zolpidem] Other (See Comments)    Pt requests we do not give this medication.     You were cared for by a hospitalist during your hospital stay. If you have any questions about your discharge medications or the care you received while you were in the hospital after you are discharged, you can call the unit and asked  to speak with the hospitalist on call if the hospitalist that took care of you is not available. Once you are discharged, your primary care physician will handle any further medical issues. Please note that no refills for any discharge medications will be authorized once you are discharged, as it is imperative that you return to your primary care physician (or establish a relationship with a primary care physician if you do not have one) for your aftercare needs so that they can reassess your need for medications and monitor your lab values.  You were cared for by a hospitalist during your hospital stay. If you have any questions about your discharge medications or the care you received while you were in the hospital after you are discharged, you can call the unit and asked to speak with the hospitalist on call if the hospitalist that took care of you is not available. Once you are discharged, your primary care physician will handle any further medical issues.  Please note that NO REFILLS for any discharge medications will be authorized once you are discharged, as it is imperative that you return to your primary care physician (or establish a relationship with a primary care physician if you do not have one) for your aftercare needs so that they can reassess your need for medications and monitor your lab values.  Please request your Prim.MD to go over all Hospital Tests and Procedure/Radiological results at the follow up, please get all Hospital records sent to your Prim MD by signing hospital release before you go home.  Get CBC, CMP, 2 view Chest X Brittney checked  by Primary MD during your next visit or SNF MD in 5-7 days ( we routinely change or add medications that can affect your baseline labs and fluid status, therefore we recommend that you get the mentioned basic workup next visit with your PCP, your PCP may decide not to get them or add new tests based on their clinical decision)  On your next visit  with your primary care physician please Get Medicines reviewed and adjusted.  If you experience worsening of your admission symptoms, develop shortness of breath, life threatening emergency, suicidal or homicidal thoughts you must seek medical attention immediately by calling 911 or calling your MD immediately  if symptoms less severe.  You Must read complete instructions/literature along with all the possible adverse reactions/side effects for all the Medicines you take and that have been prescribed to you. Take any new Medicines after you have completely understood and accpet all the possible adverse reactions/side effects.   Do not drive, operate heavy machinery, perform activities at heights, swimming or participation in water activities or provide baby sitting services if your were admitted for syncope or siezures until you have seen by Primary MD or a Neurologist and advised to do so again.  Do not drive when taking Pain medications.   Procedures/Studies: CT ANGIO HEAD NECK W WO CM Result Date: 01/08/2024 CLINICAL DATA:  Dizziness, stroke suspected EXAM: CT ANGIOGRAPHY HEAD AND NECK WITH AND WITHOUT CONTRAST TECHNIQUE: Multidetector CT imaging of the head and neck was performed using the standard protocol during bolus administration of intravenous contrast. Multiplanar CT image reconstructions and MIPs were obtained to evaluate the vascular anatomy. Carotid stenosis measurements (when applicable) are obtained utilizing NASCET criteria, using the distal internal carotid diameter as the denominator. RADIATION DOSE REDUCTION: This exam was performed according to the departmental dose-optimization program which includes automated exposure control, adjustment of the mA and/or kV according to patient size and/or use of iterative reconstruction technique. CONTRAST:  80mL OMNIPAQUE IOHEXOL 350 MG/ML SOLN COMPARISON:  01/08/2024 CT head, 12/30/2021 CTA head FINDINGS: CT HEAD FINDINGS Brain: No evidence of  acute infarct, hemorrhage, mass, mass effect, or midline shift. No hydrocephalus or extra-axial fluid collection. Vascular: No hyperdense vessel. Skull: Negative for fracture or focal lesion. Sinuses/Orbits: No acute finding. CTA NECK FINDINGS Aortic arch: Standard branching. Imaged portion shows no evidence of aneurysm or dissection. No significant stenosis of the major arch vessel origins. Right carotid system: No evidence of dissection, occlusion, or hemodynamically significant stenosis (greater than 50%). Left carotid system: No evidence of dissection, occlusion, or hemodynamically significant stenosis (greater than 50%). Vertebral arteries: No evidence of dissection, occlusion, or hemodynamically significant stenosis (greater than 50%). Skeleton: No acute osseous abnormality. Degenerative changes in the cervical spine. Other neck: No acute finding. Upper chest: No focal pulmonary opacity or pleural effusion. Review of the MIP images confirms the above findings CTA HEAD  FINDINGS Anterior circulation: Both internal carotid arteries are patent to the termini, without significant stenosis. A1 segments patent. Normal anterior communicating artery. Anterior cerebral arteries are patent to their distal aspects without significant stenosis. No M1 stenosis or occlusion. MCA branches perfused to their distal aspects without significant stenosis. Posterior circulation: Vertebral arteries patent to the vertebrobasilar junction without significant stenosis. Posterior inferior cerebellar arteries patent proximally. Basilar patent to its distal aspect without significant stenosis. Superior cerebellar arteries patent proximally. Patent P1 segments. PCAs perfused to their distal aspects without significant stenosis. The bilateral posterior communicating arteries are not visualized. Venous sinuses: As permitted by contrast timing, patent. Anatomic variants: None significant. No evidence of aneurysm or vascular malformation.  Review of the MIP images confirms the above findings IMPRESSION: 1. No acute intracranial process. 2. No intracranial large vessel occlusion or significant stenosis. 3. No hemodynamically significant stenosis in the neck. Electronically Signed   By: Wiliam Ke M.D.   On: 01/08/2024 19:11   MR BRAIN WO CONTRAST Result Date: 01/08/2024 CLINICAL DATA:  New onset dizziness, right-sided weakness EXAM: MRI HEAD WITHOUT CONTRAST TECHNIQUE: Multiplanar, multiecho pulse sequences of the brain and surrounding structures were obtained without intravenous contrast. COMPARISON:  No prior MRI available 12/30/2021 MRI head, correlation is also made with 01/08/2024 CT head on FINDINGS: Brain: No restricted diffusion to suggest acute or subacute infarct. No acute hemorrhage, mass, mass effect, or midline shift. No hydrocephalus or extra-axial collection. Pituitary and craniocervical junction within normal limits. No hemosiderin deposition to suggest remote hemorrhage. Vascular: Normal arterial flow voids. Skull and upper cervical spine: Normal marrow signal. Sinuses/Orbits: Mucous retention cysts in the maxillary sinuses. Mucosal thickening in the ethmoid air cells. No acute finding in the orbits. Other: The mastoid air cells are well aerated. Fluid in the right petrous apex. IMPRESSION: No acute intracranial process. No evidence of acute or subacute infarct. Electronically Signed   By: Wiliam Ke M.D.   On: 01/08/2024 19:08   CT HEAD CODE STROKE WO CONTRAST Result Date: 01/08/2024 CLINICAL DATA:  Code stroke.  Right-sided weakness EXAM: CT HEAD WITHOUT CONTRAST TECHNIQUE: Contiguous axial images were obtained from the base of the skull through the vertex without intravenous contrast. RADIATION DOSE REDUCTION: This exam was performed according to the departmental dose-optimization program which includes automated exposure control, adjustment of the mA and/or kV according to patient size and/or use of iterative  reconstruction technique. COMPARISON:  12/29/2021 CT head FINDINGS: Brain: No evidence of acute infarction, hemorrhage, mass, mass effect, or midline shift. No hydrocephalus or extra-axial collection. Partial empty sella. Normal craniocervical junction. Vascular: No hyperdense vessel. Skull: Negative for fracture or focal lesion. Sinuses/Orbits: Mucous retention cysts in the maxillary sinuses. No acute finding in the orbits. Other: The mastoid air cells are well aerated. ASPECTS [facility] Stroke Program Early CT Score) - Ganglionic level infarction (caudate, lentiform nuclei, internal capsule, insula, M1-M3 cortex): 7 - Supraganglionic infarction (M4-M6 cortex): 3 Total score (0-10 with 10 being normal): 10 IMPRESSION: No acute intracranial process. ASPECTS is 10. Code stroke imaging results were communicated on 01/08/2024 at 1:45 pm to provider TEE via telephone, who verbally acknowledged these results. Electronically Signed   By: Wiliam Ke M.D.   On: 01/08/2024 13:46     The results of significant diagnostics from this hospitalization (including imaging, microbiology, ancillary and laboratory) are listed below for reference.     Microbiology: No results found for this or any previous visit (from the past 240 hours).   Labs: BNP (last  3 results) No results for input(s): "BNP" in the last 8760 hours. Basic Metabolic Panel: Recent Labs  Lab 01/08/24 1007 01/08/24 1333 01/08/24 1340 01/09/24 0250  NA 139 135 137 133*  K 3.6 3.9 4.0 3.8  CL 107 103 105 102  CO2 27 23  --  19*  GLUCOSE 110* 114* 115* 128*  BUN 9 10 8 13   CREATININE 0.72 0.75 0.70 0.74  CALCIUM 9.0 9.2  --  9.0  MG  --   --   --  2.1  PHOS  --   --   --  3.6   Liver Function Tests: Recent Labs  Lab 01/08/24 1007 01/08/24 1333  AST 26 36  ALT 29 34  ALKPHOS 67 69  BILITOT 0.3 0.4  PROT 7.1 7.6  ALBUMIN 4.2 4.1   No results for input(s): "LIPASE", "AMYLASE" in the last 168 hours. No results for input(s):  "AMMONIA" in the last 168 hours. CBC: Recent Labs  Lab 01/08/24 1007 01/08/24 1333 01/08/24 1340 01/09/24 0250  WBC 2.9* 3.5*  --  5.7  NEUTROABS 0.9* 1.9  --   --   HGB 11.2* 11.6* 11.6* 10.9*  HCT 34.2* 36.8* 34.0* 33.6*  MCV 80.7 84.8  --  83.2  PLT 249 287  --  299   Cardiac Enzymes: No results for input(s): "CKTOTAL", "CKMB", "CKMBINDEX", "TROPONINI" in the last 168 hours. BNP: Invalid input(s): "POCBNP" CBG: Recent Labs  Lab 01/08/24 1331  GLUCAP 113*   D-Dimer No results for input(s): "DDIMER" in the last 72 hours. Hgb A1c Recent Labs    01/08/24 1333  HGBA1C 5.8*   Lipid Profile Recent Labs    01/08/24 1333  CHOL 158  HDL 39*  LDLCALC 46  TRIG 784*  CHOLHDL 4.1   Thyroid function studies Recent Labs    01/08/24 1534  TSH 1.160   Anemia work up Recent Labs    01/08/24 1333 01/09/24 0250  VITAMINB12 428  --   FOLATE  --  40.0   Urinalysis    Component Value Date/Time   COLORURINE STRAW (A) 01/08/2024 1447   APPEARANCEUR CLEAR 01/08/2024 1447   LABSPEC 1.010 01/08/2024 1447   PHURINE 6.0 01/08/2024 1447   GLUCOSEU NEGATIVE 01/08/2024 1447   HGBUR NEGATIVE 01/08/2024 1447   BILIRUBINUR NEGATIVE 01/08/2024 1447   KETONESUR NEGATIVE 01/08/2024 1447   PROTEINUR NEGATIVE 01/08/2024 1447   NITRITE NEGATIVE 01/08/2024 1447   LEUKOCYTESUR NEGATIVE 01/08/2024 1447   Sepsis Labs Recent Labs  Lab 01/08/24 1007 01/08/24 1333 01/09/24 0250  WBC 2.9* 3.5* 5.7   Microbiology No results found for this or any previous visit (from the past 240 hours).   Time coordinating discharge:  I have spent 35 minutes face to face with the patient and on the ward discussing the patients care, assessment, plan and disposition with other care givers. >50% of the time was devoted counseling the patient about the risks and benefits of treatment/Discharge disposition and coordinating care.   SIGNED:   Miguel Rota, MD  [facility] 01/09/2024, 12:50  PM   If 7PM-7AM, please contact night-coverage

## 2024-01-09 NOTE — Hospital Course (Addendum)
Brief Narrative:   43 y.o. male with medical history significant of colon cancer status post resection in October 2024 currently on chemotherapy which was started in October 2024, anxiety/depression sent to the hospital for evaluation of dizziness and right-sided weakness. While patient was receiving his chemotherapy today, about 1 hour into his treatment he became very dizzy and started experiencing right-sided weakness.  Initially code stroke was called, CT of the head was negative.  Routine blood work showed hypocalcemia otherwise unremarkable.  Neurology recommended admitting the patient for stroke rule out.  Okay to admit this patient to [facility], discussed with Dr. Pearlean Brownie.    Initially patient right upper extremity symptoms significantly improved and dizziness resolved over neck several hours.  MRI brain, CTA head and neck was unremarkable.  Patient was seen by neurology team.  A1c, lipid panel, folate, B12, TSH was unremarkable.  Vitamin D was slightly low therefore given supplements.  Will discharge him today in stable condition  Assessment & Plan:  Principal Problem:   Weakness of right upper extremity     Right upper extremity weakness and dizziness, resolved - The symptoms have resolved.  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  CT head, CTA head and neck, MRI brain are unremarkable.  A1c, lipid panel, B12, folate, TSH are normal.  Vitamin D slightly low -Patient has been seen by neurology   Hypocalcemia - Repleted  Vitamin D deficiency - Started supplements   Iron deficiency anemia - Continue outpatient regimen   Neutropenia, chronic - This is chemotherapy related   Depression/anxiety/insomnia - Resume home medications   DVT prophylaxis: Lovenox Code Status: Full code code Family Communication:  Disposition-discharge today    Subjective: Feeling well wishing to go home   Examination:  General exam: Appears calm and comfortable  Respiratory  system: Clear to auscultation. Respiratory effort normal. Cardiovascular system: S1 & S2 heard, RRR. No JVD, murmurs, rubs, gallops or clicks. No pedal edema. Gastrointestinal system: Abdomen is nondistended, soft and nontender. No organomegaly or masses felt. Normal bowel sounds heard. Central nervous system: Alert and oriented. No focal neurological deficits. Extremities: Symmetric 5 x 5 power. Skin: No rashes, lesions or ulcers Psychiatry: Judgement and insight appear normal. Mood & affect appropriate.

## 2024-01-09 NOTE — Progress Notes (Signed)
Discharge education performed with the patient and his parent. They were educated on s/s of stroke and on calling 911 immediately. They were also educated on new medication administration, s/s requiring a call to the provider, and on following up with his PCP. All of their questions and concerns were addressed to their satisfaction. Patient refusing d/c transport and opting to walk out.

## 2024-01-09 NOTE — Progress Notes (Signed)
   01/09/24 1013  TOC Brief Assessment  Insurance and Status Reviewed  Patient has primary care physician Yes  Home environment has been reviewed Home with spouse  Prior level of function: Independent  Prior/Current Home Services No current home services  Social Drivers of Health Review SDOH reviewed no interventions necessary  Readmission risk has been reviewed Yes  Transition of care needs no transition of care needs at this time

## 2024-01-09 NOTE — Plan of Care (Signed)

## 2024-01-10 ENCOUNTER — Inpatient Hospital Stay: Payer: BC Managed Care – PPO | Admitting: Family Medicine

## 2024-01-10 ENCOUNTER — Inpatient Hospital Stay: Payer: BC Managed Care – PPO

## 2024-01-15 NOTE — Assessment & Plan Note (Signed)
-  pT4aN1M0 stage IIIB, MMS, G2 -Presented with perforated diverticulitis in sigmoid colon in July, 2024. -Patient underwent laparoscopic resection of sigmoid colon, path reviewed moderate differentiated adenocarcinoma with 1 positive lymph nodes. -I recommend adjuvant chemotherapy FOLFOX for 6 months if he can tolerate.He started on 10/23/2023

## 2024-01-16 ENCOUNTER — Inpatient Hospital Stay (HOSPITAL_BASED_OUTPATIENT_CLINIC_OR_DEPARTMENT_OTHER): Payer: BC Managed Care – PPO | Admitting: Hematology

## 2024-01-16 ENCOUNTER — Inpatient Hospital Stay: Payer: BC Managed Care – PPO | Admitting: Family Medicine

## 2024-01-16 DIAGNOSIS — C187 Malignant neoplasm of sigmoid colon: Secondary | ICD-10-CM

## 2024-01-16 NOTE — Progress Notes (Signed)
[facility]   [phone] [phone]   Clinic Follow up Note   Patient Care Team: [name], MD as PCP - General (Family Medicine) Karie Soda, MD as Consulting Physician (General Surgery) Hilarie Fredrickson, MD as Consulting Physician (Gastroenterology) Malachy Mood, MD as Consulting Physician (Medical Oncology) Suanne Marker, MD as Consulting Physician (Neurology) Laren Boom, DO as Consulting Physician (Otolaryngology) 01/16/2024  I connected with Cory Gomez on 01/16/24 at  9:00 AM EST by telephone and verified that I am speaking with the correct person using two identifiers.   I discussed the limitations, risks, security and privacy concerns of performing an evaluation and management service by telephone and the availability of in person appointments. I also discussed with the patient that there may be a patient responsible charge related to this service. The patient expressed understanding and agreed to proceed.   Patient's location: Home Provider's location:  Office    CHIEF COMPLAINT: Follow-up for recent hospital admission   CURRENT THERAPY: Adjuvant chemotherapy FOLFOX  Oncology history Cancer of sigmoid colon - pT4a, pN1a - s/p roboric LAR resection 09/27/2023 -pT4aN1M0 stage IIIB, MMS, G2 -Presented with perforated diverticulitis in sigmoid colon in July, 2024. -Patient underwent laparoscopic resection of sigmoid colon, path reviewed moderate differentiated adenocarcinoma with 1 positive lymph nodes. -I recommend adjuvant chemotherapy FOLFOX for 6 months if he can tolerate.He started on 10/23/2023  Assessment and Plan    Stage III Colon Cancer Currently undergoing adjuvant chemotherapy with oxaliplatin. Experienced dizziness and confusion during the last infusion, initially concerning for a stroke, but imaging ruled out a stroke. 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 Discussed risks and reassured by negative imaging results. Agreed to continue chemotherapy with additional premedication and extended infusion duration. - Continue chemotherapy with oxaliplatin - Add premedication to prevent allergic reactions - Extend infusion duration to three hours - Monitor closely during infusion - Next chemotherapy session scheduled for January 30, 2024   Follow-up - Next appointment scheduled for January 30, 2024 - Request to add one hour to the treatment duration for closer monitoring.  I also added Pepcid and Claritin as the premeds before oxaliplatin. -Follow-up on February 6        SUMMARY OF ONCOLOGIC HISTORY: Oncology History  Cancer of sigmoid colon - pT4a, pN1a - s/p roboric LAR resection 09/27/2023  09/25/2023 Cancer Staging   Staging form: Colon and Rectum, AJCC 8th Edition - Pathologic stage from 09/25/2023: Stage IIIB (pT4a, pN1a, cM0) - Signed by Carlean Jews, NP on 10/09/2023 Total positive nodes: 1 Histologic grading system: 4 grade system Histologic grade (G): G2 Residual tumor (R): R0 - None   09/25/2023 Surgery   Laparoscopic resection of sigmoid colon  Invasive, moderately differentiated adenocarcinoma of sigmoid colon. Invades into werosal surface. Margins are clear. Negative lymphovascular and perineural invasion. Metastasis to 1/45 lymph nodes.    09/25/2023 Pathology Results   FINAL MICROSCOPIC DIAGNOSIS:  A.   COLON, RESECTION: - Invasive moderately differentiated adenocarcinoma, 7.8 cm involving sigmoid colon - Carcinoma invades into the serosal surface - Resection margins are negative for carcinoma - Negative for lymphovascular or perineural invasion - Metastatic carcinoma to one of forty-five lymph nodes (1/45) - See oncology table B.   COLON, DISTAL MARGIN, EXCISION: - Colonic donut within normal limits, negative for carcinoma  ONCOLOGY TABLE:  COLON AND RECTUM, CARCINOMA:  Resection, Including Transanal  Disk Excision of Rectal Neoplasms  Procedure: Resection, rectosigmoid colon  Tumor Site: Sigmoid colon Tumor Size: 7.8 cm Macroscopic Tumor Perforation: Not identified  Histologic Type: Adenocarcinoma Histologic Grade: G2: Moderately differentiated Multiple Primary Sites: Not applicable Tumor Extension: Carcinoma invades into the serosal surface Lymphovascular Invasion: Not identified Perineural Invasion: Not identified Treatment Effect: No known presurgical therapy Margins:      Margin Status for Invasive Carcinoma: All margins negative for invasive carcinoma      Margin Status for Non-Invasive Tumor: All margins negative for high-grade dysplasia / intramucosal           carcinoma and low-grade dysplasia Regional Lymph Nodes:      Number of Lymph Nodes with Tumor: 1      Number of Lymph Nodes Examined: 45 Tumor Deposits: Not identified Distant Metastasis:      Distant Site(s) Involved: Not applicable Pathologic Stage Classification (pTNM, AJCC 8th Edition): pT4a, pN1a Ancillary Studies: MMR / MSI testing will be ordered. Representative Tumor Block: A2 Comments: None (v4.2.0.1)     09/30/2023 Initial Diagnosis   Cancer of sigmoid colon - pT4a, pN1a - s/p roboric LAR resection 09/27/2023   10/08/2023 Imaging   CT Chest with contrast  IMPRESSION: Negative. No evidence of thoracic metastatic disease or other significant abnormality.   10/23/2023 -  Chemotherapy   Patient is on Treatment Plan : COLORECTAL FOLFOX q14d x 6 months      Genetic Testing   [facility] CancerNext-Expanded Panel+RNA was Negative. Report date is 11/15/2023.   The CancerNext-Expanded gene panel offered by [facility] and includes sequencing, rearrangement, and RNA analysis for the following 71 genes: AIP, ALK, APC, ATM, AXIN2, BAP1, BARD1, BMPR1A, BRCA1, BRCA2, BRIP1, CDC73, CDH1, CDK4, CDKN1B, CDKN2A, CHEK2, CTNNA1, DICER1, FH, FLCN, KIF1B, LZTR1, MAX, MEN1, MET, MLH1, MSH2, MSH3, MSH6, MUTYH, NF1, NF2,  NTHL1, PALB2, PHOX2B, PMS2, POT1, PRKAR1A, PTCH1, PTEN, RAD51C, RAD51D, RB1, RET, SDHA, SDHAF2, SDHB, SDHC, SDHD, SMAD4, SMARCA4, SMARCB1, SMARCE1, STK11, SUFU, TMEM127, TP53, TSC1, TSC2, and VHL (sequencing and deletion/duplication); EGFR, EGLN1, HOXB13, KIT, MITF, PDGFRA, POLD1, and POLE (sequencing only); EPCAM and GREM1 (deletion/duplication only).       Discussed the use of AI scribe software for clinical note transcription with the patient, who gave verbal consent to proceed.  History of Present Illness   The patient, a 43 year old with stage 3 colon cancer on adjuvant chemotherapy, presents for a phone visit following a recent hospital admission. He reports waking from a nap feeling 'real dizzy' and subsequently being rushed to the ER. He was confused and initially concerned about a possible stroke or reaction to the chemotherapy drug, oxaliplatin. However, a scan ruled out a stroke and he saw a urologist who found no issues. The patient has experienced peripheral neuropathy in his hands and feet, a known side effect of oxaliplatin. He has not had any reactions to chemotherapy in the past. The patient's blood count was low, prompting a delay in his next chemotherapy treatment to allow his levels to build back up.         REVIEW OF SYSTEMS:   Constitutional: Denies fevers, chills or abnormal weight loss Eyes: Denies blurriness of vision Ears, nose, mouth, throat, and face: Denies mucositis or sore throat Respiratory: Denies cough, dyspnea or wheezes Cardiovascular: Denies palpitation, chest discomfort or lower extremity swelling Gastrointestinal:  Denies nausea, heartburn or change in bowel habits Skin: Denies abnormal skin rashes Lymphatics: Denies new lymphadenopathy or easy bruising Neurological:Denies numbness, tingling or new weaknesses Behavioral/Psych: Mood is stable, no new changes  All other systems were reviewed with  the patient and are negative.  MEDICAL HISTORY:  Past  Medical History:  Diagnosis Date   ADHD (attention deficit hyperactivity disorder)    Annual physical exam 09/09/2019   Anxiety and depression    Cancer (HCC)    Colon cancer (HCC)    COVID-19    12/28/19   Depression, recurrent (HCC) 09/09/2019   Headache 12/31/2021   History of kidney stones    Hypertriglyceridemia 07/17/2021   Insomnia    Inverted nipple 09/16/2019   Kidney stone    Mastoiditis of both sides    Nystagmus 01/22/2022   Obesity (BMI 30-39.9)    Obesity (BMI 30-39.9)    Suicidal ideation 07/14/2021   Vertigo     SURGICAL HISTORY: Past Surgical History:  Procedure Laterality Date   COLON RESECTION  09/25/2023   PORTACATH PLACEMENT Right 10/17/2023   Procedure: INSERTION PORT-A-CATH WITH Gaspar Skeeters GUIDANCE;  Surgeon: Karie Soda, MD;  Location: [facility];  Service: General;  Laterality: Right;   PROCTOSCOPY N/A 09/25/2023   Procedure: RIGID PROCTOSCOPY;  Surgeon: Karie Soda, MD;  Location: [facility];  Service: General;  Laterality: N/A;    I have reviewed the social history and family history with the patient and they are unchanged from previous note.  ALLERGIES:  is allergic to ambien [zolpidem].  MEDICATIONS:  Current Outpatient Medications  Medication Sig Dispense Refill   Acetaminophen (TYLENOL PO) Take 500 mg by mouth every 6 (six) hours.     buPROPion (WELLBUTRIN XL) 150 MG 24 hr tablet Take 1 tablet (150 mg total) by mouth every morning. 30 tablet 2   DULoxetine (CYMBALTA) 60 MG capsule Take 1 capsule (60 mg total) by mouth 2 (two) times daily. 60 capsule 2   eszopiclone (LUNESTA) 2 MG TABS tablet Take 1 tablet (2 mg total) by mouth at bedtime as needed for sleep. Take immediately before bedtime 30 tablet 1   lidocaine-prilocaine (EMLA) cream Apply to affected area once (Patient taking differently: Apply 1 Application topically as needed (for chemo port, apply three hours before treatment).) 30 g 3   ondansetron (ZOFRAN-ODT) 8 MG  disintegrating tablet Take 1 tablet (8 mg total) by mouth every 8 (eight) hours as needed for nausea or vomiting. 30 tablet 1   prochlorperazine (COMPAZINE) 10 MG tablet Take 1 tablet (10 mg total) by mouth every 6 (six) hours as needed for nausea or vomiting. (Patient taking differently: Take 10 mg by mouth every 6 (six) hours as needed for nausea or vomiting. Have not started) 30 tablet 1   propranolol (INDERAL) 10 MG tablet Take 1 tablet (10 mg total) by mouth 2 (two) times daily as needed. (Patient not taking: Reported on 01/08/2024) 60 tablet 2   Vitamin D, Ergocalciferol, (DRISDOL) 1.25 MG (50000 UNIT) CAPS capsule Take 1 capsule (50,000 Units total) by mouth every 7 (seven) days for 4 doses. 4 capsule 0   No current facility-administered medications for this visit.    PHYSICAL EXAMINATION: Not performed   LABORATORY DATA:  I have reviewed the data as listed    Latest Ref Rng & Units 01/09/2024    2:50 AM 01/08/2024    1:40 PM 01/08/2024    1:33 PM  CBC  WBC 4.0 - 10.5 K/uL 5.7   3.5   Hemoglobin 13.0 - 17.0 g/dL 40.3  47.4  25.9   Hematocrit 39.0 - 52.0 % 33.6  34.0  36.8   Platelets 150 - 400 K/uL 299   287  Latest Ref Rng & Units 01/09/2024    2:50 AM 01/08/2024    1:40 PM 01/08/2024    1:33 PM  CMP  Glucose 70 - 99 mg/dL 865  784  696   BUN 6 - 20 mg/dL 13  8  10    Creatinine 0.61 - 1.24 mg/dL 2.95  2.84  1.32   Sodium 135 - 145 mmol/L 133  137  135   Potassium 3.5 - 5.1 mmol/L 3.8  4.0  3.9   Chloride 98 - 111 mmol/L 102  105  103   CO2 22 - 32 mmol/L 19   23   Calcium 8.9 - 10.3 mg/dL 9.0   9.2   Total Protein 6.5 - 8.1 g/dL   7.6   Total Bilirubin 0.0 - 1.2 mg/dL   0.4   Alkaline Phos 38 - 126 U/L   69   AST 15 - 41 U/L   36   ALT 0 - 44 U/L   34       RADIOGRAPHIC STUDIES: I have personally reviewed the radiological images as listed and agreed with the findings in the report. No results found.     I discussed the assessment and treatment plan with  the patient. The patient was provided an opportunity to ask questions and all were answered. The patient agreed with the plan and demonstrated an understanding of the instructions.   The patient was advised to call back or seek an in-person evaluation if the symptoms worsen or if the condition fails to improve as anticipated.  I provided 22 minutes of non face-to-face telephone visit time during this encounter, and > 50% was spent counseling as documented under my assessment & plan.     Malachy Mood, MD 01/16/24

## 2024-01-16 NOTE — Addendum Note (Signed)
Addended by: Malachy Mood on: 01/16/2024 09:17 AM   Modules accepted: Level of Service

## 2024-01-20 ENCOUNTER — Inpatient Hospital Stay: Payer: BC Managed Care – PPO | Admitting: Family Medicine

## 2024-01-22 ENCOUNTER — Other Ambulatory Visit: Payer: BC Managed Care – PPO

## 2024-01-22 ENCOUNTER — Inpatient Hospital Stay: Payer: BC Managed Care – PPO | Admitting: Family Medicine

## 2024-01-22 ENCOUNTER — Ambulatory Visit: Payer: BC Managed Care – PPO

## 2024-01-22 ENCOUNTER — Ambulatory Visit: Payer: BC Managed Care – PPO | Admitting: Hematology

## 2024-01-23 DIAGNOSIS — C187 Malignant neoplasm of sigmoid colon: Secondary | ICD-10-CM | POA: Diagnosis not present

## 2024-01-25 ENCOUNTER — Encounter: Payer: Self-pay | Admitting: Hematology

## 2024-01-27 ENCOUNTER — Ambulatory Visit (INDEPENDENT_AMBULATORY_CARE_PROVIDER_SITE_OTHER): Payer: BC Managed Care – PPO | Admitting: Family Medicine

## 2024-01-27 ENCOUNTER — Encounter: Payer: Self-pay | Admitting: Family Medicine

## 2024-01-27 ENCOUNTER — Other Ambulatory Visit (HOSPITAL_COMMUNITY): Payer: Self-pay | Admitting: Psychiatry

## 2024-01-27 VITALS — BP 130/80 | HR 88 | Temp 98.5°F | Ht 74.0 in | Wt 297.0 lb

## 2024-01-27 DIAGNOSIS — T451X5A Adverse effect of antineoplastic and immunosuppressive drugs, initial encounter: Secondary | ICD-10-CM | POA: Insufficient documentation

## 2024-01-27 DIAGNOSIS — F411 Generalized anxiety disorder: Secondary | ICD-10-CM

## 2024-01-27 DIAGNOSIS — G620 Drug-induced polyneuropathy: Secondary | ICD-10-CM | POA: Diagnosis not present

## 2024-01-27 DIAGNOSIS — R7303 Prediabetes: Secondary | ICD-10-CM | POA: Diagnosis not present

## 2024-01-27 DIAGNOSIS — F39 Unspecified mood [affective] disorder: Secondary | ICD-10-CM | POA: Diagnosis not present

## 2024-01-27 DIAGNOSIS — E559 Vitamin D deficiency, unspecified: Secondary | ICD-10-CM | POA: Diagnosis not present

## 2024-01-27 NOTE — Assessment & Plan Note (Signed)
Chronic issue. Stable. Continue to monitor.

## 2024-01-27 NOTE — Assessment & Plan Note (Signed)
Chronic issue.  Stable.  Patient will continue Wellbutrin 150 mg daily and Cymbalta 90 mg daily.  He will continue to follow-up with his therapist and psychiatrist.

## 2024-01-27 NOTE — Progress Notes (Signed)
Marikay Alar, MD Phone: [phone]  Cory Gomez is a 43 y.o. male who presents today for follow-up.  Chemotherapy related neuropathy: Patient was getting chemotherapy and developed dizziness and right-sided numbness and weakness.  Code stroke was called and he was evaluated by neurology and admitted to the hospital.  CT head was negative.  Had negative MRI for acute issues.  Also had reassuring CT angiogram head and neck.  LDL was adequately controlled.  A1c was stable in the prediabetic range.  Vitamin D was found to be low and he was started on vitamin D supplementation.  He reports he is currently asymptomatic.  He is followed up with his oncologist and per their note it looks as though they felt this was related to neuropathy from his chemotherapy.  Depression/anxiety: Patient notes this is a little worse recently with what he had gone requiring the hospitalization.  He sees psychiatry monthly and follows with a therapist.  He is on Cymbalta and Wellbutrin.  No SI.  Social History   Tobacco Use  Smoking Status Never  Smokeless Tobacco Never    Current Outpatient Medications on File Prior to Visit  Medication Sig Dispense Refill   Acetaminophen (TYLENOL PO) Take 500 mg by mouth every 6 (six) hours.     buPROPion (WELLBUTRIN XL) 150 MG 24 hr tablet Take 1 tablet (150 mg total) by mouth every morning. 30 tablet 2   DULoxetine (CYMBALTA) 60 MG capsule Take 1 capsule (60 mg total) by mouth 2 (two) times daily. 60 capsule 2   eszopiclone (LUNESTA) 2 MG TABS tablet Take 1 tablet (2 mg total) by mouth at bedtime as needed for sleep. Take immediately before bedtime 30 tablet 1   lidocaine-prilocaine (EMLA) cream Apply to affected area once (Patient taking differently: Apply 1 Application topically as needed (for chemo port, apply three hours before treatment).) 30 g 3   ondansetron (ZOFRAN-ODT) 8 MG disintegrating tablet Take 1 tablet (8 mg total) by mouth every 8 (eight) hours as  needed for nausea or vomiting. 30 tablet 1   prochlorperazine (COMPAZINE) 10 MG tablet Take 1 tablet (10 mg total) by mouth every 6 (six) hours as needed for nausea or vomiting. (Patient taking differently: Take 10 mg by mouth every 6 (six) hours as needed for nausea or vomiting. Have not started) 30 tablet 1   propranolol (INDERAL) 10 MG tablet Take 1 tablet (10 mg total) by mouth 2 (two) times daily as needed. 60 tablet 2   Vitamin D, Ergocalciferol, (DRISDOL) 1.25 MG (50000 UNIT) CAPS capsule Take 1 capsule (50,000 Units total) by mouth every 7 (seven) days for 4 doses. 4 capsule 0   No current facility-administered medications on file prior to visit.     ROS see history of present illness  Objective  Physical Exam Vitals:   01/27/24 0839  BP: 130/80  Pulse: 88  Temp: 98.5 F (36.9 C)  SpO2: 99%    BP Readings from Last 3 Encounters:  01/27/24 130/80  01/09/24 (!) 159/95  01/08/24 (!) 158/103   Wt Readings from Last 3 Encounters:  01/27/24 297 lb (134.7 kg)  01/08/24 294 lb 9.6 oz (133.6 kg)  01/08/24 294 lb 9.6 oz (133.6 kg)    Physical Exam Constitutional:      General: He is not in acute distress.    Appearance: He is not diaphoretic.  Cardiovascular:     Rate and Rhythm: Normal rate and regular rhythm.     Heart sounds: Normal  heart sounds.  Pulmonary:     Effort: Pulmonary effort is normal.     Breath sounds: Normal breath sounds.  Skin:    General: Skin is warm and dry.  Neurological:     Mental Status: He is alert.     Comments: CN 3-12 intact, 5/5 strength in bilateral biceps, triceps, grip, quads, hamstrings, plantar and dorsiflexion, sensation to light touch intact in bilateral UE and LE, normal gait      Assessment/Plan: Please see individual problem list.  Vitamin D deficiency Assessment & Plan: Chronic issue.  Patient will continue vitamin D 50,000 units weekly.  Recheck in 8 weeks.  Orders: -     VITAMIN D 25 Hydroxy (Vit-D Deficiency,  Fractures); Future  Mood disorder [facility]) Assessment & Plan: Chronic issue.  Stable.  Patient will continue Wellbutrin 150 mg daily and Cymbalta 90 mg daily.  He will continue to follow-up with his therapist and psychiatrist.   Prediabetes Assessment & Plan: Chronic issue.  Stable.  Continue to monitor.   Chemotherapy-induced neuropathy [facility]) Assessment & Plan: Per patient's oncologist symptoms were likely related to chemotherapy induced neuropathy.  Patient will continue to see his oncologist and they will continue to determine appropriate management for his cancer.      Return in about 8 weeks (around 03/23/2024) for vit d recheck with lab.   Marikay Alar, MD [facility]

## 2024-01-27 NOTE — Assessment & Plan Note (Signed)
Per patient's oncologist symptoms were likely related to chemotherapy induced neuropathy.  Patient will continue to see his oncologist and they will continue to determine appropriate management for his cancer.

## 2024-01-27 NOTE — Assessment & Plan Note (Signed)
Chronic issue.  Patient will continue vitamin D 50,000 units weekly.  Recheck in 8 weeks.

## 2024-01-29 ENCOUNTER — Encounter (HOSPITAL_COMMUNITY): Payer: Self-pay | Admitting: Clinical

## 2024-01-29 ENCOUNTER — Other Ambulatory Visit: Payer: Self-pay

## 2024-01-29 ENCOUNTER — Ambulatory Visit (HOSPITAL_COMMUNITY): Payer: BC Managed Care – PPO | Admitting: Clinical

## 2024-01-29 DIAGNOSIS — F411 Generalized anxiety disorder: Secondary | ICD-10-CM

## 2024-01-29 DIAGNOSIS — F330 Major depressive disorder, recurrent, mild: Secondary | ICD-10-CM

## 2024-01-29 NOTE — Progress Notes (Signed)
 THERAPIST PROGRESS NOTE  Session Time:  9:05am-10:00am   Session #12  Participation Level: Active  Behavioral Response: Casual Alert Anxious and Euthymic   Type of Therapy: Individual Therapy  Treatment Goals Addressed:  Goal: LTG: Myers [name] will score less than 5 on the Generalized Anxiety Disorder 7 Scale (GAD-7) Goal: STG: Larone [name] will reduce frequency of avoidant behaviors by 50% as evidenced by self-report in therapy sessions Goal: LTG: Learn a variety of coping skills and demonstrate the ability to use them to decrease feelings of sadness, anger, and fear and increase feelings of happiness, peace, and powerfulness AEB gauging those emotions on 1-10 scale. Goal: LTG: Learn breathing techniques and grounding techniques at an age-appropriate level and demonstrate mastery in session then report independent use of these skills out of session. Goal: LTG: Reduce frequency, intensity, and duration of depression symptoms so that daily functioning is improved Goal: STG: Reduce overall depression score to no more than 9 on the Patient Health Questionnaire (PHQ-9) Goal: LTG: Explore personal core beliefs, rules and assumptions, and cognitive distortions through therapist using Cognitive Behavioral Therapy; learn how to develop replacement thoughts and challenge unhelpful thoughts. Goal: LTG: Learn additional communication techniques that can assist with better navigating relationships. Goal: LTG: Learn the types of boundaries, the necessity, and how to implement boundaries even when it is difficult Goal: LTG: Review PHQ-9 scores to determine and celebrate progress.  ProgressTowards Goals: Progressing  Interventions: Supportive and Other: forgiveness    Summary: Cory Gomez is a 43 y.o. male who presents with MDD/severe, GAD, insomnia, and relationship problems with his wife. He presented oriented x5 and stated he was feeling okay now but I missed the group I was supposed to go to  because I was in the hospital.  CSW evaluated patient's medication compliance, use of coping tools, and self-care, as applicable.   He shared that he had a reaction of some undetermined type during his last chemo treatment when the rate of administering the dosage was increased.  He does not know exactly what it is called, but they suspected a stroke so had to do a work-up for that.  It turns out it was not a stroke, but the symptoms he had mimicked this, so he referred to it as a mini-stroke.  His next chemo treatment is scheduled for tomorrow and he is somewhat nervous since they do not really know what happened previously.  The doctor has told him, however, that they will administer the same dose albeit at a slower rate with additional medicines to help with potential side effects.  The PA and other staff will be more actively monitoring him as well.  As we discussed what he does during treatment in order to be able to manage them, he shared that distractions are very helpful.    He shared also that his relationship with wife is much improved since she has started going to therapy herself.  The ways in which therapy has helped him were reviewed and he shared that he thinks this encouraged his wife to also go for her own help.  He also talked more about his cancer and shared that he no longer blames God for giving him cancer.  He shared that as we have discussed forgiveness, he has decided to forgive a lot of people and this has lifted a burden from him.  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  He has been trying to meditate, [place]  This helps during chemo.  We worked on developing details to his happy place using 5 senses to make it that much more real and accessible to him.  Much of the session was spent in processing his reaction to having cancer, having  had what could have been a close call at his last chemo treatment, and his fears about upcoming chemo tomorrow.  Suicidal/Homicidal: No without intent/plan  Therapist Response: Patient is progressing AEB engaging in scheduled therapy session.  Throughout the session, CSW gave patient the opportunity to explore thoughts and feelings associated with current life situations and past/present stressors.   CSW challenged patient gently and appropriately to consider different ways of looking at reported issues. CSW encouraged patient's expression of feelings and validated these using empathy, active listening, open body language, and unconditional positive regard.        Recommendations:  Return to therapy as scheduled, continue to engage in self care behaviors, continue to journal about his feelings, continue to think about self-forgiveness in relation to the argument he had with father before father's death 17 years ago, PHQ-9 and GAD-7 needed next session   Plan: Return again in 2 weeks on 2/19  Diagnosis:  Encounter Diagnoses  Name Primary?   Major depressive disorder, recurrent episode, mild (HCC) Yes   GAD (generalized anxiety disorder)     Collaboration of Care: Psychiatrist AEB - psychiatrist and therapist both have access to notes in Epic  Patient/Guardian was advised Release of Information must be obtained prior to any record release in order to collaborate their care with an outside provider. Patient/Guardian was advised if they have not already done so to contact the registration department to sign all necessary forms in order for us  to release information regarding their care.   Consent: Patient/Guardian gives verbal consent for treatment and assignment of benefits for services provided during this visit. Patient/Guardian expressed understanding and agreed to proceed.   [name], LCSW 01/29/2024

## 2024-01-29 NOTE — Assessment & Plan Note (Signed)
-  pT4aN1M0 stage IIIB, MMS, G2 -Presented with perforated diverticulitis in sigmoid colon in July, 2024. -Patient underwent laparoscopic resection of sigmoid colon, path reviewed moderate differentiated adenocarcinoma with 1 positive lymph nodes. -I recommend adjuvant chemotherapy FOLFOX for 6 months if he can tolerate.He started on 10/23/2023

## 2024-01-30 ENCOUNTER — Inpatient Hospital Stay (HOSPITAL_BASED_OUTPATIENT_CLINIC_OR_DEPARTMENT_OTHER): Payer: BC Managed Care – PPO | Admitting: Hematology

## 2024-01-30 ENCOUNTER — Inpatient Hospital Stay: Payer: BC Managed Care – PPO | Attending: Nurse Practitioner

## 2024-01-30 ENCOUNTER — Encounter: Payer: Self-pay | Admitting: Hematology

## 2024-01-30 ENCOUNTER — Inpatient Hospital Stay: Payer: BC Managed Care – PPO

## 2024-01-30 VITALS — BP 142/98 | HR 90 | Temp 97.8°F | Resp 18 | Wt 296.1 lb

## 2024-01-30 DIAGNOSIS — E559 Vitamin D deficiency, unspecified: Secondary | ICD-10-CM | POA: Insufficient documentation

## 2024-01-30 DIAGNOSIS — Z5111 Encounter for antineoplastic chemotherapy: Secondary | ICD-10-CM | POA: Diagnosis not present

## 2024-01-30 DIAGNOSIS — Z95828 Presence of other vascular implants and grafts: Secondary | ICD-10-CM

## 2024-01-30 DIAGNOSIS — R5383 Other fatigue: Secondary | ICD-10-CM | POA: Diagnosis not present

## 2024-01-30 DIAGNOSIS — Z452 Encounter for adjustment and management of vascular access device: Secondary | ICD-10-CM | POA: Diagnosis not present

## 2024-01-30 DIAGNOSIS — C187 Malignant neoplasm of sigmoid colon: Secondary | ICD-10-CM | POA: Insufficient documentation

## 2024-01-30 LAB — CBC WITH DIFFERENTIAL (CANCER CENTER ONLY)
Abs Immature Granulocytes: 0.01 10*3/uL (ref 0.00–0.07)
Basophils Absolute: 0 10*3/uL (ref 0.0–0.1)
Basophils Relative: 0 %
Eosinophils Absolute: 0.2 10*3/uL (ref 0.0–0.5)
Eosinophils Relative: 4 %
HCT: 36.8 % — ABNORMAL LOW (ref 39.0–52.0)
Hemoglobin: 12.1 g/dL — ABNORMAL LOW (ref 13.0–17.0)
Immature Granulocytes: 0 %
Lymphocytes Relative: 29 %
Lymphs Abs: 1.4 10*3/uL (ref 0.7–4.0)
MCH: 27.1 pg (ref 26.0–34.0)
MCHC: 32.9 g/dL (ref 30.0–36.0)
MCV: 82.3 fL (ref 80.0–100.0)
Monocytes Absolute: 0.6 10*3/uL (ref 0.1–1.0)
Monocytes Relative: 12 %
Neutro Abs: 2.5 10*3/uL (ref 1.7–7.7)
Neutrophils Relative %: 55 %
Platelet Count: 324 10*3/uL (ref 150–400)
RBC: 4.47 MIL/uL (ref 4.22–5.81)
RDW: 17.4 % — ABNORMAL HIGH (ref 11.5–15.5)
WBC Count: 4.6 10*3/uL (ref 4.0–10.5)
nRBC: 0 % (ref 0.0–0.2)

## 2024-01-30 LAB — CMP (CANCER CENTER ONLY)
ALT: 28 U/L (ref 0–44)
AST: 25 U/L (ref 15–41)
Albumin: 4.3 g/dL (ref 3.5–5.0)
Alkaline Phosphatase: 52 U/L (ref 38–126)
Anion gap: 4 — ABNORMAL LOW (ref 5–15)
BUN: 11 mg/dL (ref 6–20)
CO2: 29 mmol/L (ref 22–32)
Calcium: 9.3 mg/dL (ref 8.9–10.3)
Chloride: 105 mmol/L (ref 98–111)
Creatinine: 0.81 mg/dL (ref 0.61–1.24)
GFR, Estimated: >60 mL/min (ref >60)
Glucose, Bld: 97 mg/dL (ref 70–99)
Potassium: 3.9 mmol/L (ref 3.5–5.1)
Sodium: 138 mmol/L (ref 135–145)
Total Bilirubin: 0.4 mg/dL (ref 0.0–1.2)
Total Protein: 7.5 g/dL (ref 6.5–8.1)

## 2024-01-30 MED ORDER — HEPARIN SOD (PORK) LOCK FLUSH 100 UNIT/ML IV SOLN: 500.0000 [IU] | Freq: Once | INTRAVENOUS | Status: DC | PRN

## 2024-01-30 MED ORDER — FLUOROURACIL CHEMO INJECTION 2.5 GM/50ML
400.0000 mg/m2 | Freq: Once | INTRAVENOUS | Status: AC
  Administered 2024-01-30: 1000 mg via INTRAVENOUS
  Filled 2024-01-30: qty 20

## 2024-01-30 MED ORDER — SODIUM CHLORIDE 0.9 % IV SOLN
2400.0000 mg/m2 | INTRAVENOUS | Status: DC
  Administered 2024-01-30: 6100 mg via INTRAVENOUS
  Filled 2024-01-30: qty 122

## 2024-01-30 MED ORDER — DEXTROSE 5 % IV SOLN
400.0000 mg/m2 | Freq: Once | INTRAVENOUS | Status: AC
  Administered 2024-01-30: 1020 mg via INTRAVENOUS
  Filled 2024-01-30: qty 51

## 2024-01-30 MED ORDER — LORATADINE 10 MG PO TABS
10.0000 mg | ORAL_TABLET | Freq: Once | ORAL | Status: AC
  Administered 2024-01-30: 10 mg via ORAL
  Filled 2024-01-30: qty 1

## 2024-01-30 MED ORDER — PALONOSETRON HCL INJECTION 0.25 MG/5ML
0.2500 mg | Freq: Once | INTRAVENOUS | Status: AC
  Administered 2024-01-30: 0.25 mg via INTRAVENOUS
  Filled 2024-01-30: qty 5

## 2024-01-30 MED ORDER — SODIUM CHLORIDE 0.9% FLUSH
10.0000 mL | INTRAVENOUS | Status: DC | PRN
  Administered 2024-01-30: 10 mL

## 2024-01-30 MED ORDER — SODIUM CHLORIDE 0.9% FLUSH: 10.0000 mL | INTRAVENOUS | Status: DC | PRN

## 2024-01-30 MED ORDER — DEXTROSE 5 % IV SOLN: INTRAVENOUS | Status: DC

## 2024-01-30 MED ORDER — DEXAMETHASONE SODIUM PHOSPHATE 10 MG/ML IJ SOLN
10.0000 mg | Freq: Once | INTRAMUSCULAR | Status: AC
  Administered 2024-01-30: 10 mg via INTRAVENOUS
  Filled 2024-01-30: qty 1

## 2024-01-30 MED ORDER — FAMOTIDINE IN NACL 20-0.9 MG/50ML-% IV SOLN
20.0000 mg | Freq: Once | INTRAVENOUS | Status: AC
  Administered 2024-01-30: 20 mg via INTRAVENOUS
  Filled 2024-01-30: qty 50

## 2024-01-30 MED ORDER — OXALIPLATIN CHEMO INJECTION 100 MG/20ML
85.0000 mg/m2 | Freq: Once | INTRAVENOUS | Status: AC
  Administered 2024-01-30: 200 mg via INTRAVENOUS
  Filled 2024-01-30: qty 40

## 2024-01-30 NOTE — Progress Notes (Signed)
 [facility]   [phone] [phone]   Clinic Follow up Note   Gomez Care Team: Hope Merle, MD as PCP - General (Family Medicine) Sheldon Standing, MD as Consulting Physician (General Surgery) Abran Norleen SAILOR, MD as Consulting Physician (Gastroenterology) Lanny Callander, MD as Consulting Physician (Medical Oncology) Margaret Eduard SAUNDERS, MD as Consulting Physician (Neurology) Llewellyn Gerard LABOR, DO as Consulting Physician (Otolaryngology)  Date of Service:  01/30/2024  CHIEF COMPLAINT: f/u of colon cancer   CURRENT THERAPY:  FOLFOX   Oncology History   Cancer of sigmoid colon - pT4a, pN1a - s/p roboric LAR resection 09/27/2023 -pT4aN1M0 stage IIIB, MMS, G2 -Presented with perforated diverticulitis in sigmoid colon in July, 2024. -Gomez underwent laparoscopic resection of sigmoid colon, path reviewed moderate differentiated adenocarcinoma with 1 positive lymph nodes. -I recommend adjuvant chemotherapy FOLFOX for 6 months if he can tolerate.He started on 10/23/2023    Assessment and Plan    Colon Cancer A 43 year old male on cycle six of oxaliplatin  chemotherapy for colon cancer experienced a severe reaction during the last infusion, including stroke-like symptoms likely due to the medication. Blood counts, kidney, and liver functions are normal, allowing continuation of treatment. Discussed risks of oxaliplatin , including potential neurotoxicity and stroke-like symptoms. If severe reaction recurs, oxaliplatin  will be discontinued, and alternatives such as Fibryga or oral chemotherapy for three more months will be considered. - Administer additional premedication including antihistamines before chemotherapy - Extend oxaliplatin  infusion time to three hours - Monitor closely during infusion - If severe reaction recurs, discontinue oxaliplatin  and consider oral chemotherapy for three more months  Mental Health Support Undergoing therapy for anxiety and  worry related to cancer treatment. Therapy is beneficial. - Continue mental health therapy  Plan -lab reviewed, will proceed chemo today with aditional premeds and 3hr infusion of oxali  - Schedule follow-up appointment in two weeks.         SUMMARY OF ONCOLOGIC HISTORY: Oncology History  Cancer of sigmoid colon - pT4a, pN1a - s/p roboric LAR resection 09/27/2023  09/25/2023 Cancer Staging   Staging form: Colon and Rectum, AJCC 8th Edition - Pathologic stage from 09/25/2023: Stage IIIB (pT4a, pN1a, cM0) - Signed by Hanford Powell BRAVO, NP on 10/09/2023 Total positive nodes: 1 Histologic grading system: 4 grade system Histologic grade (G): G2 Residual tumor (R): R0 - None   09/25/2023 Surgery   Laparoscopic resection of sigmoid colon  Invasive, moderately differentiated adenocarcinoma of sigmoid colon. Invades into werosal surface. Margins are clear. Negative lymphovascular and perineural invasion. Metastasis to 1/45 lymph nodes.    09/25/2023 Pathology Results   FINAL MICROSCOPIC DIAGNOSIS:  A.   COLON, RESECTION: - Invasive moderately differentiated adenocarcinoma, 7.8 cm involving sigmoid colon - Carcinoma invades into the serosal surface - Resection margins are negative for carcinoma - Negative for lymphovascular or perineural invasion - Metastatic carcinoma to one of forty-five lymph nodes (1/45) - See oncology table B.   COLON, DISTAL MARGIN, EXCISION: - Colonic donut within normal limits, negative for carcinoma  ONCOLOGY TABLE:  COLON AND RECTUM, CARCINOMA:  Resection, Including Transanal Disk Excision of Rectal Neoplasms  Procedure: Resection, rectosigmoid colon Tumor Site: Sigmoid colon Tumor Size: 7.8 cm Macroscopic Tumor Perforation: Not identified  Histologic Type: Adenocarcinoma Histologic Grade: G2: Moderately differentiated Multiple Primary Sites: Not applicable Tumor Extension: Carcinoma invades into the serosal surface Lymphovascular Invasion: Not  identified Perineural Invasion: Not identified Treatment Effect: No known presurgical therapy Margins:      Margin Status for Invasive Carcinoma:  All margins negative for invasive carcinoma      Margin Status for Non-Invasive Tumor: All margins negative for high-grade dysplasia / intramucosal           carcinoma and low-grade dysplasia Regional Lymph Nodes:      Number of Lymph Nodes with Tumor: 1      Number of Lymph Nodes Examined: 45 Tumor Deposits: Not identified Distant Metastasis:      Distant Site(s) Involved: Not applicable Pathologic Stage Classification (pTNM, AJCC 8th Edition): pT4a, pN1a Ancillary Studies: MMR / MSI testing will be ordered. Representative Tumor Block: A2 Comments: None (v4.2.0.1)     09/30/2023 Initial Diagnosis   Cancer of sigmoid colon - pT4a, pN1a - s/p roboric LAR resection 09/27/2023   10/08/2023 Imaging   CT Chest with contrast  IMPRESSION: Negative. No evidence of thoracic metastatic disease or other significant abnormality.   10/23/2023 -  Chemotherapy   Gomez is on Treatment Plan : COLORECTAL FOLFOX q14d x 6 months      Genetic Testing   [facility] CancerNext-Expanded Panel+RNA was Negative. Report date is 11/15/2023.   The CancerNext-Expanded gene panel offered by [facility] and includes sequencing, rearrangement, and RNA analysis for the following 71 genes: AIP, ALK, APC, ATM, AXIN2, BAP1, BARD1, BMPR1A, BRCA1, BRCA2, BRIP1, CDC73, CDH1, CDK4, CDKN1B, CDKN2A, CHEK2, CTNNA1, DICER1, FH, FLCN, KIF1B, LZTR1, MAX, MEN1, MET, MLH1, MSH2, MSH3, MSH6, MUTYH, NF1, NF2, NTHL1, PALB2, PHOX2B, PMS2, POT1, PRKAR1A, PTCH1, PTEN, RAD51C, RAD51D, RB1, RET, SDHA, SDHAF2, SDHB, SDHC, SDHD, SMAD4, SMARCA4, SMARCB1, SMARCE1, STK11, SUFU, TMEM127, TP53, TSC1, TSC2, and VHL (sequencing and deletion/duplication); EGFR, EGLN1, HOXB13, KIT, MITF, PDGFRA, POLD1, and POLE (sequencing only); EPCAM and GREM1 (deletion/duplication only).        Discussed the use  of AI scribe software for clinical note transcription with the Gomez, who gave verbal consent to proceed.  History of Present Illness   A 43 year old Gomez with a history of colon cancer presents for a follow-up visit. The Gomez is currently undergoing chemotherapy and is halfway through the treatment cycle. Recently, the Gomez experienced a severe reaction during an oxaliplatin  infusion, which manifested with stroke-like symptoms. However, all subsequent tests were normal. The Gomez also has a history of severe sinusitis that led to a brain infection, causing dizziness and necessitating extensive rehabilitation. The Gomez reports no current numbness or tingling in the hands or feet. The Gomez is also receiving mental health therapy for anxiety and worry related to his medical condition and treatment.         All other systems were reviewed with the Gomez and are negative.  MEDICAL HISTORY:  Past Medical History:  Diagnosis Date   ADHD (attention deficit hyperactivity disorder)    Annual physical exam 09/09/2019   Anxiety and depression    Cancer (HCC)    Colon cancer (HCC)    COVID-19    12/28/19   Depression, recurrent (HCC) 09/09/2019   Headache 12/31/2021   History of kidney stones    Hypertriglyceridemia 07/17/2021   Insomnia    Inverted nipple 09/16/2019   Kidney stone    Mastoiditis of both sides    Nystagmus 01/22/2022   Obesity (BMI 30-39.9)    Obesity (BMI 30-39.9)    Suicidal ideation 07/14/2021   Vertigo     SURGICAL HISTORY: Past Surgical History:  Procedure Laterality Date   COLON RESECTION  09/25/2023   PORTACATH PLACEMENT Right 10/17/2023   Procedure: INSERTION PORT-A-CATH WITH MATILDA GUIDANCE;  Surgeon: Sheldon Standing, MD;  Location: [facility];  Service: General;  Laterality: Right;   PROCTOSCOPY N/A 09/25/2023   Procedure: RIGID PROCTOSCOPY;  Surgeon: Sheldon Standing, MD;  Location: [facility];  Service: General;  Laterality: N/A;     I have reviewed the social history and family history with the Gomez and they are unchanged from previous note.  ALLERGIES:  is allergic to ambien  [zolpidem ].  MEDICATIONS:  Current Outpatient Medications  Medication Sig Dispense Refill   Acetaminophen  (TYLENOL  PO) Take 500 mg by mouth every 6 (six) hours.     buPROPion  (WELLBUTRIN  XL) 150 MG 24 hr tablet Take 1 tablet (150 mg total) by mouth every morning. 30 tablet 2   DULoxetine  (CYMBALTA ) 60 MG capsule Take 1 capsule (60 mg total) by mouth 2 (two) times daily. 60 capsule 2   eszopiclone  (LUNESTA ) 2 MG TABS tablet Take 1 tablet (2 mg total) by mouth at bedtime as needed for sleep. Take immediately before bedtime 30 tablet 1   lidocaine -prilocaine  (EMLA ) cream Apply to affected area once (Gomez taking differently: Apply 1 Application topically as needed (for chemo port, apply three hours before treatment).) 30 g 3   ondansetron  (ZOFRAN -ODT) 8 MG disintegrating tablet Take 1 tablet (8 mg total) by mouth every 8 (eight) hours as needed for nausea or vomiting. 30 tablet 1   prochlorperazine  (COMPAZINE ) 10 MG tablet Take 1 tablet (10 mg total) by mouth every 6 (six) hours as needed for nausea or vomiting. (Gomez taking differently: Take 10 mg by mouth every 6 (six) hours as needed for nausea or vomiting. Have not started) 30 tablet 1   propranolol  (INDERAL ) 10 MG tablet TAKE 1 TABLET(10 MG) BY MOUTH TWICE DAILY AS NEEDED 60 tablet 2   Vitamin D , Ergocalciferol , (DRISDOL ) 1.25 MG (50000 UNIT) CAPS capsule Take 1 capsule (50,000 Units total) by mouth every 7 (seven) days for 4 doses. 4 capsule 0   No current facility-administered medications for this visit.   Facility-Administered Medications Ordered in Other Visits  Medication Dose Route Frequency Provider Last Rate Last Admin   dextrose  5 % solution   Intravenous Continuous Lanny Callander, MD   Stopped at 01/30/24 1650   fluorouracil  (ADRUCIL ) 6,100 mg in sodium chloride  0.9 % 128 mL chemo  infusion  2,400 mg/m2 (Treatment Plan Recorded) Intravenous 1 day or 1 dose Lanny Callander, MD   6,100 mg at 01/30/24 1648   heparin  lock flush 100 unit/mL  500 Units Intracatheter Once PRN Lanny Callander, MD       sodium chloride  flush (NS) 0.9 % injection 10 mL  10 mL Intracatheter PRN Lanny Callander, MD        PHYSICAL EXAMINATION: ECOG PERFORMANCE STATUS: 0 - Asymptomatic  Vitals:   01/30/24 1125  BP: (!) 142/98  Pulse: 90  Resp: 18  Temp: 97.8 F (36.6 C)  SpO2: 98%   Wt Readings from Last 3 Encounters:  01/30/24 134.3 kg  01/27/24 134.7 kg  01/08/24 133.6 kg     GENERAL:alert, no distress and comfortable SKIN: skin color, texture, turgor are normal, no rashes or significant lesions EYES: normal, Conjunctiva are pink and non-injected, sclera clear NECK: supple, thyroid  normal size, non-tender, without nodularity LYMPH:  no palpable lymphadenopathy in the cervical, axillary  LUNGS: clear to auscultation and percussion with normal breathing effort HEART: regular rate & rhythm and no murmurs and no lower extremity edema ABDOMEN:abdomen soft, non-tender and normal bowel sounds Musculoskeletal:no cyanosis of digits and no clubbing  NEURO: alert & oriented x  3 with fluent speech, no focal motor/sensory deficits  Physical Exam          LABORATORY DATA:  I have reviewed the data as listed    Latest Ref Rng & Units 01/30/2024   10:58 AM 01/09/2024    2:50 AM 01/08/2024    1:40 PM  CBC  WBC 4.0 - 10.5 K/uL 4.6  5.7    Hemoglobin 13.0 - 17.0 g/dL 87.8  89.0  88.3   Hematocrit 39.0 - 52.0 % 36.8  33.6  34.0   Platelets 150 - 400 K/uL 324  299          Latest Ref Rng & Units 01/30/2024   10:58 AM 01/09/2024    2:50 AM 01/08/2024    1:40 PM  CMP  Glucose 70 - 99 mg/dL 97  871  884   BUN 6 - 20 mg/dL 11  13  8    Creatinine 0.61 - 1.24 mg/dL 9.18  9.25  9.29   Sodium 135 - 145 mmol/L 138  133  137   Potassium 3.5 - 5.1 mmol/L 3.9  3.8  4.0   Chloride 98 - 111 mmol/L 105  102  105   CO2  22 - 32 mmol/L 29  19    Calcium  8.9 - 10.3 mg/dL 9.3  9.0    Total Protein 6.5 - 8.1 g/dL 7.5     Total Bilirubin 0.0 - 1.2 mg/dL 0.4     Alkaline Phos 38 - 126 U/L 52     AST 15 - 41 U/L 25     ALT 0 - 44 U/L 28         RADIOGRAPHIC STUDIES: I have personally reviewed the radiological images as listed and agreed with the findings in the report. No results found.    No orders of the defined types were placed in this encounter.  All questions were answered. The Gomez knows to call the clinic with any problems, questions or concerns. No barriers to learning was detected. The total time spent in the appointment was 25 minutes.     Onita Mattock, MD 01/30/2024

## 2024-01-30 NOTE — Patient Instructions (Signed)
 [facility] - A DEPT OF [facility]  Discharge Instructions: Thank you for choosing [facility] to provide your oncology and hematology care.   If you have a lab appointment with the Cancer Center, please go directly to the Cancer Center and check in at the registration area.   Wear comfortable clothing and clothing appropriate for easy access to any Portacath or PICC line.   We strive to give you quality time with your provider. You may need to reschedule your appointment if you arrive late (15 or more minutes).  Arriving late affects you and other patients whose appointments are after yours.  Also, if you miss three or more appointments without notifying the office, you may be dismissed from the clinic at the provider's discretion.      For prescription refill requests, have your pharmacy contact our office and allow 72 hours for refills to be completed.    Today you received the following chemotherapy and/or immunotherapy agents: Oxaliplatin, Leucovorin, Fluorouracil.       To help prevent nausea and vomiting after your treatment, we encourage you to take your nausea medication as directed.  BELOW ARE SYMPTOMS THAT SHOULD BE REPORTED IMMEDIATELY: *FEVER GREATER THAN 100.4 F (38 C) OR HIGHER *CHILLS OR SWEATING *NAUSEA AND VOMITING THAT IS NOT CONTROLLED WITH YOUR NAUSEA MEDICATION *UNUSUAL SHORTNESS OF BREATH *UNUSUAL BRUISING OR BLEEDING *URINARY PROBLEMS (pain or burning when urinating, or frequent urination) *BOWEL PROBLEMS (unusual diarrhea, constipation, pain near the anus) TENDERNESS IN MOUTH AND THROAT WITH OR WITHOUT PRESENCE OF ULCERS (sore throat, sores in mouth, or a toothache) UNUSUAL RASH, SWELLING OR PAIN  UNUSUAL VAGINAL DISCHARGE OR ITCHING   Items with * indicate a potential emergency and should be followed up as soon as possible or go to the Emergency Department if any problems should occur.  Please show the  CHEMOTHERAPY ALERT CARD or IMMUNOTHERAPY ALERT CARD at check-in to the Emergency Department and triage nurse.  Should you have questions after your visit or need to cancel or reschedule your appointment, please contact [facility] - A DEPT OF [facility]  Dept: [phone]  and follow the prompts.  Office hours are 8:00 a.m. to 4:30 p.m. Monday - Friday. Please note that voicemails left after 4:00 p.m. may not be returned until the following business day.  We are closed weekends and major holidays. You have access to a nurse at all times for urgent questions. Please call the main number to the clinic Dept: [phone] and follow the prompts.   For any non-urgent questions, you may also contact your provider using MyChart. We now offer e-Visits for anyone 36 and older to request care online for non-urgent symptoms. For details visit [email or web address].   Also download the MyChart app! Go to the app store, search "MyChart", open the app, select [place], and log in with your MyChart username and password.

## 2024-01-31 ENCOUNTER — Encounter: Payer: Self-pay | Admitting: Hematology

## 2024-02-01 ENCOUNTER — Inpatient Hospital Stay: Payer: BC Managed Care – PPO

## 2024-02-01 VITALS — BP 144/85 | HR 86 | Temp 98.0°F | Resp 18

## 2024-02-01 DIAGNOSIS — C187 Malignant neoplasm of sigmoid colon: Secondary | ICD-10-CM

## 2024-02-01 DIAGNOSIS — Z5111 Encounter for antineoplastic chemotherapy: Secondary | ICD-10-CM | POA: Diagnosis not present

## 2024-02-01 DIAGNOSIS — E559 Vitamin D deficiency, unspecified: Secondary | ICD-10-CM | POA: Diagnosis not present

## 2024-02-01 DIAGNOSIS — R5383 Other fatigue: Secondary | ICD-10-CM | POA: Diagnosis not present

## 2024-02-01 DIAGNOSIS — Z452 Encounter for adjustment and management of vascular access device: Secondary | ICD-10-CM | POA: Diagnosis not present

## 2024-02-01 MED ORDER — HEPARIN SOD (PORK) LOCK FLUSH 100 UNIT/ML IV SOLN
500.0000 [IU] | Freq: Once | INTRAVENOUS | Status: AC | PRN
  Administered 2024-02-01: 500 [IU]

## 2024-02-01 MED ORDER — SODIUM CHLORIDE 0.9% FLUSH
10.0000 mL | INTRAVENOUS | Status: DC | PRN
  Administered 2024-02-01: 10 mL

## 2024-02-03 ENCOUNTER — Encounter: Payer: Self-pay | Admitting: Nurse Practitioner

## 2024-02-05 ENCOUNTER — Ambulatory Visit: Payer: BC Managed Care – PPO

## 2024-02-05 ENCOUNTER — Other Ambulatory Visit: Payer: BC Managed Care – PPO

## 2024-02-05 ENCOUNTER — Ambulatory Visit: Payer: BC Managed Care – PPO | Admitting: Hematology

## 2024-02-12 ENCOUNTER — Encounter: Payer: Self-pay | Admitting: Hematology

## 2024-02-12 ENCOUNTER — Encounter (HOSPITAL_COMMUNITY): Payer: Self-pay | Admitting: Clinical

## 2024-02-12 ENCOUNTER — Ambulatory Visit (INDEPENDENT_AMBULATORY_CARE_PROVIDER_SITE_OTHER): Payer: BC Managed Care – PPO | Admitting: Clinical

## 2024-02-12 DIAGNOSIS — F411 Generalized anxiety disorder: Secondary | ICD-10-CM | POA: Diagnosis not present

## 2024-02-12 DIAGNOSIS — F330 Major depressive disorder, recurrent, mild: Secondary | ICD-10-CM

## 2024-02-12 NOTE — Progress Notes (Unsigned)
 THERAPIST PROGRESS NOTE  Session Time:  1:02pm-1:56pm   Session #13  Virtual Visit via Video Note  I connected with Cory Gomez on 02/12/24 at  1:00 PM EST by a video enabled telemedicine application and verified that I am speaking with the correct person using two identifiers.  Location: Patient: home Provider: home office   I discussed the limitations of evaluation and management by telemedicine and the availability of in person appointments. The patient expressed understanding and agreed to proceed.   I discussed the assessment and treatment plan with the patient. The patient was provided an opportunity to ask questions and all were answered. The patient agreed with the plan and demonstrated an understanding of the instructions.   The patient was advised to call back or seek an in-person evaluation if the symptoms worsen or if the condition fails to improve as anticipated.  I provided 54 minutes of non-face-to-face time during this encounter.   Lynnell Chad, LCSW   Participation Level: Active  Behavioral Response: Casual Alert Euthymic   Type of Therapy: Individual Therapy  Treatment Goals Addressed:  Goal: LTG: Cory "[name]" will score less than 5 on the Generalized Anxiety Disorder 7 Scale (GAD-7) Goal: STG: Cory "[name]" will reduce frequency of avoidant behaviors by 50% as evidenced by self-report in therapy sessions Goal: LTG: Learn a variety of coping skills and demonstrate the ability to use them to decrease feelings of sadness, anger, and fear and increase feelings of happiness, peace, and powerfulness AEB gauging those emotions on 1-10 scale. Goal: LTG: Learn breathing techniques and grounding techniques at an age-appropriate level and demonstrate mastery in session then report independent use of these skills out of session. Goal: LTG: Reduce frequency, intensity, and duration of depression symptoms so that daily functioning is improved Goal: STG: Reduce  overall depression score to no more than 9 on the Patient Health Questionnaire (PHQ-9) Goal: LTG: Explore personal core beliefs, rules and assumptions, and cognitive distortions through therapist using Cognitive Behavioral Therapy; learn how to develop replacement thoughts and challenge unhelpful thoughts. Goal: LTG: Learn additional communication techniques that can assist with better navigating relationships. Goal: LTG: Learn the types of boundaries, the necessity, and how to implement boundaries even when it is difficult Goal: LTG: Review PHQ-9 scores to determine and celebrate progress.  ProgressTowards Goals: Progressing  Interventions: Supportive and Meditation: "what if" meditation    Summary: Cory Gomez is a 43 y.o. male who presents with MDD/severe, GAD, insomnia, and relationship problems with his wife. He presented oriented x5 and stated he was feeling "pretty good."  CSW evaluated patient's medication compliance, use of coping tools, and self-care, as applicable.   He stated there were no issues with his most recent round of chemotherapy so is feeling a bit more relaxed about that.  His sleep is improving, although he still will wake up randomly and not be able to return to sleep.  Sometimes listening to piano music or rain helps.  His wife continues to work and is in therapy, and their relationship is better than previously.  He has been asking himself a lot of of "What if?" Questions, stated it is hard for him to follow up with "What if that doesn't happen?"  As a result, CSW played a short meditation for him about this very question, "What if?"  In the process of breathing, slowing down, and going along with the guided meditation, the questions were asked, "What if I choose to let go, how  could I feel?"  He responded that he could "just be free."  We also processed briefly a few facts about the effects of refined sugar on depression and anxiety, and he revealed that he drinks  significant amounts of sweet tea, was encouraged to consider other options.  Suicidal/Homicidal: No without intent/plan  Therapist Response: Patient is progressing AEB engaging in scheduled therapy session.  Throughout the session, CSW gave patient the opportunity to explore thoughts and feelings associated with current life situations and past/present stressors.   CSW challenged patient gently and appropriately to consider different ways of looking at reported issues. CSW encouraged patient's expression of feelings and validated these using empathy, active listening, open body language, and unconditional positive regard.       Recommendations:  Return to therapy as scheduled, continue to engage in self care behaviors, continue to journal about his feelings, continue to think about self-forgiveness, PHQ-9 and GAD-7 not needed as they were done in doctor's office on 01/27/24 with scores of 12 and 14, respectively  Plan: Return again in 2 weeks on 3/5  Diagnosis:  Encounter Diagnoses  Name Primary?   Major depressive disorder, recurrent episode, mild (HCC) Yes   GAD (generalized anxiety disorder)    Collaboration of Care: Psychiatrist AEB - psychiatrist and therapist both have access to notes in Epic  Patient/Guardian was advised Release of Information must be obtained prior to any record release in order to collaborate their care with an outside provider. Patient/Guardian was advised if they have not already done so to contact the registration department to sign all necessary forms in order for [place] to release information regarding their care.   Consent: Patient/Guardian gives verbal consent for treatment and assignment of benefits for services provided during this visit. Patient/Guardian expressed understanding and agreed to proceed.      01/27/2024    8:57 AM 07/18/2023   11:16 AM 05/29/2023   11:37 AM 03/12/2023   10:53 AM 01/15/2023    1:28 PM  Depression screen PHQ 2/9  Decreased Interest 1 1  1 1 2   Down, Depressed, Hopeless 2 1 1 1 3   PHQ - 2 Score 3 2 2 2 5   Altered sleeping 2 1 2  0 3  Tired, decreased energy 1 1 2 1 3   Change in appetite 2 0 0 1 3  Feeling bad or failure about yourself  2 1 1  0 2  Trouble concentrating 1 1 2 1 3   Moving slowly or fidgety/restless 1 1 1  0 0  Suicidal thoughts 0 0  0 0  PHQ-9 Score 12 7 10 5 19   Difficult doing work/chores  Somewhat difficult Somewhat difficult Somewhat difficult        01/27/2024    8:57 AM 07/18/2023   11:16 AM 05/29/2023   11:38 AM 03/12/2023   10:53 AM  GAD 7 : Generalized Anxiety Score  Nervous, Anxious, on Edge 2 2 1 2   Control/stop worrying 2 2 1 1   Worry too much - different things 2 2 0 1  Trouble relaxing 2 2 1  0  Restless 2 1 1  0  Easily annoyed or irritable 2 2 2 2   Afraid - awful might happen 2 2 0 2  Total GAD 7 Score 14 13 6 8   Anxiety Difficulty  Somewhat difficult Somewhat difficult Somewhat difficult      Lynnell Chad, LCSW 02/12/2024

## 2024-02-13 ENCOUNTER — Other Ambulatory Visit: Payer: BC Managed Care – PPO

## 2024-02-13 ENCOUNTER — Ambulatory Visit: Payer: BC Managed Care – PPO | Admitting: Hematology

## 2024-02-13 ENCOUNTER — Ambulatory Visit: Payer: BC Managed Care – PPO

## 2024-02-17 NOTE — Progress Notes (Unsigned)
 BH MD/PA/NP OP Progress Note  02/20/2024 2:20 PM Cory Gomez  MRN:  [id number]  Visit Diagnosis:    ICD-10-CM   1. Major depressive disorder, recurrent episode, mild (HCC)  F33.0 buPROPion (WELLBUTRIN XL) 150 MG 24 hr tablet    2. GAD (generalized anxiety disorder)  F41.1     3. Insomnia due to mental disorder  F51.05       [name] is a 43 y.o. male with a history of MDD, GAD, ADHD, binge eating disorder, colon cancer stage 3b in treatment, and Vit D deficiency who presented to [facility] for initial evaluation on 01/15/2023.  At initial evaluation patient reported neurovegetative symptoms of depression including low mood, anhedonia, amotivation, worthlessness, insomnia, increased appetite, and occasional passive SI.  He denied any intent or plan.  Patient was able to complete a safety plan and lists his mother as a good support.  Patient furthermore endorsed symptoms of anxiety including feeling nervous or on edge, being unable to stop or control his worrying, difficulty relaxing, increased irritability, restlessness, and fears that something awful might happen.  Psychosocially patient has increased stress from financial struggles, difficulty at work, and interpersonal struggles in his marriage.  Patient meets criteria for MDD and generalized anxiety disorder with further clarity needed to rule out ADHD.  He was referred for neuropsych testing.  Of note due to patient's sleep troubles and body habitus there is some concern for sleep apnea and he was referred to a sleep specialist.  Cory Gomez presents for follow-up evaluation. Today, 02/20/24, patient reports that his anxiety and depression have been stable in addition to insomnia continuing to improve.  There was one transient increase in anxiety symptoms however this was secondary to an adverse reaction to the chemotherapy.  Patient has been able to move past this.   Patient has been working on sleep hygiene with good effect and now only has to use Lunesta a couple times a week.  He continues to take the Cymbalta and Wellbutrin consistently and the propranolol the day of and day prior to his treatment.  He denies any adverse side effects from his medications.  We will continue on his current regimen and follow-up in a month.  Psychotherapeutic interventions were used during today's session. From 9:42 AM to 10:02 AM we used empathic listening techniques and provided support. Used supportive interviewing techniques to validate patients feelings. Improvement was evidenced by patient's participation.  Plan: - Continue Cymbalta to 60 mg BID - Continue Wellbutrin XL 150 mg every day - Conitnue propranolol 10 mg BID prn for anxiety - Continue Lunesta 2 mg QHS as needed for insomnia - Neuropsych referral - Sleep specialist appointment delayed until cancer piece is resolved  - Continue therapy with Cory Gomez twice a month - Going to support groups for people with cancer - Crisis resources reviewed - Follow up in 1 month  Chief Complaint:  Chief Complaint  Patient presents with   Follow-up   HPI: Cory Gomez presents reporting that he is doing pretty good overall. Still overall fatigued which is to be expected with the chemo. He has his 8th treatment yesterday and has 4 more to go. After that they will check to see how he is looking. If no cancer is seen he will move on to 3 month check ins.  Cory Gomez has been working on staying positive about the cancer and things moving forward.  He has been working on  forgiveness. When he first got sick he was blaming god and everything else for his illness, as he was fairly healthy without any notable medical issues. Now however he has been able to start forgiving god for the situation.  Sometimes he can ask why and feel frustrated at which point he works to shift his thoughts to a more positive outlook.  The positives he has  identified are the improved relationship with his wife, the timing of when the cancer was discovered, and the support he has had from his loved ones.  Currently he feels that his anxiety and depression have been stable.  There was one incident where he had an adverse reaction from the treatment where he had some transient increase in anxiety going and leading up to the next treatment.  However with the support of family and staff he was able to work through this and notes minimal anxiety leading up to his treatments now.  Patient does report that he used the propranolol the day before and day of treatment which helps to manage this as well.  She had also mentioned that he has been using the Lunesta less frequently.  He has been working on sleep hygiene and grounding techniques with this therapist and has found that he only needs to use Lunesta a couple nights to fall asleep.  His ultimate goal is to try to get off this medication.  He has been taking the Cymbalta and the Wellbutrin consistently and denies any adverse side effects.  Patient attributes these improvement to the medication, therapy, and faith.  Past Psychiatric History: First episode of depression was in early elementary school after his parents divorced. He was in counseling and prescribed bupropion then. Later, as an adult, he was briefly on fluoxetine with no Gomez benefit. While in school he was diagnosed with having ADHD inattentive type and was on Ritalin. He had seen Dr. Hinton Dyer from 2020-2022. He has no hx of inpatient psychiatric admissions, no hx of mania or overt psychosis. Denies any prior suicide attempts.   Has tried Adderall, Vyvanse, Ritalin, bupropion, Prozac, Zoloft, Cymbalta, trazodone, Zolpidem, topirimate, Lunesta   He does not abuse alcohol or street drugs.  Past Medical History:  Past Medical History:  Diagnosis Date   ADHD (attention deficit hyperactivity disorder)    Annual physical exam 09/09/2019    Anxiety and depression    Cancer (HCC)    Colon cancer (HCC)    COVID-19    12/28/19   Depression, recurrent (HCC) 09/09/2019   Headache 12/31/2021   History of kidney stones    Hypertriglyceridemia 07/17/2021   Insomnia    Inverted nipple 09/16/2019   Kidney stone    Mastoiditis of both sides    Nystagmus 01/22/2022   Obesity (BMI 30-39.9)    Obesity (BMI 30-39.9)    Suicidal ideation 07/14/2021   Vertigo     Past Surgical History:  Procedure Laterality Date   COLON RESECTION  09/25/2023   PORTACATH PLACEMENT Right 10/17/2023   Procedure: INSERTION PORT-A-CATH WITH Gaspar Skeeters GUIDANCE;  Surgeon: Karie Soda, MD;  Location: [facility];  Service: General;  Laterality: Right;   PROCTOSCOPY N/A 09/25/2023   Procedure: RIGID PROCTOSCOPY;  Surgeon: Karie Soda, MD;  Location: [facility];  Service: General;  Laterality: N/A;    Family History:  Family History  Problem Relation Age of Onset   Diabetes Mother    Hypertension Mother    Hyperlipidemia Mother    Diabetes Mellitus II Mother  Anxiety disorder Mother    Depression Mother    Basal cell carcinoma Mother 104 - 13   Stroke Father    Alcoholism Father    Anxiety disorder Brother    Depression Brother    Hypertension Brother    Hyperlipidemia Brother    Diabetes type II Brother    ADD / ADHD Brother    Squamous cell carcinoma Maternal Grandmother    Dementia Maternal Grandfather    Basal cell carcinoma Maternal Grandfather    Prostate cancer Maternal Grandfather 32   Lung cancer Paternal Grandfather 61       smoked   Stomach cancer Neg Hx    Colon cancer Neg Hx    Esophageal cancer Neg Hx    Pancreatic cancer Neg Hx     Social History:  Social History   Socioeconomic History   Marital status: Married    Spouse name: Not on file   Number of children: Not on file   Years of education: Not on file   Highest education level: Not on file  Occupational History   Not on file  Tobacco Use    Smoking status: Never   Smokeless tobacco: Never  Vaping Use   Vaping status: Never Used  Substance and Sexual Activity   Alcohol use: Never   Drug use: Never   Sexual activity: Yes    Partners: Female  Other Topics Concern   Not on file  Social History Narrative   Married no kids wife is Vickey Ewbank    -wife and mother [name] on [place]      Has siblings    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   1 dog    Social Drivers of Corporate investment banker Strain: High Risk (10/11/2023)   Overall Financial Resource Strain (CARDIA)    Difficulty of Paying Living Expenses: Very hard  Food Insecurity: No Food Insecurity (01/08/2024)   Hunger Vital Sign    Worried About Running Out of Food in the Last Year: Never true    Ran Out of Food in the Last Year: Never true  Transportation Needs: No Transportation Needs (01/08/2024)   PRAPARE - Administrator, Civil Service (Medical): No    Lack of Transportation (Non-Medical): No  Physical Activity: Not on file  Stress: Not on file  Social Connections: Moderately Integrated (01/08/2024)   Social Connection and Isolation Panel [NHANES]    Frequency of Communication with Friends and Family: More than three times a week    Frequency of Social Gatherings with Friends and Family: Three times a week    Attends Religious Services: More than 4 times per year    Active Member of Clubs or Organizations: No    Attends Banker Meetings: Never    Marital Status: Married    Allergies:  Allergies  Allergen Reactions   Ambien [Zolpidem] Other (See Comments)    Pt requests we do not give this medication.     Current Medications: Current Outpatient Medications  Medication Sig Dispense Refill   Acetaminophen (TYLENOL PO) Take 500 mg by mouth every 6 (six) hours.     buPROPion (WELLBUTRIN XL) 150 MG 24 hr tablet Take 1 tablet (150 mg total) by mouth every morning. 30 tablet 2   DULoxetine (CYMBALTA) 60 MG capsule Take 1 capsule  (60 mg total) by mouth 2 (two) times daily. 60 capsule 2   eszopiclone (LUNESTA) 2 MG TABS tablet Take 1 tablet (2  mg total) by mouth at bedtime as needed for sleep. Take immediately before bedtime 30 tablet 1   lidocaine-prilocaine (EMLA) cream Apply to affected area once (Patient taking differently: Apply 1 Application topically as needed (for chemo port, apply three hours before treatment).) 30 g 3   ondansetron (ZOFRAN-ODT) 8 MG disintegrating tablet Take 1 tablet (8 mg total) by mouth every 8 (eight) hours as needed for nausea or vomiting. 30 tablet 1   prochlorperazine (COMPAZINE) 10 MG tablet Take 1 tablet (10 mg total) by mouth every 6 (six) hours as needed for nausea or vomiting. (Patient taking differently: Take 10 mg by mouth every 6 (six) hours as needed for nausea or vomiting. Have not started) 30 tablet 1   propranolol (INDERAL) 10 MG tablet TAKE 1 TABLET(10 MG) BY MOUTH TWICE DAILY AS NEEDED 60 tablet 2   No current facility-administered medications for this visit.     Psychiatric Specialty Exam: Review of Systems  There were no vitals taken for this visit.There is no height or weight on file to calculate BMI.  General Appearance: Well Groomed  Eye Contact:  Good  Speech:  Gomez and Coherent and Normal Rate  Volume:  Normal  Mood:  Euthymic  Affect:  Congruent  Thought Process:  Coherent and Goal Directed  Orientation:  Full (Time, Place, and Person)  Thought Content: Logical and Rumination   Suicidal Thoughts:  No  Homicidal Thoughts:  No  Memory:  NA  Judgement:  Fair  Insight:  Fair  Psychomotor Activity:  Normal  Concentration:  Concentration: Good  Recall:  Good  Fund of Knowledge: Fair  Language: Good  Akathisia:  NA    AIMS (if indicated): not done  Assets:  Passenger transport manager Vocational/Educational  ADL's:  Intact  Cognition: WNL  Sleep:  Good   Metabolic Disorder Labs: Lab Results  Component Value Date    HGBA1C 5.8 (H) 01/08/2024   MPG 119.76 01/08/2024   MPG 116.89 09/25/2023   No results found for: "PROLACTIN" Lab Results  Component Value Date   CHOL 158 01/08/2024   TRIG 363 (H) 01/08/2024   HDL 39 (L) 01/08/2024   CHOLHDL 4.1 01/08/2024   VLDL 73 (H) 01/08/2024   LDLCALC 46 01/08/2024   LDLCALC 117 (H) 05/08/2023   Lab Results  Component Value Date   TSH 1.160 01/08/2024   TSH 3.16 05/08/2023    Therapeutic Level Labs: No results found for: "LITHIUM" No results found for: "VALPROATE" No results found for: "CBMZ"   Screenings: GAD-7    Flowsheet Row Office Visit from 01/27/2024 in [facility] Visit from 07/18/2023 in [facility] Visit from 05/29/2023 in [facility] Visit from 03/12/2023 in [facility] Visit from 01/15/2023 in [facility]  Total GAD-7 Score 14 13 6 8 18       PHQ2-9    Flowsheet Row Office Visit from 01/27/2024 in [facility] [facility] Visit from 07/18/2023 in [facility] [place] [facility] [employer] Visit from 05/29/2023 in [facility] [employer] Visit from 03/12/2023 in [facility] [place] [facility] [employer] Visit from 01/15/2023 in [facility]  PHQ-2 Total Score 3 2 2 2 5   PHQ-9 Total Score 12 7 10 5  19  Flowsheet Row ED to Hosp-Admission (Discharged) from 01/08/2024 in [facility] Admission (Discharged) from 10/17/2023 in [facility] Admission (Discharged) from 09/25/2023 in [facility] [place] [facility]  C-SSRS RISK CATEGORY No Risk No Risk No Risk       Collaboration of Care: Collaboration of Care: Medication Management AEB medication  prescription, Other provider involved in patient's care AEB oncology and hospital chart review, and Referral or follow-up with counselor/therapist AEB chart review  Patient/Guardian was advised Release of Information must be obtained prior to any record release in order to collaborate their care with an outside provider. Patient/Guardian was advised if they have not already done so to contact the registration department to sign all necessary forms in order for [place] to release information regarding their care.   Consent: Patient/Guardian gives verbal consent for treatment and assignment of benefits for services provided during this visit. Patient/Guardian expressed understanding and agreed to proceed.    Stasia Cavalier, MD 02/20/2024, 2:20 PM   Virtual Visit via Video Note  I connected with Cory Gomez on 02/20/24 at  9:30 AM EST by a video enabled telemedicine application and verified that I am speaking with the correct person using two identifiers.  Location: Patient: Home Provider: Home Office   I discussed the limitations of evaluation and management by telemedicine and the availability of in person appointments. The patient expressed understanding and agreed to proceed.   I discussed the assessment and treatment plan with the patient. The patient was provided an opportunity to ask questions and all were answered. The patient agreed with the plan and demonstrated an understanding of the instructions.   The patient was advised to call back or seek an in-person evaluation if the symptoms worsen or if the condition fails to improve as anticipated.  I provided 35 minutes of non-face-to-face time during this encounter.   Stasia Cavalier, MD

## 2024-02-19 ENCOUNTER — Inpatient Hospital Stay: Payer: BC Managed Care – PPO

## 2024-02-19 ENCOUNTER — Inpatient Hospital Stay (HOSPITAL_BASED_OUTPATIENT_CLINIC_OR_DEPARTMENT_OTHER): Payer: BC Managed Care – PPO | Admitting: Hematology

## 2024-02-19 ENCOUNTER — Other Ambulatory Visit: Payer: Self-pay

## 2024-02-19 ENCOUNTER — Encounter: Payer: Self-pay | Admitting: Hematology

## 2024-02-19 VITALS — BP 135/94 | HR 88 | Temp 98.1°F | Resp 16 | Wt 298.1 lb

## 2024-02-19 DIAGNOSIS — C187 Malignant neoplasm of sigmoid colon: Secondary | ICD-10-CM

## 2024-02-19 DIAGNOSIS — Z5111 Encounter for antineoplastic chemotherapy: Secondary | ICD-10-CM | POA: Diagnosis not present

## 2024-02-19 DIAGNOSIS — Z95828 Presence of other vascular implants and grafts: Secondary | ICD-10-CM

## 2024-02-19 DIAGNOSIS — E559 Vitamin D deficiency, unspecified: Secondary | ICD-10-CM | POA: Diagnosis not present

## 2024-02-19 DIAGNOSIS — R5383 Other fatigue: Secondary | ICD-10-CM | POA: Diagnosis not present

## 2024-02-19 DIAGNOSIS — Z452 Encounter for adjustment and management of vascular access device: Secondary | ICD-10-CM | POA: Diagnosis not present

## 2024-02-19 LAB — CBC WITH DIFFERENTIAL (CANCER CENTER ONLY)
Abs Immature Granulocytes: 0.02 10*3/uL (ref 0.00–0.07)
Basophils Absolute: 0 10*3/uL (ref 0.0–0.1)
Basophils Relative: 1 %
Eosinophils Absolute: 0.2 10*3/uL (ref 0.0–0.5)
Eosinophils Relative: 6 %
HCT: 37.3 % — ABNORMAL LOW (ref 39.0–52.0)
Hemoglobin: 12.2 g/dL — ABNORMAL LOW (ref 13.0–17.0)
Immature Granulocytes: 1 %
Lymphocytes Relative: 45 %
Lymphs Abs: 1.3 10*3/uL (ref 0.7–4.0)
MCH: 27.4 pg (ref 26.0–34.0)
MCHC: 32.7 g/dL (ref 30.0–36.0)
MCV: 83.6 fL (ref 80.0–100.0)
Monocytes Absolute: 0.4 10*3/uL (ref 0.1–1.0)
Monocytes Relative: 15 %
Neutro Abs: 0.9 10*3/uL — ABNORMAL LOW (ref 1.7–7.7)
Neutrophils Relative %: 32 %
Platelet Count: 240 10*3/uL (ref 150–400)
RBC: 4.46 MIL/uL (ref 4.22–5.81)
RDW: 15.7 % — ABNORMAL HIGH (ref 11.5–15.5)
WBC Count: 2.8 10*3/uL — ABNORMAL LOW (ref 4.0–10.5)
nRBC: 0 % (ref 0.0–0.2)

## 2024-02-19 LAB — CEA (ACCESS): CEA (CHCC): <1 ng/mL (ref 0.00–5.00)

## 2024-02-19 LAB — CMP (CANCER CENTER ONLY)
ALT: 25 U/L (ref 0–44)
AST: 20 U/L (ref 15–41)
Albumin: 4.4 g/dL (ref 3.5–5.0)
Alkaline Phosphatase: 58 U/L (ref 38–126)
Anion gap: 7 (ref 5–15)
BUN: 17 mg/dL (ref 6–20)
CO2: 25 mmol/L (ref 22–32)
Calcium: 9.6 mg/dL (ref 8.9–10.3)
Chloride: 106 mmol/L (ref 98–111)
Creatinine: 0.75 mg/dL (ref 0.61–1.24)
GFR, Estimated: >60 mL/min (ref >60)
Glucose, Bld: 139 mg/dL — ABNORMAL HIGH (ref 70–99)
Potassium: 3.7 mmol/L (ref 3.5–5.1)
Sodium: 138 mmol/L (ref 135–145)
Total Bilirubin: 0.4 mg/dL (ref 0.0–1.2)
Total Protein: 7.4 g/dL (ref 6.5–8.1)

## 2024-02-19 MED ORDER — SODIUM CHLORIDE 0.9 % IV SOLN
2400.0000 mg/m2 | INTRAVENOUS | Status: DC
  Administered 2024-02-19: 6100 mg via INTRAVENOUS
  Filled 2024-02-19: qty 122

## 2024-02-19 MED ORDER — DEXTROSE 5 % IV SOLN: INTRAVENOUS | Status: DC

## 2024-02-19 MED ORDER — SODIUM CHLORIDE 0.9% FLUSH
10.0000 mL | INTRAVENOUS | Status: DC | PRN
  Administered 2024-02-19: 10 mL

## 2024-02-19 MED ORDER — DEXAMETHASONE SODIUM PHOSPHATE 10 MG/ML IJ SOLN
10.0000 mg | Freq: Once | INTRAMUSCULAR | Status: AC
  Administered 2024-02-19: 10 mg via INTRAVENOUS
  Filled 2024-02-19: qty 1

## 2024-02-19 MED ORDER — LORATADINE 10 MG PO TABS
10.0000 mg | ORAL_TABLET | Freq: Once | ORAL | Status: AC
  Administered 2024-02-19: 10 mg via ORAL
  Filled 2024-02-19: qty 1

## 2024-02-19 MED ORDER — FLUOROURACIL CHEMO INJECTION 2.5 GM/50ML
400.0000 mg/m2 | Freq: Once | INTRAVENOUS | Status: AC
  Administered 2024-02-19: 1000 mg via INTRAVENOUS
  Filled 2024-02-19: qty 20

## 2024-02-19 MED ORDER — FAMOTIDINE IN NACL 20-0.9 MG/50ML-% IV SOLN
20.0000 mg | Freq: Once | INTRAVENOUS | Status: AC
  Administered 2024-02-19: 20 mg via INTRAVENOUS
  Filled 2024-02-19: qty 50

## 2024-02-19 MED ORDER — PALONOSETRON HCL INJECTION 0.25 MG/5ML
0.2500 mg | Freq: Once | INTRAVENOUS | Status: AC
  Administered 2024-02-19: 0.25 mg via INTRAVENOUS
  Filled 2024-02-19: qty 5

## 2024-02-19 MED ORDER — DEXTROSE 5 % IV SOLN
400.0000 mg/m2 | Freq: Once | INTRAVENOUS | Status: AC
  Administered 2024-02-19: 1020 mg via INTRAVENOUS
  Filled 2024-02-19: qty 50

## 2024-02-19 MED ORDER — OXALIPLATIN CHEMO INJECTION 100 MG/20ML
85.0000 mg/m2 | Freq: Once | INTRAVENOUS | Status: AC
  Administered 2024-02-19: 200 mg via INTRAVENOUS
  Filled 2024-02-19: qty 30

## 2024-02-19 NOTE — Patient Instructions (Signed)
 [facility] - A DEPT OF [facility]  Discharge Instructions: Thank you for choosing [facility] to provide your oncology and hematology care.   If you have a lab appointment with the Cancer Center, please go directly to the Cancer Center and check in at the registration area.   Wear comfortable clothing and clothing appropriate for easy access to any Portacath or PICC line.   We strive to give you quality time with your provider. You may need to reschedule your appointment if you arrive late (15 or more minutes).  Arriving late affects you and other patients whose appointments are after yours.  Also, if you miss three or more appointments without notifying the office, you may be dismissed from the clinic at the provider's discretion.      For prescription refill requests, have your pharmacy contact our office and allow 72 hours for refills to be completed.    Today you received the following chemotherapy and/or immunotherapy agents oxaliplatin, fluorourcil, leucovorin      To help prevent nausea and vomiting after your treatment, we encourage you to take your nausea medication as directed.  BELOW ARE SYMPTOMS THAT SHOULD BE REPORTED IMMEDIATELY: *FEVER GREATER THAN 100.4 F (38 C) OR HIGHER *CHILLS OR SWEATING *NAUSEA AND VOMITING THAT IS NOT CONTROLLED WITH YOUR NAUSEA MEDICATION *UNUSUAL SHORTNESS OF BREATH *UNUSUAL BRUISING OR BLEEDING *URINARY PROBLEMS (pain or burning when urinating, or frequent urination) *BOWEL PROBLEMS (unusual diarrhea, constipation, pain near the anus) TENDERNESS IN MOUTH AND THROAT WITH OR WITHOUT PRESENCE OF ULCERS (sore throat, sores in mouth, or a toothache) UNUSUAL RASH, SWELLING OR PAIN  UNUSUAL VAGINAL DISCHARGE OR ITCHING   Items with * indicate a potential emergency and should be followed up as soon as possible or go to the Emergency Department if any problems should occur.  Please show the CHEMOTHERAPY  ALERT CARD or IMMUNOTHERAPY ALERT CARD at check-in to the Emergency Department and triage nurse.  Should you have questions after your visit or need to cancel or reschedule your appointment, please contact [facility] - A DEPT OF [facility]  Dept: [phone]  and follow the prompts.  Office hours are 8:00 a.m. to 4:30 p.m. Monday - Friday. Please note that voicemails left after 4:00 p.m. may not be returned until the following business day.  We are closed weekends and major holidays. You have access to a nurse at all times for urgent questions. Please call the main number to the clinic Dept: [phone] and follow the prompts.   For any non-urgent questions, you may also contact your provider using MyChart. We now offer e-Visits for anyone 23 and older to request care online for non-urgent symptoms. For details visit [email or web address].   Also download the MyChart app! Go to the app store, search "MyChart", open the app, select [place], and log in with your MyChart username and password.

## 2024-02-19 NOTE — Assessment & Plan Note (Signed)
-  pT4aN1M0 stage IIIB, MMS, G2 -Presented with perforated diverticulitis in sigmoid colon in July, 2024. -Patient underwent laparoscopic resection of sigmoid colon, path reviewed moderate differentiated adenocarcinoma with 1 positive lymph nodes. -I recommend adjuvant chemotherapy FOLFOX for 6 months if he can tolerate.

## 2024-02-19 NOTE — Progress Notes (Signed)
 [facility]   [phone] Fax:([phone]   Clinic Follow up Note   Patient Care Team: [name], MD as PCP - General (Family Medicine) Karie Soda, MD as Consulting Physician (General Surgery) Hilarie Fredrickson, MD as Consulting Physician (Gastroenterology) Malachy Mood, MD as Consulting Physician (Medical Oncology) Suanne Marker, MD as Consulting Physician (Neurology) Laren Boom, DO as Consulting Physician (Otolaryngology)  Date of Service:  02/19/2024  CHIEF COMPLAINT: f/u of colon cancer  CURRENT THERAPY:  Adjuvant chemotherapy FOLFOX  Oncology History   Cancer of sigmoid colon - pT4a, pN1a - s/p roboric LAR resection 09/27/2023 -pT4aN1M0 stage IIIB, MMS, G2 -Presented with perforated diverticulitis in sigmoid colon in July, 2024. -Patient underwent laparoscopic resection of sigmoid colon, path reviewed moderate differentiated adenocarcinoma with 1 positive lymph nodes. -I recommend adjuvant chemotherapy FOLFOX for 6 months if he can tolerate.   Assessment and Plan    Colon cancer Undergoing adjuvant chemotherapy with no significant adverse reactions. Fatigue is present but expected. Cold sensitivity resolves within three to four days. No numbness, tingling, or significant nausea. Weight gain attributed to steroid use before each treatment. Blood counts are generally fine, with a slightly low white count due to previous chemotherapy. CT scan planned before completing chemotherapy to detect any remaining cancer cells. Blood test for tumor markers to be scheduled between scans to ensure comprehensive treatment. - Continue adjuvant chemotherapy as planned - Order CT scan before completing chemotherapy - Schedule blood test for tumor markers between scans  Chemotherapy-induced fatigue Fatigue is a common side effect of chemotherapy, likely exacerbated by ongoing treatment.  Chemotherapy-induced cold sensitivity Experiences cold  sensitivity for about three to four days post-chemotherapy, a known side effect.  Chemotherapy-induced weight gain Weight gain likely due to steroid use before each chemotherapy session, as chemotherapy can cause weight loss through nausea and vomiting.  Vitamin D deficiency Vitamin D levels were previously low, and high-dose supplementation was prescribed. Levels were checked a month ago; too early to repeat. On a weekly high-dose regimen for two months. Primary care physician ordered vitamin D level, which is not routinely monitored here. - Continue current vitamin D supplementation regimen - Recheck vitamin D levels in three months      Plan -Lab reviewed, adequate for treatment, will proceed cycle 8 chemotherapy today at the same dose -Lab and follow-up in 2 weeks before cycle 9, will order restaging scan on next visit   SUMMARY OF ONCOLOGIC HISTORY: Oncology History  Cancer of sigmoid colon - pT4a, pN1a - s/p roboric LAR resection 09/27/2023  09/25/2023 Cancer Staging   Staging form: Colon and Rectum, AJCC 8th Edition - Pathologic stage from 09/25/2023: Stage IIIB (pT4a, pN1a, cM0) - Signed by Carlean Jews, NP on 10/09/2023 Total positive nodes: 1 Histologic grading system: 4 grade system Histologic grade (G): G2 Residual tumor (R): R0 - None   09/25/2023 Surgery   Laparoscopic resection of sigmoid colon  Invasive, moderately differentiated adenocarcinoma of sigmoid colon. Invades into werosal surface. Margins are clear. Negative lymphovascular and perineural invasion. Metastasis to 1/45 lymph nodes.    09/25/2023 Pathology Results   FINAL MICROSCOPIC DIAGNOSIS:  A.   COLON, RESECTION: - Invasive moderately differentiated adenocarcinoma, 7.8 cm involving sigmoid colon - Carcinoma invades into the serosal surface - Resection margins are negative for carcinoma - Negative for lymphovascular or perineural invasion - Metastatic carcinoma to one of forty-five lymph nodes  (1/45) - See oncology table B.   COLON, DISTAL MARGIN, EXCISION: -  Colonic donut within normal limits, negative for carcinoma  ONCOLOGY TABLE:  COLON AND RECTUM, CARCINOMA:  Resection, Including Transanal Disk Excision of Rectal Neoplasms  Procedure: Resection, rectosigmoid colon Tumor Site: Sigmoid colon Tumor Size: 7.8 cm Macroscopic Tumor Perforation: Not identified  Histologic Type: Adenocarcinoma Histologic Grade: G2: Moderately differentiated Multiple Primary Sites: Not applicable Tumor Extension: Carcinoma invades into the serosal surface Lymphovascular Invasion: Not identified Perineural Invasion: Not identified Treatment Effect: No known presurgical therapy Margins:      Margin Status for Invasive Carcinoma: All margins negative for invasive carcinoma      Margin Status for Non-Invasive Tumor: All margins negative for high-grade dysplasia / intramucosal           carcinoma and low-grade dysplasia Regional Lymph Nodes:      Number of Lymph Nodes with Tumor: 1      Number of Lymph Nodes Examined: 45 Tumor Deposits: Not identified Distant Metastasis:      Distant Site(s) Involved: Not applicable Pathologic Stage Classification (pTNM, AJCC 8th Edition): pT4a, pN1a Ancillary Studies: MMR / MSI testing will be ordered. Representative Tumor Block: A2 Comments: None (v4.2.0.1)     09/30/2023 Initial Diagnosis   Cancer of sigmoid colon - pT4a, pN1a - s/p roboric LAR resection 09/27/2023   10/08/2023 Imaging   CT Chest with contrast  IMPRESSION: Negative. No evidence of thoracic metastatic disease or other significant abnormality.   10/23/2023 -  Chemotherapy   Patient is on Treatment Plan : COLORECTAL FOLFOX q14d x 6 months      Genetic Testing   [facility] CancerNext-Expanded Panel+RNA was Negative. Report date is 11/15/2023.   The CancerNext-Expanded gene panel offered by [facility] and includes sequencing, rearrangement, and RNA analysis for the following 71  genes: AIP, ALK, APC, ATM, AXIN2, BAP1, BARD1, BMPR1A, BRCA1, BRCA2, BRIP1, CDC73, CDH1, CDK4, CDKN1B, CDKN2A, CHEK2, CTNNA1, DICER1, FH, FLCN, KIF1B, LZTR1, MAX, MEN1, MET, MLH1, MSH2, MSH3, MSH6, MUTYH, NF1, NF2, NTHL1, PALB2, PHOX2B, PMS2, POT1, PRKAR1A, PTCH1, PTEN, RAD51C, RAD51D, RB1, RET, SDHA, SDHAF2, SDHB, SDHC, SDHD, SMAD4, SMARCA4, SMARCB1, SMARCE1, STK11, SUFU, TMEM127, TP53, TSC1, TSC2, and VHL (sequencing and deletion/duplication); EGFR, EGLN1, HOXB13, KIT, MITF, PDGFRA, POLD1, and POLE (sequencing only); EPCAM and GREM1 (deletion/duplication only).        Discussed the use of AI scribe software for clinical note transcription with the patient, who gave verbal consent to proceed.  History of Present Illness   The patient, a [age]-year-old with a history of colon cancer, presents for a follow-up visit while on adjuvant chemotherapy. They report feeling tired, which is a common side effect of their treatment. They also experienced a reaction after their first chemotherapy session, which caused them to question their ability to continue with the treatment. However, they have since recovered and are continuing with the treatment. They are also seeing a therapist to help manage their emotional response to the treatment.  The patient experiences cold sensitivity for about three to four days after each chemotherapy session, but this side effect tends to subside. They deny experiencing any numbness or tingling. Nausea and diarrhea are minimal, and they consider themselves fortunate that these side effects do not bother them significantly.  The patient's blood counts are generally good, although their white blood cell count is slightly low, likely due to the previous chemotherapy. They have also been prescribed vitamin D by their primary care physician due to concerns about their levels. They are currently taking this supplement once a week.  The patient is also  concerned about weight gain, which  they attribute to the steroids they receive before each treatment. They are looking forward to the end of their treatment cycle and a meal out to celebrate.         All other systems were reviewed with the patient and are negative.  MEDICAL HISTORY:  Past Medical History:  Diagnosis Date   ADHD (attention deficit hyperactivity disorder)    Annual physical exam 09/09/2019   Anxiety and depression    Cancer (HCC)    Colon cancer (HCC)    COVID-19    12/28/19   Depression, recurrent (HCC) 09/09/2019   Headache 12/31/2021   History of kidney stones    Hypertriglyceridemia 07/17/2021   Insomnia    Inverted nipple 09/16/2019   Kidney stone    Mastoiditis of both sides    Nystagmus 01/22/2022   Obesity (BMI 30-39.9)    Obesity (BMI 30-39.9)    Suicidal ideation 07/14/2021   Vertigo     SURGICAL HISTORY: Past Surgical History:  Procedure Laterality Date   COLON RESECTION  09/25/2023   PORTACATH PLACEMENT Right 10/17/2023   Procedure: INSERTION PORT-A-CATH WITH Gaspar Skeeters GUIDANCE;  Surgeon: Karie Soda, MD;  Location: [facility];  Service: General;  Laterality: Right;   PROCTOSCOPY N/A 09/25/2023   Procedure: RIGID PROCTOSCOPY;  Surgeon: Karie Soda, MD;  Location: [facility];  Service: General;  Laterality: N/A;    I have reviewed the social history and family history with the patient and they are unchanged from previous note.  ALLERGIES:  is allergic to ambien [zolpidem].  MEDICATIONS:  Current Outpatient Medications  Medication Sig Dispense Refill   Acetaminophen (TYLENOL PO) Take 500 mg by mouth every 6 (six) hours.     buPROPion (WELLBUTRIN XL) 150 MG 24 hr tablet Take 1 tablet (150 mg total) by mouth every morning. 30 tablet 2   DULoxetine (CYMBALTA) 60 MG capsule Take 1 capsule (60 mg total) by mouth 2 (two) times daily. 60 capsule 2   eszopiclone (LUNESTA) 2 MG TABS tablet Take 1 tablet (2 mg total) by mouth at bedtime as needed for sleep. Take  immediately before bedtime 30 tablet 1   lidocaine-prilocaine (EMLA) cream Apply to affected area once (Patient taking differently: Apply 1 Application topically as needed (for chemo port, apply three hours before treatment).) 30 g 3   ondansetron (ZOFRAN-ODT) 8 MG disintegrating tablet Take 1 tablet (8 mg total) by mouth every 8 (eight) hours as needed for nausea or vomiting. 30 tablet 1   prochlorperazine (COMPAZINE) 10 MG tablet Take 1 tablet (10 mg total) by mouth every 6 (six) hours as needed for nausea or vomiting. (Patient taking differently: Take 10 mg by mouth every 6 (six) hours as needed for nausea or vomiting. Have not started) 30 tablet 1   propranolol (INDERAL) 10 MG tablet TAKE 1 TABLET(10 MG) BY MOUTH TWICE DAILY AS NEEDED 60 tablet 2   No current facility-administered medications for this visit.   Facility-Administered Medications Ordered in Other Visits  Medication Dose Route Frequency Provider Last Rate Last Admin   dextrose 5 % solution   Intravenous Continuous Malachy Mood, MD   Stopped at 02/19/24 1456   fluorouracil (ADRUCIL) 6,100 mg in sodium chloride 0.9 % 128 mL chemo infusion  2,400 mg/m2 (Treatment Plan Recorded) Intravenous 1 day or 1 dose Malachy Mood, MD   Infusion Verify at 02/19/24 1533    PHYSICAL EXAMINATION: ECOG PERFORMANCE STATUS: 1 - Symptomatic but completely ambulatory  Vitals:  02/19/24 1004  BP: (!) 135/94  Pulse: 88  Resp: 16  Temp: 98.1 F (36.7 C)  SpO2: 99%   Wt Readings from Last 3 Encounters:  02/19/24 298 lb 1.6 oz (135.2 kg)  01/30/24 296 lb 1.6 oz (134.3 kg)  01/27/24 297 lb (134.7 kg)     GENERAL:alert, no distress and comfortable SKIN: skin color, texture, turgor are normal, no rashes or significant lesions EYES: normal, Conjunctiva are pink and non-injected, sclera clear NECK: supple, thyroid normal size, non-tender, without nodularity LYMPH:  no palpable lymphadenopathy in the cervical, axillary  LUNGS: clear to auscultation and  percussion with normal breathing effort HEART: regular rate & rhythm and no murmurs and no lower extremity edema ABDOMEN:abdomen soft, non-tender and normal bowel sounds Musculoskeletal:no cyanosis of digits and no clubbing  NEURO: alert & oriented x 3 with fluent speech, no focal motor/sensory deficits   LABORATORY DATA:  I have reviewed the data as listed    Latest Ref Rng & Units 02/19/2024    9:30 AM 01/30/2024   10:58 AM 01/09/2024    2:50 AM  CBC  WBC 4.0 - 10.5 K/uL 2.8  4.6  5.7   Hemoglobin 13.0 - 17.0 g/dL 08.6  57.8  46.9   Hematocrit 39.0 - 52.0 % 37.3  36.8  33.6   Platelets 150 - 400 K/uL 240  324  299         Latest Ref Rng & Units 02/19/2024    9:30 AM 01/30/2024   10:58 AM 01/09/2024    2:50 AM  CMP  Glucose 70 - 99 mg/dL 629  97  528   BUN 6 - 20 mg/dL 17  11  13    Creatinine 0.61 - 1.24 mg/dL 4.13  2.44  0.10   Sodium 135 - 145 mmol/L 138  138  133   Potassium 3.5 - 5.1 mmol/L 3.7  3.9  3.8   Chloride 98 - 111 mmol/L 106  105  102   CO2 22 - 32 mmol/L 25  29  19    Calcium 8.9 - 10.3 mg/dL 9.6  9.3  9.0   Total Protein 6.5 - 8.1 g/dL 7.4  7.5    Total Bilirubin 0.0 - 1.2 mg/dL 0.4  0.4    Alkaline Phos 38 - 126 U/L 58  52    AST 15 - 41 U/L 20  25    ALT 0 - 44 U/L 25  28        RADIOGRAPHIC STUDIES: I have personally reviewed the radiological images as listed and agreed with the findings in the report. No results found.    No orders of the defined types were placed in this encounter.  All questions were answered. The patient knows to call the clinic with any problems, questions or concerns. No barriers to learning was detected. The total time spent in the appointment was 25 minutes.     Malachy Mood, MD 02/19/2024

## 2024-02-20 ENCOUNTER — Telehealth (HOSPITAL_COMMUNITY): Payer: BC Managed Care – PPO | Admitting: Psychiatry

## 2024-02-20 ENCOUNTER — Encounter (HOSPITAL_COMMUNITY): Payer: Self-pay | Admitting: Psychiatry

## 2024-02-20 DIAGNOSIS — F5105 Insomnia due to other mental disorder: Secondary | ICD-10-CM | POA: Diagnosis not present

## 2024-02-20 DIAGNOSIS — F411 Generalized anxiety disorder: Secondary | ICD-10-CM | POA: Diagnosis not present

## 2024-02-20 DIAGNOSIS — F330 Major depressive disorder, recurrent, mild: Secondary | ICD-10-CM | POA: Diagnosis not present

## 2024-02-20 MED ORDER — BUPROPION HCL ER (XL) 150 MG PO TB24: 150.0000 mg | ORAL_TABLET | ORAL | 2 refills | Status: DC

## 2024-02-21 ENCOUNTER — Inpatient Hospital Stay: Payer: BC Managed Care – PPO

## 2024-02-21 VITALS — BP 156/81 | HR 88 | Temp 98.6°F | Resp 18

## 2024-02-21 DIAGNOSIS — Z5111 Encounter for antineoplastic chemotherapy: Secondary | ICD-10-CM | POA: Diagnosis not present

## 2024-02-21 DIAGNOSIS — Z452 Encounter for adjustment and management of vascular access device: Secondary | ICD-10-CM | POA: Diagnosis not present

## 2024-02-21 DIAGNOSIS — C187 Malignant neoplasm of sigmoid colon: Secondary | ICD-10-CM | POA: Diagnosis not present

## 2024-02-21 DIAGNOSIS — Z95828 Presence of other vascular implants and grafts: Secondary | ICD-10-CM

## 2024-02-21 DIAGNOSIS — R5383 Other fatigue: Secondary | ICD-10-CM | POA: Diagnosis not present

## 2024-02-21 DIAGNOSIS — E559 Vitamin D deficiency, unspecified: Secondary | ICD-10-CM | POA: Diagnosis not present

## 2024-02-21 MED ORDER — HEPARIN SOD (PORK) LOCK FLUSH 100 UNIT/ML IV SOLN
500.0000 [IU] | Freq: Once | INTRAVENOUS | Status: AC | PRN
  Administered 2024-02-21: 500 [IU]

## 2024-02-21 MED ORDER — SODIUM CHLORIDE 0.9% FLUSH
10.0000 mL | INTRAVENOUS | Status: DC | PRN
  Administered 2024-02-21: 10 mL

## 2024-02-26 ENCOUNTER — Ambulatory Visit (INDEPENDENT_AMBULATORY_CARE_PROVIDER_SITE_OTHER): Payer: BC Managed Care – PPO | Admitting: Clinical

## 2024-02-26 ENCOUNTER — Encounter (HOSPITAL_COMMUNITY): Payer: Self-pay | Admitting: Clinical

## 2024-02-26 DIAGNOSIS — F330 Major depressive disorder, recurrent, mild: Secondary | ICD-10-CM | POA: Diagnosis not present

## 2024-02-26 DIAGNOSIS — F411 Generalized anxiety disorder: Secondary | ICD-10-CM | POA: Diagnosis not present

## 2024-02-26 NOTE — Progress Notes (Signed)
 THERAPIST PROGRESS NOTE  Session Time:  2:03pm-2:57pm   Session #14  Participation Level: Active  Behavioral Response: Casual Alert Euthymic   Type of Therapy: Individual Therapy  Treatment Goals Addressed:  LTG: Cory "[name]" will score less than 5 on the Generalized Anxiety Disorder 7 Scale (GAD-7) STG: Cory "[name]" will reduce frequency of avoidant behaviors by 50% as evidenced by self-report in therapy sessions LTG: Learn a variety of coping skills and demonstrate the ability to use them to decrease feelings of sadness, anger, and fear and increase feelings of happiness, peace, and powerfulness AEB gauging those emotions on 1-10 scale. LTG: Learn breathing techniques and grounding techniques at an age-appropriate level and demonstrate mastery in session then report independent use of these skills out of session. LTG: Reduce frequency, intensity, and duration of depression symptoms so that daily functioning is improved STG: Reduce overall depression score to no more than 9 on the Patient Health Questionnaire (PHQ-9) LTG: Explore personal core beliefs, rules and assumptions, and cognitive distortions through therapist using Cognitive Behavioral Therapy; learn how to develop replacement thoughts and challenge unhelpful thoughts. LTG: Learn additional communication techniques that can assist with better navigating relationships. LTG: Learn the types of boundaries, the necessity, and how to implement boundaries even when it is difficult LTG: Review PHQ-9 scores to determine and celebrate progress.  ProgressTowards Goals: Progressing  Interventions: Psychosocial Skills: worrying scales and Supportive   Summary: Cory Gomez is a 43 y.o. male who presents with MDD/severe, GAD, insomnia, and relationship problems with his wife. He presented oriented x5 and stated he was feeling "fi e."  CSW evaluated patient's medication compliance, use of coping tools, and self-care, as applicable.   He  reported that during chemotherapy he has gained weight, has gone from 260 pounds to 298 pounds despite eating healthier and exercising as much as he can with the chemo.  He continues to try to eliminate sugar from his diet and is also trying to eat whole grains, asked questions about this which led to a discussion about the differences between processed and whole grains particularly.  We processed what is happening with his sleep currently.  He does not need his medication nightly and is grateful for this, does not wish to be on medicine.  CSW explained that he can use the ABC's (discussed previously) in the form of making a gratitude list.  He liked this idea and stated that he has noticed he sleeps better when he is happy.  He shared that he has 4 chemo treatments left over the next 2 months, then he will continue to recover for another 3 months before his doctor will allow him to return to work.  This means he cannot start looking for another job until then.  CSW and patient discussed the positive impact on his over-thinking and overall mental health that his decision to take time to process his thoughts and determine if he is having cognitive distortions has had.  CSW reminded him how important it is to "think about our thinking."  He wondered why he and his younger brother did not get Autism when their other brother has it, so they "obviously" have the gene for it, questioning whether it would show up later in life.  CSW assured him that this would already have shown up and we processed all of the 4 factors contributing to mental illness including genetics, brain chemistry, life events, and personality.  We discussed how we may be able to impact these, if  at all.  The patient expressed relief that this really demonstrates he does not choose to feel this way, but also empowerment that he can choose to take the actions necessary to NOT feel this way.  CSW facilitated patient's expression of frustration with wife  for quitting another job, although he admitted to himself that he does not know if this was even a choice for her.  He chose instead to be focused on the positive outcome that she has another job already, even though it is for fewer hours at lower pay.  He was able to work through how they will have to change their bill paying without becoming judgmental or harsh.  CSW explained to him an experiment done recently with patients of rating their anxiety, spending 5 minutes focused on worrying, then rating their anxiety again.  He rightly predicted that the anxiety would worsen in this instance.  We explored how this can apply to him.  Suicidal/Homicidal: No without intent/plan  Therapist Response: Patient is progressing AEB engaging in scheduled therapy session.  Throughout the session, CSW gave patient the opportunity to explore thoughts and feelings associated with current life situations and past/present stressors.   CSW challenged patient gently and appropriately to consider different ways of looking at reported issues. CSW encouraged patient's expression of feelings and validated these using empathy, active listening, open body language, and unconditional positive regard.   CSW encouraged patient to schedule more therapy sessions for the future, as needed.     Recommendations:  Return to therapy as scheduled, continue to engage in self care behaviors, continue to work on reducing sugar in diet  Plan: Return again in 2 weeks on 3/19  Diagnosis:  Encounter Diagnoses  Name Primary?   Major depressive disorder, recurrent episode, mild (HCC) Yes   GAD (generalized anxiety disorder)     Collaboration of Care: Psychiatrist AEB - psychiatrist and therapist both have access to notes in Epic  Patient/Guardian was advised Release of Information must be obtained prior to any record release in order to collaborate their care with an outside provider. Patient/Guardian was advised if they have not already done  so to contact the registration department to sign all necessary forms in order for [place] to release information regarding their care.   Consent: Patient/Guardian gives verbal consent for treatment and assignment of benefits for services provided during this visit. Patient/Guardian expressed understanding and agreed to proceed.        Lynnell Chad, LCSW 02/26/2024

## 2024-02-27 ENCOUNTER — Other Ambulatory Visit: Payer: BC Managed Care – PPO

## 2024-02-27 ENCOUNTER — Ambulatory Visit: Payer: BC Managed Care – PPO

## 2024-02-27 ENCOUNTER — Ambulatory Visit: Payer: BC Managed Care – PPO | Admitting: Nurse Practitioner

## 2024-03-03 NOTE — Assessment & Plan Note (Signed)
-  pT4aN1M0 stage IIIB, MMS, G2 -Presented with perforated diverticulitis in sigmoid colon in July, 2024. -Patient underwent laparoscopic resection of sigmoid colon, path reviewed moderate differentiated adenocarcinoma with 1 positive lymph nodes. -I recommend adjuvant chemotherapy FOLFOX for 6 months if he can tolerate.

## 2024-03-04 ENCOUNTER — Inpatient Hospital Stay: Payer: BC Managed Care – PPO | Attending: Nurse Practitioner

## 2024-03-04 ENCOUNTER — Inpatient Hospital Stay: Payer: BC Managed Care – PPO | Attending: Nurse Practitioner | Admitting: Hematology

## 2024-03-04 ENCOUNTER — Other Ambulatory Visit: Payer: Self-pay

## 2024-03-04 ENCOUNTER — Encounter: Payer: Self-pay | Admitting: Hematology

## 2024-03-04 ENCOUNTER — Inpatient Hospital Stay: Payer: BC Managed Care – PPO

## 2024-03-04 VITALS — BP 150/96 | HR 109 | Temp 98.0°F | Resp 21 | Ht 74.0 in | Wt 304.9 lb

## 2024-03-04 VITALS — HR 96

## 2024-03-04 DIAGNOSIS — C187 Malignant neoplasm of sigmoid colon: Secondary | ICD-10-CM

## 2024-03-04 DIAGNOSIS — Z95828 Presence of other vascular implants and grafts: Secondary | ICD-10-CM

## 2024-03-04 DIAGNOSIS — I10 Essential (primary) hypertension: Secondary | ICD-10-CM | POA: Insufficient documentation

## 2024-03-04 DIAGNOSIS — L709 Acne, unspecified: Secondary | ICD-10-CM | POA: Insufficient documentation

## 2024-03-04 DIAGNOSIS — D500 Iron deficiency anemia secondary to blood loss (chronic): Secondary | ICD-10-CM

## 2024-03-04 DIAGNOSIS — Z5111 Encounter for antineoplastic chemotherapy: Secondary | ICD-10-CM | POA: Insufficient documentation

## 2024-03-04 DIAGNOSIS — Z452 Encounter for adjustment and management of vascular access device: Secondary | ICD-10-CM | POA: Diagnosis not present

## 2024-03-04 DIAGNOSIS — G620 Drug-induced polyneuropathy: Secondary | ICD-10-CM | POA: Insufficient documentation

## 2024-03-04 LAB — CMP (CANCER CENTER ONLY)
ALT: 23 U/L (ref 0–44)
AST: 20 U/L (ref 15–41)
Albumin: 4.4 g/dL (ref 3.5–5.0)
Alkaline Phosphatase: 66 U/L (ref 38–126)
Anion gap: 6 (ref 5–15)
BUN: 11 mg/dL (ref 6–20)
CO2: 27 mmol/L (ref 22–32)
Calcium: 9.1 mg/dL (ref 8.9–10.3)
Chloride: 105 mmol/L (ref 98–111)
Creatinine: 0.76 mg/dL (ref 0.61–1.24)
GFR, Estimated: >60 mL/min (ref >60)
Glucose, Bld: 155 mg/dL — ABNORMAL HIGH (ref 70–99)
Potassium: 3.8 mmol/L (ref 3.5–5.1)
Sodium: 138 mmol/L (ref 135–145)
Total Bilirubin: 0.4 mg/dL (ref 0.0–1.2)
Total Protein: 7.1 g/dL (ref 6.5–8.1)

## 2024-03-04 LAB — CBC WITH DIFFERENTIAL (CANCER CENTER ONLY)
Abs Immature Granulocytes: 0.01 10*3/uL (ref 0.00–0.07)
Basophils Absolute: 0 10*3/uL (ref 0.0–0.1)
Basophils Relative: 1 %
Eosinophils Absolute: 0.1 10*3/uL (ref 0.0–0.5)
Eosinophils Relative: 2 %
HCT: 35.2 % — ABNORMAL LOW (ref 39.0–52.0)
Hemoglobin: 12 g/dL — ABNORMAL LOW (ref 13.0–17.0)
Immature Granulocytes: 0 %
Lymphocytes Relative: 33 %
Lymphs Abs: 1.1 10*3/uL (ref 0.7–4.0)
MCH: 27.8 pg (ref 26.0–34.0)
MCHC: 34.1 g/dL (ref 30.0–36.0)
MCV: 81.7 fL (ref 80.0–100.0)
Monocytes Absolute: 0.4 10*3/uL (ref 0.1–1.0)
Monocytes Relative: 12 %
Neutro Abs: 1.6 10*3/uL — ABNORMAL LOW (ref 1.7–7.7)
Neutrophils Relative %: 52 %
Platelet Count: 181 10*3/uL (ref 150–400)
RBC: 4.31 MIL/uL (ref 4.22–5.81)
RDW: 14.8 % (ref 11.5–15.5)
WBC Count: 3.1 10*3/uL — ABNORMAL LOW (ref 4.0–10.5)
nRBC: 0 % (ref 0.0–0.2)

## 2024-03-04 LAB — FERRITIN: Ferritin: 22 ng/mL — ABNORMAL LOW (ref 24–336)

## 2024-03-04 MED ORDER — ONDANSETRON 8 MG PO TBDP: 8.0000 mg | ORAL_TABLET | Freq: Three times a day (TID) | ORAL | 1 refills | Status: DC | PRN

## 2024-03-04 MED ORDER — SODIUM CHLORIDE 0.9% FLUSH
10.0000 mL | INTRAVENOUS | Status: DC | PRN
  Administered 2024-03-04: 10 mL

## 2024-03-04 MED ORDER — DEXAMETHASONE SODIUM PHOSPHATE 10 MG/ML IJ SOLN
10.0000 mg | Freq: Once | INTRAMUSCULAR | Status: AC
  Administered 2024-03-04: 10 mg via INTRAVENOUS
  Filled 2024-03-04: qty 1

## 2024-03-04 MED ORDER — LORATADINE 10 MG PO TABS
10.0000 mg | ORAL_TABLET | Freq: Once | ORAL | Status: AC
  Administered 2024-03-04: 10 mg via ORAL
  Filled 2024-03-04: qty 1

## 2024-03-04 MED ORDER — SODIUM CHLORIDE 0.9 % IV SOLN
2400.0000 mg/m2 | INTRAVENOUS | Status: DC
  Administered 2024-03-04: 6100 mg via INTRAVENOUS
  Filled 2024-03-04: qty 122

## 2024-03-04 MED ORDER — DEXTROSE 5 % IV SOLN
400.0000 mg/m2 | Freq: Once | INTRAVENOUS | Status: AC
  Administered 2024-03-04: 1020 mg via INTRAVENOUS
  Filled 2024-03-04: qty 50

## 2024-03-04 MED ORDER — PALONOSETRON HCL INJECTION 0.25 MG/5ML
0.2500 mg | Freq: Once | INTRAVENOUS | Status: AC
  Administered 2024-03-04: 0.25 mg via INTRAVENOUS
  Filled 2024-03-04: qty 5

## 2024-03-04 MED ORDER — DEXTROSE 5 % IV SOLN
INTRAVENOUS | Status: DC
Start: 2024-03-04 — End: 2024-03-04

## 2024-03-04 MED ORDER — ONDANSETRON HCL 8 MG PO TABS: 8.0000 mg | ORAL_TABLET | Freq: Three times a day (TID) | ORAL | 1 refills | Status: AC | PRN

## 2024-03-04 MED ORDER — FLUOROURACIL CHEMO INJECTION 2.5 GM/50ML
400.0000 mg/m2 | Freq: Once | INTRAVENOUS | Status: AC
  Administered 2024-03-04: 1000 mg via INTRAVENOUS
  Filled 2024-03-04: qty 20

## 2024-03-04 MED ORDER — OXALIPLATIN CHEMO INJECTION 100 MG/20ML
70.0000 mg/m2 | Freq: Once | INTRAVENOUS | Status: AC
  Administered 2024-03-04: 180 mg via INTRAVENOUS
  Filled 2024-03-04: qty 1.37

## 2024-03-04 MED ORDER — FAMOTIDINE IN NACL 20-0.9 MG/50ML-% IV SOLN
20.0000 mg | Freq: Once | INTRAVENOUS | Status: AC
  Administered 2024-03-04: 20 mg via INTRAVENOUS
  Filled 2024-03-04: qty 50

## 2024-03-04 NOTE — Progress Notes (Signed)
 [facility]   [phone] [phone]   Clinic Follow up Note   Patient Care Team: [name], MD as PCP - General (Family Medicine) Karie Soda, MD as Consulting Physician (General Surgery) Hilarie Fredrickson, MD as Consulting Physician (Gastroenterology) Malachy Mood, MD as Consulting Physician (Medical Oncology) Suanne Marker, MD as Consulting Physician (Neurology) Laren Boom, DO as Consulting Physician (Otolaryngology)  Date of Service:  03/04/2024  CHIEF COMPLAINT: f/u of colon cancer  CURRENT THERAPY:  Adjuvant FOLFOX  Oncology History   Cancer of sigmoid colon - pT4a, pN1a - s/p roboric LAR resection 09/27/2023 -pT4aN1M0 stage IIIB, MMS, G2 -Presented with perforated diverticulitis in sigmoid colon in July, 2024. -Patient underwent laparoscopic resection of sigmoid colon, path reviewed moderate differentiated adenocarcinoma with 1 positive lymph nodes. -I recommend adjuvant chemotherapy FOLFOX for 6 months if he can tolerate.    Assessment and Plan    Colon Cancer Currently on cycle nine of twelve of chemotherapy for colon cancer. Mild neuropathy symptoms, including a decrease in vibration sensation, likely due to oxaliplatin. Neuropathy not severe enough to discontinue treatment, but oxaliplatin dose will be reduced to manage symptoms. Reports no numbness, tingling, or issues with dropping objects. Appetite is good. Blood counts are well-managed, with slightly low white count but adequate neutrophil levels. Kidney and liver functions are within acceptable ranges. - Reduce the dose of oxaliplatin to manage neuropathy symptoms. - Continue with scheduled chemotherapy treatments. - Schedule the next two chemotherapy sessions. - Refill Zofran prescription and send to [facility] in [place] on [address].  Chemotherapy-Induced Neuropathy Mild neuropathy symptoms, including a decrease in vibration sensation, likely due to  oxaliplatin. Symptoms not severe enough to discontinue treatment, but oxaliplatin dose will be reduced to manage symptoms. - Reduce the dose of oxaliplatin to manage neuropathy symptoms. - Monitor for numbness, tingling, or dropping objects and report any changes.     Plan -Lab reviewed, adequate for treatment, will proceed cycle 9 chemotherapy today with slightly dose reduction on oxaliplatin due to neuropathy -Follow-up in 2 weeks before cycle 10 chemo    SUMMARY OF ONCOLOGIC HISTORY: Oncology History  Cancer of sigmoid colon - pT4a, pN1a - s/p roboric LAR resection 09/27/2023  09/25/2023 Cancer Staging   Staging form: Colon and Rectum, AJCC 8th Edition - Pathologic stage from 09/25/2023: Stage IIIB (pT4a, pN1a, cM0) - Signed by Carlean Jews, NP on 10/09/2023 Total positive nodes: 1 Histologic grading system: 4 grade system Histologic grade (G): G2 Residual tumor (R): R0 - None   09/25/2023 Surgery   Laparoscopic resection of sigmoid colon  Invasive, moderately differentiated adenocarcinoma of sigmoid colon. Invades into werosal surface. Margins are clear. Negative lymphovascular and perineural invasion. Metastasis to 1/45 lymph nodes.    09/25/2023 Pathology Results   FINAL MICROSCOPIC DIAGNOSIS:  A.   COLON, RESECTION: - Invasive moderately differentiated adenocarcinoma, 7.8 cm involving sigmoid colon - Carcinoma invades into the serosal surface - Resection margins are negative for carcinoma - Negative for lymphovascular or perineural invasion - Metastatic carcinoma to one of forty-five lymph nodes (1/45) - See oncology table B.   COLON, DISTAL MARGIN, EXCISION: - Colonic donut within normal limits, negative for carcinoma  ONCOLOGY TABLE:  COLON AND RECTUM, CARCINOMA:  Resection, Including Transanal Disk Excision of Rectal Neoplasms  Procedure: Resection, rectosigmoid colon Tumor Site: Sigmoid colon Tumor Size: 7.8 cm Macroscopic Tumor Perforation: Not  identified  Histologic Type: Adenocarcinoma Histologic Grade: G2: Moderately differentiated Multiple Primary Sites: Not applicable  Tumor Extension: Carcinoma invades into the serosal surface Lymphovascular Invasion: Not identified Perineural Invasion: Not identified Treatment Effect: No known presurgical therapy Margins:      Margin Status for Invasive Carcinoma: All margins negative for invasive carcinoma      Margin Status for Non-Invasive Tumor: All margins negative for high-grade dysplasia / intramucosal           carcinoma and low-grade dysplasia Regional Lymph Nodes:      Number of Lymph Nodes with Tumor: 1      Number of Lymph Nodes Examined: 45 Tumor Deposits: Not identified Distant Metastasis:      Distant Site(s) Involved: Not applicable Pathologic Stage Classification (pTNM, AJCC 8th Edition): pT4a, pN1a Ancillary Studies: MMR / MSI testing will be ordered. Representative Tumor Block: A2 Comments: None (v4.2.0.1)     09/30/2023 Initial Diagnosis   Cancer of sigmoid colon - pT4a, pN1a - s/p roboric LAR resection 09/27/2023   10/08/2023 Imaging   CT Chest with contrast  IMPRESSION: Negative. No evidence of thoracic metastatic disease or other significant abnormality.   10/23/2023 -  Chemotherapy   Patient is on Treatment Plan : COLORECTAL FOLFOX q14d x 6 months      Genetic Testing   [facility] CancerNext-Expanded Panel+RNA was Negative. Report date is 11/15/2023.   The CancerNext-Expanded gene panel offered by [facility] and includes sequencing, rearrangement, and RNA analysis for the following 71 genes: AIP, ALK, APC, ATM, AXIN2, BAP1, BARD1, BMPR1A, BRCA1, BRCA2, BRIP1, CDC73, CDH1, CDK4, CDKN1B, CDKN2A, CHEK2, CTNNA1, DICER1, FH, FLCN, KIF1B, LZTR1, MAX, MEN1, MET, MLH1, MSH2, MSH3, MSH6, MUTYH, NF1, NF2, NTHL1, PALB2, PHOX2B, PMS2, POT1, PRKAR1A, PTCH1, PTEN, RAD51C, RAD51D, RB1, RET, SDHA, SDHAF2, SDHB, SDHC, SDHD, SMAD4, SMARCA4, SMARCB1, SMARCE1, STK11, SUFU,  TMEM127, TP53, TSC1, TSC2, and VHL (sequencing and deletion/duplication); EGFR, EGLN1, HOXB13, KIT, MITF, PDGFRA, POLD1, and POLE (sequencing only); EPCAM and GREM1 (deletion/duplication only).        Discussed the use of AI scribe software for clinical note transcription with the patient, who gave verbal consent to proceed.  History of Present Illness   The patient, with a known history of colon cancer, is currently undergoing chemotherapy. He reports no problems from the last treatment and hopes that the previous adverse reaction was an isolated incident. He reports transient cold sensitivity and numbness, which usually resolves three days after the chemotherapy pump is removed. The patient also reports a mild decrease in vibration sensation in both hands, which the doctor attributes to the chemotherapy. The patient's appetite is good, and he has gained weight, which is considered positive in this context. The patient also mentions that he has been prescribed Zofran for nausea, which he needs to refill.         All other systems were reviewed with the patient and are negative.  MEDICAL HISTORY:  Past Medical History:  Diagnosis Date   ADHD (attention deficit hyperactivity disorder)    Annual physical exam 09/09/2019   Anxiety and depression    Cancer (HCC)    Colon cancer (HCC)    COVID-19    12/28/19   Depression, recurrent (HCC) 09/09/2019   Headache 12/31/2021   History of kidney stones    Hypertriglyceridemia 07/17/2021   Insomnia    Inverted nipple 09/16/2019   Kidney stone    Mastoiditis of both sides    Nystagmus 01/22/2022   Obesity (BMI 30-39.9)    Obesity (BMI 30-39.9)    Suicidal ideation 07/14/2021   Vertigo  SURGICAL HISTORY: Past Surgical History:  Procedure Laterality Date   COLON RESECTION  09/25/2023   PORTACATH PLACEMENT Right 10/17/2023   Procedure: INSERTION PORT-A-CATH WITH Gaspar Skeeters GUIDANCE;  Surgeon: Karie Soda, MD;  Location: [facility];  Service: General;  Laterality: Right;   PROCTOSCOPY N/A 09/25/2023   Procedure: RIGID PROCTOSCOPY;  Surgeon: Karie Soda, MD;  Location: [facility];  Service: General;  Laterality: N/A;    I have reviewed the social history and family history with the patient and they are unchanged from previous note.  ALLERGIES:  is allergic to ambien [zolpidem].  MEDICATIONS:  Current Outpatient Medications  Medication Sig Dispense Refill   Acetaminophen (TYLENOL PO) Take 500 mg by mouth every 6 (six) hours.     buPROPion (WELLBUTRIN XL) 150 MG 24 hr tablet Take 1 tablet (150 mg total) by mouth every morning. 30 tablet 2   DULoxetine (CYMBALTA) 60 MG capsule Take 1 capsule (60 mg total) by mouth 2 (two) times daily. 60 capsule 2   eszopiclone (LUNESTA) 2 MG TABS tablet Take 1 tablet (2 mg total) by mouth at bedtime as needed for sleep. Take immediately before bedtime 30 tablet 1   lidocaine-prilocaine (EMLA) cream Apply to affected area once (Patient taking differently: Apply 1 Application topically as needed (for chemo port, apply three hours before treatment).) 30 g 3   prochlorperazine (COMPAZINE) 10 MG tablet Take 1 tablet (10 mg total) by mouth every 6 (six) hours as needed for nausea or vomiting. (Patient taking differently: Take 10 mg by mouth every 6 (six) hours as needed for nausea or vomiting. Have not started) 30 tablet 1   propranolol (INDERAL) 10 MG tablet TAKE 1 TABLET(10 MG) BY MOUTH TWICE DAILY AS NEEDED 60 tablet 2   ondansetron (ZOFRAN-ODT) 8 MG disintegrating tablet Take 1 tablet (8 mg total) by mouth every 8 (eight) hours as needed for nausea or vomiting. 30 tablet 1   No current facility-administered medications for this visit.   Facility-Administered Medications Ordered in Other Visits  Medication Dose Route Frequency Provider Last Rate Last Admin   dextrose 5 % solution   Intravenous Continuous Malachy Mood, MD 10 mL/hr at 03/04/24 1115 New Bag at 03/04/24 1115    fluorouracil (ADRUCIL) 6,100 mg in sodium chloride 0.9 % 128 mL chemo infusion  2,400 mg/m2 (Treatment Plan Recorded) Intravenous 1 day or 1 dose Malachy Mood, MD       fluorouracil (ADRUCIL) chemo injection 1,000 mg  400 mg/m2 (Treatment Plan Recorded) Intravenous Once Malachy Mood, MD       leucovorin 1,020 mg in dextrose 5 % 250 mL infusion  400 mg/m2 (Treatment Plan Recorded) Intravenous Once Malachy Mood, MD 100 mL/hr at 03/04/24 1221 1,020 mg at 03/04/24 1221   oxaliplatin (ELOXATIN) 180 mg in dextrose 5 % 500 mL chemo infusion  70 mg/m2 (Treatment Plan Recorded) Intravenous Once Malachy Mood, MD 179 mL/hr at 03/04/24 1225 180 mg at 03/04/24 1225    PHYSICAL EXAMINATION: ECOG PERFORMANCE STATUS: 1 - Symptomatic but completely ambulatory  Vitals:   03/04/24 1019  BP: (!) 150/96  Pulse: (!) 109  Resp: (!) 21  Temp: 98 F (36.7 C)  SpO2: 95%   Wt Readings from Last 3 Encounters:  03/04/24 (!) 304 lb 14.4 oz (138.3 kg)  02/19/24 298 lb 1.6 oz (135.2 kg)  01/30/24 296 lb 1.6 oz (134.3 kg)     GENERAL:alert, no distress and comfortable SKIN: skin color, texture, turgor are normal, no rashes or  significant lesions EYES: normal, Conjunctiva are pink and non-injected, sclera clear NECK: supple, thyroid normal size, non-tender, without nodularity LYMPH:  no palpable lymphadenopathy in the cervical, axillary  LUNGS: clear to auscultation and percussion with normal breathing effort HEART: regular rate & rhythm and no murmurs and no lower extremity edema ABDOMEN:abdomen soft, non-tender and normal bowel sounds Musculoskeletal:no cyanosis of digits and no clubbing  NEURO: alert & oriented x 3 with fluent speech, no focal motor/sensory deficits except mild decrease in vibration sensation bilaterally in hands.      LABORATORY DATA:  I have reviewed the data as listed    Latest Ref Rng & Units 03/04/2024    9:38 AM 02/19/2024    9:30 AM 01/30/2024   10:58 AM  CBC  WBC 4.0 - 10.5 K/uL 3.1  2.8  4.6    Hemoglobin 13.0 - 17.0 g/dL 16.1  09.6  04.5   Hematocrit 39.0 - 52.0 % 35.2  37.3  36.8   Platelets 150 - 400 K/uL 181  240  324         Latest Ref Rng & Units 03/04/2024    9:38 AM 02/19/2024    9:30 AM 01/30/2024   10:58 AM  CMP  Glucose 70 - 99 mg/dL 409  811  97   BUN 6 - 20 mg/dL 11  17  11    Creatinine 0.61 - 1.24 mg/dL 9.14  7.82  9.56   Sodium 135 - 145 mmol/L 138  138  138   Potassium 3.5 - 5.1 mmol/L 3.8  3.7  3.9   Chloride 98 - 111 mmol/L 105  106  105   CO2 22 - 32 mmol/L 27  25  29    Calcium 8.9 - 10.3 mg/dL 9.1  9.6  9.3   Total Protein 6.5 - 8.1 g/dL 7.1  7.4  7.5   Total Bilirubin 0.0 - 1.2 mg/dL 0.4  0.4  0.4   Alkaline Phos 38 - 126 U/L 66  58  52   AST 15 - 41 U/L 20  20  25    ALT 0 - 44 U/L 23  25  28        RADIOGRAPHIC STUDIES: I have personally reviewed the radiological images as listed and agreed with the findings in the report. No results found.    Orders Placed This Encounter  Procedures   CBC with Differential (Cancer Center Only)    Standing Status:   Future    Expected Date:   03/31/2024    Expiration Date:   03/31/2025   CMP (Cancer Center only)    Standing Status:   Future    Expected Date:   03/31/2024    Expiration Date:   03/31/2025   CBC with Differential (Cancer Center Only)    Standing Status:   Future    Expected Date:   04/14/2024    Expiration Date:   04/14/2025   CMP (Cancer Center only)    Standing Status:   Future    Expected Date:   04/14/2024    Expiration Date:   04/14/2025   All questions were answered. The patient knows to call the clinic with any problems, questions or concerns. No barriers to learning was detected. The total time spent in the appointment was 30 minutes.     Malachy Mood, MD 03/04/2024

## 2024-03-04 NOTE — Patient Instructions (Signed)
 [facility] - A DEPT OF [facility]  Discharge Instructions: Thank you for choosing [facility] to provide your oncology and hematology care.   If you have a lab appointment with the Cancer Center, please go directly to the Cancer Center and check in at the registration area.   Wear comfortable clothing and clothing appropriate for easy access to any Portacath or PICC line.   We strive to give you quality time with your provider. You may need to reschedule your appointment if you arrive late (15 or more minutes).  Arriving late affects you and other patients whose appointments are after yours.  Also, if you miss three or more appointments without notifying the office, you may be dismissed from the clinic at the provider's discretion.      For prescription refill requests, have your pharmacy contact our office and allow 72 hours for refills to be completed.    Today you received the following chemotherapy and/or immunotherapy agents:oxilaplatin, leucovorin, fluorouracil       To help prevent nausea and vomiting after your treatment, we encourage you to take your nausea medication as directed.  BELOW ARE SYMPTOMS THAT SHOULD BE REPORTED IMMEDIATELY: *FEVER GREATER THAN 100.4 F (38 C) OR HIGHER *CHILLS OR SWEATING *NAUSEA AND VOMITING THAT IS NOT CONTROLLED WITH YOUR NAUSEA MEDICATION *UNUSUAL SHORTNESS OF BREATH *UNUSUAL BRUISING OR BLEEDING *URINARY PROBLEMS (pain or burning when urinating, or frequent urination) *BOWEL PROBLEMS (unusual diarrhea, constipation, pain near the anus) TENDERNESS IN MOUTH AND THROAT WITH OR WITHOUT PRESENCE OF ULCERS (sore throat, sores in mouth, or a toothache) UNUSUAL RASH, SWELLING OR PAIN  UNUSUAL VAGINAL DISCHARGE OR ITCHING   Items with * indicate a potential emergency and should be followed up as soon as possible or go to the Emergency Department if any problems should occur.  Please show the CHEMOTHERAPY  ALERT CARD or IMMUNOTHERAPY ALERT CARD at check-in to the Emergency Department and triage nurse.  Should you have questions after your visit or need to cancel or reschedule your appointment, please contact [facility] - A DE[facility] OF [facility]  Dept: [phone]  and follow the prompts.  Office hours are 8:00 a.m. to 4:30 p.m. Monday - Friday. Please note that voicemails left after 4:00 p.m. may not be returned until the following business day.  We are closed weekends and major holidays. You have access to a nurse at all times for urgent questions. Please call the main number to the clinic Dept: [phone] and follow the prompts.   For any non-urgent questions, you may also contact your provider using MyChart. We now offer e-Visits for anyone 36 and older to request care online for non-urgent symptoms. For details visit [email or web address].   Also download the MyChart app! Go to the app store, search "MyChart", open the app, select , and log in with your MyChart username and password.

## 2024-03-06 ENCOUNTER — Encounter: Payer: Self-pay | Admitting: Hematology

## 2024-03-06 ENCOUNTER — Inpatient Hospital Stay: Payer: BC Managed Care – PPO

## 2024-03-06 VITALS — BP 149/85 | HR 86 | Temp 98.4°F | Resp 18

## 2024-03-06 DIAGNOSIS — G620 Drug-induced polyneuropathy: Secondary | ICD-10-CM | POA: Diagnosis not present

## 2024-03-06 DIAGNOSIS — L709 Acne, unspecified: Secondary | ICD-10-CM | POA: Diagnosis not present

## 2024-03-06 DIAGNOSIS — I10 Essential (primary) hypertension: Secondary | ICD-10-CM | POA: Diagnosis not present

## 2024-03-06 DIAGNOSIS — C187 Malignant neoplasm of sigmoid colon: Secondary | ICD-10-CM | POA: Diagnosis not present

## 2024-03-06 DIAGNOSIS — Z95828 Presence of other vascular implants and grafts: Secondary | ICD-10-CM

## 2024-03-06 DIAGNOSIS — Z452 Encounter for adjustment and management of vascular access device: Secondary | ICD-10-CM | POA: Diagnosis not present

## 2024-03-06 DIAGNOSIS — Z5111 Encounter for antineoplastic chemotherapy: Secondary | ICD-10-CM | POA: Diagnosis not present

## 2024-03-06 MED ORDER — HEPARIN SOD (PORK) LOCK FLUSH 100 UNIT/ML IV SOLN
500.0000 [IU] | Freq: Once | INTRAVENOUS | Status: AC | PRN
  Administered 2024-03-06: 500 [IU]

## 2024-03-06 MED ORDER — SODIUM CHLORIDE 0.9% FLUSH
10.0000 mL | INTRAVENOUS | Status: DC | PRN
  Administered 2024-03-06: 10 mL

## 2024-03-11 ENCOUNTER — Ambulatory Visit (HOSPITAL_COMMUNITY): Payer: BC Managed Care – PPO | Admitting: Clinical

## 2024-03-12 ENCOUNTER — Ambulatory Visit: Payer: BC Managed Care – PPO

## 2024-03-12 ENCOUNTER — Other Ambulatory Visit: Payer: BC Managed Care – PPO

## 2024-03-12 ENCOUNTER — Ambulatory Visit: Payer: BC Managed Care – PPO | Admitting: Hematology

## 2024-03-16 NOTE — Progress Notes (Unsigned)
 BH MD/PA/NP OP Progress Note  03/19/2024 12:56 PM Ronrico GARLAND HINCAPIE  MRN:  [id number]  Visit Diagnosis:    ICD-10-CM   1. Major depressive disorder, recurrent episode, mild (HCC)  F33.0 DULoxetine (CYMBALTA) 60 MG capsule    2. GAD (generalized anxiety disorder)  F41.1 DULoxetine (CYMBALTA) 60 MG capsule    3. Insomnia due to mental disorder  F51.05        Assessment: Glendel Jaggers is a 43 y.o. male with a history of MDD, GAD, ADHD, binge eating disorder, colon cancer stage 3b in treatment, and Vit D deficiency who presented to [facility] at [facility] for initial evaluation on 01/15/2023.  At initial evaluation patient reported neurovegetative symptoms of depression including low mood, anhedonia, amotivation, worthlessness, insomnia, increased appetite, and occasional passive SI.  He denied any intent or plan.  Patient was able to complete a safety plan and lists his mother as a good support.  Patient furthermore endorsed symptoms of anxiety including feeling nervous or on edge, being unable to stop or control his worrying, difficulty relaxing, increased irritability, restlessness, and fears that something awful might happen.  Psychosocially patient has increased stress from financial struggles, difficulty at work, and interpersonal struggles in his marriage.  Patient meets criteria for MDD and generalized anxiety disorder with further clarity needed to rule out ADHD.  He was referred for neuropsych testing.  Of note due to patient's sleep troubles and body habitus there is some concern for sleep apnea and he was referred to a sleep specialist.  [name] presents for follow-up evaluation. Today, 03/19/24, patient reports slight increase in anxiety in the interim as he is coming up towards the end of his chemotherapy treatment.  Patient has been handling the treatment well and is hopeful that the cancer will be all resolved by the end of this.  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  Medication wise patient continues to take the Cymbalta and Wellbutrin consistently with the propranolol and Lunesta being used primarily on treatment days or the night after treatment.  His ultimate goal is still to discontinue Lunesta in the future.  For now we will continue on his current regimen and follow up in a month. We will have Psychotherapeutic interventions were used during today's session. From 9:47 AM to 10:03 AM we used empathic listening techniques and provided support. Used supportive interviewing techniques to validate patients feelings. Improvement was evidenced by patient's participation.  Plan: - Continue Cymbalta to 60 mg BID - Continue Wellbutrin XL 150 mg every day - Conitnue propranolol 10 mg BID prn for anxiety - Continue Lunesta 2 mg QHS as needed for insomnia - Neuropsych referral - Sleep specialist appointment delayed until cancer piece is resolved  - Continue therapy with Ambrose Mantle twice a month - Going to support groups for people with cancer - Crisis resources reviewed - Follow up in 1 month  Chief Complaint:  Chief Complaint  Patient presents with   Follow-up   HPI: [name] presents reporting that he is doing fairly well today other than dealing with fatigue.  [name] had treatment yesterday, they lowered the dose of the chemo which decreased side effects. Now the treatment lasts 4 hours instead though. He can handle this as there are only 2 more treatments left with a scheduled  end date for 4/23. He is having a CT scan next week to check up on the cancer which she is hopeful for.  Patient does endorse some feelings of boredom coming on over the past few weeks.  The prolonged period of being out of work and limited in his ability to do things/leave the house has gotten  monotonous.  He denies it reaching a point of significant depression.  Patient does endorse some anxiety about returning to work.  Not necessarily in the actual return to work but in his ability to complete his assignments.  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  Support was provided around this and discussed some of the options.  Patient was encouraged to discuss with his oncologist as they are likely familiar with the disability/accommodation recommendations post chemotherapy.  Patient has also started to consider looking for another position in the job which will be less physical for the time that he is recovering postchemotherapy.  Ultimately as patient still has a month ago treatment did encouraged to take things 1 at a time so as to not get overwhelmed.  Medication wise patient continues to take the Wellbutrin and Cymbalta consistently denying adverse side effects.  He uses propranolol on the days of his treatment and Lunesta the night following his treatment.  He denies any adverse side effects from either and notes good effect.  Ultimately he would like to get off of Lunesta though plans to wait until after his chemotherapy treatment is completed.  Past Psychiatric History: First episode of depression was in early elementary school after his parents divorced. He was in counseling and prescribed bupropion then. Later, as an adult, he was briefly on fluoxetine with no clear benefit. While in school he was diagnosed with having ADHD inattentive type and was on Ritalin. He had seen Dr. Hinton Dyer from 2020-2022. He has no hx of inpatient psychiatric admissions, no hx of mania or overt psychosis. Denies any prior suicide attempts.   Has tried Adderall, Vyvanse, Ritalin, bupropion, Prozac, Zoloft, Cymbalta, trazodone, Zolpidem, topirimate, Lunesta   He does not abuse alcohol or street  drugs.  Past Medical History:  Past Medical History:  Diagnosis Date   ADHD (attention deficit hyperactivity disorder)    Annual physical exam 09/09/2019   Anxiety and depression    Cancer (HCC)    Colon cancer (HCC)    COVID-19    12/28/19   Depression, recurrent (HCC) 09/09/2019   Headache 12/31/2021   History of kidney stones    Hypertriglyceridemia 07/17/2021   Insomnia    Inverted nipple 09/16/2019   Kidney stone    Mastoiditis of both sides    Nystagmus 01/22/2022   Obesity (BMI 30-39.9)    Obesity (BMI 30-39.9)    Suicidal ideation 07/14/2021   Vertigo     Past Surgical History:  Procedure Laterality Date   COLON RESECTION  09/25/2023   PORTACATH PLACEMENT Right 10/17/2023   Procedure: INSERTION PORT-A-CATH WITH Gaspar Skeeters GUIDANCE;  Surgeon: Karie Soda, MD;  Location: [facility];  Service: General;  Laterality: Right;   PROCTOSCOPY N/A 09/25/2023   Procedure: RIGID PROCTOSCOPY;  Surgeon: Karie Soda, MD;  Location: [facility];  Service: General;  Laterality: N/A;    Family History:  Family History  Problem Relation Age of Onset   Diabetes Mother    Hypertension Mother    Hyperlipidemia Mother    Diabetes  Mellitus II Mother    Anxiety disorder Mother    Depression Mother    Basal cell carcinoma Mother 69 - 77   Stroke Father    Alcoholism Father    Anxiety disorder Brother    Depression Brother    Hypertension Brother    Hyperlipidemia Brother    Diabetes type II Brother    ADD / ADHD Brother    Squamous cell carcinoma Maternal Grandmother    Dementia Maternal Grandfather    Basal cell carcinoma Maternal Grandfather    Prostate cancer Maternal Grandfather 38   Lung cancer Paternal Grandfather 20       smoked   Stomach cancer Neg Hx    Colon cancer Neg Hx    Esophageal cancer Neg Hx    Pancreatic cancer Neg Hx     Social History:  Social History   Socioeconomic History   Marital status: Married    Spouse name: Not on file    Number of children: Not on file   Years of education: Not on file   Highest education level: Not on file  Occupational History   Not on file  Tobacco Use   Smoking status: Never   Smokeless tobacco: Never  Vaping Use   Vaping status: Never Used  Substance and Sexual Activity   Alcohol use: Never   Drug use: Never   Sexual activity: Yes    Partners: Female  Other Topics Concern   Not on file  Social History Narrative   Married no kids wife is Kaide Gage    -wife and mother [name] on [place]      Has siblings    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   1 dog    Social Drivers of Corporate investment banker Strain: High Risk (10/11/2023)   Overall Financial Resource Strain (CARDIA)    Difficulty of Paying Living Expenses: Very hard  Food Insecurity: No Food Insecurity (01/08/2024)   Hunger Vital Sign    Worried About Running Out of Food in the Last Year: Never true    Ran Out of Food in the Last Year: Never true  Transportation Needs: No Transportation Needs (01/08/2024)   PRAPARE - Administrator, Civil Service (Medical): No    Lack of Transportation (Non-Medical): No  Physical Activity: Not on file  Stress: Not on file  Social Connections: Moderately Integrated (01/08/2024)   Social Connection and Isolation Panel [NHANES]    Frequency of Communication with Friends and Family: More than three times a week    Frequency of Social Gatherings with Friends and Family: Three times a week    Attends Religious Services: More than 4 times per year    Active Member of Clubs or Organizations: No    Attends Banker Meetings: Never    Marital Status: Married    Allergies:  Allergies  Allergen Reactions   Ambien [Zolpidem] Other (See Comments)    Pt requests we do not give this medication.     Current Medications: Current Outpatient Medications  Medication Sig Dispense Refill   Acetaminophen (TYLENOL PO) Take 500 mg by mouth every 6 (six) hours.      buPROPion (WELLBUTRIN XL) 150 MG 24 hr tablet Take 1 tablet (150 mg total) by mouth every morning. 30 tablet 2   DULoxetine (CYMBALTA) 60 MG capsule Take 1 capsule (60 mg total) by mouth 2 (two) times daily. 60 capsule 2   eszopiclone (LUNESTA) 2 MG  TABS tablet Take 1 tablet (2 mg total) by mouth at bedtime as needed for sleep. Take immediately before bedtime 30 tablet 1   lidocaine-prilocaine (EMLA) cream Apply to affected area once (Patient taking differently: Apply 1 Application topically as needed (for chemo port, apply three hours before treatment).) 30 g 3   ondansetron (ZOFRAN) 8 MG tablet Take 1 tablet (8 mg total) by mouth every 8 (eight) hours as needed for nausea or vomiting. 30 tablet 1   prochlorperazine (COMPAZINE) 10 MG tablet Take 1 tablet (10 mg total) by mouth every 6 (six) hours as needed for nausea or vomiting. (Patient taking differently: Take 10 mg by mouth every 6 (six) hours as needed for nausea or vomiting. Have not started) 30 tablet 1   propranolol (INDERAL) 10 MG tablet TAKE 1 TABLET(10 MG) BY MOUTH TWICE DAILY AS NEEDED 60 tablet 2   No current facility-administered medications for this visit.     Psychiatric Specialty Exam: Review of Systems  There were no vitals taken for this visit.There is no height or weight on file to calculate BMI.  General Appearance: Well Groomed  Eye Contact:  Good  Speech:  Clear and Coherent and Normal Rate  Volume:  Normal  Mood:  Euthymic  Affect:  Congruent  Thought Process:  Coherent and Goal Directed  Orientation:  Full (Time, Place, and Person)  Thought Content: Logical and Rumination   Suicidal Thoughts:  No  Homicidal Thoughts:  No  Memory:  NA  Judgement:  Fair  Insight:  Fair  Psychomotor Activity:  Normal  Concentration:  Concentration: Good  Recall:  Good  Fund of Knowledge: Fair  Language: Good  Akathisia:  NA    AIMS (if indicated): not done  Assets:  Aeronautical engineer Vocational/Educational  ADL's:  Intact  Cognition: WNL  Sleep:  Good   Metabolic Disorder Labs: Lab Results  Component Value Date   HGBA1C 5.8 (H) 01/08/2024   MPG 119.76 01/08/2024   MPG 116.89 09/25/2023   No results found for: "PROLACTIN" Lab Results  Component Value Date   CHOL 158 01/08/2024   TRIG 363 (H) 01/08/2024   HDL 39 (L) 01/08/2024   CHOLHDL 4.1 01/08/2024   VLDL 73 (H) 01/08/2024   LDLCALC 46 01/08/2024   LDLCALC 117 (H) 05/08/2023   Lab Results  Component Value Date   TSH 1.160 01/08/2024   TSH 3.16 05/08/2023    Therapeutic Level Labs: No results found for: "LITHIUM" No results found for: "VALPROATE" No results found for: "CBMZ"   Screenings: GAD-7    Flowsheet Row Office Visit from 01/27/2024 in [facility] Visit from 07/18/2023 in [facility] Visit from 05/29/2023 in [facility] Visit from 03/12/2023 in [facility] Visit from 01/15/2023 in [facility]  Total GAD-7 Score 14 13 6 8 18       PHQ2-9    Flowsheet Row Office Visit from 01/27/2024 in [facility] Visit from 07/18/2023 in [facility] Visit from 05/29/2023 in [facility] Visit from 03/12/2023 in [facility] Visit from [facility]  PHQ-2 Total Score 3 2 2 2 5   PHQ-9 Total Score 12 7 10 5  19  Flowsheet Row ED to Hosp-Admission (Discharged) from 01/08/2024 in [facility] Admission (Discharged) from 10/17/2023 in [facility] Admission (Discharged) from  09/25/2023 in [facility] [place] [facility]  C-SSRS RISK CATEGORY No Risk No Risk No Risk       Collaboration of Care: Collaboration of Care: Medication Management AEB medication prescription, Other provider involved in patient's care AEB oncology chart review, and Referral or follow-up with counselor/therapist AEB chart review  Patient/Guardian was advised Release of Information must be obtained prior to any record release in order to collaborate their care with an outside provider. Patient/Guardian was advised if they have not already done so to contact the registration department to sign all necessary forms in order for [place] to release information regarding their care.   Consent: Patient/Guardian gives verbal consent for treatment and assignment of benefits for services provided during this visit. Patient/Guardian expressed understanding and agreed to proceed.    Stasia Cavalier, MD 03/19/2024, 12:56 PM   Virtual Visit via Video Note  I connected with Margaretha Seeds on 03/19/24 at  9:30 AM EDT by a video enabled telemedicine application and verified that I am speaking with the correct person using two identifiers.  Location: Patient: Home Provider: Home Office   I discussed the limitations of evaluation and management by telemedicine and the availability of in person appointments. The patient expressed understanding and agreed to proceed.   I discussed the assessment and treatment plan with the patient. The patient was provided an opportunity to ask questions and all were answered. The patient agreed with the plan and demonstrated an understanding of the instructions.   The patient was advised to call back or seek an in-person evaluation if the symptoms worsen or if the condition fails to improve as anticipated.  I provided 35 minutes of non-face-to-face time during this encounter.   Stasia Cavalier, MD

## 2024-03-17 NOTE — Assessment & Plan Note (Signed)
-  pT4aN1M0 stage IIIB, MMS, G2 -Presented with perforated diverticulitis in sigmoid colon in July, 2024. -Patient underwent laparoscopic resection of sigmoid colon, path reviewed moderate differentiated adenocarcinoma with 1 positive lymph nodes. -I recommend adjuvant chemotherapy FOLFOX for 6 months if he can tolerate.

## 2024-03-18 ENCOUNTER — Inpatient Hospital Stay: Payer: BC Managed Care – PPO

## 2024-03-18 ENCOUNTER — Ambulatory Visit: Payer: BC Managed Care – PPO

## 2024-03-18 ENCOUNTER — Ambulatory Visit: Payer: BC Managed Care – PPO | Admitting: Hematology

## 2024-03-18 ENCOUNTER — Encounter: Payer: Self-pay | Admitting: Hematology

## 2024-03-18 ENCOUNTER — Other Ambulatory Visit: Payer: BC Managed Care – PPO

## 2024-03-18 ENCOUNTER — Inpatient Hospital Stay (HOSPITAL_BASED_OUTPATIENT_CLINIC_OR_DEPARTMENT_OTHER): Payer: BC Managed Care – PPO | Admitting: Hematology

## 2024-03-18 VITALS — BP 160/84 | HR 90 | Temp 98.0°F | Resp 18 | Wt 313.4 lb

## 2024-03-18 DIAGNOSIS — C187 Malignant neoplasm of sigmoid colon: Secondary | ICD-10-CM | POA: Diagnosis not present

## 2024-03-18 DIAGNOSIS — I10 Essential (primary) hypertension: Secondary | ICD-10-CM | POA: Diagnosis not present

## 2024-03-18 DIAGNOSIS — L709 Acne, unspecified: Secondary | ICD-10-CM | POA: Diagnosis not present

## 2024-03-18 DIAGNOSIS — Z5111 Encounter for antineoplastic chemotherapy: Secondary | ICD-10-CM | POA: Diagnosis not present

## 2024-03-18 DIAGNOSIS — G620 Drug-induced polyneuropathy: Secondary | ICD-10-CM | POA: Diagnosis not present

## 2024-03-18 DIAGNOSIS — Z452 Encounter for adjustment and management of vascular access device: Secondary | ICD-10-CM | POA: Diagnosis not present

## 2024-03-18 DIAGNOSIS — Z95828 Presence of other vascular implants and grafts: Secondary | ICD-10-CM

## 2024-03-18 LAB — CBC WITH DIFFERENTIAL (CANCER CENTER ONLY)
Abs Immature Granulocytes: 0 10*3/uL (ref 0.00–0.07)
Basophils Absolute: 0 10*3/uL (ref 0.0–0.1)
Basophils Relative: 1 %
Eosinophils Absolute: 0 10*3/uL (ref 0.0–0.5)
Eosinophils Relative: 1 %
HCT: 35.3 % — ABNORMAL LOW (ref 39.0–52.0)
Hemoglobin: 11.6 g/dL — ABNORMAL LOW (ref 13.0–17.0)
Immature Granulocytes: 0 %
Lymphocytes Relative: 33 %
Lymphs Abs: 0.9 10*3/uL (ref 0.7–4.0)
MCH: 27.8 pg (ref 26.0–34.0)
MCHC: 32.9 g/dL (ref 30.0–36.0)
MCV: 84.4 fL (ref 80.0–100.0)
Monocytes Absolute: 0.4 10*3/uL (ref 0.1–1.0)
Monocytes Relative: 16 %
Neutro Abs: 1.3 10*3/uL — ABNORMAL LOW (ref 1.7–7.7)
Neutrophils Relative %: 49 %
Platelet Count: 156 10*3/uL (ref 150–400)
RBC: 4.18 MIL/uL — ABNORMAL LOW (ref 4.22–5.81)
RDW: 14.9 % (ref 11.5–15.5)
WBC Count: 2.7 10*3/uL — ABNORMAL LOW (ref 4.0–10.5)
nRBC: 0 % (ref 0.0–0.2)

## 2024-03-18 LAB — CMP (CANCER CENTER ONLY)
ALT: 29 U/L (ref 0–44)
AST: 27 U/L (ref 15–41)
Albumin: 4.1 g/dL (ref 3.5–5.0)
Alkaline Phosphatase: 80 U/L (ref 38–126)
Anion gap: 6 (ref 5–15)
BUN: 7 mg/dL (ref 6–20)
CO2: 25 mmol/L (ref 22–32)
Calcium: 9 mg/dL (ref 8.9–10.3)
Chloride: 108 mmol/L (ref 98–111)
Creatinine: 0.74 mg/dL (ref 0.61–1.24)
GFR, Estimated: >60 mL/min (ref >60)
Glucose, Bld: 147 mg/dL — ABNORMAL HIGH (ref 70–99)
Potassium: 4.3 mmol/L (ref 3.5–5.1)
Sodium: 139 mmol/L (ref 135–145)
Total Bilirubin: 0.4 mg/dL (ref 0.0–1.2)
Total Protein: 6.9 g/dL (ref 6.5–8.1)

## 2024-03-18 MED ORDER — DEXAMETHASONE SODIUM PHOSPHATE 10 MG/ML IJ SOLN
10.0000 mg | Freq: Once | INTRAMUSCULAR | Status: AC
  Administered 2024-03-18: 10 mg via INTRAVENOUS
  Filled 2024-03-18: qty 1

## 2024-03-18 MED ORDER — SODIUM CHLORIDE 0.9% FLUSH
10.0000 mL | INTRAVENOUS | Status: DC | PRN
  Administered 2024-03-18: 10 mL

## 2024-03-18 MED ORDER — FAMOTIDINE IN NACL 20-0.9 MG/50ML-% IV SOLN
20.0000 mg | Freq: Once | INTRAVENOUS | Status: AC
  Administered 2024-03-18: 20 mg via INTRAVENOUS
  Filled 2024-03-18: qty 50

## 2024-03-18 MED ORDER — OXALIPLATIN CHEMO INJECTION 100 MG/20ML
60.0000 mg/m2 | Freq: Once | INTRAVENOUS | Status: AC
  Administered 2024-03-18: 150 mg via INTRAVENOUS
  Filled 2024-03-18: qty 10

## 2024-03-18 MED ORDER — LEUCOVORIN CALCIUM INJECTION 350 MG
400.0000 mg/m2 | Freq: Once | INTRAMUSCULAR | Status: AC
  Administered 2024-03-18: 1020 mg via INTRAVENOUS
  Filled 2024-03-18: qty 51

## 2024-03-18 MED ORDER — LORATADINE 10 MG PO TABS
10.0000 mg | ORAL_TABLET | Freq: Once | ORAL | Status: AC
  Administered 2024-03-18: 10 mg via ORAL
  Filled 2024-03-18: qty 1

## 2024-03-18 MED ORDER — FLUOROURACIL CHEMO INJECTION 2.5 GM/50ML
400.0000 mg/m2 | Freq: Once | INTRAVENOUS | Status: AC
Start: 2024-03-18 — End: 2024-03-18
  Administered 2024-03-18: 1000 mg via INTRAVENOUS
  Filled 2024-03-18: qty 20

## 2024-03-18 MED ORDER — DEXTROSE 5 % IV SOLN: INTRAVENOUS | Status: DC

## 2024-03-18 MED ORDER — SODIUM CHLORIDE 0.9 % IV SOLN
2400.0000 mg/m2 | INTRAVENOUS | Status: DC
  Administered 2024-03-18: 6100 mg via INTRAVENOUS
  Filled 2024-03-18: qty 122

## 2024-03-18 MED ORDER — PALONOSETRON HCL INJECTION 0.25 MG/5ML
0.2500 mg | Freq: Once | INTRAVENOUS | Status: AC
  Administered 2024-03-18: 0.25 mg via INTRAVENOUS
  Filled 2024-03-18: qty 5

## 2024-03-18 MED ORDER — SODIUM CHLORIDE 0.9% FLUSH: 10.0000 mL | INTRAVENOUS | Status: DC | PRN

## 2024-03-18 NOTE — Progress Notes (Signed)
 OK treat today with low WBC/ANC per Dr Mosetta Putt who states that oxaliplatin has been reduced.

## 2024-03-18 NOTE — Progress Notes (Signed)
 [facility]   Telephone:([phone] Fax:([phone]) [phone]   Clinic Follow up Note   Gomez Care Team: [name], MD as PCP - General (Family Medicine) Karie Soda, MD as Consulting Physician (General Surgery) Hilarie Fredrickson, MD as Consulting Physician (Gastroenterology) Malachy Mood, MD as Consulting Physician (Medical Oncology) Suanne Marker, MD as Consulting Physician (Neurology) Laren Boom, DO as Consulting Physician (Otolaryngology)  Date of Service:  03/18/2024  CHIEF COMPLAINT: f/u of colon cancer  CURRENT THERAPY:  Adjuvant FOLFOX  Oncology History   Cancer of sigmoid colon - pT4a, pN1a - s/p roboric LAR resection 09/27/2023 -pT4aN1M0 stage IIIB, MMS, G2 -Presented with perforated diverticulitis in sigmoid colon in July, 2024. -Gomez underwent laparoscopic resection of sigmoid colon, path reviewed moderate differentiated adenocarcinoma with 1 positive lymph nodes. -I recommend adjuvant chemotherapy FOLFOX for 6 months if he can tolerate.   Assessment and Plan    Colon cancer Undergoing chemotherapy for colon cancer, currently on cycle ten of twelve. Reports fatigue and prolonged cold intolerance, side effects of oxaliplatin. Neutrophil count is slightly low at 1.3 but adequate for continuing chemotherapy. Previous dose reduction of oxaliplatin helped with side effects; further reduction planned to prevent permanent nerve damage. No numbness or tingling reported. Sensation slightly decreased, and cold sensitivity noted. CT scan needed as no repeat scan since surgery. - Reduce oxaliplatin dose further to prevent nerve damage - Order CT scan to be done in Cory next 2-3 weeks, preferably during Cory week not on chemotherapy - Add premedication to prevent allergic reactions during chemotherapy  Acne Reports worsening acne on face, chest, and back, likely due to chemotherapy. Acne does not itch but is bothersome. Uses Panoxyl wash, which helps  manage Cory condition. - Continue using Panoxyl wash for acne management - Consider topical treatments or antibiotics like doxycycline if acne becomes severe  Hypertension Blood pressure slightly elevated. Advised to have it rechecked during infusion. - Repeat blood pressure measurement during infusion  Long-term disability Currently on long-term disability and plans to continue until recovery post-chemotherapy. Anticipates needing 2-3 months to recover before returning to work. - Assist with any necessary documentation for long-term disability continuation  Plan -Lab reviewed, adequate for treatment, will proceed cycle 10 FOLFOX today with further dose reduction on oxaliplatin due to neuropathy -Follow-up in 2 weeks before cycle 11. -Surveillance CT scan in 2 to 3 weeks     SUMMARY OF ONCOLOGIC HISTORY: Oncology History  Cancer of sigmoid colon - pT4a, pN1a - s/p roboric LAR resection 09/27/2023  09/25/2023 Cancer Staging   Staging form: Colon and Rectum, AJCC 8th Edition - Pathologic stage from 09/25/2023: Stage IIIB (pT4a, pN1a, cM0) - Signed by Carlean Jews, NP on 10/09/2023 Total positive nodes: 1 Histologic grading system: 4 grade system Histologic grade (G): G2 Residual tumor (R): R0 - None   09/25/2023 Surgery   Laparoscopic resection of sigmoid colon  Invasive, moderately differentiated adenocarcinoma of sigmoid colon. Invades into werosal surface. Margins are clear. Negative lymphovascular and perineural invasion. Metastasis to 1/45 lymph nodes.    09/25/2023 Pathology Results   FINAL MICROSCOPIC DIAGNOSIS:  A.   COLON, RESECTION: - Invasive moderately differentiated adenocarcinoma, 7.8 cm involving sigmoid colon - Carcinoma invades into Cory serosal surface - Resection margins are negative for carcinoma - Negative for lymphovascular or perineural invasion - Metastatic carcinoma to one of forty-five lymph nodes (1/45) - See oncology table B.   COLON, DISTAL  MARGIN, EXCISION: - Colonic donut within normal limits, negative  for carcinoma  ONCOLOGY TABLE:  COLON AND RECTUM, CARCINOMA:  Resection, Including Transanal Disk Excision of Rectal Neoplasms  Procedure: Resection, rectosigmoid colon Tumor Site: Sigmoid colon Tumor Size: 7.8 cm Macroscopic Tumor Perforation: Not identified  Histologic Type: Adenocarcinoma Histologic Grade: G2: Moderately differentiated Multiple Primary Sites: Not applicable Tumor Extension: Carcinoma invades into Cory serosal surface Lymphovascular Invasion: Not identified Perineural Invasion: Not identified Treatment Effect: No known presurgical therapy Margins:      Margin Status for Invasive Carcinoma: All margins negative for invasive carcinoma      Margin Status for Non-Invasive Tumor: All margins negative for high-grade dysplasia / intramucosal           carcinoma and low-grade dysplasia Regional Lymph Nodes:      Number of Lymph Nodes with Tumor: 1      Number of Lymph Nodes Examined: 45 Tumor Deposits: Not identified Distant Metastasis:      Distant Site(s) Involved: Not applicable Pathologic Stage Classification (pTNM, AJCC 8th Edition): pT4a, pN1a Ancillary Studies: MMR / MSI testing will be ordered. Representative Tumor Block: A2 Comments: None (v4.2.0.1)     09/30/2023 Initial Diagnosis   Cancer of sigmoid colon - pT4a, pN1a - s/p roboric LAR resection 09/27/2023   10/08/2023 Imaging   CT Chest with contrast  IMPRESSION: Negative. No evidence of thoracic metastatic disease or other significant abnormality.   10/23/2023 -  Chemotherapy   Gomez is on Treatment Plan : COLORECTAL FOLFOX q14d x 6 months      Genetic Testing   [facility] CancerNext-Expanded Panel+RNA was Negative. Report date is 11/15/2023.   [facility] CancerNext-Expanded gene panel offered by [facility] and includes sequencing, rearrangement, and RNA analysis for Cory following 71 genes: AIP, ALK, APC, ATM, AXIN2, BAP1, BARD1,  BMPR1A, BRCA1, BRCA2, BRIP1, CDC73, CDH1, CDK4, CDKN1B, CDKN2A, CHEK2, CTNNA1, DICER1, FH, FLCN, KIF1B, LZTR1, MAX, MEN1, MET, MLH1, MSH2, MSH3, MSH6, MUTYH, NF1, NF2, NTHL1, PALB2, PHOX2B, PMS2, POT1, PRKAR1A, PTCH1, PTEN, RAD51C, RAD51D, RB1, RET, SDHA, SDHAF2, SDHB, SDHC, SDHD, SMAD4, SMARCA4, SMARCB1, SMARCE1, STK11, SUFU, TMEM127, TP53, TSC1, TSC2, and VHL (sequencing and deletion/duplication); EGFR, EGLN1, HOXB13, KIT, MITF, PDGFRA, POLD1, and POLE (sequencing only); EPCAM and GREM1 (deletion/duplication only).        Discussed Cory use of AI scribe software for clinical note transcription with Cory Gomez, who gave verbal consent to proceed.  History of Present Illness   Cory Gomez, a 43 year old with a history of colon cancer, presents for a follow-up visit. He reports fatigue and cold intolerance lasting about a week and a half after his last chemotherapy session, which is longer than Cory usual three to four days. He denies any numbness or tingling in his feet or toes. He also reports that Cory reduced chemotherapy dose from his last visit helped to manage Cory side effects and fatigue. He is currently on cycle ten of his chemotherapy treatment and has two more cycles left. He also reports an increase in acne, which he manages with Panox. He denies any stomach pain or other issues. He is also on long-term disability and is considering returning to work a few months after completing chemotherapy.         All other systems were reviewed with Cory Gomez and are negative.  MEDICAL HISTORY:  Past Medical History:  Diagnosis Date   ADHD (attention deficit hyperactivity disorder)    Annual physical exam 09/09/2019   Anxiety and depression    Cancer (HCC)    Colon cancer (HCC)  COVID-19    12/28/19   Depression, recurrent (HCC) 09/09/2019   Headache 12/31/2021   History of kidney stones    Hypertriglyceridemia 07/17/2021   Insomnia    Inverted nipple 09/16/2019   Kidney stone     Mastoiditis of both sides    Nystagmus 01/22/2022   Obesity (BMI 30-39.9)    Obesity (BMI 30-39.9)    Suicidal ideation 07/14/2021   Vertigo     SURGICAL HISTORY: Past Surgical History:  Procedure Laterality Date   COLON RESECTION  09/25/2023   PORTACATH PLACEMENT Right 10/17/2023   Procedure: INSERTION PORT-A-CATH WITH Gaspar Skeeters GUIDANCE;  Surgeon: Karie Soda, MD;  Location: [facility];  Service: General;  Laterality: Right;   PROCTOSCOPY N/A 09/25/2023   Procedure: RIGID PROCTOSCOPY;  Surgeon: Karie Soda, MD;  Location: [facility];  Service: General;  Laterality: N/A;    I have reviewed Cory social history and family history with Cory Gomez and they are unchanged from previous note.  ALLERGIES:  is allergic to ambien [zolpidem].  MEDICATIONS:  Current Outpatient Medications  Medication Sig Dispense Refill   Acetaminophen (TYLENOL PO) Take 500 mg by mouth every 6 (six) hours.     buPROPion (WELLBUTRIN XL) 150 MG 24 hr tablet Take 1 tablet (150 mg total) by mouth every morning. 30 tablet 2   DULoxetine (CYMBALTA) 60 MG capsule Take 1 capsule (60 mg total) by mouth 2 (two) times daily. 60 capsule 2   eszopiclone (LUNESTA) 2 MG TABS tablet Take 1 tablet (2 mg total) by mouth at bedtime as needed for sleep. Take immediately before bedtime 30 tablet 1   lidocaine-prilocaine (EMLA) cream Apply to affected area once (Gomez taking differently: Apply 1 Application topically as needed (for chemo port, apply three hours before treatment).) 30 g 3   ondansetron (ZOFRAN) 8 MG tablet Take 1 tablet (8 mg total) by mouth every 8 (eight) hours as needed for nausea or vomiting. 30 tablet 1   prochlorperazine (COMPAZINE) 10 MG tablet Take 1 tablet (10 mg total) by mouth every 6 (six) hours as needed for nausea or vomiting. (Gomez taking differently: Take 10 mg by mouth every 6 (six) hours as needed for nausea or vomiting. Have not started) 30 tablet 1   propranolol (INDERAL) 10 MG  tablet TAKE 1 TABLET(10 MG) BY MOUTH TWICE DAILY AS NEEDED 60 tablet 2   No current facility-administered medications for this visit.   Facility-Administered Medications Ordered in Other Visits  Medication Dose Route Frequency Provider Last Rate Last Admin   dextrose 5 % solution   Intravenous Continuous Malachy Mood, MD   Stopped at 03/18/24 1553   fluorouracil (ADRUCIL) 6,100 mg in sodium chloride 0.9 % 128 mL chemo infusion  2,400 mg/m2 (Treatment Plan Recorded) Intravenous 1 day or 1 dose Malachy Mood, MD   Infusion Verify at 03/18/24 1632   sodium chloride flush (NS) 0.9 % injection 10 mL  10 mL Intracatheter PRN Malachy Mood, MD        PHYSICAL EXAMINATION: ECOG PERFORMANCE STATUS: 1 - Symptomatic but completely ambulatory  Vitals:   03/18/24 0921 03/18/24 0928  BP: (!) 165/100 (!) 160/84  Pulse: 90   Resp: 18   Temp: 98 F (36.7 C)   SpO2: 99%    Wt Readings from Last 3 Encounters:  03/18/24 (!) 313 lb 6.4 oz (142.2 kg)  03/04/24 (!) 304 lb 14.4 oz (138.3 kg)  02/19/24 298 lb 1.6 oz (135.2 kg)     GENERAL:alert, no distress  and comfortable SKIN: skin color, texture, turgor are normal, no rashes or significant lesions EYES: normal, Conjunctiva are pink and non-injected, sclera clear NECK: supple, thyroid normal size, non-tender, without nodularity LYMPH:  no palpable lymphadenopathy in Cory cervical, axillary  LUNGS: clear to auscultation and percussion with normal breathing effort HEART: regular rate & rhythm and no murmurs and no lower extremity edema ABDOMEN:abdomen soft, non-tender and normal bowel sounds Musculoskeletal:no cyanosis of digits and no clubbing   LABORATORY DATA:  I have reviewed Cory data as listed    Latest Ref Rng & Units 03/18/2024    8:37 AM 03/04/2024    9:38 AM 02/19/2024    9:30 AM  CBC  WBC 4.0 - 10.5 K/uL 2.7  3.1  2.8   Hemoglobin 13.0 - 17.0 g/dL 86.5  78.4  69.6   Hematocrit 39.0 - 52.0 % 35.3  35.2  37.3   Platelets 150 - 400 K/uL 156  181   240         Latest Ref Rng & Units 03/18/2024    8:37 AM 03/04/2024    9:38 AM 02/19/2024    9:30 AM  CMP  Glucose 70 - 99 mg/dL 295  284  132   BUN 6 - 20 mg/dL 7  11  17    Creatinine 0.61 - 1.24 mg/dL 4.40  1.02  7.25   Sodium 135 - 145 mmol/L 139  138  138   Potassium 3.5 - 5.1 mmol/L 4.3  3.8  3.7   Chloride 98 - 111 mmol/L 108  105  106   CO2 22 - 32 mmol/L 25  27  25    Calcium 8.9 - 10.3 mg/dL 9.0  9.1  9.6   Total Protein 6.5 - 8.1 g/dL 6.9  7.1  7.4   Total Bilirubin 0.0 - 1.2 mg/dL 0.4  0.4  0.4   Alkaline Phos 38 - 126 U/L 80  66  58   AST 15 - 41 U/L 27  20  20    ALT 0 - 44 U/L 29  23  25        RADIOGRAPHIC STUDIES: I have personally reviewed Cory radiological images as listed and agreed with Cory findings in Cory report. No results found.    Orders Placed This Encounter  Procedures   CT CHEST ABDOMEN PELVIS W CONTRAST    Wt: 313 / not diabetic/ no kidney problems or kidney diseases/ yes both kidneys No spinal stimulator/no body injector/no glucose or heart monitor/ YES port Gomez would like it to be accessed talked to [facility] nurse  No special needs/ Allergic to contrast NO Ins: [employer] S/w Gomez  epic  order/LM Pt needs to p/u contrast  pt aware of $75 no show fee    Standing Status:   Future    Expected Date:   03/31/2024    Expiration Date:   03/18/2025    If indicated for Cory ordered procedure, I authorize Cory administration of contrast media per Radiology protocol:   Yes    Does Cory Gomez have a contrast media/X-Haydn dye allergy?:   No    Preferred imaging location?:   [facility]    If indicated for Cory ordered procedure, I authorize Cory administration of oral contrast media per Radiology protocol:   Yes   All questions were answered. Cory Gomez knows to call Cory clinic with any problems, questions or concerns. No barriers to learning was detected. Cory total time spent in Cory appointment was 25 minutes.  Malachy Mood, MD 03/18/2024

## 2024-03-18 NOTE — Patient Instructions (Signed)
 [facility] - [facility] DE[facility]  Discharge Instructions: Thank you for choosing [facility] to provide your oncology and hematology care.   If you have a lab appointment with the Cancer Center, please go directly to the Cancer Center and check in at the registration area.   Wear comfortable clothing and clothing appropriate for easy access to any Portacath or PICC line.   We strive to give you quality time with your provider. You may need to reschedule your appointment if you arrive late (15 or more minutes).  Arriving late affects you and other patients whose appointments are after yours.  Also, if you miss three or more appointments without notifying the office, you may be dismissed from the clinic at the provider's discretion.      For prescription refill requests, have your pharmacy contact our office and allow 72 hours for refills to be completed.    Today you received the following chemotherapy and/or immunotherapy agents :  FOLFOX      To help prevent nausea and vomiting after your treatment, we encourage you to take your nausea medication as directed.  BELOW ARE SYMPTOMS THAT SHOULD BE REPORTED IMMEDIATELY: *FEVER GREATER THAN 100.4 F (38 C) OR HIGHER *CHILLS OR SWEATING *NAUSEA AND VOMITING THAT IS NOT CONTROLLED WITH YOUR NAUSEA MEDICATION *UNUSUAL SHORTNESS OF BREATH *UNUSUAL BRUISING OR BLEEDING *URINARY PROBLEMS (pain or burning when urinating, or frequent urination) *BOWEL PROBLEMS (unusual diarrhea, constipation, pain near the anus) TENDERNESS IN MOUTH AND THROAT WITH OR WITHOUT PRESENCE OF ULCERS (sore throat, sores in mouth, or a toothache) UNUSUAL RASH, SWELLING OR PAIN  UNUSUAL VAGINAL DISCHARGE OR ITCHING   Items with * indicate a potential emergency and should be followed up as soon as possible or go to the Emergency Department if any problems should occur.  Please show the CHEMOTHERAPY ALERT CARD or IMMUNOTHERAPY  ALERT CARD at check-in to the Emergency Department and triage nurse.  Should you have questions after your visit or need to cancel or reschedule your appointment, please contact [facility] - A DEPT OF [facility]  Dept: [phone]  and follow the prompts.  Office hours are 8:00 a.m. to 4:30 p.m. Monday - Friday. Please note that voicemails left after 4:00 p.m. may not be returned until the following business day.  We are closed weekends and major holidays. You have access to a nurse at all times for urgent questions. Please call the main number to the clinic Dept: [phone] and follow the prompts.   For any non-urgent questions, you may also contact your provider using MyChart. We now offer e-Visits for anyone 61 and older to request care online for non-urgent symptoms. For details visit [email or web address].   Also download the MyChart app! Go to the app store, search "MyChart", open the app, select [place], and log in with your MyChart username and password.

## 2024-03-19 ENCOUNTER — Ambulatory Visit: Payer: BC Managed Care – PPO

## 2024-03-19 ENCOUNTER — Other Ambulatory Visit: Payer: BC Managed Care – PPO

## 2024-03-19 ENCOUNTER — Telehealth (HOSPITAL_COMMUNITY): Payer: BC Managed Care – PPO | Admitting: Psychiatry

## 2024-03-19 ENCOUNTER — Ambulatory Visit: Payer: BC Managed Care – PPO | Admitting: Hematology

## 2024-03-19 DIAGNOSIS — F411 Generalized anxiety disorder: Secondary | ICD-10-CM | POA: Diagnosis not present

## 2024-03-19 DIAGNOSIS — F330 Major depressive disorder, recurrent, mild: Secondary | ICD-10-CM

## 2024-03-19 DIAGNOSIS — F5105 Insomnia due to other mental disorder: Secondary | ICD-10-CM | POA: Diagnosis not present

## 2024-03-19 MED ORDER — DULOXETINE HCL 60 MG PO CPEP: 60.0000 mg | ORAL_CAPSULE | Freq: Two times a day (BID) | ORAL | 2 refills | Status: DC

## 2024-03-20 ENCOUNTER — Inpatient Hospital Stay: Payer: BC Managed Care – PPO

## 2024-03-20 ENCOUNTER — Encounter (HOSPITAL_COMMUNITY): Payer: Self-pay | Admitting: Psychiatry

## 2024-03-20 VITALS — BP 138/95 | HR 99 | Temp 98.4°F | Resp 18

## 2024-03-20 DIAGNOSIS — C187 Malignant neoplasm of sigmoid colon: Secondary | ICD-10-CM | POA: Diagnosis not present

## 2024-03-20 DIAGNOSIS — Z5111 Encounter for antineoplastic chemotherapy: Secondary | ICD-10-CM | POA: Diagnosis not present

## 2024-03-20 DIAGNOSIS — L709 Acne, unspecified: Secondary | ICD-10-CM | POA: Diagnosis not present

## 2024-03-20 DIAGNOSIS — Z452 Encounter for adjustment and management of vascular access device: Secondary | ICD-10-CM | POA: Diagnosis not present

## 2024-03-20 DIAGNOSIS — G620 Drug-induced polyneuropathy: Secondary | ICD-10-CM | POA: Diagnosis not present

## 2024-03-20 DIAGNOSIS — Z95828 Presence of other vascular implants and grafts: Secondary | ICD-10-CM

## 2024-03-20 DIAGNOSIS — I10 Essential (primary) hypertension: Secondary | ICD-10-CM | POA: Diagnosis not present

## 2024-03-20 MED ORDER — HEPARIN SOD (PORK) LOCK FLUSH 100 UNIT/ML IV SOLN
500.0000 [IU] | Freq: Once | INTRAVENOUS | Status: AC | PRN
  Administered 2024-03-20: 500 [IU]

## 2024-03-20 MED ORDER — SODIUM CHLORIDE 0.9% FLUSH
10.0000 mL | INTRAVENOUS | Status: DC | PRN
  Administered 2024-03-20: 10 mL

## 2024-03-22 DIAGNOSIS — C187 Malignant neoplasm of sigmoid colon: Secondary | ICD-10-CM | POA: Diagnosis not present

## 2024-03-23 ENCOUNTER — Ambulatory Visit: Payer: BC Managed Care – PPO

## 2024-03-23 ENCOUNTER — Other Ambulatory Visit

## 2024-03-23 DIAGNOSIS — E559 Vitamin D deficiency, unspecified: Secondary | ICD-10-CM | POA: Diagnosis not present

## 2024-03-23 LAB — VITAMIN D 25 HYDROXY (VIT D DEFICIENCY, FRACTURES): VITD: 11.22 ng/mL — ABNORMAL LOW (ref 30.00–100.00)

## 2024-03-25 ENCOUNTER — Other Ambulatory Visit: Payer: Self-pay | Admitting: Hematology

## 2024-03-25 ENCOUNTER — Ambulatory Visit
Admission: RE | Admit: 2024-03-25 | Discharge: 2024-03-25 | Disposition: A | Discharge status: Home / Self Care | Source: Ambulatory Visit | Attending: Hematology

## 2024-03-25 DIAGNOSIS — K5900 Constipation, unspecified: Secondary | ICD-10-CM | POA: Diagnosis not present

## 2024-03-25 DIAGNOSIS — C187 Malignant neoplasm of sigmoid colon: Secondary | ICD-10-CM

## 2024-03-25 DIAGNOSIS — Z85038 Personal history of other malignant neoplasm of large intestine: Secondary | ICD-10-CM | POA: Diagnosis not present

## 2024-03-25 MED ORDER — IOPAMIDOL (ISOVUE-300) INJECTION 61%
100.0000 mL | Freq: Once | INTRAVENOUS | Status: AC | PRN
  Administered 2024-03-25: 100 mL via INTRAVENOUS

## 2024-03-25 MED ORDER — HEPARIN SOD (PORK) LOCK FLUSH 100 UNIT/ML IV SOLN
500.0000 [IU] | Freq: Once | INTRAVENOUS | Status: AC
  Administered 2024-03-25: 500 [IU] via INTRAVENOUS

## 2024-03-25 MED ORDER — SODIUM CHLORIDE 0.9% FLUSH
10.0000 mL | INTRAVENOUS | Status: DC | PRN
  Administered 2024-03-25: 10 mL via INTRAVENOUS

## 2024-03-29 ENCOUNTER — Other Ambulatory Visit: Payer: Self-pay | Admitting: Family Medicine

## 2024-03-29 DIAGNOSIS — E559 Vitamin D deficiency, unspecified: Secondary | ICD-10-CM

## 2024-03-29 MED ORDER — VITAMIN D (ERGOCALCIFEROL) 1.25 MG (50000 UNIT) PO CAPS: 50000.0000 [IU] | ORAL_CAPSULE | ORAL | 3 refills | Status: AC

## 2024-03-31 NOTE — Assessment & Plan Note (Addendum)
-  pT4aN1M0 stage IIIB, MMS, G2 -Presented with perforated diverticulitis in sigmoid colon in July, 2024. -Patient underwent laparoscopic resection of sigmoid colon, path reviewed moderate differentiated adenocarcinoma with 1 positive lymph nodes. -I recommend adjuvant chemotherapy FOLFOX for 6 months if he can tolerate. -Surveillance CT scan on March 25, 2024 was negative for recurrence.

## 2024-04-01 ENCOUNTER — Inpatient Hospital Stay (HOSPITAL_BASED_OUTPATIENT_CLINIC_OR_DEPARTMENT_OTHER): Admitting: Hematology

## 2024-04-01 ENCOUNTER — Encounter: Payer: Self-pay | Admitting: Hematology

## 2024-04-01 ENCOUNTER — Inpatient Hospital Stay: Attending: Nurse Practitioner

## 2024-04-01 ENCOUNTER — Inpatient Hospital Stay

## 2024-04-01 VITALS — BP 140/82 | HR 106 | Temp 97.9°F | Resp 19 | Ht 74.0 in | Wt 309.0 lb

## 2024-04-01 VITALS — HR 100

## 2024-04-01 DIAGNOSIS — C187 Malignant neoplasm of sigmoid colon: Secondary | ICD-10-CM | POA: Diagnosis not present

## 2024-04-01 DIAGNOSIS — K760 Fatty (change of) liver, not elsewhere classified: Secondary | ICD-10-CM | POA: Insufficient documentation

## 2024-04-01 DIAGNOSIS — Z95828 Presence of other vascular implants and grafts: Secondary | ICD-10-CM

## 2024-04-01 DIAGNOSIS — Z5111 Encounter for antineoplastic chemotherapy: Secondary | ICD-10-CM | POA: Insufficient documentation

## 2024-04-01 DIAGNOSIS — E559 Vitamin D deficiency, unspecified: Secondary | ICD-10-CM | POA: Insufficient documentation

## 2024-04-01 DIAGNOSIS — Z452 Encounter for adjustment and management of vascular access device: Secondary | ICD-10-CM | POA: Insufficient documentation

## 2024-04-01 LAB — CMP (CANCER CENTER ONLY)
ALT: 27 U/L (ref 0–44)
AST: 23 U/L (ref 15–41)
Albumin: 4.4 g/dL (ref 3.5–5.0)
Alkaline Phosphatase: 60 U/L (ref 38–126)
Anion gap: 7 (ref 5–15)
BUN: 12 mg/dL (ref 6–20)
CO2: 26 mmol/L (ref 22–32)
Calcium: 9.3 mg/dL (ref 8.9–10.3)
Chloride: 104 mmol/L (ref 98–111)
Creatinine: 0.78 mg/dL (ref 0.61–1.24)
GFR, Estimated: >60 mL/min (ref >60)
Glucose, Bld: 103 mg/dL — ABNORMAL HIGH (ref 70–99)
Potassium: 4 mmol/L (ref 3.5–5.1)
Sodium: 137 mmol/L (ref 135–145)
Total Bilirubin: 0.5 mg/dL (ref 0.0–1.2)
Total Protein: 7.5 g/dL (ref 6.5–8.1)

## 2024-04-01 LAB — CBC WITH DIFFERENTIAL (CANCER CENTER ONLY)
Abs Immature Granulocytes: 0.02 10*3/uL (ref 0.00–0.07)
Basophils Absolute: 0 10*3/uL (ref 0.0–0.1)
Basophils Relative: 1 %
Eosinophils Absolute: 0 10*3/uL (ref 0.0–0.5)
Eosinophils Relative: 1 %
HCT: 35.3 % — ABNORMAL LOW (ref 39.0–52.0)
Hemoglobin: 12.1 g/dL — ABNORMAL LOW (ref 13.0–17.0)
Immature Granulocytes: 1 %
Lymphocytes Relative: 39 %
Lymphs Abs: 1.2 10*3/uL (ref 0.7–4.0)
MCH: 28.5 pg (ref 26.0–34.0)
MCHC: 34.3 g/dL (ref 30.0–36.0)
MCV: 83.3 fL (ref 80.0–100.0)
Monocytes Absolute: 0.4 10*3/uL (ref 0.1–1.0)
Monocytes Relative: 14 %
Neutro Abs: 1.3 10*3/uL — ABNORMAL LOW (ref 1.7–7.7)
Neutrophils Relative %: 44 %
Platelet Count: 169 10*3/uL (ref 150–400)
RBC: 4.24 MIL/uL (ref 4.22–5.81)
RDW: 15.9 % — ABNORMAL HIGH (ref 11.5–15.5)
WBC Count: 3 10*3/uL — ABNORMAL LOW (ref 4.0–10.5)
nRBC: 0 % (ref 0.0–0.2)

## 2024-04-01 MED ORDER — FAMOTIDINE IN NACL 20-0.9 MG/50ML-% IV SOLN
20.0000 mg | Freq: Once | INTRAVENOUS | Status: AC
  Administered 2024-04-01: 20 mg via INTRAVENOUS

## 2024-04-01 MED ORDER — SODIUM CHLORIDE 0.9% FLUSH
10.0000 mL | INTRAVENOUS | Status: DC | PRN
  Administered 2024-04-01: 10 mL

## 2024-04-01 MED ORDER — FLUOROURACIL CHEMO INJECTION 2.5 GM/50ML
400.0000 mg/m2 | Freq: Once | INTRAVENOUS | Status: AC
  Administered 2024-04-01: 1000 mg via INTRAVENOUS
  Filled 2024-04-01: qty 20

## 2024-04-01 MED ORDER — PALONOSETRON HCL INJECTION 0.25 MG/5ML
0.2500 mg | Freq: Once | INTRAVENOUS | Status: AC
  Administered 2024-04-01: 0.25 mg via INTRAVENOUS

## 2024-04-01 MED ORDER — OXALIPLATIN CHEMO INJECTION 100 MG/20ML
60.0000 mg/m2 | Freq: Once | INTRAVENOUS | Status: AC
  Administered 2024-04-01: 150 mg via INTRAVENOUS
  Filled 2024-04-01: qty 10

## 2024-04-01 MED ORDER — SODIUM CHLORIDE 0.9 % IV SOLN
2400.0000 mg/m2 | INTRAVENOUS | Status: DC
  Administered 2024-04-01: 6100 mg via INTRAVENOUS
  Filled 2024-04-01: qty 122

## 2024-04-01 MED ORDER — DEXTROSE 5 % IV SOLN: INTRAVENOUS | Status: DC

## 2024-04-01 MED ORDER — LEUCOVORIN CALCIUM INJECTION 350 MG
400.0000 mg/m2 | Freq: Once | INTRAVENOUS | Status: AC
  Administered 2024-04-01: 1020 mg via INTRAVENOUS
  Filled 2024-04-01: qty 50

## 2024-04-01 MED ORDER — DEXAMETHASONE SODIUM PHOSPHATE 10 MG/ML IJ SOLN
10.0000 mg | Freq: Once | INTRAMUSCULAR | Status: AC
  Administered 2024-04-01: 10 mg via INTRAVENOUS

## 2024-04-01 MED ORDER — LORATADINE 10 MG PO TABS
10.0000 mg | ORAL_TABLET | Freq: Once | ORAL | Status: AC
  Administered 2024-04-01: 10 mg via ORAL

## 2024-04-01 NOTE — Patient Instructions (Signed)
 [facility]  Discharge Instructions: Thank you for choosing [facility] to provide your oncology and hematology care.   If you have a lab appointment with the Cancer Center, please go directly to the Cancer Center and check in at the registration area.   Wear comfortable clothing and clothing appropriate for easy access to any Portacath or PICC line.   We strive to give you quality time with your provider. You may need to reschedule your appointment if you arrive late (15 or more minutes).  Arriving late affects you and other patients whose appointments are after yours.  Also, if you miss three or more appointments without notifying the office, you may be dismissed from the clinic at the provider's discretion.      For prescription refill requests, have your pharmacy contact our office and allow 72 hours for refills to be completed.    Today you received the following chemotherapy and/or immunotherapy agents Oxaliplatin, Fluorourcil, Leucovorin      To help prevent nausea and vomiting after your treatment, we encourage you to take your nausea medication as directed.  BELOW ARE SYMPTOMS THAT SHOULD BE REPORTED IMMEDIATELY: *FEVER GREATER THAN 100.4 F (38 C) OR HIGHER *CHILLS OR SWEATING *NAUSEA AND VOMITING THAT IS NOT CONTROLLED WITH YOUR NAUSEA MEDICATION *UNUSUAL SHORTNESS OF BREATH *UNUSUAL BRUISING OR BLEEDING *URINARY PROBLEMS (pain or burning when urinating, or frequent urination) *BOWEL PROBLEMS (unusual diarrhea, constipation, pain near the anus) TENDERNESS IN MOUTH AND THROAT WITH OR WITHOUT PRESENCE OF ULCERS (sore throat, sores in mouth, or a toothache) UNUSUAL RASH, SWELLING OR PAIN  UNUSUAL VAGINAL DISCHARGE OR ITCHING   Items with * indicate a potential emergency and should be followed up as soon as possible or go to the Emergency Department if any problems should occur.  Please show the CHEMOTHERAPY  ALERT CARD or IMMUNOTHERAPY ALERT CARD at check-in to the Emergency Department and triage nurse.  Should you have questions after your visit or need to cancel or reschedule your appointment, please contact [facility]  Dept: [phone]  and follow the prompts.  Office hours are 8:00 a.m. to 4:30 p.m. Monday - Friday. Please note that voicemails left after 4:00 p.m. may not be returned until the following business day.  We are closed weekends and major holidays. You have access to a nurse at all times for urgent questions. Please call the main number to the clinic Dept: [phone] and follow the prompts.   For any non-urgent questions, you may also contact your provider using MyChart. We now offer e-Visits for anyone 66 and older to request care online for non-urgent symptoms. For details visit [email or web address].   Also download the MyChart app! Go to the app store, search "MyChart", open the app, select [place], and log in with your MyChart username and password.

## 2024-04-01 NOTE — Progress Notes (Signed)
 [facility]   [phone] [phone]   Clinic Follow up Note   Patient Care Team: [name], MD as PCP - General (Family Medicine) Karie Soda, MD as Consulting Physician (General Surgery) Hilarie Fredrickson, MD as Consulting Physician (Gastroenterology) Malachy Mood, MD as Consulting Physician (Medical Oncology) Suanne Marker, MD as Consulting Physician (Neurology) Laren Boom, DO as Consulting Physician (Otolaryngology)  Date of Service:  04/01/2024  CHIEF COMPLAINT: f/u of colon cancer  CURRENT THERAPY:  Adjuvant chemotherapy FOLFOX  Oncology History   Cancer of sigmoid colon - pT4a, pN1a - s/p roboric LAR resection 09/27/2023 -pT4aN1M0 stage IIIB, MMS, G2 -Presented with perforated diverticulitis in sigmoid colon in July, 2024. -Patient underwent laparoscopic resection of sigmoid colon, path reviewed moderate differentiated adenocarcinoma with 1 positive lymph nodes. -I recommend adjuvant chemotherapy FOLFOX for 6 months if he can tolerate. -Surveillance CT scan on March 25, 2024 was negative for recurrence.  Assessment & Plan Colon cancer Recent CT scan on March 25, 2024, showed no evidence of recurrence, though post-surgical changes are present. Microscopic disease cannot be ruled out. He is undergoing chemotherapy, resulting in leukopenia, but is tolerating it well without significant neuropathy or other side effects. Post-treatment recovery is expected to be 70-80% in terms of energy levels within 1-2 months, with most individuals returning to work within this timeframe. - Continue chemotherapy with the same reduced dose of oxaliplatin as the last cycle. - Administer one more chemotherapy treatment on April 14, 2024. - Order Signatera blood test for circulating tumor DNA in 2-3 months. - Schedule the next CT scan in 6 months. - Follow up with routine lab tests and tumor DNA test in 6 weeks after the last treatment. - Schedule  follow-up visits every 3 months after the next scan. - Plan for a colonoscopy later this year, approximately one year post-surgery.  Fatty liver CT scan indicated signal change in the liver, likely related to timing of contrast or fatty liver. No definitive evidence of liver disease. - Advise healthy diet and weight management to control fatty liver.  Vitamin D deficiency Primary care physician identified low vitamin D levels and prescribed supplements. He is concerned about potential interactions with current medications. - Approve the use of vitamin D supplements as prescribed by the primary care physician.  Plan -CT scan reviewed, no evidence of recurrence.   -Lab reviewed, adequate for treatment, will proceed to cycle 11 FOLFOX today at the same reduced oxaliplatin dose -Follow-up in 2 weeks before last cycle chemo -I filled out a form for him today.     SUMMARY OF ONCOLOGIC HISTORY: Oncology History  Cancer of sigmoid colon - pT4a, pN1a - s/p roboric LAR resection 09/27/2023  09/25/2023 Cancer Staging   Staging form: Colon and Rectum, AJCC 8th Edition - Pathologic stage from 09/25/2023: Stage IIIB (pT4a, pN1a, cM0) - Signed by Carlean Jews, NP on 10/09/2023 Total positive nodes: 1 Histologic grading system: 4 grade system Histologic grade (G): G2 Residual tumor (R): R0 - None   09/25/2023 Surgery   Laparoscopic resection of sigmoid colon  Invasive, moderately differentiated adenocarcinoma of sigmoid colon. Invades into werosal surface. Margins are clear. Negative lymphovascular and perineural invasion. Metastasis to 1/45 lymph nodes.    09/25/2023 Pathology Results   FINAL MICROSCOPIC DIAGNOSIS:  A.   COLON, RESECTION: - Invasive moderately differentiated adenocarcinoma, 7.8 cm involving sigmoid colon - Carcinoma invades into the serosal surface - Resection margins are negative for carcinoma - Negative for  lymphovascular or perineural invasion - Metastatic carcinoma to  one of forty-five lymph nodes (1/45) - See oncology table B.   COLON, DISTAL MARGIN, EXCISION: - Colonic donut within normal limits, negative for carcinoma  ONCOLOGY TABLE:  COLON AND RECTUM, CARCINOMA:  Resection, Including Transanal Disk Excision of Rectal Neoplasms  Procedure: Resection, rectosigmoid colon Tumor Site: Sigmoid colon Tumor Size: 7.8 cm Macroscopic Tumor Perforation: Not identified  Histologic Type: Adenocarcinoma Histologic Grade: G2: Moderately differentiated Multiple Primary Sites: Not applicable Tumor Extension: Carcinoma invades into the serosal surface Lymphovascular Invasion: Not identified Perineural Invasion: Not identified Treatment Effect: No known presurgical therapy Margins:      Margin Status for Invasive Carcinoma: All margins negative for invasive carcinoma      Margin Status for Non-Invasive Tumor: All margins negative for high-grade dysplasia / intramucosal           carcinoma and low-grade dysplasia Regional Lymph Nodes:      Number of Lymph Nodes with Tumor: 1      Number of Lymph Nodes Examined: 45 Tumor Deposits: Not identified Distant Metastasis:      Distant Site(s) Involved: Not applicable Pathologic Stage Classification (pTNM, AJCC 8th Edition): pT4a, pN1a Ancillary Studies: MMR / MSI testing will be ordered. Representative Tumor Block: A2 Comments: None (v4.2.0.1)     09/30/2023 Initial Diagnosis   Cancer of sigmoid colon - pT4a, pN1a - s/p roboric LAR resection 09/27/2023   10/08/2023 Imaging   CT Chest with contrast  IMPRESSION: Negative. No evidence of thoracic metastatic disease or other significant abnormality.   10/23/2023 -  Chemotherapy   Patient is on Treatment Plan : COLORECTAL FOLFOX q14d x 6 months      Genetic Testing   [facility] CancerNext-Expanded Panel+RNA was Negative. Report date is 11/15/2023.   The CancerNext-Expanded gene panel offered by [facility] and includes sequencing, rearrangement, and RNA  analysis for the following 71 genes: AIP, ALK, APC, ATM, AXIN2, BAP1, BARD1, BMPR1A, BRCA1, BRCA2, BRIP1, CDC73, CDH1, CDK4, CDKN1B, CDKN2A, CHEK2, CTNNA1, DICER1, FH, FLCN, KIF1B, LZTR1, MAX, MEN1, MET, MLH1, MSH2, MSH3, MSH6, MUTYH, NF1, NF2, NTHL1, PALB2, PHOX2B, PMS2, POT1, PRKAR1A, PTCH1, PTEN, RAD51C, RAD51D, RB1, RET, SDHA, SDHAF2, SDHB, SDHC, SDHD, SMAD4, SMARCA4, SMARCB1, SMARCE1, STK11, SUFU, TMEM127, TP53, TSC1, TSC2, and VHL (sequencing and deletion/duplication); EGFR, EGLN1, HOXB13, KIT, MITF, PDGFRA, POLD1, and POLE (sequencing only); EPCAM and GREM1 (deletion/duplication only).        Discussed the use of AI scribe software for clinical note transcription with the patient, who gave verbal consent to proceed.  History of Present Illness A 43 year old patient with a history of colon cancer presents for a follow-up visit. The patient reports no new issues since the last visit. He mentions an extended COVID tolerance that lasted about six days, longer than the usual three days. The patient denies experiencing neuropathy, numbness, or tingling and has no difficulty with tasks requiring fine motor skills such as typing or grabbing things. The patient is due for one more treatment and is planning to return to work in three to four months. The patient also mentions a recent prescription for vitamin D pills from his primary care physician, suggesting a possible deficiency.     All other systems were reviewed with the patient and are negative.  MEDICAL HISTORY:  Past Medical History:  Diagnosis Date   ADHD (attention deficit hyperactivity disorder)    Annual physical exam 09/09/2019   Anxiety and depression    Cancer (HCC)    Colon cancer (  HCC)    COVID-19    12/28/19   Depression, recurrent (HCC) 09/09/2019   Headache 12/31/2021   History of kidney stones    Hypertriglyceridemia 07/17/2021   Insomnia    Inverted nipple 09/16/2019   Kidney stone    Mastoiditis of both sides     Nystagmus 01/22/2022   Obesity (BMI 30-39.9)    Obesity (BMI 30-39.9)    Suicidal ideation 07/14/2021   Vertigo     SURGICAL HISTORY: Past Surgical History:  Procedure Laterality Date   COLON RESECTION  09/25/2023   PORTACATH PLACEMENT Right 10/17/2023   Procedure: INSERTION PORT-A-CATH WITH Gaspar Skeeters GUIDANCE;  Surgeon: Karie Soda, MD;  Location: [facility];  Service: General;  Laterality: Right;   PROCTOSCOPY N/A 09/25/2023   Procedure: RIGID PROCTOSCOPY;  Surgeon: Karie Soda, MD;  Location: [facility];  Service: General;  Laterality: N/A;    I have reviewed the social history and family history with the patient and they are unchanged from previous note.  ALLERGIES:  is allergic to ambien [zolpidem].  MEDICATIONS:  Current Outpatient Medications  Medication Sig Dispense Refill   Acetaminophen (TYLENOL PO) Take 500 mg by mouth every 6 (six) hours.     buPROPion (WELLBUTRIN XL) 150 MG 24 hr tablet Take 1 tablet (150 mg total) by mouth every morning. 30 tablet 2   DULoxetine (CYMBALTA) 60 MG capsule Take 1 capsule (60 mg total) by mouth 2 (two) times daily. 60 capsule 2   eszopiclone (LUNESTA) 2 MG TABS tablet Take 1 tablet (2 mg total) by mouth at bedtime as needed for sleep. Take immediately before bedtime 30 tablet 1   lidocaine-prilocaine (EMLA) cream Apply to affected area once (Patient taking differently: Apply 1 Application topically as needed (for chemo port, apply three hours before treatment).) 30 g 3   ondansetron (ZOFRAN) 8 MG tablet Take 1 tablet (8 mg total) by mouth every 8 (eight) hours as needed for nausea or vomiting. 30 tablet 1   prochlorperazine (COMPAZINE) 10 MG tablet Take 1 tablet (10 mg total) by mouth every 6 (six) hours as needed for nausea or vomiting. (Patient taking differently: Take 10 mg by mouth every 6 (six) hours as needed for nausea or vomiting. Have not started) 30 tablet 1   propranolol (INDERAL) 10 MG tablet TAKE 1 TABLET(10 MG)  BY MOUTH TWICE DAILY AS NEEDED 60 tablet 2   Vitamin D, Ergocalciferol, (DRISDOL) 1.25 MG (50000 UNIT) CAPS capsule Take 1 capsule (50,000 Units total) by mouth every 7 (seven) days. 12 capsule 3   No current facility-administered medications for this visit.    PHYSICAL EXAMINATION: ECOG PERFORMANCE STATUS: 1 - Symptomatic but completely ambulatory  Vitals:   04/01/24 0915  BP: (!) 140/82  Pulse: (!) 106  Resp: 19  Temp: 97.9 F (36.6 C)  SpO2: 99%   Wt Readings from Last 3 Encounters:  04/01/24 (!) 309 lb (140.2 kg)  03/18/24 (!) 313 lb 6.4 oz (142.2 kg)  03/04/24 (!) 304 lb 14.4 oz (138.3 kg)     GENERAL:alert, no distress and comfortable SKIN: skin color, texture, turgor are normal, no rashes or significant lesions EYES: normal, Conjunctiva are pink and non-injected, sclera clear NECK: supple, thyroid normal size, non-tender, without nodularity LYMPH:  no palpable lymphadenopathy in the cervical, axillary  LUNGS: clear to auscultation and percussion with normal breathing effort HEART: regular rate & rhythm and no murmurs and no lower extremity edema ABDOMEN:abdomen soft, non-tender and normal bowel sounds Musculoskeletal:no cyanosis of  digits and no clubbing  NEURO: alert & oriented x 3 with fluent speech, no focal motor/sensory deficits  Physical Exam    LABORATORY DATA:  I have reviewed the data as listed    Latest Ref Rng & Units 04/01/2024    8:40 AM 03/18/2024    8:37 AM 03/04/2024    9:38 AM  CBC  WBC 4.0 - 10.5 K/uL 3.0  2.7  3.1   Hemoglobin 13.0 - 17.0 g/dL 69.6  29.5  28.4   Hematocrit 39.0 - 52.0 % 35.3  35.3  35.2   Platelets 150 - 400 K/uL 169  156  181         Latest Ref Rng & Units 04/01/2024    8:40 AM 03/18/2024    8:37 AM 03/04/2024    9:38 AM  CMP  Glucose 70 - 99 mg/dL 132  440  102   BUN 6 - 20 mg/dL 12  7  11    Creatinine 0.61 - 1.24 mg/dL 7.25  3.66  4.40   Sodium 135 - 145 mmol/L 137  139  138   Potassium 3.5 - 5.1 mmol/L 4.0  4.3   3.8   Chloride 98 - 111 mmol/L 104  108  105   CO2 22 - 32 mmol/L 26  25  27    Calcium 8.9 - 10.3 mg/dL 9.3  9.0  9.1   Total Protein 6.5 - 8.1 g/dL 7.5  6.9  7.1   Total Bilirubin 0.0 - 1.2 mg/dL 0.5  0.4  0.4   Alkaline Phos 38 - 126 U/L 60  80  66   AST 15 - 41 U/L 23  27  20    ALT 0 - 44 U/L 27  29  23        RADIOGRAPHIC STUDIES: I have personally reviewed the radiological images as listed and agreed with the findings in the report. No results found.    No orders of the defined types were placed in this encounter.  All questions were answered. The patient knows to call the clinic with any problems, questions or concerns. No barriers to learning was detected. The total time spent in the appointment was 30 minutes.     Malachy Mood, MD 04/01/2024

## 2024-04-03 ENCOUNTER — Inpatient Hospital Stay

## 2024-04-03 VITALS — BP 135/90 | HR 86 | Temp 98.2°F | Resp 20

## 2024-04-03 DIAGNOSIS — K760 Fatty (change of) liver, not elsewhere classified: Secondary | ICD-10-CM | POA: Diagnosis not present

## 2024-04-03 DIAGNOSIS — Z452 Encounter for adjustment and management of vascular access device: Secondary | ICD-10-CM | POA: Diagnosis not present

## 2024-04-03 DIAGNOSIS — Z5111 Encounter for antineoplastic chemotherapy: Secondary | ICD-10-CM | POA: Diagnosis not present

## 2024-04-03 DIAGNOSIS — Z95828 Presence of other vascular implants and grafts: Secondary | ICD-10-CM

## 2024-04-03 DIAGNOSIS — C187 Malignant neoplasm of sigmoid colon: Secondary | ICD-10-CM | POA: Diagnosis not present

## 2024-04-03 DIAGNOSIS — E559 Vitamin D deficiency, unspecified: Secondary | ICD-10-CM | POA: Diagnosis not present

## 2024-04-03 MED ORDER — SODIUM CHLORIDE 0.9% FLUSH
10.0000 mL | INTRAVENOUS | Status: DC | PRN
  Administered 2024-04-03: 10 mL

## 2024-04-03 MED ORDER — HEPARIN SOD (PORK) LOCK FLUSH 100 UNIT/ML IV SOLN
500.0000 [IU] | Freq: Once | INTRAVENOUS | Status: AC | PRN
  Administered 2024-04-03: 500 [IU]

## 2024-04-13 NOTE — Progress Notes (Unsigned)
 BH MD/PA/NP OP Progress Note  04/16/2024 10:01 AM Cory Gomez  MRN:  [id number]  Visit Diagnosis:    ICD-10-CM   1. Major depressive disorder, recurrent episode, mild (HCC)  F33.0 buPROPion  (WELLBUTRIN  XL) 150 MG 24 hr tablet      [name] is a 43 y.o. male with a history of MDD, GAD, ADHD, binge eating disorder, colon cancer stage 3b in treatment, and Vit D deficiency who presented to [facility] at [facility] for initial evaluation on 01/15/2023.  At initial evaluation patient reported neurovegetative symptoms of depression including low mood, anhedonia, amotivation, worthlessness, insomnia, increased appetite, and occasional passive SI.  He denied any intent or plan.  Patient was able to complete a safety plan and lists his mother as a good support.  Patient furthermore endorsed symptoms of anxiety including feeling nervous or on edge, being unable to stop or control his worrying, difficulty relaxing, increased irritability, restlessness, and fears that something awful might happen.  Psychosocially patient has increased stress from financial struggles, difficulty at work, and interpersonal struggles in his marriage.  Patient meets criteria for MDD and generalized anxiety disorder with further clarity needed to rule out ADHD.  He was referred for neuropsych testing.  Of note due to patient's sleep troubles and body habitus there is some concern for sleep apnea and he was referred to a sleep specialist.  Cory Gomez presents for follow-up evaluation. Today, 04/16/24, patient reports that his mood and anxiety has been well controlled in the interim. He completed his last chemotherapy treatment yesterday.  While there is still the somatic fatigue related to this mood wise he is improved and hopeful.  Patient notes that he has not needed to use the Lunesta  or propranolol  much in the interim.  He is still taking the Wellbutrin  and Cymbalta   regularly with good effect.  He denies any adverse side effects from either medication.  We will continue on his current regimen and follow up in 6 weeks.  Psychotherapeutic interventions were used during today's session. From 9:35 AM to 9:57 AM. Therapeutic interventions included empathic listening, supportive therapy, cognitive and behavioral therapy, motivational interviewing. Used supportive interviewing techniques to provide emotional validation. Worked on cognitive reframing techniques and recommendations made for behavioral activation.  Improvement was evidenced by patient's participation and identified commitment to therapy goals.    Plan: - Continue Cymbalta  to 60 mg BID - Continue Wellbutrin  XL 150 mg every day - Conitnue propranolol  10 mg BID prn for anxiety - Continue Lunesta  2 mg QHS as needed for insomnia - Neuropsych referral - Sleep specialist appointment delayed until cancer piece is resolved  - Continue therapy with Cory Gomez twice a month - Going to support groups for people with cancer - Crisis resources reviewed - Follow up in 1 month  Chief Complaint:  Chief Complaint  Patient presents with   Follow-up   HPI: Cory Gomez presents reporting that he is doing fairly well. Yesterday was his last treatment and he will get the pump removed tomorrow. In June he will have a scan and blood draw to check on cancer symptoms, with repeat check ups every 3 months for the next couple years. If everything looks good after that he would be declared cancer free.  He is still dealing with fatigue, but it is expected to gradually approve over the next few month. His oncologist thinks he will be able to go back to 70% of his baseline in July.  Prior to that he is still planning to apply for jobs more in the work from home realm. 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   Discussed letting things go that are out of our control. He has  struggled with that in the past, though is able to identify improvement in that realm. For instance with work policies that really annoyed him but did not directly impact him. Another example is with his cancer diagnosis and pushing him to the idea that everything is hopeless going forward.   Still finding the Cymbalta  and Wellbutrin  helpful. He has not needed to use the Lunesta  or propranolol  much in the interim. Cory Gomez was using the propranolol  prior to the treatments, and is not sure he will need much now that he is done with treatment.   Past Psychiatric History: First episode of depression was in early elementary school after his parents divorced. He was in counseling and prescribed bupropion  then. Later, as an adult, he was briefly on fluoxetine with no clear benefit. While in school he was diagnosed with having ADHD inattentive type and was on Ritalin. He had seen Dr. Daryel Ensign from 2020-2022. He has no hx of inpatient psychiatric admissions, no hx of mania or overt psychosis. Denies any prior suicide attempts.   Has tried Adderall , Vyvanse , Ritalin, bupropion , Prozac, Zoloft , Cymbalta , trazodone , Zolpidem , topirimate, Lunesta    He does not abuse alcohol or street drugs.  Past Medical History:  Past Medical History:  Diagnosis Date   ADHD (attention deficit hyperactivity disorder)    Annual physical exam 09/09/2019   Anxiety and depression    Cancer (HCC)    Colon cancer (HCC)    COVID-19    12/28/19   Depression, recurrent (HCC) 09/09/2019   Headache 12/31/2021   History of kidney stones    Hypertriglyceridemia 07/17/2021   Insomnia    Inverted nipple 09/16/2019   Kidney stone    Mastoiditis of both sides    Nystagmus 01/22/2022   Obesity (BMI 30-39.9)    Obesity (BMI 30-39.9)    Suicidal ideation 07/14/2021   Vertigo     Past Surgical History:  Procedure Laterality Date   COLON RESECTION  09/25/2023   PORTACATH PLACEMENT Right 10/17/2023   Procedure: INSERTION PORT-A-CATH  WITH Kim Pen GUIDANCE;  Surgeon: Candyce Champagne, MD;  Location: [facility];  Service: General;  Laterality: Right;   PROCTOSCOPY N/A 09/25/2023   Procedure: RIGID PROCTOSCOPY;  Surgeon: Candyce Champagne, MD;  Location: [facility];  Service: General;  Laterality: N/A;    Family History:  Family History  Problem Relation Age of Onset   Diabetes Mother    Hypertension Mother    Hyperlipidemia Mother    Diabetes Mellitus II Mother    Anxiety disorder Mother    Depression Mother    Basal cell carcinoma Mother 42 - 8   Stroke Father    Alcoholism Father    Anxiety disorder Brother    Depression Brother    Hypertension Brother    Hyperlipidemia Brother    Diabetes type II Brother    ADD / ADHD Brother    Squamous cell carcinoma Maternal Grandmother    Dementia Maternal Grandfather    Basal cell carcinoma Maternal Grandfather    Prostate cancer Maternal Grandfather 75   Lung cancer Paternal Grandfather 46       smoked   Stomach cancer Neg Hx    Colon cancer Neg Hx  Esophageal cancer Neg Hx    Pancreatic cancer Neg Hx     Social History:  Social History   Socioeconomic History   Marital status: Married    Spouse name: Not on file   Number of children: Not on file   Years of education: Not on file   Highest education level: Not on file  Occupational History   Not on file  Tobacco Use   Smoking status: Never   Smokeless tobacco: Never  Vaping Use   Vaping status: Never Used  Substance and Sexual Activity   Alcohol use: Never   Drug use: Never   Sexual activity: Yes    Partners: Female  Other Topics Concern   Not on file  Social History Narrative   Married no kids wife is Benjimen Kelley    -wife and mother Ane Banter on [place]      Has siblings    Works Educational psychologist IT [employer]   1 dog    Social Drivers of Corporate investment banker Strain: High Risk (10/11/2023)   Overall Financial Resource Strain (CARDIA)    Difficulty of Paying Living Expenses:  Very hard  Food Insecurity: No Food Insecurity (01/08/2024)   Hunger Vital Sign    Worried About Running Out of Food in the Last Year: Never true    Ran Out of Food in the Last Year: Never true  Transportation Needs: No Transportation Needs (01/08/2024)   PRAPARE - Administrator, Civil Service (Medical): No    Lack of Transportation (Non-Medical): No  Physical Activity: Not on file  Stress: Not on file  Social Connections: Moderately Integrated (01/08/2024)   Social Connection and Isolation Panel [NHANES]    Frequency of Communication with Friends and Family: More than three times a week    Frequency of Social Gatherings with Friends and Family: Three times a week    Attends Religious Services: More than 4 times per year    Active Member of Clubs or Organizations: No    Attends Banker Meetings: Never    Marital Status: Married    Allergies:  Allergies  Allergen Reactions   Ambien  [Zolpidem ] Other (See Comments)    Pt requests we do not give this medication.     Current Medications: Current Outpatient Medications  Medication Sig Dispense Refill   Acetaminophen  (TYLENOL  PO) Take 500 mg by mouth every 6 (six) hours.     buPROPion  (WELLBUTRIN  XL) 150 MG 24 hr tablet Take 1 tablet (150 mg total) by mouth every morning. 30 tablet 2   DULoxetine  (CYMBALTA ) 60 MG capsule Take 1 capsule (60 mg total) by mouth 2 (two) times daily. 60 capsule 2   eszopiclone  (LUNESTA ) 2 MG TABS tablet Take 1 tablet (2 mg total) by mouth at bedtime as needed for sleep. Take immediately before bedtime 30 tablet 1   lidocaine -prilocaine  (EMLA ) cream Apply to affected area once (Patient taking differently: Apply 1 Application topically as needed (for chemo port, apply three hours before treatment).) 30 g 3   ondansetron  (ZOFRAN ) 8 MG tablet Take 1 tablet (8 mg total) by mouth every 8 (eight) hours as needed for nausea or vomiting. 30 tablet 1   prochlorperazine  (COMPAZINE ) 10 MG tablet  Take 1 tablet (10 mg total) by mouth every 6 (six) hours as needed for nausea or vomiting. (Patient taking differently: Take 10 mg by mouth every 6 (six) hours as needed for nausea or vomiting. Have not started) 30  tablet 1   propranolol  (INDERAL ) 10 MG tablet TAKE 1 TABLET(10 MG) BY MOUTH TWICE DAILY AS NEEDED 60 tablet 2   Vitamin D , Ergocalciferol , (DRISDOL ) 1.25 MG (50000 UNIT) CAPS capsule Take 1 capsule (50,000 Units total) by mouth every 7 (seven) days. 12 capsule 3   No current facility-administered medications for this visit.     Psychiatric Specialty Exam: Review of Systems  There were no vitals taken for this visit.There is no height or weight on file to calculate BMI.  General Appearance: Well Groomed  Eye Contact:  Good  Speech:  Clear and Coherent and Normal Rate  Volume:  Normal  Mood:  Euthymic  Affect:  Congruent  Thought Process:  Coherent and Goal Directed  Orientation:  Full (Time, Place, and Person)  Thought Content: Logical and Rumination   Suicidal Thoughts:  No  Homicidal Thoughts:  No  Memory:  NA  Judgement:  Fair  Insight:  Fair  Psychomotor Activity:  Normal  Concentration:  Concentration: Good  Recall:  Good  Fund of Knowledge: Fair  Language: Good  Akathisia:  NA    AIMS (if indicated): not done  Assets:  Passenger transport manager Vocational/Educational  ADL's:  Intact  Cognition: WNL  Sleep:  Good   Metabolic Disorder Labs: Lab Results  Component Value Date   HGBA1C 5.8 (H) 01/08/2024   MPG 119.76 01/08/2024   MPG 116.89 09/25/2023   No results found for: "PROLACTIN" Lab Results  Component Value Date   CHOL 158 01/08/2024   TRIG 363 (H) 01/08/2024   HDL 39 (L) 01/08/2024   CHOLHDL 4.1 01/08/2024   VLDL 73 (H) 01/08/2024   LDLCALC 46 01/08/2024   LDLCALC 117 (H) 05/08/2023   Lab Results  Component Value Date   TSH 1.160 01/08/2024   TSH 3.16 05/08/2023    Therapeutic Level Labs: No  results found for: "LITHIUM" No results found for: "VALPROATE" No results found for: "CBMZ"   Screenings: GAD-7    Flowsheet Row Office Visit from 01/27/2024 in [facility] Visit from 07/18/2023 in [facility] Visit from 05/29/2023 in [facility] Visit from 03/12/2023 in [facility] Visit from 01/15/2023 in [facility]  Total GAD-7 Score 14 13 6 8 18       PHQ2-9    Flowsheet Row Office Visit from 01/27/2024 in [facility] Visit from 07/18/2023 in [facility] [place] [facility] Visit from 05/29/2023 in [facility] [place] [facility] Visit from 03/12/2023 in [facility] [place] Visit from [facility]  PHQ-2 Total Score 3 2 2 2 5   PHQ-9 Total Score 12 7 10 5 19       Flowsheet Row ED to Hosp-Admission (Discharged) from 01/08/2024 in [facility] Admission (Discharged) from 10/17/2023 in [facility] Admission (Discharged) from 09/25/2023 in [facility] [place] [facility]  C-SSRS RISK CATEGORY No Risk No Risk No Risk       Collaboration of Care: Collaboration of Care: Medication Management AEB medication prescription, Other provider involved in patient's care AEB PCP and oncology chart review, and Referral or follow-up with counselor/therapist AEB chart review  Patient/Guardian was advised Release of Information must be obtained prior to any record release  in order to collaborate their care with an outside provider. Patient/Guardian was advised if they have not already done so to contact the registration department to sign all necessary forms in order  for us  to release information regarding their care.   Consent: Patient/Guardian gives verbal consent for treatment and assignment of benefits for services provided during this visit. Patient/Guardian expressed understanding and agreed to proceed.    Yves Herb, MD 04/16/2024, 10:01 AM   Virtual Visit via Video Note  I connected with Carmelita Ching on 04/16/24 at  9:30 AM EDT by a video enabled telemedicine application and verified that I am speaking with the correct person using two identifiers.  Location: Patient: Home Provider: Home Office   I discussed the limitations of evaluation and management by telemedicine and the availability of in person appointments. The patient expressed understanding and agreed to proceed.   I discussed the assessment and treatment plan with the patient. The patient was provided an opportunity to ask questions and all were answered. The patient agreed with the plan and demonstrated an understanding of the instructions.   The patient was advised to call back or seek an in-person evaluation if the symptoms worsen or if the condition fails to improve as anticipated.  I provided 30 minutes of non-face-to-face time during this encounter.   Yves Herb, MD

## 2024-04-14 NOTE — Assessment & Plan Note (Signed)
-  [id number] stage IIIB, MMS, G2 -Presented with perforated diverticulitis in sigmoid colon in July, 2024. -Patient underwent laparoscopic resection of sigmoid colon, path reviewed moderate differentiated adenocarcinoma with 1 positive lymph nodes. -I recommend adjuvant chemotherapy FOLFOX for 6 months if he can tolerate. -Surveillance CT scan on March 25, 2024 was negative for recurrence.

## 2024-04-15 ENCOUNTER — Inpatient Hospital Stay (HOSPITAL_BASED_OUTPATIENT_CLINIC_OR_DEPARTMENT_OTHER): Admitting: Hematology

## 2024-04-15 ENCOUNTER — Encounter: Payer: Self-pay | Admitting: Hematology

## 2024-04-15 ENCOUNTER — Inpatient Hospital Stay

## 2024-04-15 VITALS — BP 154/78 | HR 97 | Temp 97.9°F | Resp 23 | Ht 74.0 in | Wt 313.0 lb

## 2024-04-15 DIAGNOSIS — E559 Vitamin D deficiency, unspecified: Secondary | ICD-10-CM | POA: Diagnosis not present

## 2024-04-15 DIAGNOSIS — C187 Malignant neoplasm of sigmoid colon: Secondary | ICD-10-CM

## 2024-04-15 DIAGNOSIS — K760 Fatty (change of) liver, not elsewhere classified: Secondary | ICD-10-CM | POA: Diagnosis not present

## 2024-04-15 DIAGNOSIS — Z452 Encounter for adjustment and management of vascular access device: Secondary | ICD-10-CM | POA: Diagnosis not present

## 2024-04-15 DIAGNOSIS — Z95828 Presence of other vascular implants and grafts: Secondary | ICD-10-CM

## 2024-04-15 DIAGNOSIS — Z5111 Encounter for antineoplastic chemotherapy: Secondary | ICD-10-CM | POA: Diagnosis not present

## 2024-04-15 LAB — CBC WITH DIFFERENTIAL (CANCER CENTER ONLY)
Abs Immature Granulocytes: 0 10*3/uL (ref 0.00–0.07)
Basophils Absolute: 0 10*3/uL (ref 0.0–0.1)
Basophils Relative: 1 %
Eosinophils Absolute: 0 10*3/uL (ref 0.0–0.5)
Eosinophils Relative: 1 %
HCT: 35.1 % — ABNORMAL LOW (ref 39.0–52.0)
Hemoglobin: 12 g/dL — ABNORMAL LOW (ref 13.0–17.0)
Immature Granulocytes: 0 %
Lymphocytes Relative: 38 %
Lymphs Abs: 1.2 10*3/uL (ref 0.7–4.0)
MCH: 28.8 pg (ref 26.0–34.0)
MCHC: 34.2 g/dL (ref 30.0–36.0)
MCV: 84.2 fL (ref 80.0–100.0)
Monocytes Absolute: 0.5 10*3/uL (ref 0.1–1.0)
Monocytes Relative: 16 %
Neutro Abs: 1.4 10*3/uL — ABNORMAL LOW (ref 1.7–7.7)
Neutrophils Relative %: 44 %
Platelet Count: 152 10*3/uL (ref 150–400)
RBC: 4.17 MIL/uL — ABNORMAL LOW (ref 4.22–5.81)
RDW: 17.2 % — ABNORMAL HIGH (ref 11.5–15.5)
WBC Count: 3.2 10*3/uL — ABNORMAL LOW (ref 4.0–10.5)
nRBC: 0 % (ref 0.0–0.2)

## 2024-04-15 LAB — CEA (ACCESS): CEA (CHCC): <1 ng/mL (ref 0.00–5.00)

## 2024-04-15 LAB — CMP (CANCER CENTER ONLY)
ALT: 32 U/L (ref 0–44)
AST: 28 U/L (ref 15–41)
Albumin: 4.4 g/dL (ref 3.5–5.0)
Alkaline Phosphatase: 68 U/L (ref 38–126)
Anion gap: 4 — ABNORMAL LOW (ref 5–15)
BUN: 10 mg/dL (ref 6–20)
CO2: 28 mmol/L (ref 22–32)
Calcium: 9.3 mg/dL (ref 8.9–10.3)
Chloride: 105 mmol/L (ref 98–111)
Creatinine: 0.78 mg/dL (ref 0.61–1.24)
GFR, Estimated: >60 mL/min (ref >60)
Glucose, Bld: 104 mg/dL — ABNORMAL HIGH (ref 70–99)
Potassium: 4 mmol/L (ref 3.5–5.1)
Sodium: 137 mmol/L (ref 135–145)
Total Bilirubin: 0.5 mg/dL (ref 0.0–1.2)
Total Protein: 7.2 g/dL (ref 6.5–8.1)

## 2024-04-15 MED ORDER — LEUCOVORIN CALCIUM INJECTION 350 MG
400.0000 mg/m2 | Freq: Once | INTRAVENOUS | Status: AC
  Administered 2024-04-15: 1020 mg via INTRAVENOUS
  Filled 2024-04-15: qty 51

## 2024-04-15 MED ORDER — DEXAMETHASONE SODIUM PHOSPHATE 10 MG/ML IJ SOLN
10.0000 mg | Freq: Once | INTRAMUSCULAR | Status: AC
  Administered 2024-04-15: 10 mg via INTRAVENOUS
  Filled 2024-04-15: qty 1

## 2024-04-15 MED ORDER — DEXTROSE 5 % IV SOLN
60.0000 mg/m2 | Freq: Once | INTRAVENOUS | Status: AC
  Administered 2024-04-15: 150 mg via INTRAVENOUS
  Filled 2024-04-15: qty 10

## 2024-04-15 MED ORDER — LORATADINE 10 MG PO TABS
10.0000 mg | ORAL_TABLET | Freq: Once | ORAL | Status: AC
  Administered 2024-04-15: 10 mg via ORAL
  Filled 2024-04-15: qty 1

## 2024-04-15 MED ORDER — SODIUM CHLORIDE 0.9% FLUSH
10.0000 mL | INTRAVENOUS | Status: DC | PRN
  Administered 2024-04-15: 10 mL

## 2024-04-15 MED ORDER — SODIUM CHLORIDE 0.9 % IV SOLN
2400.0000 mg/m2 | INTRAVENOUS | Status: DC
  Administered 2024-04-15: 6100 mg via INTRAVENOUS
  Filled 2024-04-15: qty 122

## 2024-04-15 MED ORDER — DEXTROSE 5 % IV SOLN: INTRAVENOUS | Status: DC

## 2024-04-15 MED ORDER — FLUOROURACIL CHEMO INJECTION 2.5 GM/50ML
400.0000 mg/m2 | Freq: Once | INTRAVENOUS | Status: AC
  Administered 2024-04-15: 1000 mg via INTRAVENOUS
  Filled 2024-04-15: qty 20

## 2024-04-15 MED ORDER — FAMOTIDINE IN NACL 20-0.9 MG/50ML-% IV SOLN
20.0000 mg | Freq: Once | INTRAVENOUS | Status: AC
  Administered 2024-04-15: 20 mg via INTRAVENOUS
  Filled 2024-04-15: qty 50

## 2024-04-15 MED ORDER — PALONOSETRON HCL INJECTION 0.25 MG/5ML
0.2500 mg | Freq: Once | INTRAVENOUS | Status: AC
  Administered 2024-04-15: 0.25 mg via INTRAVENOUS
  Filled 2024-04-15: qty 5

## 2024-04-15 NOTE — Progress Notes (Signed)
 [facility]   Telephone:([phone] Fax:(336) [phone]   Clinic Follow up Note   Patient Care Team: Valli Gaw, MD as PCP - General (Family Medicine) Candyce Champagne, MD as Consulting Physician (General Surgery) Tobin Forts, MD as Consulting Physician (Gastroenterology) Sonja Merlin, MD as Consulting Physician (Medical Oncology) Omega Bible, MD as Consulting Physician (Neurology) Daleen Dubs, DO as Consulting Physician (Otolaryngology)  Date of Service:  04/15/2024  CHIEF COMPLAINT: f/u of colon cancer  CURRENT THERAPY:  Adjuvant FOLFOX  Oncology History   Cancer of sigmoid colon - pT4a, pN1a - s/p roboric LAR resection 09/27/2023 -pT4aN1M0 stage IIIB, MMS, G2 -Presented with perforated diverticulitis in sigmoid colon in July, 2024. -Patient underwent laparoscopic resection of sigmoid colon, path reviewed moderate differentiated adenocarcinoma with 1 positive lymph nodes. -I recommend adjuvant chemotherapy FOLFOX for 6 months if he can tolerate. -Surveillance CT scan on March 25, 2024 was negative for recurrence.  Assessment & Plan Colon cancer Colon cancer under treatment with chemotherapy. Completing the last cycle this week with mild cold intolerance post-treatment. No neuropathy reported. Blood counts adequate for continuation. Previous CT scan and tumor markers normal. Monitoring for recurrence with circulating tumor DNA tests and CT scans every six months for two years, then reassess frequency based on risk reduction. Recurrence risk drops significantly after three years, potentially eliminating further scans. - Complete current chemotherapy cycle. - Order circulating tumor DNA test in two months. - Schedule CT scan in three months. - Schedule colonoscopy in October, one year post-surgery. - Maintain port for lab draws and CT contrast; flush every six to eight weeks. - Advise on influenza vaccination and mask use during winter to prevent  infections. - Encourage physical activity and weight management.  Plan - Lab reviewed, adequate for treatment, will proceed cycle chemosis leucovorin  and 5-FU, oxaliplatin  has been held since last cycle due to mild neuropathy - Lab, flush and follow-up in 8 weeks, including [facility]   SUMMARY OF ONCOLOGIC HISTORY: Oncology History  Cancer of sigmoid colon - pT4a, pN1a - s/p roboric LAR resection 09/27/2023  09/25/2023 Cancer Staging   Staging form: Colon and Rectum, AJCC 8th Edition - Pathologic stage from 09/25/2023: Stage IIIB (pT4a, pN1a, cM0) - Signed by Sharyon Deis, NP on 10/09/2023 Total positive nodes: 1 Histologic grading system: 4 grade system Histologic grade (G): G2 Residual tumor (R): R0 - None   09/25/2023 Surgery   Laparoscopic resection of sigmoid colon  Invasive, moderately differentiated adenocarcinoma of sigmoid colon. Invades into werosal surface. Margins are clear. Negative lymphovascular and perineural invasion. Metastasis to 1/45 lymph nodes.    09/25/2023 Pathology Results   FINAL MICROSCOPIC DIAGNOSIS:  A.   COLON, RESECTION: - Invasive moderately differentiated adenocarcinoma, 7.8 cm involving sigmoid colon - Carcinoma invades into the serosal surface - Resection margins are negative for carcinoma - Negative for lymphovascular or perineural invasion - Metastatic carcinoma to one of forty-five lymph nodes (1/45) - See oncology table B.   COLON, DISTAL MARGIN, EXCISION: - Colonic donut within normal limits, negative for carcinoma  ONCOLOGY TABLE:  COLON AND RECTUM, CARCINOMA:  Resection, Including Transanal Disk Excision of Rectal Neoplasms  Procedure: Resection, rectosigmoid colon Tumor Site: Sigmoid colon Tumor Size: 7.8 cm Macroscopic Tumor Perforation: Not identified  Histologic Type: Adenocarcinoma Histologic Grade: G2: Moderately differentiated Multiple Primary Sites: Not applicable Tumor Extension: Carcinoma invades into the serosal  surface Lymphovascular Invasion: Not identified Perineural Invasion: Not identified Treatment Effect: No known presurgical therapy Margins:  Margin Status for Invasive Carcinoma: All margins negative for invasive carcinoma      Margin Status for Non-Invasive Tumor: All margins negative for high-grade dysplasia / intramucosal           carcinoma and low-grade dysplasia Regional Lymph Nodes:      Number of Lymph Nodes with Tumor: 1      Number of Lymph Nodes Examined: 45 Tumor Deposits: Not identified Distant Metastasis:      Distant Site(s) Involved: Not applicable Pathologic Stage Classification (pTNM, AJCC 8th Edition): pT4a, pN1a Ancillary Studies: MMR / MSI testing will be ordered. Representative Tumor Block: A2 Comments: None (v4.2.0.1)     09/30/2023 Initial Diagnosis   Cancer of sigmoid colon - pT4a, pN1a - s/p roboric LAR resection 09/27/2023   10/08/2023 Imaging   CT Chest with contrast  IMPRESSION: Negative. No evidence of thoracic metastatic disease or other significant abnormality.   10/23/2023 -  Chemotherapy   Patient is on Treatment Plan : COLORECTAL FOLFOX q14d x 6 months      Genetic Testing   [facility] CancerNext-Expanded Panel+RNA was Negative. Report date is 11/15/2023.   The CancerNext-Expanded gene panel offered by [facility] and includes sequencing, rearrangement, and RNA analysis for the following 71 genes: AIP, ALK, APC, ATM, AXIN2, BAP1, BARD1, BMPR1A, BRCA1, BRCA2, BRIP1, CDC73, CDH1, CDK4, CDKN1B, CDKN2A, CHEK2, CTNNA1, DICER1, FH, FLCN, KIF1B, LZTR1, MAX, MEN1, MET, MLH1, MSH2, MSH3, MSH6, MUTYH, NF1, NF2, NTHL1, PALB2, PHOX2B, PMS2, POT1, PRKAR1A, PTCH1, PTEN, RAD51C, RAD51D, RB1, RET, SDHA, SDHAF2, SDHB, SDHC, SDHD, SMAD4, SMARCA4, SMARCB1, SMARCE1, STK11, SUFU, TMEM127, TP53, TSC1, TSC2, and VHL (sequencing and deletion/duplication); EGFR, EGLN1, HOXB13, KIT, MITF, PDGFRA, POLD1, and POLE (sequencing only); EPCAM and GREM1 (deletion/duplication  only).        Discussed the use of AI scribe software for clinical note transcription with the patient, who gave verbal consent to proceed.  History of Present Illness A 43 year old patient with a history of colon cancer presents for a follow-up visit. He is at the end of his chemotherapy cycle and reports no significant problems with his last treatment. He attributes the tolerability of his treatment to the lowered dosage and his young age. He denies experiencing neuropathy, such as numbness or tingling in his fingers. He does report a temporary intolerance to cold drinks following treatment, which resolves about five days post-treatment. He denies any numbness in his feet. The patient has been managing his nausea well and has not required additional medication refills. He has been staying active, with encouragement from his wife, and plans to return to work in the summer. He expresses a desire to lose some weight gained during his treatment.     All other systems were reviewed with the patient and are negative.  MEDICAL HISTORY:  Past Medical History:  Diagnosis Date   ADHD (attention deficit hyperactivity disorder)    Annual physical exam 09/09/2019   Anxiety and depression    Cancer (HCC)    Colon cancer (HCC)    COVID-19    12/28/19   Depression, recurrent (HCC) 09/09/2019   Headache 12/31/2021   History of kidney stones    Hypertriglyceridemia 07/17/2021   Insomnia    Inverted nipple 09/16/2019   Kidney stone    Mastoiditis of both sides    Nystagmus 01/22/2022   Obesity (BMI 30-39.9)    Obesity (BMI 30-39.9)    Suicidal ideation 07/14/2021   Vertigo     SURGICAL HISTORY: Past Surgical History:  Procedure Laterality Date  COLON RESECTION  09/25/2023   PORTACATH PLACEMENT Right 10/17/2023   Procedure: INSERTION PORT-A-CATH WITH Kim Pen GUIDANCE;  Surgeon: Candyce Champagne, MD;  Location: [facility];  Service: General;  Laterality: Right;   PROCTOSCOPY  N/A 09/25/2023   Procedure: RIGID PROCTOSCOPY;  Surgeon: Candyce Champagne, MD;  Location: [facility];  Service: General;  Laterality: N/A;    I have reviewed the social history and family history with the patient and they are unchanged from previous note.  ALLERGIES:  is allergic to ambien  [zolpidem ].  MEDICATIONS:  Current Outpatient Medications  Medication Sig Dispense Refill   Acetaminophen  (TYLENOL  PO) Take 500 mg by mouth every 6 (six) hours.     buPROPion  (WELLBUTRIN  XL) 150 MG 24 hr tablet Take 1 tablet (150 mg total) by mouth every morning. 30 tablet 2   DULoxetine  (CYMBALTA ) 60 MG capsule Take 1 capsule (60 mg total) by mouth 2 (two) times daily. 60 capsule 2   eszopiclone  (LUNESTA ) 2 MG TABS tablet Take 1 tablet (2 mg total) by mouth at bedtime as needed for sleep. Take immediately before bedtime 30 tablet 1   lidocaine -prilocaine  (EMLA ) cream Apply to affected area once (Patient taking differently: Apply 1 Application topically as needed (for chemo port, apply three hours before treatment).) 30 g 3   ondansetron  (ZOFRAN ) 8 MG tablet Take 1 tablet (8 mg total) by mouth every 8 (eight) hours as needed for nausea or vomiting. 30 tablet 1   prochlorperazine  (COMPAZINE ) 10 MG tablet Take 1 tablet (10 mg total) by mouth every 6 (six) hours as needed for nausea or vomiting. (Patient taking differently: Take 10 mg by mouth every 6 (six) hours as needed for nausea or vomiting. Have not started) 30 tablet 1   propranolol  (INDERAL ) 10 MG tablet TAKE 1 TABLET(10 MG) BY MOUTH TWICE DAILY AS NEEDED 60 tablet 2   Vitamin D , Ergocalciferol , (DRISDOL ) 1.25 MG (50000 UNIT) CAPS capsule Take 1 capsule (50,000 Units total) by mouth every 7 (seven) days. 12 capsule 3   No current facility-administered medications for this visit.   Facility-Administered Medications Ordered in Other Visits  Medication Dose Route Frequency Provider Last Rate Last Admin   dextrose  5 % solution   Intravenous Continuous Sonja ,  MD   Stopped at 04/15/24 1702   fluorouracil  (ADRUCIL ) 6,100 mg in sodium chloride  0.9 % 128 mL chemo infusion  2,400 mg/m2 (Treatment Plan Recorded) Intravenous 1 day or 1 dose Sonja , MD   Infusion Verify at 04/15/24 1723    PHYSICAL EXAMINATION: ECOG PERFORMANCE STATUS: 1 - Symptomatic but completely ambulatory  Vitals:   04/15/24 1149  BP: (!) 154/78  Pulse: 97  Resp: (!) 23  Temp: 97.9 F (36.6 C)  SpO2: 98%   Wt Readings from Last 3 Encounters:  04/15/24 (!) 313 lb (142 kg)  04/01/24 (!) 309 lb (140.2 kg)  03/18/24 (!) 313 lb 6.4 oz (142.2 kg)     GENERAL:alert, no distress and comfortable SKIN: skin color, texture, turgor are normal, no rashes or significant lesions EYES: normal, Conjunctiva are pink and non-injected, sclera clear NECK: supple, thyroid  normal size, non-tender, without nodularity LYMPH:  no palpable lymphadenopathy in the cervical, axillary  LUNGS: clear to auscultation and percussion with normal breathing effort HEART: regular rate & rhythm and no murmurs and no lower extremity edema ABDOMEN:abdomen soft, non-tender and normal bowel sounds Musculoskeletal:no cyanosis of digits and no clubbing  NEURO: alert & oriented x 3 with fluent speech, no focal motor/sensory deficits  Physical Exam    LABORATORY DATA:  I have reviewed the data as listed    Latest Ref Rng & Units 04/15/2024   11:25 AM 04/01/2024    8:40 AM 03/18/2024    8:37 AM  CBC  WBC 4.0 - 10.5 K/uL 3.2  3.0  2.7   Hemoglobin 13.0 - 17.0 g/dL 16.1  09.6  04.5   Hematocrit 39.0 - 52.0 % 35.1  35.3  35.3   Platelets 150 - 400 K/uL 152  169  156         Latest Ref Rng & Units 04/15/2024   11:25 AM 04/01/2024    8:40 AM 03/18/2024    8:37 AM  CMP  Glucose 70 - 99 mg/dL 409  811  914   BUN 6 - 20 mg/dL 10  12  7    Creatinine 0.61 - 1.24 mg/dL 7.82  9.56  2.13   Sodium 135 - 145 mmol/L 137  137  139   Potassium 3.5 - 5.1 mmol/L 4.0  4.0  4.3   Chloride 98 - 111 mmol/L 105  104  108    CO2 22 - 32 mmol/L 28  26  25    Calcium  8.9 - 10.3 mg/dL 9.3  9.3  9.0   Total Protein 6.5 - 8.1 g/dL 7.2  7.5  6.9   Total Bilirubin 0.0 - 1.2 mg/dL 0.5  0.5  0.4   Alkaline Phos 38 - 126 U/L 68  60  80   AST 15 - 41 U/L 28  23  27    ALT 0 - 44 U/L 32  27  29       RADIOGRAPHIC STUDIES: I have personally reviewed the radiological images as listed and agreed with the findings in the report. No results found.    No orders of the defined types were placed in this encounter.  All questions were answered. The patient knows to call the clinic with any problems, questions or concerns. No barriers to learning was detected. The total time spent in the appointment was 25 minutes.     Sonja Mountrail, MD 04/15/2024

## 2024-04-16 ENCOUNTER — Encounter (HOSPITAL_COMMUNITY): Payer: Self-pay | Admitting: Psychiatry

## 2024-04-16 ENCOUNTER — Other Ambulatory Visit: Payer: Self-pay

## 2024-04-16 ENCOUNTER — Telehealth (HOSPITAL_COMMUNITY): Admitting: Psychiatry

## 2024-04-16 DIAGNOSIS — F330 Major depressive disorder, recurrent, mild: Secondary | ICD-10-CM

## 2024-04-16 MED ORDER — BUPROPION HCL ER (XL) 150 MG PO TB24: 150.0000 mg | ORAL_TABLET | ORAL | 2 refills | Status: DC

## 2024-04-17 ENCOUNTER — Inpatient Hospital Stay

## 2024-04-17 VITALS — BP 157/87 | HR 94 | Resp 20

## 2024-04-17 DIAGNOSIS — Z5111 Encounter for antineoplastic chemotherapy: Secondary | ICD-10-CM | POA: Diagnosis not present

## 2024-04-17 DIAGNOSIS — C187 Malignant neoplasm of sigmoid colon: Secondary | ICD-10-CM

## 2024-04-17 DIAGNOSIS — Z452 Encounter for adjustment and management of vascular access device: Secondary | ICD-10-CM | POA: Diagnosis not present

## 2024-04-17 DIAGNOSIS — K760 Fatty (change of) liver, not elsewhere classified: Secondary | ICD-10-CM | POA: Diagnosis not present

## 2024-04-17 DIAGNOSIS — E559 Vitamin D deficiency, unspecified: Secondary | ICD-10-CM | POA: Diagnosis not present

## 2024-04-17 MED ORDER — SODIUM CHLORIDE 0.9% FLUSH
10.0000 mL | INTRAVENOUS | Status: DC | PRN
  Administered 2024-04-17: 10 mL

## 2024-04-17 MED ORDER — HEPARIN SOD (PORK) LOCK FLUSH 100 UNIT/ML IV SOLN
500.0000 [IU] | Freq: Once | INTRAVENOUS | Status: AC | PRN
  Administered 2024-04-17: 500 [IU]

## 2024-04-20 ENCOUNTER — Ambulatory Visit (INDEPENDENT_AMBULATORY_CARE_PROVIDER_SITE_OTHER): Admitting: Clinical

## 2024-04-20 ENCOUNTER — Encounter (HOSPITAL_COMMUNITY): Payer: Self-pay | Admitting: Clinical

## 2024-04-20 DIAGNOSIS — F330 Major depressive disorder, recurrent, mild: Secondary | ICD-10-CM

## 2024-04-20 DIAGNOSIS — F411 Generalized anxiety disorder: Secondary | ICD-10-CM | POA: Diagnosis not present

## 2024-04-20 DIAGNOSIS — Z630 Problems in relationship with spouse or partner: Secondary | ICD-10-CM | POA: Diagnosis not present

## 2024-04-20 NOTE — Progress Notes (Signed)
 THERAPIST PROGRESS NOTE  Session Time:  9:04am-10:00am   Session #15  Participation Level: Active  Behavioral Response: Casual Alert Euthymic   Type of Therapy: Individual Therapy  Treatment Goals Addressed:  New treatment goals established, current goals reviewed:  STG: Cory "[name]" will reduce frequency of avoidant behaviors by 50% as evidenced by self-report in therapy sessions  LTG: Learn a variety of coping skills and demonstrate the ability to use them to decrease feelings of sadness, anger, and fear and increase feelings of happiness, peace, and powerfulness AEB gauging those emotions on 1-10 scale.  LTG: Learn breathing techniques and grounding techniques at an age-appropriate level and demonstrate mastery in session then report independent use of these skills out of session.  LTG: Explore personal core beliefs, rules and assumptions, and cognitive distortions through therapist using Cognitive Behavioral Therapy; learn how to develop replacement thoughts and challenge unhelpful thoughts. STG: Score less than 9 on the PHQ-9 and less than 5 on the GAD-7 as evidenced by intermittent administration of the questionnaires to determine progress in managing depression and anxiety.  LTG: Learn and practice communication techniques such as active listening, "I" statements, open-ended questions, reflective listening, assertiveness, fair fighting rules, initiating conversations, and more as necessary and taught in session.  LTG: Learn about boundary styles and types, how to implement them, and how to enforce them so that feels more empowered and content with being able to maintain more helpful, appropriate boundaries in the future for a more balanced result. LTG: Work to Arts development officer from models like CBT, Stages of Change, DBT, shame resilience theory, ACT, SFBT, MI, trauma-informed therapy and others to be able to manage mental health symptoms, AEB practicing out of session and reporting back.   STG: Improve self-esteem by engaging in daily affirmations, developing new skills, gratitude journaling, use of SMART goals, increased assertiveness, challenging negative beliefs, and focusing on what patient can control   LTG: Increase coping skills to manage depression/anxiety/anger and improve ability to perform daily activities as evidenced by fewer depressive episodes and/or anger outbursts and/or anxious days per self-report and collateral information.   ProgressTowards Goals: Progressing  Interventions: DBT, Assertiveness Training, Supportive, and Other: treatment planning    Summary: Cory Gomez is a 43 y.o. male who presents with MDD/severe, GAD, insomnia, and relationship problems with his wife. He presented oriented x5 and stated he was feeling "good, excited."  CSW evaluated patient's medication compliance, use of coping tools, and self-care, as applicable.  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  His doctor told him it will take 5 days for the last treatment to leave his system, 2-3 months to be ready to return to work, and 2 years for his immune system to rebuild itself to its former level of functioning.  He explained how he has managed to get through the past months of treatment, did a lot of cognitive reframing, was provided positive strokes for this.  He is somewhat sad that he will not see the helpful people at the [facility] anymore, which was normalized.  Patient disclosed that his wife has lost/quit another job and he is fearful that she is "returning to her old ways" now that his treatment has ended.  He has not asked her about this or the other jobs, stating that he "cannot control  her."  CSW provided perspective on the normalcy of asking partners about "what happened," encouraged him  to come up with "I" statements to do this, postulated that it might bring a new level of intimacy to their relationship if he showed that he can talk about such matters.  CSW provided sample words, such as "I wonder if you would like to or be willing to tell me what happened with this job?"  CSW provided a description of the "compliment sandwich" and how that can be so helpful to giving feedback without it being so harsh, especially with removal of the word "but."  He stated that multiple friends have told him he should leave her.  CSW guided him to discuss the ways in which these friends have violated boundaries and how he could respond to them.  CSW provided a handout about boundaries and reviewed it, helping him to identify the healthy boundaries as the goal.  CSW provided a DBT handout on the ACCEPTS skill and recommended that he do some of these activities to reduce the intensity of his emotions while he is waiting for permission to go back to work.  He verbalized understanding of both.  Suicidal/Homicidal: No without intent/plan  Therapist Response: Patient is progressing AEB engaging in scheduled therapy session.  Throughout the session, CSW gave patient the opportunity to explore thoughts and feelings associated with current life situations and past/present stressors.   CSW challenged patient gently and appropriately to consider different ways of looking at reported issues. CSW encouraged patient's expression of feelings and validated these using empathy, active listening, open body language, and unconditional positive regard.   CSW spoke with him about the format of upcoming appointments.    Plan/Recommendations:  Return to therapy as scheduled on 5/12, continue to engage in self care behaviors, continue to work on reducing sugar in diet, try out the boundaries with his wife and friends, try the ACCEPTS skills and come back to next session ready to talk about experience.  Diagnosis:  Encounter  Diagnoses  Name Primary?   Major depressive disorder, recurrent episode, mild (HCC) Yes   GAD (generalized anxiety disorder)    Relationship problem between partners    Collaboration of Care: Psychiatrist AEB - psychiatrist and therapist both have access to notes in Epic  Patient/Guardian was advised Release of Information must be obtained prior to any record release in order to collaborate their care with an outside provider. Patient/Guardian was advised if they have not already done so to contact the registration department to sign all necessary forms in order for us  to release information regarding their care.   Consent: Patient/Guardian gives verbal consent for treatment and assignment of benefits for services provided during this visit. Patient/Guardian expressed understanding and agreed to proceed.   Ancel Kass, LCSW 04/20/2024

## 2024-04-24 ENCOUNTER — Other Ambulatory Visit: Payer: Self-pay

## 2024-04-24 DIAGNOSIS — C187 Malignant neoplasm of sigmoid colon: Secondary | ICD-10-CM

## 2024-04-24 NOTE — Progress Notes (Signed)
 As per Dr. Maryalice Smaller, placed order successfully in portal for [facility]. Attached all records needed sent kit to the lab to be drawn on 06/10/2024.

## 2024-05-04 ENCOUNTER — Encounter (HOSPITAL_COMMUNITY): Payer: Self-pay | Admitting: Clinical

## 2024-05-04 ENCOUNTER — Ambulatory Visit (INDEPENDENT_AMBULATORY_CARE_PROVIDER_SITE_OTHER): Admitting: Clinical

## 2024-05-04 DIAGNOSIS — F330 Major depressive disorder, recurrent, mild: Secondary | ICD-10-CM

## 2024-05-04 DIAGNOSIS — Z630 Problems in relationship with spouse or partner: Secondary | ICD-10-CM

## 2024-05-04 DIAGNOSIS — F411 Generalized anxiety disorder: Secondary | ICD-10-CM | POA: Diagnosis not present

## 2024-05-04 NOTE — Progress Notes (Signed)
 THERAPIST PROGRESS NOTE  Session Time:  8:10am-9:04am   Session #16  Participation Level: Active  Behavioral Response: Casual Alert Euthymic   Type of Therapy: Individual Therapy  Treatment Goals Addressed:  STG: Robt "[name]" will reduce frequency of avoidant behaviors by 50% as evidenced by self-report in therapy sessions  LTG: Learn a variety of coping skills and demonstrate the ability to use them to decrease feelings of sadness, anger, and fear and increase feelings of happiness, peace, and powerfulness AEB gauging those emotions on 1-10 scale.  LTG: Learn breathing techniques and grounding techniques at an age-appropriate level and demonstrate mastery in session then report independent use of these skills out of session.  LTG: Explore personal core beliefs, rules and assumptions, and cognitive distortions through therapist using Cognitive Behavioral Therapy; learn how to develop replacement thoughts and challenge unhelpful thoughts. STG: Score less than 9 on the PHQ-9 and less than 5 on the GAD-7 as evidenced by intermittent administration of the questionnaires to determine progress in managing depression and anxiety.  LTG: Learn and practice communication techniques such as active listening, "I" statements, open-ended questions, reflective listening, assertiveness, fair fighting rules, initiating conversations, and more as necessary and taught in session.  LTG: Learn about boundary styles and types, how to implement them, and how to enforce them so that feels more empowered and content with being able to maintain more helpful, appropriate boundaries in the future for a more balanced result. LTG: Work to Arts development officer from models like CBT, Stages of Change, DBT, shame resilience theory, ACT, SFBT, MI, trauma-informed therapy and others to be able to manage mental health symptoms, AEB practicing out of session and reporting back.  STG: Improve self-esteem by engaging in daily  affirmations, developing new skills, gratitude journaling, use of SMART goals, increased assertiveness, challenging negative beliefs, and focusing on what patient can control   LTG: Increase coping skills to manage depression/anxiety/anger and improve ability to perform daily activities as evidenced by fewer depressive episodes and/or anger outbursts and/or anxious days per self-report and collateral information.   ProgressTowards Goals: Progressing  Interventions: DBT, Assertiveness Training, Supportive, and Other: treatment planning   Summary: Cory Gomez is a 43 y.o. male who presents with MDD/severe, GAD, insomnia, relationship problems with his wife due to her constantly leaving jobs, and recent completion of cancer treatments.  He presented oriented x5 and stated he was feeling "worried."  CSW evaluated patient's medication compliance, use of coping tools, and self-care, as applicable.  He provided an update on various aspects of his life that are normally discussed in therapy, including new symptoms, job ideas, and weight loss goals.  He is a little worried due to experiencing tingling and numbness, will see doctor next week for a scan to determine if he is having blood clots.  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  Because of safety concerns, he thinks that 70-80% functioning is likely insufficient to return to Holiday representative job.  He really enjoys IT work most, so is probably going to focus on searching for jobs in that area.  We brainstormed the possibility of finding such a job with current employer or past employers.  We also reviewed methods he is using to maintain detachment with  his wife who started another new job today.  Patient processed how his outlook on life and his  generalized anxiety have changed significantly during cancer treatments.  CSW introduced him to the idea of stoicism as a means of putting a term to his "it is what it is" attitude.  He disclosed a huge goal for weight loss, stated he is 6'2" and over 300 pounds, wants to get to 200 pounds.  His plan is to continue eating healthier and join a gym, both of which were explored as contributors to weight loss, with an emphasis on their limitations.  Finally, patient's upbringing with prejudice was explored and discussed.  CSW explained how there are often 3 sides to every story or idea, using DBT's "Middle Road" concept and "Agustina Aldrich Mind" concept and examples.    Suicidal/Homicidal: No without intent/plan  Therapist Response: Patient is progressing AEB engaging in scheduled therapy session.  Throughout the session, CSW gave patient the opportunity to explore thoughts and feelings associated with current life situations and past/present stressors.   CSW challenged patient gently and appropriately to consider different ways of looking at reported issues. CSW encouraged patient's expression of feelings and validated these using empathy, active listening, open body language, and unconditional positive regard.   CSW reminded him of the format of upcoming appointments.    Plan/Recommendations:  Return to therapy as scheduled on 6/10, continue to engage in self care especially that related to nutrition and fitness, continue to work on reducing sugar in diet, follow-up at next session about how the ACCEPTS skills were/were not helpful  Diagnosis:  Encounter Diagnoses  Name Primary?   Major depressive disorder, recurrent episode, mild (HCC) Yes   GAD (generalized anxiety disorder)    Relationship problem between partners     Collaboration of Care: Psychiatrist AEB - psychiatrist and therapist both have access to notes in Epic  Patient/Guardian was advised Release of Information must be obtained prior to any record  release in order to collaborate their care with an outside provider. Patient/Guardian was advised if they have not already done so to contact the registration department to sign all necessary forms in order for us  to release information regarding their care.   Consent: Patient/Guardian gives verbal consent for treatment and assignment of benefits for services provided during this visit. Patient/Guardian expressed understanding and agreed to proceed.   Ancel Kass, LCSW 05/04/2024

## 2024-05-20 ENCOUNTER — Ambulatory Visit (HOSPITAL_COMMUNITY): Admitting: Clinical

## 2024-05-27 NOTE — Progress Notes (Unsigned)
 BH MD/PA/NP OP Progress Note  05/28/2024 1:47 PM Cory Gomez  MRN:  [id number]  Visit Diagnosis:    ICD-10-CM   1. Major depressive disorder, recurrent episode, mild (HCC)  F33.0 DULoxetine  (CYMBALTA ) 60 MG capsule    buPROPion  (WELLBUTRIN  XL) 150 MG 24 hr tablet    2. GAD (generalized anxiety disorder)  F41.1 DULoxetine  (CYMBALTA ) 60 MG capsule     [name] is a 43 y.o. male with a history of MDD, GAD, ADHD, binge eating disorder, colon cancer stage 3b in treatment, and Vit D deficiency who presented to [facility] at [facility] for initial evaluation on 01/15/2023.  At initial evaluation patient reported neurovegetative symptoms of depression including low mood, anhedonia, amotivation, worthlessness, insomnia, increased appetite, and occasional passive SI.  He denied any intent or plan.  Patient was able to complete a safety plan and lists his mother as a good support.  Patient furthermore endorsed symptoms of anxiety including feeling nervous or on edge, being unable to stop or control his worrying, difficulty relaxing, increased irritability, restlessness, and fears that something awful might happen.  Psychosocially patient has increased stress from financial struggles, difficulty at work, and interpersonal struggles in his marriage.  Patient meets criteria for MDD and generalized anxiety disorder with further clarity needed to rule out ADHD.  He was referred for neuropsych testing.  Of note due to patient's sleep troubles and body habitus there is some concern for sleep apnea and he was referred to a sleep specialist.  Cory Gomez presents for follow-up evaluation. Today, 05/28/24, patient reports that his mood has been stable and her anxiety has been manageable with coping skills.  He has had some increased worry lately related to the upcoming screening to confirm that the cancer has been fully treated, as well as whether he will be  able to return to work in a month.  Continues to take the medication consistently reporting good effect and no adverse side effects.  He has not needed to use the as needed propranolol  or Lunesta  recently.  Would be appropriate to continue on current regimen.  Of note patient currently does not have insurance.  This being the case we will space out appointments a bit further and patient will reach out in the interim if needed.  We also have sent medication to a different pharmacy that was more financially viable for the patient.  Psychotherapeutic interventions were used during today's session. From 11:32 AM to 11:59 AM. Therapeutic interventions included empathic listening, supportive therapy, cognitive and behavioral therapy, motivational interviewing. Used supportive interviewing techniques to provide emotional validation. Worked on cognitive reframing techniques and recommendations made for behavioral activation.  Improvement was evidenced by patient's participation and identified commitment to therapy goals.    Plan: - Continue Cymbalta  to 60 mg BID - Continue Wellbutrin  XL 150 mg every day - Conitnue propranolol  10 mg BID prn for anxiety - Continue Lunesta  2 mg QHS as needed for insomnia - Neuropsych referral - Sleep specialist appointment delayed until cancer piece is resolved  - Continue therapy with Cory Gomez twice a month - Going to support groups for people with cancer - Crisis resources reviewed - Follow up in 2 months  Chief Complaint:  Chief Complaint  Patient presents with   Follow-up   HPI: Cory Gomez presents reporting that the past month has not been to bad. Just been taking his time getting over the chemotherapy he finished around 6 weeks ago.  His fatigue is getting better the further out he gets from treatment. He is going to try to go back to work in July. He goes to his PCP and oncologist later this month to repeat tests to rule out the cancer comes back. Cory Gomez has  some anxiety that the cancer will come back. He has used his coping skills and grounding techniques to stop his ruminating thoughts about maybe it will come back.   Has been struggling with the prolonged isolation and being stuck at home. Feels like he is not accomplishing anything or doing anything worthwhile. Patient describes feeling a lack of purpose. Has tried to find hobbies, but financial limitations have made it tough.  He is hopeful to restart work in July, which would help resolve some of this worry.  Cory Gomez has concerns though that he will not be cleared for heavy duty which will make him unable to return to his former line of work.  Recommended that he discuss limitations with his oncologist and also consider alternative lines of work if needed.  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 Used supportive interviewing techniques to provide emotional validation.   Patient is sleeping well throughout the night reporting that he gets at least 8 hours.  He also does take his medications consistently and things are working well.  He has not needed to use the propranolol  in several weeks and Lunesta  in an extended period.  Of note patient no longer has insurance symptoms because prohibitive from his pharmacy.  We looked into alternatives and found a more affordable option from which patient could get it.  We also discussed spacing out the appointments of it further and patient looking into Medicaid options due to his current lack of insurance.  Patient will still reach out in the interim if any concerns arise.  Past Psychiatric History: First episode of depression was in early elementary school after his parents divorced. He was in counseling and prescribed bupropion  then. Later, as an adult, he was briefly on fluoxetine with no clear benefit. While in school he was diagnosed with having ADHD inattentive type and was on Ritalin. He had seen Dr. Daryel Ensign from  2020-2022. He has no hx of inpatient psychiatric admissions, no hx of mania or overt psychosis. Denies any prior suicide attempts.   Has tried Adderall , Vyvanse , Ritalin, bupropion , Prozac, Zoloft , Cymbalta , trazodone , Zolpidem , topirimate, Lunesta    He does not abuse alcohol or street drugs.  Past Medical History:  Past Medical History:  Diagnosis Date   ADHD (attention deficit hyperactivity disorder)    Annual physical exam 09/09/2019   Anxiety and depression    Cancer (HCC)    Colon cancer (HCC)    COVID-19    12/28/19   Depression, recurrent (HCC) 09/09/2019   Headache 12/31/2021   History of kidney stones    Hypertriglyceridemia 07/17/2021   Insomnia    Inverted nipple 09/16/2019   Kidney stone    Mastoiditis of both sides    Nystagmus 01/22/2022   Obesity (BMI 30-39.9)    Obesity (BMI 30-39.9)    Suicidal ideation 07/14/2021   Vertigo     Past Surgical History:  Procedure Laterality Date   COLON RESECTION  09/25/2023   PORTACATH PLACEMENT Right 10/17/2023   Procedure: INSERTION PORT-A-CATH WITH Kim Pen GUIDANCE;  Surgeon: Candyce Champagne, MD;  Location: [facility];  Service: General;  Laterality: Right;   PROCTOSCOPY N/A 09/25/2023   Procedure:  RIGID PROCTOSCOPY;  Surgeon: Candyce Champagne, MD;  Location: [facility];  Service: General;  Laterality: N/A;    Family History:  Family History  Problem Relation Age of Onset   Diabetes Mother    Hypertension Mother    Hyperlipidemia Mother    Diabetes Mellitus II Mother    Anxiety disorder Mother    Depression Mother    Basal cell carcinoma Mother 40 - 61   Stroke Father    Alcoholism Father    Anxiety disorder Brother    Depression Brother    Hypertension Brother    Hyperlipidemia Brother    Diabetes type II Brother    ADD / ADHD Brother    Squamous cell carcinoma Maternal Grandmother    Dementia Maternal Grandfather    Basal cell carcinoma Maternal Grandfather    Prostate cancer Maternal Grandfather  27   Lung cancer Paternal Grandfather 68       smoked   Stomach cancer Neg Hx    Colon cancer Neg Hx    Esophageal cancer Neg Hx    Pancreatic cancer Neg Hx     Social History:  Social History   Socioeconomic History   Marital status: Married    Spouse name: Not on file   Number of children: Not on file   Years of education: Not on file   Highest education level: Not on file  Occupational History   Not on file  Tobacco Use   Smoking status: Never   Smokeless tobacco: Never  Vaping Use   Vaping status: Never Used  Substance and Sexual Activity   Alcohol use: Never   Drug use: Never   Sexual activity: Yes    Partners: Female  Other Topics Concern   Not on file  Social History Narrative   Married no kids wife is Issaiah Seabrooks    -wife and mother Ane Banter on [place]      Has siblings    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   1 dog    Social Drivers of Corporate investment banker Strain: High Risk (10/11/2023)   Overall Financial Resource Strain (CARDIA)    Difficulty of Paying Living Expenses: Very hard  Food Insecurity: No Food Insecurity (01/08/2024)   Hunger Vital Sign    Worried About Running Out of Food in the Last Year: Never true    Ran Out of Food in the Last Year: Never true  Transportation Needs: No Transportation Needs (01/08/2024)   PRAPARE - Administrator, Civil Service (Medical): No    Lack of Transportation (Non-Medical): No  Physical Activity: Not on file  Stress: Not on file  Social Connections: Moderately Integrated (01/08/2024)   Social Connection and Isolation Panel [NHANES]    Frequency of Communication with Friends and Family: More than three times a week    Frequency of Social Gatherings with Friends and Family: Three times a week    Attends Religious Services: More than 4 times per year    Active Member of Clubs or Organizations: No    Attends Banker Meetings: Never    Marital Status: Married    Allergies:   Allergies  Allergen Reactions   Ambien  [Zolpidem ] Other (See Comments)    Pt requests we do not give this medication.     Current Medications: Current Outpatient Medications  Medication Sig Dispense Refill   Acetaminophen  (TYLENOL  PO) Take 500 mg by mouth every 6 (six) hours.  buPROPion  (WELLBUTRIN  XL) 150 MG 24 hr tablet Take 1 tablet (150 mg total) by mouth every morning. 30 tablet 2   DULoxetine  (CYMBALTA ) 60 MG capsule Take 1 capsule (60 mg total) by mouth 2 (two) times daily. 60 capsule 2   eszopiclone  (LUNESTA ) 2 MG TABS tablet Take 1 tablet (2 mg total) by mouth at bedtime as needed for sleep. Take immediately before bedtime 30 tablet 1   lidocaine -prilocaine  (EMLA ) cream Apply to affected area once (Patient taking differently: Apply 1 Application topically as needed (for chemo port, apply three hours before treatment).) 30 g 3   ondansetron  (ZOFRAN ) 8 MG tablet Take 1 tablet (8 mg total) by mouth every 8 (eight) hours as needed for nausea or vomiting. 30 tablet 1   prochlorperazine  (COMPAZINE ) 10 MG tablet Take 1 tablet (10 mg total) by mouth every 6 (six) hours as needed for nausea or vomiting. (Patient taking differently: Take 10 mg by mouth every 6 (six) hours as needed for nausea or vomiting. Have not started) 30 tablet 1   propranolol  (INDERAL ) 10 MG tablet TAKE 1 TABLET(10 MG) BY MOUTH TWICE DAILY AS NEEDED 60 tablet 2   Vitamin D , Ergocalciferol , (DRISDOL ) 1.25 MG (50000 UNIT) CAPS capsule Take 1 capsule (50,000 Units total) by mouth every 7 (seven) days. 12 capsule 3   No current facility-administered medications for this visit.     Psychiatric Specialty Exam: Review of Systems  There were no vitals taken for this visit.There is no height or weight on file to calculate BMI.  General Appearance: Well Groomed  Eye Contact:  Good  Speech:  Clear and Coherent and Normal Rate  Volume:  Normal  Mood:  Euthymic  Affect:  Congruent  Thought Process:  Coherent and Goal  Directed  Orientation:  Full (Time, Place, and Person)  Thought Content: Logical and Rumination   Suicidal Thoughts:  No  Homicidal Thoughts:  No  Memory:  NA  Judgement:  Fair  Insight:  Fair  Psychomotor Activity:  Normal  Concentration:  Concentration: Good  Recall:  Good  Fund of Knowledge: Fair  Language: Good  Akathisia:  NA    AIMS (if indicated): not done  Assets:  Passenger transport manager Vocational/Educational  ADL's:  Intact  Cognition: WNL  Sleep:  Good   Metabolic Disorder Labs: Lab Results  Component Value Date   HGBA1C 5.8 (H) 01/08/2024   MPG 119.76 01/08/2024   MPG 116.89 09/25/2023   No results found for: "PROLACTIN" Lab Results  Component Value Date   CHOL 158 01/08/2024   TRIG 363 (H) 01/08/2024   HDL 39 (L) 01/08/2024   CHOLHDL 4.1 01/08/2024   VLDL 73 (H) 01/08/2024   LDLCALC 46 01/08/2024   LDLCALC 117 (H) 05/08/2023   Lab Results  Component Value Date   TSH 1.160 01/08/2024   TSH 3.16 05/08/2023    Therapeutic Level Labs: No results found for: "LITHIUM" No results found for: "VALPROATE" No results found for: "CBMZ"   Screenings: GAD-7    Flowsheet Row Office Visit from 01/27/2024 in [facility] Visit from 07/18/2023 in [facility] Visit from 05/29/2023 in [facility] Visit from 03/12/2023 in [facility] Visit from 01/15/2023 in [facility]  Total GAD-7 Score 14 13 6 8 18       [employer]    Flowsheet Row  Office Visit from 01/27/2024 in [facility] Visit from 07/18/2023 in [facility] Visit from 05/29/2023 in [facility] Visit from 03/12/2023 in  [facility]  PHQ-2 Total Score 3 2 2 2 5   PHQ-9 Total Score 12 7 10 5 19       Flowsheet Row ED to Hosp-Admission (Discharged) from 01/08/2024 in [facility] Admission (Discharged) from 10/17/2023 in [facility] Admission (Discharged) from 09/25/2023 in [facility] [place] [facility]  C-SSRS RISK CATEGORY No Risk No Risk No Risk       Collaboration of Care: Collaboration of Care: Medication Management AEB medication prescription, Other provider involved in patient's care AEB oncology chart review, and Referral or follow-up with counselor/therapist AEB chart review  Patient/Guardian was advised Release of Information must be obtained prior to any record release in order to collaborate their care with an outside provider. Patient/Guardian was advised if they have not already done so to contact the registration department to sign all necessary forms in order for us  to release information regarding their care.   Consent: Patient/Guardian gives verbal consent for treatment and assignment of benefits for services provided during this visit. Patient/Guardian expressed understanding and agreed to proceed.    Yves Herb, MD 05/28/2024, 1:47 PM   Virtual Visit via Video Note  I connected with Carmelita Ching on 05/28/24 at 11:30 AM EDT by a video enabled telemedicine application and verified that I am speaking with the correct person using two identifiers.  Location: Patient: Home Provider: Home Office   I discussed the limitations of evaluation and management by telemedicine and the availability of in person appointments. The patient expressed understanding and agreed to proceed.   I discussed the assessment and treatment plan with the patient. The patient was provided an opportunity to ask questions and all were answered. The patient agreed  with the plan and demonstrated an understanding of the instructions.   The patient was advised to call back or seek an in-person evaluation if the symptoms worsen or if the condition fails to improve as anticipated.  I provided 30 minutes of non-face-to-face time during this encounter.   Yves Herb, MD

## 2024-05-28 ENCOUNTER — Telehealth (HOSPITAL_COMMUNITY): Admitting: Psychiatry

## 2024-05-28 ENCOUNTER — Encounter (HOSPITAL_COMMUNITY): Payer: Self-pay | Admitting: Psychiatry

## 2024-05-28 DIAGNOSIS — F330 Major depressive disorder, recurrent, mild: Secondary | ICD-10-CM

## 2024-05-28 DIAGNOSIS — F411 Generalized anxiety disorder: Secondary | ICD-10-CM | POA: Diagnosis not present

## 2024-05-28 MED ORDER — DULOXETINE HCL 60 MG PO CPEP
60.0000 mg | ORAL_CAPSULE | Freq: Two times a day (BID) | ORAL | 2 refills | Status: DC
Start: 2024-05-28 — End: 2024-07-23

## 2024-05-28 MED ORDER — BUPROPION HCL ER (XL) 150 MG PO TB24
150.0000 mg | ORAL_TABLET | ORAL | 2 refills | Status: DC
Start: 2024-05-28 — End: 2024-07-23

## 2024-05-29 ENCOUNTER — Other Ambulatory Visit: Payer: Self-pay

## 2024-06-01 ENCOUNTER — Telehealth: Payer: Self-pay | Admitting: Family Medicine

## 2024-06-01 ENCOUNTER — Other Ambulatory Visit: Payer: Self-pay | Admitting: Family Medicine

## 2024-06-01 DIAGNOSIS — E559 Vitamin D deficiency, unspecified: Secondary | ICD-10-CM

## 2024-06-01 DIAGNOSIS — Z1159 Encounter for screening for other viral diseases: Secondary | ICD-10-CM

## 2024-06-01 DIAGNOSIS — E785 Hyperlipidemia, unspecified: Secondary | ICD-10-CM

## 2024-06-01 NOTE — Telephone Encounter (Signed)
 Patient needs lab ordered please

## 2024-06-02 ENCOUNTER — Ambulatory Visit (INDEPENDENT_AMBULATORY_CARE_PROVIDER_SITE_OTHER): Admitting: Clinical

## 2024-06-02 ENCOUNTER — Encounter (HOSPITAL_COMMUNITY): Payer: Self-pay | Admitting: Clinical

## 2024-06-02 DIAGNOSIS — F411 Generalized anxiety disorder: Secondary | ICD-10-CM | POA: Diagnosis not present

## 2024-06-02 DIAGNOSIS — F330 Major depressive disorder, recurrent, mild: Secondary | ICD-10-CM | POA: Diagnosis not present

## 2024-06-02 NOTE — Progress Notes (Signed)
 THERAPIST PROGRESS NOTE  Session Time:  10:03am-10:52am   Session #17  Virtual Visit via Video Note  I connected with Cory Gomez on 06/02/24 at 10:00 AM EDT by a video enabled telemedicine application and verified that I am speaking with the correct person using two identifiers.  Location: Patient: home Provider: home office   I discussed the limitations of evaluation and management by telemedicine and the availability of in person appointments. The patient expressed understanding and agreed to proceed.   I discussed the assessment and treatment plan with the patient. The patient was provided an opportunity to ask questions and all were answered. The patient agreed with the plan and demonstrated an understanding of the instructions.   The patient was advised to call back or seek an in-person evaluation if the symptoms worsen or if the condition fails to improve as anticipated.  I provided 49 minutes of non-face-to-face time during this encounter.   Ancel Kass, LCSW   Participation Level: Active  Behavioral Response: Casual Alert Euthymic   Type of Therapy: Individual Therapy  Treatment Goals Addressed:  STG: Cory "[name]" will reduce frequency of avoidant behaviors by 50% as evidenced by self-report in therapy sessions  LTG: Learn a variety of coping skills and demonstrate the ability to use them to decrease feelings of sadness, anger, and fear and increase feelings of happiness, peace, and powerfulness AEB gauging those emotions on 1-10 scale.  LTG: Learn breathing techniques and grounding techniques at an age-appropriate level and demonstrate mastery in session then report independent use of these skills out of session.  LTG: Explore personal core beliefs, rules and assumptions, and cognitive distortions through therapist using Cognitive Behavioral Therapy; learn how to develop replacement thoughts and challenge unhelpful thoughts. STG: Score less than 9 on  the PHQ-9 and less than 5 on the GAD-7 as evidenced by intermittent administration of the questionnaires to determine progress in managing depression and anxiety.  LTG: Learn and practice communication techniques such as active listening, "I" statements, open-ended questions, reflective listening, assertiveness, fair fighting rules, initiating conversations, and more as necessary and taught in session.  LTG: Learn about boundary styles and types, how to implement them, and how to enforce them so that feels more empowered and content with being able to maintain more helpful, appropriate boundaries in the future for a more balanced result. LTG: Work to Arts development officer from models like CBT, Stages of Change, DBT, shame resilience theory, ACT, SFBT, MI, trauma-informed therapy and others to be able to manage mental health symptoms, AEB practicing out of session and reporting back.  STG: Improve self-esteem by engaging in daily affirmations, developing new skills, gratitude journaling, use of SMART goals, increased assertiveness, challenging negative beliefs, and focusing on what patient can control   LTG: Increase coping skills to manage depression/anxiety/anger and improve ability to perform daily activities as evidenced by fewer depressive episodes and/or anger outbursts and/or anxious days per self-report and collateral information.   ProgressTowards Goals: Progressing  Interventions: Assertiveness Training, Supportive, and Other: assessment   Summary: Cory Gomez is a 43 y.o. male who presents with MDD/severe, GAD, insomnia, relationship problems with his wife due to her constantly leaving jobs, and recent completion of cancer treatments.  He presented oriented x5 and stated he was feeling "nervous about next week's appointments with the oncologist to see if cancer is staying gone."  CSW evaluated patient's medication compliance, use of coping tools, and self-care, as applicable.  He provided  an  update on various aspects of his life that are normally discussed in therapy, including his recovery from cancer, his job search, his changing diet and activity level, and his wife.   He is adopting an attitude of "It is what it is" in reference to the check-up on cancer next week, stating it is not completely care-free but it is not bothering him as much as it would have in the past.  He is sleeping well, to the extent that he has come off his sleep medication.  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  We explored additional elimination of sugar in his diet, with CSW using pictorials shared via computer screen of how much sugar is in various products he has tended to like.  He reports that the tingling and numbness in his arm is still present, but they have one a scan and determined her does not have blood clots; thus, he has been asked to follow up with his oncologist for possible causes and recourse.  We processed where he is in relationship to wife who is buying into the food changes in the house so that it will help with her diabetes.  He was in a pleasant, almost jovial mood.  He agreed that soon we will be able to move to 3 weeks between appointments, then eventually 4 weeks.  He does not want to do that until his cancer clearance has been done.  Finally, CSW administered PHQ-9 and GAD-7 and found his scores to be so low they are below the threshhold for either illness, explained this to him.  Suicidal/Homicidal: No without intent/plan  Therapist Response: Patient is progressing AEB engaging in scheduled therapy session.  Throughout the session, CSW gave patient the opportunity to explore thoughts and feelings associated with current life situations and past/present stressors.   CSW challenged patient gently and appropriately to  consider different ways of looking at reported issues. CSW encouraged patient's expression of feelings and validated these using empathy, active listening, open body language, and unconditional positive regard.   Additional appointments were scheduled, spread further out.    Plan/Recommendations:  Return to therapy as scheduled on 6/24, continue to engage in self care especially that related to nutrition and fitness, keep applying for jobs  Diagnosis:  Encounter Diagnoses  Name Primary?   Major depressive disorder, recurrent episode, mild (HCC) Yes   GAD (generalized anxiety disorder)      Collaboration of Care: Psychiatrist AEB - psychiatrist and therapist both have access to notes in Epic  Patient/Guardian was advised Release of Information must be obtained prior to any record release in order to collaborate their care with an outside provider. Patient/Guardian was advised if they have not already done so to contact the registration department to sign all necessary forms in order for us  to release information regarding their care.   Consent: Patient/Guardian gives verbal consent for treatment and assignment of benefits for services provided during this visit. Patient/Guardian expressed understanding and agreed to proceed.   Ancel Kass, LCSW 06/02/2024       06/02/2024   10:40 AM 01/27/2024    8:57 AM 07/18/2023   11:16 AM 05/29/2023   11:37 AM 03/12/2023   10:53 AM  Depression screen PHQ 2/9  Decreased Interest 1 1 1 1 1   Down,  Depressed, Hopeless 1 2 1 1 1   PHQ - 2 Score 2 3 2 2 2   Altered sleeping 0 2 1 2  0  Tired, decreased energy 0 1 1 2 1   Change in appetite 0 2 0 0 1  Feeling bad or failure about yourself  1 2 1 1  0  Trouble concentrating 0 1 1 2 1   Moving slowly or fidgety/restless 0 1 1 1  0  Suicidal thoughts 0 0 0  0  PHQ-9 Score 3 12 7 10 5   Difficult doing work/chores   Somewhat difficult Somewhat difficult Somewhat difficult      06/02/2024   10:46 AM  01/27/2024    8:57 AM 07/18/2023   11:16 AM 05/29/2023   11:38 AM  GAD 7 : Generalized Anxiety Score  Nervous, Anxious, on Edge 1 2 2 1   Control/stop worrying 0 2 2 1   Worry too much - different things 0 2 2 0  Trouble relaxing 1 2 2 1   Restless 0 2 1 1   Easily annoyed or irritable 1 2 2 2   Afraid - awful might happen 0 2 2 0  Total GAD 7 Score 3 14 13 6   Anxiety Difficulty Not difficult at all  Somewhat difficult Somewhat difficult

## 2024-06-04 ENCOUNTER — Other Ambulatory Visit (INDEPENDENT_AMBULATORY_CARE_PROVIDER_SITE_OTHER): Payer: BC Managed Care – PPO

## 2024-06-04 ENCOUNTER — Encounter: Payer: Self-pay | Admitting: Hematology

## 2024-06-04 DIAGNOSIS — E559 Vitamin D deficiency, unspecified: Secondary | ICD-10-CM

## 2024-06-04 DIAGNOSIS — Z1159 Encounter for screening for other viral diseases: Secondary | ICD-10-CM

## 2024-06-04 DIAGNOSIS — E785 Hyperlipidemia, unspecified: Secondary | ICD-10-CM

## 2024-06-04 NOTE — Addendum Note (Signed)
 Addended by: Thressa Flora D on: 06/04/2024 07:43 AM   Modules accepted: Orders

## 2024-06-05 LAB — HEPATITIS C ANTIBODY: Hep C Virus Ab: NONREACTIVE

## 2024-06-05 LAB — LIPID PANEL
Chol/HDL Ratio: 6.1 ratio — ABNORMAL HIGH (ref 0.0–5.0)
Cholesterol, Total: 182 mg/dL (ref 100–199)
HDL: 30 mg/dL — ABNORMAL LOW (ref >39)
LDL Chol Calc (NIH): 106 mg/dL — ABNORMAL HIGH (ref 0–99)
Triglycerides: 267 mg/dL — ABNORMAL HIGH (ref 0–149)
VLDL Cholesterol Cal: 46 mg/dL — ABNORMAL HIGH (ref 5–40)

## 2024-06-05 LAB — VITAMIN D 25 HYDROXY (VIT D DEFICIENCY, FRACTURES): Vit D, 25-Hydroxy: 31.5 ng/mL (ref 30.0–100.0)

## 2024-06-08 ENCOUNTER — Other Ambulatory Visit: Payer: Self-pay

## 2024-06-08 ENCOUNTER — Ambulatory Visit: Payer: Self-pay | Admitting: Family Medicine

## 2024-06-09 ENCOUNTER — Encounter: Admitting: Family Medicine

## 2024-06-10 ENCOUNTER — Other Ambulatory Visit: Payer: Self-pay | Admitting: Nurse Practitioner

## 2024-06-10 ENCOUNTER — Inpatient Hospital Stay (HOSPITAL_BASED_OUTPATIENT_CLINIC_OR_DEPARTMENT_OTHER): Payer: Self-pay | Admitting: Nurse Practitioner

## 2024-06-10 ENCOUNTER — Inpatient Hospital Stay: Payer: Self-pay | Attending: Nurse Practitioner

## 2024-06-10 ENCOUNTER — Encounter: Payer: Self-pay | Admitting: Nurse Practitioner

## 2024-06-10 VITALS — BP 144/90 | HR 95 | Temp 98.2°F | Resp 18 | Ht 74.0 in | Wt 314.4 lb

## 2024-06-10 DIAGNOSIS — G629 Polyneuropathy, unspecified: Secondary | ICD-10-CM | POA: Insufficient documentation

## 2024-06-10 DIAGNOSIS — C187 Malignant neoplasm of sigmoid colon: Secondary | ICD-10-CM

## 2024-06-10 DIAGNOSIS — Z452 Encounter for adjustment and management of vascular access device: Secondary | ICD-10-CM | POA: Insufficient documentation

## 2024-06-10 DIAGNOSIS — Z95828 Presence of other vascular implants and grafts: Secondary | ICD-10-CM

## 2024-06-10 LAB — COMPREHENSIVE METABOLIC PANEL WITH GFR
ALT: 38 U/L (ref 0–44)
AST: 36 U/L (ref 15–41)
Albumin: 4.4 g/dL (ref 3.5–5.0)
Alkaline Phosphatase: 64 U/L (ref 38–126)
Anion gap: 7 (ref 5–15)
BUN: 13 mg/dL (ref 6–20)
CO2: 26 mmol/L (ref 22–32)
Calcium: 9.5 mg/dL (ref 8.9–10.3)
Chloride: 104 mmol/L (ref 98–111)
Creatinine, Ser: 0.8 mg/dL (ref 0.61–1.24)
GFR, Estimated: >60 mL/min (ref >60)
Glucose, Bld: 122 mg/dL — ABNORMAL HIGH (ref 70–99)
Potassium: 3.8 mmol/L (ref 3.5–5.1)
Sodium: 137 mmol/L (ref 135–145)
Total Bilirubin: 0.5 mg/dL (ref 0.0–1.2)
Total Protein: 7.5 g/dL (ref 6.5–8.1)

## 2024-06-10 LAB — CBC WITH DIFFERENTIAL/PLATELET
Abs Immature Granulocytes: 0.01 10*3/uL (ref 0.00–0.07)
Basophils Absolute: 0 10*3/uL (ref 0.0–0.1)
Basophils Relative: 1 %
Eosinophils Absolute: 0.2 10*3/uL (ref 0.0–0.5)
Eosinophils Relative: 3 %
HCT: 38.3 % — ABNORMAL LOW (ref 39.0–52.0)
Hemoglobin: 13.3 g/dL (ref 13.0–17.0)
Immature Granulocytes: 0 %
Lymphocytes Relative: 26 %
Lymphs Abs: 1.5 10*3/uL (ref 0.7–4.0)
MCH: 30 pg (ref 26.0–34.0)
MCHC: 34.7 g/dL (ref 30.0–36.0)
MCV: 86.3 fL (ref 80.0–100.0)
Monocytes Absolute: 0.6 10*3/uL (ref 0.1–1.0)
Monocytes Relative: 10 %
Neutro Abs: 3.6 10*3/uL (ref 1.7–7.7)
Neutrophils Relative %: 60 %
Platelets: 254 10*3/uL (ref 150–400)
RBC: 4.44 MIL/uL (ref 4.22–5.81)
RDW: 13.9 % (ref 11.5–15.5)
WBC: 5.9 10*3/uL (ref 4.0–10.5)
nRBC: 0 % (ref 0.0–0.2)

## 2024-06-10 LAB — CEA (ACCESS): CEA (CHCC): <1 ng/mL (ref 0.00–5.00)

## 2024-06-10 LAB — GENETIC SCREENING ORDER

## 2024-06-10 MED ORDER — HEPARIN SOD (PORK) LOCK FLUSH 100 UNIT/ML IV SOLN
500.0000 [IU] | Freq: Once | INTRAVENOUS | Status: AC | PRN
  Administered 2024-06-10: 500 [IU]

## 2024-06-10 MED ORDER — SODIUM CHLORIDE 0.9% FLUSH
10.0000 mL | INTRAVENOUS | Status: DC | PRN
  Administered 2024-06-10: 10 mL

## 2024-06-10 MED ORDER — NEURONTIN 100 MG PO CAPS: ORAL_CAPSULE | ORAL | 1 refills | Status: DC

## 2024-06-10 NOTE — Assessment & Plan Note (Addendum)
-  pT4aN1M0 stage IIIB, MMS, G2 -Presented with perforated diverticulitis in sigmoid colon in July, 2024. -Patient underwent laparoscopic resection of sigmoid colon. Path reviewed. There was moderate differentiated adenocarcinoma with 1 positive lymph nodes. -Adjuvant chemotherapy FOLFOX was recommended for 6 months. Oxaliplatin  was held from final 2 cycles of chemotherapy due to peripheral neuropathy.  -Surveillance CT scan on March 25, 2024 was negative for recurrence. -he completed chemotherapy on 04/15/2024.  -ctDNA signatera testing drawn 06/10/2024.  -surveillance CT CAP should be scheduled for 06/2024. This was ordered during today's visit

## 2024-06-10 NOTE — Progress Notes (Signed)
 Patient Care Team: Valli Gaw, MD as PCP - General (Family Medicine) Candyce Champagne, MD as Consulting Physician (General Surgery) Tobin Forts, MD as Consulting Physician (Gastroenterology) Sonja Starr, MD as Consulting Physician (Medical Oncology) Omega Bible, MD as Consulting Physician (Neurology) Daleen Dubs, DO as Consulting Physician (Otolaryngology)  Clinic Day:  06/10/2024  Referring physician: Valli Gaw, MD  ASSESSMENT & PLAN:   Assessment & Plan: Cancer of sigmoid colon - pT4a, pN1a - s/p roboric LAR resection 09/27/2023 -pT4aN1M0 stage IIIB, MMS, G2 -Presented with perforated diverticulitis in sigmoid colon in July, 2024. -Patient underwent laparoscopic resection of sigmoid colon. Path reviewed. There was moderate differentiated adenocarcinoma with 1 positive lymph nodes. -Adjuvant chemotherapy FOLFOX was recommended for 6 months. Oxaliplatin  was held from final 2 cycles of chemotherapy due to peripheral neuropathy.  -Surveillance CT scan on March 25, 2024 was negative for recurrence. -he completed chemotherapy on 04/15/2024.  -ctDNA signatera testing drawn 06/10/2024.  -surveillance CT CAP should be scheduled for 06/2024. This was ordered during today's visit     Peripheral neuropathy This is mostly in his right arm and hand. States that symptoms are intermittent. It comes on without warning. Neuropathy well last for several days. He states that he has not noted any pattern to the waxing and waning of the neuropathy. When active, he is unable to maintain grip with his right hand. He has dropped several things. He has been unable to lift anything with the right arm. 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 He currently receives long term disability which is helping him pay for groceries an household bills. He is open to a trial of gabapentin  100 mg. I recommend he start this in the evenings when the neuropathy is present.  If it is helpful, he may try a second dose during the day. We discussed common side effects of this medication which include fatigue, dizziness, and weakness. He knows not to drive or operate machinery while taking this medication until he knows how it effects him.   Fatigue Patient states that since he has been treated for cancer, he notes that he does get tired a bit more easily. He states that he does rest when needed. His energy is gradually improving.   Plan: Reviewed labs. -CBC and CMP are unremarkable today.  -CEA is pending.  -initial ctDNA testing with Signatera done today.  New CT CAP ordered for July 2024.  Plan to see the patient back for surveillance with labs in 3 months, including Signatera, sooner if needed.   The patient understands the plans discussed today and is in agreement with them.  He knows to contact our office if he develops concerns prior to his next appointment.  I provided 25 minutes of face-to-face time during this encounter and > 50% was spent counseling as documented under my assessment and plan.    Sharyon Deis, NP  [facility] - [facility] DEPT OF [facility] [address] [place] [address] Dept: [phone] Dept Fax: [phone]   Orders Placed This Encounter  Procedures   CT CHEST ABDOMEN PELVIS W CONTRAST    Standing Status:   Future    Expected Date:   07/01/2024    Expiration Date:   06/10/2025    If indicated for the ordered procedure, I authorize the administration of contrast media per Radiology protocol:   Yes  Does the patient have a contrast media/X-Elric dye allergy?:   No    Preferred imaging location?:   [facility]    If indicated for the ordered procedure, I authorize the administration of oral contrast media per Radiology protocol:   Yes   Genetic Screening Order      CHIEF COMPLAINT:  CC: cancer of sigmoid colon   Current Treatment:   surveillance  INTERVAL HISTORY:  Jacquis is here today for repeat clinical assessment. He was last seen by Dr. Maryalice Smaller on 04/15/2024. He completed last round chemotherapy on that date. His surveillance CT CAP from 03/2024 was negative. Today, he is having ctDNA signatera drawn with routine labs. He reports continued neuropathy in the right arm and hand. It is intermittent and causes tingling and weakness in his arm and hand when it is active. Difficult time gasping anything in the right hand due to weakness when it is active.  He also reports more fatigue than his baseline, though this is gradually improving. He denies chest pain, chest pressure, or shortness of breath. He denies headaches or visual disturbances. He denies abdominal pain, nausea, vomiting, or changes in bowel or bladder habits.  He denies blood in his stool. He denies fevers or chills. He denies pain. His appetite is good. His weight has been stable.  I have reviewed the past medical history, past surgical history, social history and family history with the patient and they are unchanged from previous note.  ALLERGIES:  is allergic to ambien  [zolpidem ].  MEDICATIONS:  Current Outpatient Medications  Medication Sig Dispense Refill   NEURONTIN  100 MG capsule Take 1 capsule at bedtime when needed. May increase to bid as needed and as tolerated 60 capsule 1   Acetaminophen  (TYLENOL  PO) Take 500 mg by mouth every 6 (six) hours.     buPROPion  (WELLBUTRIN  XL) 150 MG 24 hr tablet Take 1 tablet (150 mg total) by mouth every morning. 30 tablet 2   DULoxetine  (CYMBALTA ) 60 MG capsule Take 1 capsule (60 mg total) by mouth 2 (two) times daily. 60 capsule 2   eszopiclone  (LUNESTA ) 2 MG TABS tablet Take 1 tablet (2 mg total) by mouth at bedtime as needed for sleep. Take immediately before bedtime 30 tablet 1   lidocaine -prilocaine  (EMLA ) cream Apply to affected area once (Patient taking differently: Apply 1 Application topically as needed (for chemo port,  apply three hours before treatment).) 30 g 3   ondansetron  (ZOFRAN ) 8 MG tablet Take 1 tablet (8 mg total) by mouth every 8 (eight) hours as needed for nausea or vomiting. 30 tablet 1   prochlorperazine  (COMPAZINE ) 10 MG tablet Take 1 tablet (10 mg total) by mouth every 6 (six) hours as needed for nausea or vomiting. (Patient taking differently: Take 10 mg by mouth every 6 (six) hours as needed for nausea or vomiting. Have not started) 30 tablet 1   propranolol  (INDERAL ) 10 MG tablet TAKE 1 TABLET(10 MG) BY MOUTH TWICE DAILY AS NEEDED 60 tablet 2   Vitamin D , Ergocalciferol , (DRISDOL ) 1.25 MG (50000 UNIT) CAPS capsule Take 1 capsule (50,000 Units total) by mouth every 7 (seven) days. 12 capsule 3   No current facility-administered medications for this visit.    HISTORY OF PRESENT ILLNESS:   Oncology History  Cancer of sigmoid colon - pT4a, pN1a - s/p roboric LAR resection 09/27/2023  09/25/2023 Cancer Staging   Staging form: Colon and Rectum, AJCC 8th Edition - Pathologic stage from 09/25/2023: Stage IIIB (pT4a, pN1a, cM0) -  Signed by Sharyon Deis, NP on 10/09/2023 Total positive nodes: 1 Histologic grading system: 4 grade system Histologic grade (G): G2 Residual tumor (R): R0 - None   09/25/2023 Surgery   Laparoscopic resection of sigmoid colon  Invasive, moderately differentiated adenocarcinoma of sigmoid colon. Invades into werosal surface. Margins are clear. Negative lymphovascular and perineural invasion. Metastasis to 1/45 lymph nodes.    09/25/2023 Pathology Results   FINAL MICROSCOPIC DIAGNOSIS:  A.   COLON, RESECTION: - Invasive moderately differentiated adenocarcinoma, 7.8 cm involving sigmoid colon - Carcinoma invades into the serosal surface - Resection margins are negative for carcinoma - Negative for lymphovascular or perineural invasion - Metastatic carcinoma to one of forty-five lymph nodes (1/45) - See oncology table B.   COLON, DISTAL MARGIN, EXCISION: - Colonic  donut within normal limits, negative for carcinoma  ONCOLOGY TABLE:  COLON AND RECTUM, CARCINOMA:  Resection, Including Transanal Disk Excision of Rectal Neoplasms  Procedure: Resection, rectosigmoid colon Tumor Site: Sigmoid colon Tumor Size: 7.8 cm Macroscopic Tumor Perforation: Not identified  Histologic Type: Adenocarcinoma Histologic Grade: G2: Moderately differentiated Multiple Primary Sites: Not applicable Tumor Extension: Carcinoma invades into the serosal surface Lymphovascular Invasion: Not identified Perineural Invasion: Not identified Treatment Effect: No known presurgical therapy Margins:      Margin Status for Invasive Carcinoma: All margins negative for invasive carcinoma      Margin Status for Non-Invasive Tumor: All margins negative for high-grade dysplasia / intramucosal           carcinoma and low-grade dysplasia Regional Lymph Nodes:      Number of Lymph Nodes with Tumor: 1      Number of Lymph Nodes Examined: 45 Tumor Deposits: Not identified Distant Metastasis:      Distant Site(s) Involved: Not applicable Pathologic Stage Classification (pTNM, AJCC 8th Edition): pT4a, pN1a Ancillary Studies: MMR / MSI testing will be ordered. Representative Tumor Block: A2 Comments: None (v4.2.0.1)     09/30/2023 Initial Diagnosis   Cancer of sigmoid colon - pT4a, pN1a - s/p roboric LAR resection 09/27/2023   10/08/2023 Imaging   CT Chest with contrast  IMPRESSION: Negative. No evidence of thoracic metastatic disease or other significant abnormality.   10/23/2023 -  Chemotherapy   Patient is on Treatment Plan : COLORECTAL FOLFOX q14d x 6 months      Genetic Testing   [facility] CancerNext-Expanded Panel+RNA was Negative. Report date is 11/15/2023.   The CancerNext-Expanded gene panel offered by [facility] and includes sequencing, rearrangement, and RNA analysis for the following 71 genes: AIP, ALK, APC, ATM, AXIN2, BAP1, BARD1, BMPR1A, BRCA1, BRCA2, BRIP1,  CDC73, CDH1, CDK4, CDKN1B, CDKN2A, CHEK2, CTNNA1, DICER1, FH, FLCN, KIF1B, LZTR1, MAX, MEN1, MET, MLH1, MSH2, MSH3, MSH6, MUTYH, NF1, NF2, NTHL1, PALB2, PHOX2B, PMS2, POT1, PRKAR1A, PTCH1, PTEN, RAD51C, RAD51D, RB1, RET, SDHA, SDHAF2, SDHB, SDHC, SDHD, SMAD4, SMARCA4, SMARCB1, SMARCE1, STK11, SUFU, TMEM127, TP53, TSC1, TSC2, and VHL (sequencing and deletion/duplication); EGFR, EGLN1, HOXB13, KIT, MITF, PDGFRA, POLD1, and POLE (sequencing only); EPCAM and GREM1 (deletion/duplication only).         REVIEW OF SYSTEMS:   Constitutional: Denies fevers, chills or abnormal weight loss Eyes: Denies blurriness of vision Ears, nose, mouth, throat, and face: Denies mucositis or sore throat Respiratory: Denies cough, dyspnea or wheezes Cardiovascular: Denies palpitation, chest discomfort or lower extremity swelling Gastrointestinal:  Denies nausea, heartburn or change in bowel habits Skin: Denies abnormal skin rashes Lymphatics: Denies new lymphadenopathy or easy bruising Neurological:Denies numbness, tingling or new weaknesses Behavioral/Psych: Mood is  stable, no new changes  Neuro: persistent peripheral neuropathy in the right arm and hand.  All other systems were reviewed with the patient and are negative.   VITALS:   Today's Vitals   06/10/24 1343 06/10/24 1344  BP: (!) 156/74 (!) 144/90  Pulse: 95   Resp: 18   Temp: 98.2 F (36.8 C)   TempSrc: Temporal   SpO2: 99%   Weight: (!) 314 lb 6.4 oz (142.6 kg)   Height: 6' 2 (1.88 m)   PainSc:  0-No pain   Body mass index is 40.37 kg/m.   Wt Readings from Last 3 Encounters:  06/10/24 (!) 314 lb 6.4 oz (142.6 kg)  04/15/24 (!) 313 lb (142 kg)  04/01/24 (!) 309 lb (140.2 kg)    Body mass index is 40.37 kg/m.  Performance status (ECOG): 1 - Symptomatic but completely ambulatory  PHYSICAL EXAM:   GENERAL:alert, no distress and comfortable SKIN: skin color, texture, turgor are normal, no rashes or significant lesions EYES: normal,  Conjunctiva are pink and non-injected, sclera clear OROPHARYNX:no exudate, no erythema and lips, buccal mucosa, and tongue normal  NECK: supple, thyroid  normal size, non-tender, without nodularity LYMPH:  no palpable lymphadenopathy in the cervical, axillary or inguinal LUNGS: clear to auscultation and percussion with normal breathing effort HEART: regular rate & rhythm and no murmurs and no lower extremity edema ABDOMEN:abdomen soft, non-tender and normal bowel sounds Musculoskeletal:no cyanosis of digits and no clubbing  NEURO: alert & oriented x 3 with fluent speech, no focal motor/sensory deficits  LABORATORY DATA:  I have reviewed the data as listed    Component Value Date/Time   NA 137 06/10/2024 1256   K 3.8 06/10/2024 1256   CL 104 06/10/2024 1256   CO2 26 06/10/2024 1256   GLUCOSE 122 (H) 06/10/2024 1256   BUN 13 06/10/2024 1256   CREATININE 0.80 06/10/2024 1256   CREATININE 0.78 04/15/2024 1125   CALCIUM  9.5 06/10/2024 1256   PROT 7.5 06/10/2024 1256   ALBUMIN 4.4 06/10/2024 1256   AST 36 06/10/2024 1256   AST 28 04/15/2024 1125   ALT 38 06/10/2024 1256   ALT 32 04/15/2024 1125   ALKPHOS 64 06/10/2024 1256   BILITOT 0.5 06/10/2024 1256   BILITOT 0.5 04/15/2024 1125   GFRNONAA >60 06/10/2024 1256   GFRNONAA >60 04/15/2024 1125    Lab Results  Component Value Date   WBC 5.9 06/10/2024   NEUTROABS 3.6 06/10/2024   HGB 13.3 06/10/2024   HCT 38.3 (L) 06/10/2024   MCV 86.3 06/10/2024   PLT 254 06/10/2024

## 2024-06-10 NOTE — Progress Notes (Unsigned)
 Patient Care Team: Valli Gaw, MD as PCP - General (Family Medicine) Candyce Champagne, MD as Consulting Physician (General Surgery) Tobin Forts, MD as Consulting Physician (Gastroenterology) Sonja [place], MD as Consulting Physician (Medical Oncology) Omega Bible, MD as Consulting Physician (Neurology) Daleen Dubs, DO as Consulting Physician (Otolaryngology)  Clinic Day:  06/10/2024  Referring physician: No ref. provider found  ASSESSMENT & PLAN:   Assessment & Plan: No problem-specific Assessment & Plan notes found for this encounter.    The patient understands the plans discussed today and is in agreement with them.  He knows to contact our office if he develops concerns prior to his next appointment.  I provided *** minutes of face-to-face time during this encounter and > 50% was spent counseling as documented under my assessment and plan.    Sharyon Deis, NP  [facility] - A DEPT OF [facility] [address] [place] [address] Dept: [phone] Dept Fax: [phone]   Orders Placed This Encounter  Procedures   CEA (Access)    Standing Status:   Future    Expected Date:   06/10/2024    Expiration Date:   06/10/2025      CHIEF COMPLAINT:  CC: ***  Current Treatment:  ***  INTERVAL HISTORY:  Cory Gomez is here today for repeat clinical assessment. He denies fevers or chills. He denies pain. His appetite is good. His weight {Weight change:10426}.  I have reviewed the past medical history, past surgical history, social history and family history with the patient and they are unchanged from previous note.  ALLERGIES:  is allergic to ambien  [zolpidem ].  MEDICATIONS:  Current Outpatient Medications  Medication Sig Dispense Refill   Acetaminophen  (TYLENOL  PO) Take 500 mg by mouth every 6 (six) hours.     buPROPion  (WELLBUTRIN  XL) 150 MG 24 hr tablet Take 1 tablet (150 mg total) by mouth  every morning. 30 tablet 2   DULoxetine  (CYMBALTA ) 60 MG capsule Take 1 capsule (60 mg total) by mouth 2 (two) times daily. 60 capsule 2   eszopiclone  (LUNESTA ) 2 MG TABS tablet Take 1 tablet (2 mg total) by mouth at bedtime as needed for sleep. Take immediately before bedtime 30 tablet 1   lidocaine -prilocaine  (EMLA ) cream Apply to affected area once (Patient taking differently: Apply 1 Application topically as needed (for chemo port, apply three hours before treatment).) 30 g 3   ondansetron  (ZOFRAN ) 8 MG tablet Take 1 tablet (8 mg total) by mouth every 8 (eight) hours as needed for nausea or vomiting. 30 tablet 1   prochlorperazine  (COMPAZINE ) 10 MG tablet Take 1 tablet (10 mg total) by mouth every 6 (six) hours as needed for nausea or vomiting. (Patient taking differently: Take 10 mg by mouth every 6 (six) hours as needed for nausea or vomiting. Have not started) 30 tablet 1   propranolol  (INDERAL ) 10 MG tablet TAKE 1 TABLET(10 MG) BY MOUTH TWICE DAILY AS NEEDED 60 tablet 2   Vitamin D , Ergocalciferol , (DRISDOL ) 1.25 MG (50000 UNIT) CAPS capsule Take 1 capsule (50,000 Units total) by mouth every 7 (seven) days. 12 capsule 3   No current facility-administered medications for this visit.    HISTORY OF PRESENT ILLNESS:   Oncology History  Cancer of sigmoid colon - pT4a, pN1a - s/p roboric LAR resection 09/27/2023  09/25/2023 Cancer Staging   Staging form: Colon and Rectum, AJCC 8th Edition - Pathologic stage from 09/25/2023:  Stage IIIB (pT4a, pN1a, cM0) - Signed by Sharyon Deis, NP on 10/09/2023 Total positive nodes: 1 Histologic grading system: 4 grade system Histologic grade (G): G2 Residual tumor (R): R0 - None   09/25/2023 Surgery   Laparoscopic resection of sigmoid colon  Invasive, moderately differentiated adenocarcinoma of sigmoid colon. Invades into werosal surface. Margins are clear. Negative lymphovascular and perineural invasion. Metastasis to 1/45 lymph nodes.    09/25/2023  Pathology Results   FINAL MICROSCOPIC DIAGNOSIS:  A.   COLON, RESECTION: - Invasive moderately differentiated adenocarcinoma, 7.8 cm involving sigmoid colon - Carcinoma invades into the serosal surface - Resection margins are negative for carcinoma - Negative for lymphovascular or perineural invasion - Metastatic carcinoma to one of forty-five lymph nodes (1/45) - See oncology table B.   COLON, DISTAL MARGIN, EXCISION: - Colonic donut within normal limits, negative for carcinoma  ONCOLOGY TABLE:  COLON AND RECTUM, CARCINOMA:  Resection, Including Transanal Disk Excision of Rectal Neoplasms  Procedure: Resection, rectosigmoid colon Tumor Site: Sigmoid colon Tumor Size: 7.8 cm Macroscopic Tumor Perforation: Not identified  Histologic Type: Adenocarcinoma Histologic Grade: G2: Moderately differentiated Multiple Primary Sites: Not applicable Tumor Extension: Carcinoma invades into the serosal surface Lymphovascular Invasion: Not identified Perineural Invasion: Not identified Treatment Effect: No known presurgical therapy Margins:      Margin Status for Invasive Carcinoma: All margins negative for invasive carcinoma      Margin Status for Non-Invasive Tumor: All margins negative for high-grade dysplasia / intramucosal           carcinoma and low-grade dysplasia Regional Lymph Nodes:      Number of Lymph Nodes with Tumor: 1      Number of Lymph Nodes Examined: 45 Tumor Deposits: Not identified Distant Metastasis:      Distant Site(s) Involved: Not applicable Pathologic Stage Classification (pTNM, AJCC 8th Edition): pT4a, pN1a Ancillary Studies: MMR / MSI testing will be ordered. Representative Tumor Block: A2 Comments: None (v4.2.0.1)     09/30/2023 Initial Diagnosis   Cancer of sigmoid colon - pT4a, pN1a - s/p roboric LAR resection 09/27/2023   10/08/2023 Imaging   CT Chest with contrast  IMPRESSION: Negative. No evidence of thoracic metastatic disease or  other significant abnormality.   10/23/2023 -  Chemotherapy   Patient is on Treatment Plan : COLORECTAL FOLFOX q14d x 6 months      Genetic Testing   [facility] CancerNext-Expanded Panel+RNA was Negative. Report date is 11/15/2023.   The CancerNext-Expanded gene panel offered by [facility] and includes sequencing, rearrangement, and RNA analysis for the following 71 genes: AIP, ALK, APC, ATM, AXIN2, BAP1, BARD1, BMPR1A, BRCA1, BRCA2, BRIP1, CDC73, CDH1, CDK4, CDKN1B, CDKN2A, CHEK2, CTNNA1, DICER1, FH, FLCN, KIF1B, LZTR1, MAX, MEN1, MET, MLH1, MSH2, MSH3, MSH6, MUTYH, NF1, NF2, NTHL1, PALB2, PHOX2B, PMS2, POT1, PRKAR1A, PTCH1, PTEN, RAD51C, RAD51D, RB1, RET, SDHA, SDHAF2, SDHB, SDHC, SDHD, SMAD4, SMARCA4, SMARCB1, SMARCE1, STK11, SUFU, TMEM127, TP53, TSC1, TSC2, and VHL (sequencing and deletion/duplication); EGFR, EGLN1, HOXB13, KIT, MITF, PDGFRA, POLD1, and POLE (sequencing only); EPCAM and GREM1 (deletion/duplication only).         REVIEW OF SYSTEMS:   Constitutional: Denies fevers, chills or abnormal weight loss Eyes: Denies blurriness of vision Ears, nose, mouth, throat, and face: Denies mucositis or sore throat Respiratory: Denies cough, dyspnea or wheezes Cardiovascular: Denies palpitation, chest discomfort or lower extremity swelling Gastrointestinal:  Denies nausea, heartburn or change in bowel habits Skin: Denies abnormal skin rashes Lymphatics: Denies new lymphadenopathy or easy bruising Neurological:Denies numbness, tingling  or new weaknesses Behavioral/Psych: Mood is stable, no new changes  All other systems were reviewed with the patient and are negative.   VITALS:  There were no vitals taken for this visit.  Wt Readings from Last 3 Encounters:  04/15/24 (!) 313 lb (142 kg)  04/01/24 (!) 309 lb (140.2 kg)  03/18/24 (!) 313 lb 6.4 oz (142.2 kg)    There is no height or weight on file to calculate BMI.  Performance status (ECOG): {CHL ONC [id number]  PHYSICAL  EXAM:   GENERAL:alert, no distress and comfortable SKIN: skin color, texture, turgor are normal, no rashes or significant lesions EYES: normal, Conjunctiva are pink and non-injected, sclera clear OROPHARYNX:no exudate, no erythema and lips, buccal mucosa, and tongue normal  NECK: supple, thyroid  normal size, non-tender, without nodularity LYMPH:  no palpable lymphadenopathy in the cervical, axillary or inguinal LUNGS: clear to auscultation and percussion with normal breathing effort HEART: regular rate & rhythm and no murmurs and no lower extremity edema ABDOMEN:abdomen soft, non-tender and normal bowel sounds Musculoskeletal:no cyanosis of digits and no clubbing  NEURO: alert & oriented x 3 with fluent speech, no focal motor/sensory deficits  LABORATORY DATA:  I have reviewed the data as listed    Component Value Date/Time   NA 137 04/15/2024 1125   K 4.0 04/15/2024 1125   CL 105 04/15/2024 1125   CO2 28 04/15/2024 1125   GLUCOSE 104 (H) 04/15/2024 1125   BUN 10 04/15/2024 1125   CREATININE 0.78 04/15/2024 1125   CALCIUM  9.3 04/15/2024 1125   PROT 7.2 04/15/2024 1125   ALBUMIN 4.4 04/15/2024 1125   AST 28 04/15/2024 1125   ALT 32 04/15/2024 1125   ALKPHOS 68 04/15/2024 1125   BILITOT 0.5 04/15/2024 1125   GFRNONAA >60 04/15/2024 1125    No results found for: SPEP, UPEP  Lab Results  Component Value Date   WBC 3.2 (L) 04/15/2024   NEUTROABS 1.4 (L) 04/15/2024   HGB 12.0 (L) 04/15/2024   HCT 35.1 (L) 04/15/2024   MCV 84.2 04/15/2024   PLT 152 04/15/2024      Chemistry      Component Value Date/Time   NA 137 04/15/2024 1125   K 4.0 04/15/2024 1125   CL 105 04/15/2024 1125   CO2 28 04/15/2024 1125   BUN 10 04/15/2024 1125   CREATININE 0.78 04/15/2024 1125      Component Value Date/Time   CALCIUM  9.3 04/15/2024 1125   ALKPHOS 68 04/15/2024 1125   AST 28 04/15/2024 1125   ALT 32 04/15/2024 1125   BILITOT 0.5 04/15/2024 1125       RADIOGRAPHIC  STUDIES: I have personally reviewed the radiological images as listed and agreed with the findings in the report. No results found.

## 2024-06-11 ENCOUNTER — Encounter: Admitting: Family Medicine

## 2024-06-11 ENCOUNTER — Other Ambulatory Visit: Payer: Self-pay

## 2024-06-11 ENCOUNTER — Telehealth: Payer: Self-pay | Admitting: Nurse Practitioner

## 2024-06-11 ENCOUNTER — Encounter: Payer: BC Managed Care – PPO | Admitting: Family Medicine

## 2024-06-11 NOTE — Telephone Encounter (Signed)
 Scheduled appointments per 6/18 los. Talked with the patient and he is aware of the made appointments.

## 2024-06-12 ENCOUNTER — Other Ambulatory Visit: Payer: Self-pay

## 2024-06-15 ENCOUNTER — Encounter: Payer: Self-pay | Admitting: Internal Medicine

## 2024-06-15 ENCOUNTER — Ambulatory Visit (INDEPENDENT_AMBULATORY_CARE_PROVIDER_SITE_OTHER): Payer: Self-pay | Admitting: Internal Medicine

## 2024-06-15 ENCOUNTER — Encounter: Payer: Self-pay | Admitting: Nurse Practitioner

## 2024-06-15 VITALS — BP 128/76 | HR 93 | Temp 97.8°F | Ht 74.0 in | Wt 317.6 lb

## 2024-06-15 DIAGNOSIS — T451X5A Adverse effect of antineoplastic and immunosuppressive drugs, initial encounter: Secondary | ICD-10-CM

## 2024-06-15 DIAGNOSIS — F3341 Major depressive disorder, recurrent, in partial remission: Secondary | ICD-10-CM

## 2024-06-15 DIAGNOSIS — E559 Vitamin D deficiency, unspecified: Secondary | ICD-10-CM

## 2024-06-15 DIAGNOSIS — I10 Essential (primary) hypertension: Secondary | ICD-10-CM

## 2024-06-15 DIAGNOSIS — Z6841 Body Mass Index (BMI) 40.0 and over, adult: Secondary | ICD-10-CM

## 2024-06-15 DIAGNOSIS — G620 Drug-induced polyneuropathy: Secondary | ICD-10-CM

## 2024-06-15 DIAGNOSIS — R7303 Prediabetes: Secondary | ICD-10-CM

## 2024-06-15 DIAGNOSIS — F411 Generalized anxiety disorder: Secondary | ICD-10-CM

## 2024-06-15 LAB — VITAMIN D 25 HYDROXY (VIT D DEFICIENCY, FRACTURES): VITD: 18.63 ng/mL — ABNORMAL LOW (ref 30.00–100.00)

## 2024-06-15 LAB — HEMOGLOBIN A1C: Hgb A1c MFr Bld: 5.9 % (ref 4.6–6.5)

## 2024-06-15 NOTE — Assessment & Plan Note (Signed)
-   Patient has a history of prediabetes with an A1c of 5.8 in January of this year -We will recheck his A1c today -Patient encouraged to continue his diet and exercise -No further workup at this time

## 2024-06-15 NOTE — Assessment & Plan Note (Signed)
-   Patient has history of vitamin D  deficiency and is on high-dose vitamin D  currently -Will recheck his vitamin D  level today -No further workup at this time

## 2024-06-15 NOTE — Assessment & Plan Note (Signed)
-   This problem is chronic and stable -Patient is doing well on the Cymbalta  with a GAD-7 score of 4 today -Patient also follows up with a therapist and feels like overall he is doing very well -Will continue to follow-up with his therapist and continue with the Cymbalta  for now

## 2024-06-15 NOTE — Progress Notes (Signed)
 Complete physical exam  Patient: Cory Gomez   DOB: 12/02/1981   43 y.o. Male  MRN: [id number]  Subjective:    Chief Complaint  Patient presents with   Annual Exam    Cory Gomez is a 43 y.o. male who presents today for a complete physical exam. He reports consuming a general diet. Home exercise routine includes walking 1 hrs per day. He generally feels well. He reports sleeping well. He does not have additional problems to discuss today.    Most recent fall risk assessment:    06/15/2024    2:29 PM  Fall Risk   Falls in the past year? 0  Number falls in past yr: 0  Injury with Fall? 0  Risk for fall due to : No Fall Risks  Follow up Falls evaluation completed     Most recent depression screenings:    06/15/2024    2:29 PM 06/02/2024   10:40 AM  PHQ 2/9 Scores  PHQ - 2 Score 1   PHQ- 9 Score 3      Information is confidential and restricted. Go to Review Flowsheets to unlock data.        Patient Care Team: Hope Merle, MD as PCP - General (Family Medicine) Sheldon Standing, MD as Consulting Physician (General Surgery) Abran Norleen SAILOR, MD as Consulting Physician (Gastroenterology) Lanny Callander, MD as Consulting Physician (Medical Oncology) Margaret Eduard SAUNDERS, MD as Consulting Physician (Neurology) Skotnicki, Gerard LABOR, DO as Consulting Physician (Otolaryngology)   Outpatient Medications Prior to Visit  Medication Sig   Acetaminophen  (TYLENOL  PO) Take 500 mg by mouth every 6 (six) hours.   buPROPion  (WELLBUTRIN  XL) 150 MG 24 hr tablet Take 1 tablet (150 mg total) by mouth every morning.   DULoxetine  (CYMBALTA ) 60 MG capsule Take 1 capsule (60 mg total) by mouth 2 (two) times daily.   eszopiclone  (LUNESTA ) 2 MG TABS tablet Take 1 tablet (2 mg total) by mouth at bedtime as needed for sleep. Take immediately before bedtime   lidocaine -prilocaine  (EMLA ) cream Apply to affected area once (Patient taking differently: Apply 1 Application topically as needed  (for chemo port, apply three hours before treatment).)   NEURONTIN  100 MG capsule Take 1 capsule at bedtime when needed. May increase to bid as needed and as tolerated   ondansetron  (ZOFRAN ) 8 MG tablet Take 1 tablet (8 mg total) by mouth every 8 (eight) hours as needed for nausea or vomiting.   prochlorperazine  (COMPAZINE ) 10 MG tablet Take 1 tablet (10 mg total) by mouth every 6 (six) hours as needed for nausea or vomiting. (Patient taking differently: Take 10 mg by mouth every 6 (six) hours as needed for nausea or vomiting. Have not started)   propranolol  (INDERAL ) 10 MG tablet TAKE 1 TABLET(10 MG) BY MOUTH TWICE DAILY AS NEEDED   Vitamin D , Ergocalciferol , (DRISDOL ) 1.25 MG (50000 UNIT) CAPS capsule Take 1 capsule (50,000 Units total) by mouth every 7 (seven) days.   No facility-administered medications prior to visit.    Review of Systems  Constitutional: Negative.   HENT: Negative.    Respiratory: Negative.    Cardiovascular: Negative.   Gastrointestinal: Negative.   Musculoskeletal: Negative.   Neurological:  Positive for tingling and sensory change.  Psychiatric/Behavioral: Negative.            Objective:     BP 128/76   Pulse 93   Temp 97.8 F (36.6 C)   Ht 6' 2 (1.88 m)  Wt (!) 317 lb 9.6 oz (144.1 kg)   SpO2 97%   BMI 40.78 kg/m    Physical Exam Constitutional:      Appearance: Normal appearance.  HENT:     Head: Normocephalic and atraumatic.   Cardiovascular:     Rate and Rhythm: Normal rate and regular rhythm.     Heart sounds: Normal heart sounds.  Pulmonary:     Breath sounds: No wheezing or rales.  Abdominal:     General: There is no distension.     Palpations: Abdomen is soft.     Tenderness: There is no abdominal tenderness.   Neurological:     Mental Status: He is alert and oriented to person, place, and time. Mental status is at baseline.      No results found for any visits on 06/15/24.     Assessment & Plan:    Routine Health  Maintenance and Physical Exam  Immunization History  Administered Date(s) Administered   Influenza,inj,Quad PF,6+ Mos 09/08/2019   Influenza-Unspecified 09/23/2021   PFIZER Comirnaty(Gray Top)Covid-19 Tri-Sucrose Vaccine 03/09/2021, 04/07/2021   Tdap 06/14/2020    Health Maintenance  Topic Date Due   HPV VACCINES (1 - Risk 3-dose SCDM series) Never done   COVID-19 Vaccine (3 - Pfizer risk series) 07/01/2024 (Originally 05/05/2021)   INFLUENZA VACCINE  07/24/2024   DTaP/Tdap/Td (2 - Td or Tdap) 06/14/2030   Hepatitis C Screening  Completed   HIV Screening  Completed   Meningococcal B Vaccine  Aged Out    Discussed health benefits of physical activity, and encouraged him to engage in regular exercise appropriate for his age and condition.  Problem List Items Addressed This Visit       Cardiovascular and Mediastinum   Hypertension   -This problem is chronic and stable -Patient had a mildly elevated blood pressure check-in at 134/82 but repeat blood pressure showed systolic less than 130 - Patient was previously on amlodipine  but is now off this medication -I encouraged him to continue with diet exercise and weight loss as will help with his blood pressure as well -No further workup at this time        Nervous and Auditory   Chemotherapy-induced neuropathy (HCC)   - Patient with a history of right-sided neuropathy from his chemotherapy -Follows up with oncology for this and was recently prescribed gabapentin  to see if this will help with this tingling and numbness -Patient does occasionally drop items that are in his right hand and  needs this to improve prior to returning to work.  He was told oncology that this should slowly improve over time -Will see how patient does on the gabapentin  and he will continue to follow-up with oncology        Other   GAD (generalized anxiety disorder)   - This problem is chronic and stable -Patient is doing well on the Cymbalta  with a GAD-7  score of 4 today -Patient also follows up with a therapist and feels like overall he is doing very well -Will continue to follow-up with his therapist and continue with the Cymbalta  for now      Major depressive disorder, recurrent episode, in partial remission (HCC) - Primary   - Patient states that his depression is well-controlled today and has a PHQ-9 score of 3 -He is compliant with the Cymbalta  60 mg daily as well as his Wellbutrin  150 mg daily -He follows up with a therapist and states that he is doing well with this -  No further workup at this time -Denies any other complaints at this time      Morbid obesity with BMI of 40.0-44.9, adult (HCC)   - This problem is chronic and stable -Patient's weight is up slightly from 304 pounds in March to 317 pounds today - We discussed diet and exercise and I did give him some reading material on obesity -We discussed the importance of weight loss and patient states that he is exercising and trying to follow diet -Will continue to follow closely      Prediabetes   - Patient has a history of prediabetes with an A1c of 5.8 in January of this year -We will recheck his A1c today -Patient encouraged to continue his diet and exercise -No further workup at this time      Relevant Orders   Hemoglobin A1c   Vitamin D  deficiency   - Patient has history of vitamin D  deficiency and is on high-dose vitamin D  currently -Will recheck his vitamin D  level today -No further workup at this time      Relevant Orders   VITAMIN D  25 Hydroxy (Vit-D Deficiency, Fractures)   No follow-ups on file.     Casey Fye, MD

## 2024-06-15 NOTE — Assessment & Plan Note (Signed)
-   This problem is chronic and stable -Patient's weight is up slightly from 304 pounds in March to 317 pounds today - We discussed diet and exercise and I did give him some reading material on obesity -We discussed the importance of weight loss and patient states that he is exercising and trying to follow diet -Will continue to follow closely

## 2024-06-15 NOTE — Assessment & Plan Note (Signed)
-   Patient with a history of right-sided neuropathy from his chemotherapy -Follows up with oncology for this and was recently prescribed gabapentin  to see if this will help with this tingling and numbness -Patient does occasionally drop items that are in his right hand and  needs this to improve prior to returning to work.  He was told oncology that this should slowly improve over time -Will see how patient does on the gabapentin  and he will continue to follow-up with oncology

## 2024-06-15 NOTE — Assessment & Plan Note (Signed)
-  This problem is chronic and stable -Patient had a mildly elevated blood pressure check-in at 134/82 but repeat blood pressure showed systolic less than 130 - Patient was previously on amlodipine  but is now off this medication -I encouraged him to continue with diet exercise and weight loss as will help with his blood pressure as well -No further workup at this time

## 2024-06-15 NOTE — Patient Instructions (Addendum)
-   It was a pleasure meeting you today -We will check some blood work on you today including an A1c and vitamin D  level -Your recent blood work showed a normal kidney function, liver function and blood counts.   -Your bad cholesterol level is slightly high.  Continue with your diet and exercise to help improve this as well as your weight -Your blood pressure slightly elevated today.  I suspect this should improve as your weight decreases -Please contact us  with any questions or concerns or if you need any refills

## 2024-06-15 NOTE — Assessment & Plan Note (Signed)
-   Patient states that his depression is well-controlled today and has a PHQ-9 score of 3 -He is compliant with the Cymbalta  60 mg daily as well as his Wellbutrin  150 mg daily -He follows up with a therapist and states that he is doing well with this -No further workup at this time -Denies any other complaints at this time

## 2024-06-16 ENCOUNTER — Ambulatory Visit (HOSPITAL_COMMUNITY): Payer: Self-pay | Admitting: Clinical

## 2024-06-16 ENCOUNTER — Ambulatory Visit: Payer: Self-pay | Admitting: Internal Medicine

## 2024-06-16 ENCOUNTER — Encounter (HOSPITAL_COMMUNITY): Payer: Self-pay | Admitting: Clinical

## 2024-06-16 DIAGNOSIS — F330 Major depressive disorder, recurrent, mild: Secondary | ICD-10-CM

## 2024-06-16 DIAGNOSIS — F411 Generalized anxiety disorder: Secondary | ICD-10-CM

## 2024-06-16 NOTE — Telephone Encounter (Signed)
 Patient returned call

## 2024-06-16 NOTE — Progress Notes (Signed)
 THERAPIST PROGRESS NOTE  Session Time:  1000am-11:00am   Session #18  Virtual Visit via Video Note  I connected with Cory Gomez on 06/16/24 at 10:00 AM EDT by a video enabled telemedicine application and verified that I am speaking with the correct person using two identifiers.  Location: Patient: community in car in parking lot Provider: home office   I discussed the limitations of evaluation and management by telemedicine and the availability of in person appointments. The patient expressed understanding and agreed to proceed.   I discussed the assessment and treatment plan with the patient. The patient was provided an opportunity to ask questions and all were answered. The patient agreed with the plan and demonstrated an understanding of the instructions.   The patient was advised to call back or seek an in-person evaluation if the symptoms worsen or if the condition fails to improve as anticipated.  I provided 60 minutes of non-face-to-face time during this encounter.  Cory JINNY Crest, LCSW   Participation Level: Active  Behavioral Response: Casual Alert Euthymic and a little odwn   Type of Therapy: Individual Therapy  Treatment Goals Addressed:  STG: Cory Gomez will reduce frequency of avoidant behaviors by 50% as evidenced by self-report in therapy sessions  LTG: Learn a variety of coping skills and demonstrate the ability to use them to decrease feelings of sadness, anger, and fear and increase feelings of happiness, peace, and powerfulness AEB gauging those emotions on 1-10 scale.  LTG: Learn breathing techniques and grounding techniques at an age-appropriate level and demonstrate mastery in session then report independent use of these skills out of session.  LTG: Explore personal core beliefs, rules and assumptions, and cognitive distortions through therapist using Cognitive Behavioral Therapy; learn how to develop replacement thoughts and challenge  unhelpful thoughts. STG: Score less than 9 on the PHQ-9 and less than 5 on the GAD-7 as evidenced by intermittent administration of the questionnaires to determine progress in managing depression and anxiety.  LTG: Learn and practice communication techniques such as active listening, I statements, open-ended questions, reflective listening, assertiveness, fair fighting rules, initiating conversations, and more as necessary and taught in session.  LTG: Learn about boundary styles and types, how to implement them, and how to enforce them so that feels more empowered and content with being able to maintain more helpful, appropriate boundaries in the future for a more balanced result. LTG: Work to Arts development officer from models like CBT, Stages of Change, DBT, shame resilience theory, ACT, SFBT, MI, trauma-informed therapy and others to be able to manage mental health symptoms, AEB practicing out of session and reporting back.  STG: Improve self-esteem by engaging in daily affirmations, developing new skills, gratitude journaling, use of SMART goals, increased assertiveness, challenging negative beliefs, and focusing on what patient can control   LTG: Increase coping skills to manage depression/anxiety/anger and improve ability to perform daily activities as evidenced by fewer depressive episodes and/or anger outbursts and/or anxious days per self-report and collateral information.   ProgressTowards Goals: Progressing  Interventions: CBT, Assertiveness Training, Psychosocial Skills: communication, and Supportive   Summary: Cory Gomez is a 43 y.o. male who presents with MDD/severe, GAD, insomnia, relationship problems with his wife due to her constantly leaving jobs, and recent completion of cancer treatments.  He presented oriented x5 and stated he was feeling a little disappointed.  CSW evaluated patient's medication compliance, use of coping tools, and self-care, as applicable.  He provided  an update on  various aspects of his life that are normally discussed in therapy, including that the doctor has added another 3 months for him to stay out of work to get his neuropathy under control.  This is due to tingling and numbness on his right side that sometimes in his inability to grip things in his right hand.  We talked about how much faith he is putting in his doctors, which can be difficult because he does not understand the science behind it and they just throw another medicine at it.  He stated he is quite frustrated but his hygiene has remained good and he does not feel he is slipping into depression.  CSW suggested some things for behavioral activation such as local parks.  He has been walking the dogs more and meeting people on those walks.  He disclosed having some resentment toward his wife because he does the cooking and cleaning, but then she will not help with keeping things clean.  He has asked her using You terms, was reminded and shown how I terms could be more effective.  He did use I phrases to ask her to return to therapy.  He also disclosed that he has now lost his insurance and cannot afford the $700 COBRA fee, so has applied for Medicaid.  As we discussed progress with his diet and exercise, he was happy to report that he has seen his PCP with labs done, showing that his A1C went from 6.7 to 5.6, was very excited in talking about this.  Much of the rest of session was spent in taking about his ongoing grief over father who died suddenly in 2007-07-07 on Thanksgiving Day.  This loss has made the holidays difficult for him.  He further shared that his paternal grandmother was on life support at the time of father's death, so a week later, he and his younger brothers were called on at very young ages to make the decision to take her off the life support machines.  At the time they felt like they were telling the hospital to kill her.  He now sees it differently, but it is still a  difficult thing to handle.  He stated that he is glad his health is improving because he does not want to die soon, has things he wants to do.  He made a list of these for CSW, such as travel especially to [place], go on another cruise, step out of his comfort zone, take his wife to [employer].  He was encouraged to keep thinking about the things he wants to accomplish.  Suicidal/Homicidal: No without intent/plan  Therapist Response: Patient is progressing AEB engaging in scheduled therapy session.  Throughout the session, CSW gave patient the opportunity to explore thoughts and feelings associated with current life situations and past/present stressors.   CSW challenged patient gently and appropriately to consider different ways of looking at reported issues. CSW encouraged patient's expression of feelings and validated these using empathy, active listening, open body language, and unconditional positive regard.       Plan/Recommendations:  Return to therapy as scheduled on 7/17, continue to engage in self care especially that related to nutrition and fitness, try getting in contact with jobs he has applied for, speak to wife in I statements to ask her to help him clean the house  Diagnosis:  Encounter Diagnoses  Name Primary?   Major depressive disorder, recurrent episode, mild (HCC) Yes   GAD (generalized anxiety disorder)  Collaboration of Care: Psychiatrist AEB - psychiatrist and therapist both have access to notes in Epic  Patient/Guardian was advised Release of Information must be obtained prior to any record release in order to collaborate their care with an outside provider. Patient/Guardian was advised if they have not already done so to contact the registration department to sign all necessary forms in order for us  to release information regarding their care.   Consent: Patient/Guardian gives verbal consent for treatment and assignment of benefits for services provided during this  visit. Patient/Guardian expressed understanding and agreed to proceed.   Cory JINNY Crest, LCSW 06/16/2024

## 2024-06-17 ENCOUNTER — Other Ambulatory Visit: Payer: Self-pay

## 2024-06-27 LAB — SIGNATERA
SIGNATERA MTM READOUT: 0 MTM/ml
SIGNATERA TEST RESULT: NEGATIVE

## 2024-06-29 ENCOUNTER — Ambulatory Visit (HOSPITAL_COMMUNITY)
Admission: RE | Admit: 2024-06-29 | Discharge: 2024-06-29 | Disposition: A | Discharge status: Home / Self Care | Payer: Self-pay | Source: Ambulatory Visit | Attending: Nurse Practitioner | Admitting: Nurse Practitioner

## 2024-06-29 DIAGNOSIS — C187 Malignant neoplasm of sigmoid colon: Secondary | ICD-10-CM | POA: Insufficient documentation

## 2024-06-29 MED ORDER — IOHEXOL 300 MG/ML  SOLN
100.0000 mL | Freq: Once | INTRAMUSCULAR | Status: AC | PRN
  Administered 2024-06-29: 100 mL via INTRAVENOUS

## 2024-06-29 MED ORDER — IOHEXOL 9 MG/ML PO SOLN
500.0000 mL | ORAL | Status: AC
  Administered 2024-06-29 (×2): 500 mL via ORAL

## 2024-07-02 ENCOUNTER — Encounter (HOSPITAL_COMMUNITY): Payer: Self-pay

## 2024-07-04 DIAGNOSIS — Z419 Encounter for procedure for purposes other than remedying health state, unspecified: Secondary | ICD-10-CM | POA: Diagnosis not present

## 2024-07-05 ENCOUNTER — Encounter: Payer: Self-pay | Admitting: Hematology

## 2024-07-09 ENCOUNTER — Ambulatory Visit (HOSPITAL_COMMUNITY): Admitting: Clinical

## 2024-07-09 ENCOUNTER — Encounter (HOSPITAL_COMMUNITY): Payer: Self-pay | Admitting: Clinical

## 2024-07-09 DIAGNOSIS — F330 Major depressive disorder, recurrent, mild: Secondary | ICD-10-CM

## 2024-07-09 DIAGNOSIS — F411 Generalized anxiety disorder: Secondary | ICD-10-CM

## 2024-07-09 NOTE — Progress Notes (Signed)
 THERAPIST PROGRESS NOTE  Session Time:  8:00am-8:59am   Session #19  Virtual Visit via Video Note  I connected with Levander Wonda PONCE Melburn on 07/09/24 at  8:00 AM EDT by a video enabled telemedicine application and verified that I am speaking with the correct person using two identifiers.  Location: Patient: home Provider: [facility] outpatient therapy office - [place]    I discussed the limitations of evaluation and management by telemedicine and the availability of in person appointments. The patient expressed understanding and agreed to proceed.   I discussed the assessment and treatment plan with the patient. The patient was provided an opportunity to ask questions and all were answered. The patient agreed with the plan and demonstrated an understanding of the instructions.   The patient was advised to call back or seek an in-person evaluation if the symptoms worsen or if the condition fails to improve as anticipated.  I provided 59 minutes of non-face-to-face time during this encounter.  [name], LCSW   Participation Level: Active  Behavioral Response: Casual Alert Euthymic   Type of Therapy: Individual Therapy  Treatment Goals Addressed:  STG: Marquize [name] will reduce frequency of avoidant behaviors by 50% as evidenced by self-report in therapy sessions  LTG: Learn a variety of coping skills and demonstrate the ability to use them to decrease feelings of sadness, anger, and fear and increase feelings of happiness, peace, and powerfulness AEB gauging those emotions on 1-10 scale.  LTG: Learn breathing techniques and grounding techniques at an age-appropriate level and demonstrate mastery in session then report independent use of these skills out of session.  LTG: Explore personal core beliefs, rules and assumptions, and cognitive distortions through therapist using Cognitive Behavioral Therapy; learn how to develop replacement thoughts and challenge unhelpful  thoughts. STG: Score less than 9 on the PHQ-9 and less than 5 on the GAD-7 as evidenced by intermittent administration of the questionnaires to determine progress in managing depression and anxiety.  LTG: Learn and practice communication techniques such as active listening, I statements, open-ended questions, reflective listening, assertiveness, fair fighting rules, initiating conversations, and more as necessary and taught in session.  LTG: Learn about boundary styles and types, how to implement them, and how to enforce them so that feels more empowered and content with being able to maintain more helpful, appropriate boundaries in the future for a more balanced result. LTG: Work to Arts development officer from models like CBT, Stages of Change, DBT, shame resilience theory, ACT, SFBT, MI, trauma-informed therapy and others to be able to manage mental health symptoms, AEB practicing out of session and reporting back.  STG: Improve self-esteem by engaging in daily affirmations, developing new skills, gratitude journaling, use of SMART goals, increased assertiveness, challenging negative beliefs, and focusing on what patient can control   LTG: Increase coping skills to manage depression/anxiety/anger and improve ability to perform daily activities as evidenced by fewer depressive episodes and/or anger outbursts and/or anxious days per self-report and collateral information.   ProgressTowards Goals: Progressing  Interventions: Conservator, museum/gallery, Supportive, and Other: grief counseling   Summary: Bonifacio Pruden is a 43 y.o. male who presents with MDD/severe, GAD, insomnia, relationship problems with his wife due to her constantly leaving jobs, and recent completion of cancer treatments.  He presented oriented x5 and stated he was feeling rough.  CSW evaluated patient's medication compliance, use of coping tools, and self-care, as applicable.  He provided an update on various aspects of his life  that  are normally discussed in therapy, including the death of his dog, a recent CT scan, and his wife.  He stated he is sleeping great and is no longer in need of Lunesta .  When asked why he thinks his sleep quality has changed so significantly, he stated it is because of things he has learned in therapy, particularly to differentiate between what he can control and what he cannot control.  He was very saddened by the death of his dog of 8 months, Dexter, who had multiple medical issues.  We processed this at length to give meaning to the events.  He has not completely given up exercise in the midst of the current heat and thunderstorms, but continues to try to walk at night if it is not too hot.  He was happy to report he has been approved for Medicaid, retroactive to when he lost his insurance.  He had a CT scan and understands there is some type of spot on one of his lungs, is somewhat anxious about it, but also able to reason that if it was very serious, his doctor would want to see him immediately rather than waiting a full 2 months to the next appointment.  He has used the recommended communication methods to speak with wife about helping to clean and this has been effective, particularly setting time limits and then offering themselves a reward when the goal is accomplished.  Most of the session was spent in discussing these items, with a focus on exploring his resilience that is shown through his changed attitude.  Suicidal/Homicidal: No without intent/plan  Therapist Response: Patient is progressing AEB engaging in scheduled therapy session.  Throughout the session, CSW gave patient the opportunity to explore thoughts and feelings associated with current life situations and past/present stressors.   CSW challenged patient gently and appropriately to consider different ways of looking at reported issues. CSW encouraged patient's expression of feelings and validated these using empathy, active  listening, open body language, and unconditional positive regard.   We discussed     Plan/Recommendations:  Return to therapy as scheduled on 8/7, continue to work on his grief, allowing himself to feel bad if needed about his dog  Diagnosis:  Encounter Diagnoses  Name Primary?   Major depressive disorder, recurrent episode, mild (HCC) Yes   GAD (generalized anxiety disorder)    Collaboration of Care: Psychiatrist AEB - psychiatrist and therapist both have access to notes in Epic  Patient/Guardian was advised Release of Information must be obtained prior to any record release in order to collaborate their care with an outside provider. Patient/Guardian was advised if they have not already done so to contact the registration department to sign all necessary forms in order for us  to release information regarding their care.   Consent: Patient/Guardian gives verbal consent for treatment and assignment of benefits for services provided during this visit. Patient/Guardian expressed understanding and agreed to proceed.   [name], LCSW 07/09/2024

## 2024-07-10 ENCOUNTER — Other Ambulatory Visit: Payer: Self-pay

## 2024-07-15 ENCOUNTER — Ambulatory Visit: Payer: Self-pay | Admitting: Nurse Practitioner

## 2024-07-22 NOTE — Progress Notes (Unsigned)
 BH MD/PA/NP OP Progress Note  07/23/2024 12:11 PM Cory Gomez  MRN:  [id number]  Visit Diagnosis:    ICD-10-CM   1. Major depressive disorder, recurrent episode, mild (HCC)  F33.0 DULoxetine  (CYMBALTA ) 60 MG capsule    buPROPion  (WELLBUTRIN  XL) 150 MG 24 hr tablet    2. GAD (generalized anxiety disorder)  F41.1 DULoxetine  (CYMBALTA ) 60 MG capsule      [name] is a 43 y.o. male with a history of MDD, GAD, ADHD, binge eating disorder, colon cancer stage 3b in treatment, and Vit D deficiency who presented to [facility] for initial evaluation on 01/15/2023.  At initial evaluation patient reported neurovegetative symptoms of depression including low mood, anhedonia, amotivation, worthlessness, insomnia, increased appetite, and occasional passive SI.  He denied any intent or plan.  Patient was able to complete a safety plan and lists his mother as a good support.  Patient furthermore endorsed symptoms of anxiety including feeling nervous or on edge, being unable to stop or control his worrying, difficulty relaxing, increased irritability, restlessness, and fears that something awful might happen.  Psychosocially patient has increased stress from financial struggles, difficulty at work, and interpersonal struggles in his marriage.  Patient meets criteria for MDD and generalized anxiety disorder with further clarity needed to rule out ADHD.  He was referred for neuropsych testing.  Of note due to patient's sleep troubles and body habitus there is some concern for sleep apnea and he was referred to a sleep specialist.  Cory Gomez presents for follow-up evaluation. Today, 07/23/24, patient reports that mood and anxiety have both been stable with some increase in irritability/frustration.  These symptoms are more situational related to the difficulty in finding work and lack of response from employers.  Patient has been handling  this relatively well with coping skills.  We will continue on current regimen and follow up in 2 months.  Psychotherapeutic interventions were used during today's session. From 11:36 AM to 11:59 AM. Therapeutic interventions included empathic listening, supportive therapy, cognitive and behavioral therapy, motivational interviewing. Used supportive interviewing techniques to provide emotional validation. Worked on cognitive reframing techniques and recommendations made for behavioral activation.  Improvement was evidenced by patient's participation and identified commitment to therapy goals.   Plan: - Continue Cymbalta  to 60 mg BID - Continue Wellbutrin  XL 150 mg every day - Conitnue propranolol  10 mg BID prn for anxiety - Continue Lunesta  2 mg QHS as needed for insomnia, last filled prescription was in September 2024 - Neuropsych referral - Sleep specialist appointment delayed until cancer piece is resolved  - Continue therapy with Elgie Crest twice a month - Going to support groups for people with cancer - Crisis resources reviewed - Follow up in 2 months  Chief Complaint:  Chief Complaint  Patient presents with   Follow-up   HPI: Cory Gomez presents reporting that he is doing fine, more or less same old same old the past couple months. He is still a bit tired but has recovered a large deal.  A recent scan showed no signs of colon cancer.  There was a small nodule on his lung however the provider was not concerned by it.  She had denies any significant anxiety related to this.  Overall he reports things have been stable in regards to his mood.  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 He had a couple of interviews however did not get the job for any of them. 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 Cory Gomez is frustrated with the lack of responses from his applications or hearing back from  employers.  Empathic listening techniques were used and support was provided. Discussed how certain things are outside of our control and what we can control are our own actions and responses. Cory Gomez agrees and reports that he plans to continue forward with applying for jobs.  Medication wise he continues to take the Wellbutrin  and Cymbalta  consistently denying any adverse side effects.  He finds these helpful and while he ultimately would like to try and taper off the medications he would prefer to wait until after he is stable with the job.  The propranolol  he uses intermittently when he has increased anxiety or has an upcoming doctor's appointment.  Patient reports that he still has several pill bottles left over from previous refills.  He has not taken Lunesta  in 4 months.  Past Psychiatric History: First episode of depression was in early elementary school after his parents divorced. He was in counseling and prescribed bupropion  then. Later, as an adult, he was briefly on fluoxetine with no clear benefit. While in school he was diagnosed with having ADHD inattentive type and was on Ritalin. He had seen Dr. Lynnetta from 2020-2022. He has no hx of inpatient psychiatric admissions, no hx of mania or overt psychosis. Denies any prior suicide attempts.   Has tried Adderall , Vyvanse , Ritalin, bupropion , Prozac, Zoloft , Cymbalta , trazodone , Zolpidem , topirimate, Lunesta    He does not abuse alcohol or street drugs.  Past Medical History:  Past Medical History:  Diagnosis Date   ADHD (attention deficit hyperactivity disorder)    Annual physical exam 09/09/2019   Anxiety and depression    Cancer (HCC)    Colon cancer (HCC)    COVID-19    12/28/19   Depression, recurrent (HCC) 09/09/2019   Headache 12/31/2021   History of kidney stones    Hypertriglyceridemia 07/17/2021   Insomnia    Inverted nipple 09/16/2019   Kidney stone    Mastoiditis of both sides    Nystagmus 01/22/2022   Obesity (BMI  30-39.9)    Obesity (BMI 30-39.9)    Perforated sigmoid colon (HCC) 07/06/2023   Suicidal ideation 07/14/2021   Vertigo     Past Surgical History:  Procedure Laterality Date   COLON RESECTION  09/25/2023   PORTACATH PLACEMENT Right 10/17/2023   Procedure: INSERTION PORT-A-CATH WITH MATILDA GUIDANCE;  Surgeon: Sheldon Standing, MD;  Location: [facility];  Service: General;  Laterality: Right;   PROCTOSCOPY N/A 09/25/2023   Procedure: RIGID PROCTOSCOPY;  Surgeon: Sheldon Standing, MD;  Location: [facility];  Service: General;  Laterality: N/A;    Family History:  Family History  Problem Relation Age of Onset   Diabetes Mother    Hypertension Mother    Hyperlipidemia Mother    Diabetes Mellitus II Mother    Anxiety disorder Mother    Depression Mother    Basal cell carcinoma Mother 28 - 41   Stroke Father    Alcoholism Father    Anxiety disorder Brother    Depression Brother    Hypertension Brother    Hyperlipidemia Brother    Diabetes type II Brother    ADD / ADHD Brother    Squamous cell carcinoma Maternal Grandmother    Dementia Maternal Grandfather    Basal  cell carcinoma Maternal Grandfather    Prostate cancer Maternal Grandfather 30   Lung cancer Paternal Grandfather 30       smoked   Stomach cancer Neg Hx    Colon cancer Neg Hx    Esophageal cancer Neg Hx    Pancreatic cancer Neg Hx     Social History:  Social History   Socioeconomic History   Marital status: Married    Spouse name: Not on file   Number of children: Not on file   Years of education: Not on file   Highest education level: Not on file  Occupational History   Not on file  Tobacco Use   Smoking status: Never   Smokeless tobacco: Never  Vaping Use   Vaping status: Never Used  Substance and Sexual Activity   Alcohol use: Never   Drug use: Never   Sexual activity: Yes    Partners: Female  Other Topics Concern   Not on file  Social History Narrative   Married no kids wife is  Reco Shonk    -wife and mother Luke Paling on [place]      Has siblings    Works Educational psychologist IT [employer]   1 dog    Social Drivers of Corporate investment banker Strain: High Risk (10/11/2023)   Overall Financial Resource Strain (CARDIA)    Difficulty of Paying Living Expenses: Very hard  Food Insecurity: No Food Insecurity (01/08/2024)   Hunger Vital Sign    Worried About Running Out of Food in the Last Year: Never true    Ran Out of Food in the Last Year: Never true  Transportation Needs: No Transportation Needs (01/08/2024)   PRAPARE - Administrator, Civil Service (Medical): No    Lack of Transportation (Non-Medical): No  Physical Activity: Not on file  Stress: Not on file  Social Connections: Moderately Integrated (01/08/2024)   Social Connection and Isolation Panel    Frequency of Communication with Friends and Family: More than three times a week    Frequency of Social Gatherings with Friends and Family: Three times a week    Attends Religious Services: More than 4 times per year    Active Member of Clubs or Organizations: No    Attends Banker Meetings: Never    Marital Status: Married    Allergies:  Allergies  Allergen Reactions   Ambien  [Zolpidem ] Other (See Comments)    Pt requests we do not give this medication.     Current Medications: Current Outpatient Medications  Medication Sig Dispense Refill   Acetaminophen  (TYLENOL  PO) Take 500 mg by mouth every 6 (six) hours.     buPROPion  (WELLBUTRIN  XL) 150 MG 24 hr tablet Take 1 tablet (150 mg total) by mouth every morning. 30 tablet 2   DULoxetine  (CYMBALTA ) 60 MG capsule Take 1 capsule (60 mg total) by mouth 2 (two) times daily. 60 capsule 2   eszopiclone  (LUNESTA ) 2 MG TABS tablet Take 1 tablet (2 mg total) by mouth at bedtime as needed for sleep. Take immediately before bedtime 30 tablet 1   lidocaine -prilocaine  (EMLA ) cream Apply to affected area once (Patient taking differently: Apply 1  Application topically as needed (for chemo port, apply three hours before treatment).) 30 g 3   NEURONTIN  100 MG capsule Take 1 capsule at bedtime when needed. May increase to bid as needed and as tolerated 60 capsule 1   ondansetron  (ZOFRAN ) 8 MG tablet Take 1  tablet (8 mg total) by mouth every 8 (eight) hours as needed for nausea or vomiting. 30 tablet 1   prochlorperazine  (COMPAZINE ) 10 MG tablet Take 1 tablet (10 mg total) by mouth every 6 (six) hours as needed for nausea or vomiting. (Patient taking differently: Take 10 mg by mouth every 6 (six) hours as needed for nausea or vomiting. Have not started) 30 tablet 1   propranolol  (INDERAL ) 10 MG tablet TAKE 1 TABLET(10 MG) BY MOUTH TWICE DAILY AS NEEDED 60 tablet 2   Vitamin D , Ergocalciferol , (DRISDOL ) 1.25 MG (50000 UNIT) CAPS capsule Take 1 capsule (50,000 Units total) by mouth every 7 (seven) days. 12 capsule 3   No current facility-administered medications for this visit.     Psychiatric Specialty Exam: Review of Systems  There were no vitals taken for this visit.There is no height or weight on file to calculate BMI.  General Appearance: Well Groomed  Eye Contact:  Good  Speech:  Clear and Coherent and Normal Rate  Volume:  Normal  Mood:  Euthymic  Affect:  Congruent  Thought Process:  Coherent and Goal Directed  Orientation:  Full (Time, Place, and Person)  Thought Content: Logical and Rumination   Suicidal Thoughts:  No  Homicidal Thoughts:  No  Memory:  NA  Judgement:  Fair  Insight:  Fair  Psychomotor Activity:  Normal  Concentration:  Concentration: Good  Recall:  Good  Fund of Knowledge: Fair  Language: Good  Akathisia:  NA    AIMS (if indicated): not done  Assets:  Passenger transport manager Vocational/Educational  ADL's:  Intact  Cognition: WNL  Sleep:  Good   Metabolic Disorder Labs: Lab Results  Component Value Date   HGBA1C 5.9 06/15/2024   MPG 119.76 01/08/2024    MPG 116.89 09/25/2023   No results found for: PROLACTIN Lab Results  Component Value Date   CHOL 182 06/04/2024   TRIG 267 (H) 06/04/2024   HDL 30 (L) 06/04/2024   CHOLHDL 6.1 (H) 06/04/2024   VLDL 73 (H) 01/08/2024   LDLCALC 106 (H) 06/04/2024   LDLCALC 46 01/08/2024   Lab Results  Component Value Date   TSH 1.160 01/08/2024   TSH 3.16 05/08/2023    Therapeutic Level Labs: No results found for: LITHIUM No results found for: VALPROATE No results found for: CBMZ   Screenings: GAD-7    Flowsheet Row Office Visit from 06/15/2024 in [facility] from 06/02/2024 in [facility] Visit from 01/27/2024 in [facility] Visit from 07/18/2023 in [facility] Visit from 05/29/2023 in [facility]  Total GAD-7 Score 4 3 14 13 6    PHQ2-9    Flowsheet Row Office Visit from 06/15/2024 in [facility] from 06/02/2024 in [facility] Visit from 01/27/2024 in [facility] Visit from 07/18/2023 in [facility] Visit from 05/29/2023 in [facility] [employer]  PHQ-2 Total Score 1 2 3 2 2   PHQ-9 Total Score 3 3 12 7 10    Flowsheet Row ED to Hosp-Admission (Discharged) from 01/08/2024 in [facility] Admission (Discharged) from 10/17/2023 in [facility] Admission (Discharged) from 09/25/2023 in [facility]  C-SSRS RISK CATEGORY No Risk No Risk No Risk    Collaboration of Care: Collaboration of Care: Medication Management AEB medication prescription, Other provider involved in patient's care AEB oncology  chart review, and Referral or follow-up with counselor/therapist AEB chart review  Patient/Guardian was advised Release of Information must be obtained prior to any record release in order to collaborate their care with an outside provider. Patient/Guardian was advised if they have not already done so to contact the registration department to sign all necessary forms in order for us  to release information regarding their care.   Consent: Patient/Guardian gives verbal consent for treatment and assignment of benefits for services provided during this visit. Patient/Guardian expressed understanding and agreed to proceed.    Arvella CHRISTELLA Finder, MD 07/23/2024, 12:11 PM   Virtual Visit via Video Note  I connected with Cory Gomez on 07/23/24 at 11:30 AM EDT by a video enabled telemedicine application and verified that I am speaking with the correct person using two identifiers.  Location: Patient: Home Provider: Home Office   I discussed the limitations of evaluation and management by telemedicine and the availability of in person appointments. The patient expressed understanding and agreed to proceed.   I discussed the assessment and treatment plan with the patient. The patient was provided an opportunity to ask questions and all were answered. The patient agreed with the plan and demonstrated an understanding of the instructions.   The patient was advised to call back or seek an in-person evaluation if the symptoms worsen or if the condition fails to improve as anticipated.  I provided 30 minutes of non-face-to-face time during this encounter.   Arvella CHRISTELLA Finder, MD

## 2024-07-23 ENCOUNTER — Encounter (HOSPITAL_COMMUNITY): Payer: Self-pay | Admitting: Psychiatry

## 2024-07-23 ENCOUNTER — Telehealth (HOSPITAL_COMMUNITY): Admitting: Psychiatry

## 2024-07-23 DIAGNOSIS — F330 Major depressive disorder, recurrent, mild: Secondary | ICD-10-CM

## 2024-07-23 DIAGNOSIS — F411 Generalized anxiety disorder: Secondary | ICD-10-CM | POA: Diagnosis not present

## 2024-07-23 MED ORDER — BUPROPION HCL ER (XL) 150 MG PO TB24
150.0000 mg | ORAL_TABLET | ORAL | 2 refills | Status: DC
Start: 2024-07-23 — End: 2024-10-08

## 2024-07-23 MED ORDER — DULOXETINE HCL 60 MG PO CPEP: 60.0000 mg | ORAL_CAPSULE | Freq: Two times a day (BID) | ORAL | 2 refills | Status: DC

## 2024-07-24 ENCOUNTER — Other Ambulatory Visit: Payer: Self-pay

## 2024-07-30 ENCOUNTER — Ambulatory Visit (HOSPITAL_COMMUNITY): Admitting: Clinical

## 2024-07-30 ENCOUNTER — Telehealth (HOSPITAL_COMMUNITY): Admitting: Psychiatry

## 2024-07-30 ENCOUNTER — Encounter (HOSPITAL_COMMUNITY): Payer: Self-pay | Admitting: Clinical

## 2024-07-30 DIAGNOSIS — F411 Generalized anxiety disorder: Secondary | ICD-10-CM | POA: Diagnosis not present

## 2024-07-30 DIAGNOSIS — F330 Major depressive disorder, recurrent, mild: Secondary | ICD-10-CM

## 2024-07-30 NOTE — Progress Notes (Signed)
 THERAPIST PROGRESS NOTE  Session Time:  10:12am-11:01am   Session #19  Participation Level: Active  Behavioral Response: Casual Alert Euthymic   Type of Therapy: Individual Therapy  Treatment Goals Addressed:  STG: Cory Gomez will reduce frequency of avoidant behaviors by 50% as evidenced by self-report in therapy sessions  LTG: Learn a variety of coping skills and demonstrate the ability to use them to decrease feelings of sadness, anger, and fear and increase feelings of happiness, peace, and powerfulness AEB gauging those emotions on 1-10 scale.  LTG: Learn breathing techniques and grounding techniques at an age-appropriate level and demonstrate mastery in session then report independent use of these skills out of session.  LTG: Explore personal core beliefs, rules and assumptions, and cognitive distortions through therapist using Cognitive Behavioral Therapy; learn how to develop replacement thoughts and challenge unhelpful thoughts. STG: Score less than 9 on the PHQ-9 and less than 5 on the GAD-7 as evidenced by intermittent administration of the questionnaires to determine progress in managing depression and anxiety.  LTG: Learn and practice communication techniques such as active listening, I statements, open-ended questions, reflective listening, assertiveness, fair fighting rules, initiating conversations, and more as necessary and taught in session.  LTG: Learn about boundary styles and types, how to implement them, and how to enforce them so that feels more empowered and content with being able to maintain more helpful, appropriate boundaries in the future for a more balanced result. LTG: Work to Arts development officer from models like CBT, Stages of Change, DBT, shame resilience theory, ACT, SFBT, MI, trauma-informed therapy and others to be able to manage mental health symptoms, AEB practicing out of session and reporting back.  STG: Improve self-esteem by engaging in daily  affirmations, developing new skills, gratitude journaling, use of SMART goals, increased assertiveness, challenging negative beliefs, and focusing on what patient can control   LTG: Increase coping skills to manage depression/anxiety/anger and improve ability to perform daily activities as evidenced by fewer depressive episodes and/or anger outbursts and/or anxious days per self-report and collateral information.   ProgressTowards Goals: Progressing  Interventions: Supportive and Other: exploration of how cancer has changed the way he approaches his life   Summary: Cory Gomez is a 43 y.o. male who presents with MDD/severe, GAD, insomnia, relationship problems with his wife due to her constantly leaving jobs, and recent completion of cancer treatments.  He presented oriented x5 and stated he was feeling good, I have been released to go back to work except I cannot work in [employer].  CSW evaluated patient's medication compliance, use of coping tools, and self-care, as applicable.  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  All of these issues were discussed in full and he was given positive feedback for how he is handling everything in such a positive fashion.  Suicidal/Homicidal: No without intent/plan  Therapist Response: Patient is progressing AEB engaging in scheduled therapy session.  Throughout the session, CSW gave patient the opportunity to explore thoughts and feelings associated with current life situations and past/present stressors.   CSW challenged patient gently and appropriately to consider different ways  of  looking at reported issues. CSW encouraged patient's expression of feelings and validated these using empathy, active listening, open body language, and unconditional positive regard.   We discussed     Plan/Recommendations:  Return to therapy as scheduled on 8/28, continue to employ his CBT skills and breathing skills as needed, particularly in his job search.  Diagnosis:  Encounter Diagnoses  Name Primary?   Major depressive disorder, recurrent episode, mild (HCC) Yes   GAD (generalized anxiety disorder)     Collaboration of Care: Psychiatrist AEB - psychiatrist and therapist both have access to notes in Epic  Patient/Guardian was advised Release of Information must be obtained prior to any record release in order to collaborate their care with an outside provider. Patient/Guardian was advised if they have not already done so to contact the registration department to sign all necessary forms in order for us  to release information regarding their care.   Consent: Patient/Guardian gives verbal consent for treatment and assignment of benefits for services provided during this visit. Patient/Guardian expressed understanding and agreed to proceed.   [name], LCSW 07/30/2024

## 2024-08-04 DIAGNOSIS — Z419 Encounter for procedure for purposes other than remedying health state, unspecified: Secondary | ICD-10-CM | POA: Diagnosis not present

## 2024-08-20 ENCOUNTER — Ambulatory Visit (INDEPENDENT_AMBULATORY_CARE_PROVIDER_SITE_OTHER): Admitting: Clinical

## 2024-08-20 ENCOUNTER — Encounter (HOSPITAL_COMMUNITY): Payer: Self-pay | Admitting: Clinical

## 2024-08-20 DIAGNOSIS — F330 Major depressive disorder, recurrent, mild: Secondary | ICD-10-CM | POA: Diagnosis not present

## 2024-08-20 DIAGNOSIS — F411 Generalized anxiety disorder: Secondary | ICD-10-CM

## 2024-08-20 NOTE — Progress Notes (Signed)
 THERAPIST PROGRESS NOTE  Session Time:  1:06pm-2:108m   Session #20  Participation Level: Active  Behavioral Response: Casual Alert Euthymic   Type of Therapy: Individual Therapy  Treatment Goals Addressed:  STG: Cory Gomez will reduce frequency of avoidant behaviors by 50% as evidenced by self-report in therapy sessions  LTG: Learn a variety of coping skills and demonstrate the ability to use them to decrease feelings of sadness, anger, and fear and increase feelings of happiness, peace, and powerfulness AEB gauging those emotions on 1-10 scale.  LTG: Learn breathing techniques and grounding techniques at an age-appropriate level and demonstrate mastery in session then report independent use of these skills out of session.  LTG: Explore personal core beliefs, rules and assumptions, and cognitive distortions through therapist using Cognitive Behavioral Therapy; learn how to develop replacement thoughts and challenge unhelpful thoughts. STG: Score less than 9 on the PHQ-9 and less than 5 on the GAD-7 as evidenced by intermittent administration of the questionnaires to determine progress in managing depression and anxiety.  LTG: Learn and practice communication techniques such as active listening, I statements, open-ended questions, reflective listening, assertiveness, fair fighting rules, initiating conversations, and more as necessary and taught in session.  LTG: Learn about boundary styles and types, how to implement them, and how to enforce them so that feels more empowered and content with being able to maintain more helpful, appropriate boundaries in the future for a more balanced result. LTG: Work to Arts development officer from models like CBT, Stages of Change, DBT, shame resilience theory, ACT, SFBT, MI, trauma-informed therapy and others to be able to manage mental health symptoms, AEB practicing out of session and reporting back.  STG: Improve self-esteem by engaging in daily  affirmations, developing new skills, gratitude journaling, use of SMART goals, increased assertiveness, challenging negative beliefs, and focusing on what patient can control   LTG: Increase coping skills to manage depression/anxiety/anger and improve ability to perform daily activities as evidenced by fewer depressive episodes and/or anger outbursts and/or anxious days per self-report and collateral information.   ProgressTowards Goals: Progressing  Interventions: Assertiveness Training and Supportive   Summary: Cory Gomez is a 43 y.o. male who presents with MDD/severe, GAD, insomnia, relationship problems with his wife due to her constantly leaving jobs, and recent completion of cancer treatments.  He presented oriented x5 and stated he was feeling fine.  CSW evaluated patient's medication compliance, use of coping tools, and self-care, as applicable.  He provided an update on various aspects of his life that are normally discussed in therapy, including his recent move to a new home, his job search, his past history of conflict, and his religious beliefs.  Black mold was discovered in the home he shared with his wife and was spreading from inside the walls to under the flooring, so they quickly moved out after having the good fortune to find a home elsewhere that was available.  They have now moved from [place] to [place], so they are closer to their church, to her job, and to his mother.  It is a peaceful neighborhood and he feels very fortunate, got a lot of help from his church family in moving, having the utilities switched, and even in finding the home in the first place.  This led him to discuss how important his faith has been in his recovery from cancer.  This was discussed at length.  The move to a new home forced him and his wife to work  together toward a common goal in making decisions and packing, so they are getting along better.  She is remaining consistent in her current  job.  While he does not yet have another job offer, he continues to apply to various jobs.  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  He is hesitant to take a pay cut due to the rent on the new home being higher.  Patient also discussed how his size enabled him to stand up for his brother and himself in school, although he was picked on due to his ADHD.  He does not believe he still has ADHD, though, stating it no longer bothers him.  We explored the difference between conflict and confrontation and CSW used the styles of boundaries (porous, rigid, and healthy) to explain that conflict is natural while it does not have to be handled with confrontation or escalation.  Patient also talked at length about the power of prayer and how he believes that he felt it the night God entered his body to heal his cancer.  He was encouraged by CSW to use all these positive elements in his life to remain focused on his health and wellness.  He expressed great gratitude for help received while he has been in treatment.  Suicidal/Homicidal: No without intent/plan  Therapist Response: Patient is progressing AEB engaging in scheduled therapy session.  Throughout the session, CSW gave patient the opportunity to explore thoughts and feelings associated with current life situations and past/present stressors.   CSW challenged patient gently and appropriately to consider different ways of looking at reported issues. CSW encouraged patient's expression of feelings and validated these using empathy, active listening, open body language, and unconditional positive regard.       Plan/Recommendations:  Return to therapy as scheduled on 9/25, continue to use skills taught in session  Diagnosis:  Encounter Diagnoses  Name Primary?   Major depressive disorder, recurrent episode, mild (HCC) Yes   GAD (generalized anxiety disorder)    Collaboration of Care:  Psychiatrist AEB - psychiatrist and therapist both have access to notes in Epic  Patient/Guardian was advised Release of Information must be obtained prior to any record release in order to collaborate their care with an outside provider. Patient/Guardian was advised if they have not already done so to contact the registration department to sign all necessary forms in order for us  to release information regarding their care.   Consent: Patient/Guardian gives verbal consent for treatment and assignment of benefits for services provided during this visit. Patient/Guardian expressed understanding and agreed to proceed.   [name], LCSW 08/20/2024

## 2024-09-04 DIAGNOSIS — Z419 Encounter for procedure for purposes other than remedying health state, unspecified: Secondary | ICD-10-CM | POA: Diagnosis not present

## 2024-09-10 ENCOUNTER — Encounter: Payer: Self-pay | Admitting: Nurse Practitioner

## 2024-09-10 ENCOUNTER — Ambulatory Visit: Payer: Self-pay | Admitting: Nurse Practitioner

## 2024-09-10 ENCOUNTER — Other Ambulatory Visit: Payer: Self-pay | Admitting: Nurse Practitioner

## 2024-09-10 VITALS — BP 120/78 | HR 84 | Temp 97.9°F | Ht 74.0 in | Wt 307.4 lb

## 2024-09-10 DIAGNOSIS — E785 Hyperlipidemia, unspecified: Secondary | ICD-10-CM | POA: Diagnosis not present

## 2024-09-10 DIAGNOSIS — G620 Drug-induced polyneuropathy: Secondary | ICD-10-CM

## 2024-09-10 DIAGNOSIS — T451X5A Adverse effect of antineoplastic and immunosuppressive drugs, initial encounter: Secondary | ICD-10-CM

## 2024-09-10 DIAGNOSIS — C187 Malignant neoplasm of sigmoid colon: Secondary | ICD-10-CM | POA: Diagnosis not present

## 2024-09-10 DIAGNOSIS — E559 Vitamin D deficiency, unspecified: Secondary | ICD-10-CM

## 2024-09-10 DIAGNOSIS — R7303 Prediabetes: Secondary | ICD-10-CM

## 2024-09-10 DIAGNOSIS — I10 Essential (primary) hypertension: Secondary | ICD-10-CM | POA: Diagnosis not present

## 2024-09-10 DIAGNOSIS — F39 Unspecified mood [affective] disorder: Secondary | ICD-10-CM | POA: Diagnosis not present

## 2024-09-10 MED ORDER — GABAPENTIN 100 MG PO CAPS: 100.0000 mg | ORAL_CAPSULE | Freq: Every evening | ORAL | 2 refills | Status: DC | PRN

## 2024-09-10 NOTE — Progress Notes (Signed)
 Leron Glance, NP-C Phone: [phone]  Cory Gomez is a 43 y.o. male who presents today for transfer of care.   Discussed the use of AI scribe software for clinical note transcription with the patient, who gave verbal consent to proceed.  History of Present Illness   Cory Gomez is a 43 year old male with stage three colon cancer who presents for transfer of care and follow-up on a lung nodule.  He was diagnosed with stage three colon cancer in July of the previous year after surgery for diverticulitis revealed the cancer. He completed chemotherapy at the end of April. A CT scan in April showed no recurrence, but a surveillance CT in July revealed a 3-4 cm lung nodule. He is scheduled to see his oncologist tomorrow.  He experienced a reaction to chemotherapy that required hospitalization and observation. During this time, he was under the care of Dr. Onesimo, who was on call.  He is currently taking Wellbutrin , Cymbalta , and Lunesta  for mood and sleep, respectively. His sleep has been poor recently, and he plans to contact his psychiatrist for a refill of Lunesta . He also takes gabapentin  as needed for neuropathy resulting from chemotherapy, which he refilled through [facility].  He has a history of prediabetes and mild hyperlipidemia, with an LDL of 106 noted in June. He also has a history of low vitamin D  levels, which were noted during his cancer treatment, and he takes vitamin D  supplements weekly.  He reports a significant lifestyle change following his cancer diagnosis, having previously worked in [employer], which provided ample physical activity. He notes weight gain during treatment, contrary to expectations of weight loss.      Social History   Tobacco Use  Smoking Status Never  Smokeless Tobacco Never    Current Outpatient Medications on File Prior to Visit  Medication Sig Dispense Refill   Acetaminophen  (TYLENOL  PO) Take 500 mg by mouth every 6  (six) hours.     buPROPion  (WELLBUTRIN  XL) 150 MG 24 hr tablet Take 1 tablet (150 mg total) by mouth every morning. 30 tablet 2   DULoxetine  (CYMBALTA ) 60 MG capsule Take 1 capsule (60 mg total) by mouth 2 (two) times daily. 60 capsule 2   eszopiclone  (LUNESTA ) 2 MG TABS tablet Take 1 tablet (2 mg total) by mouth at bedtime as needed for sleep. Take immediately before bedtime 30 tablet 1   lidocaine -prilocaine  (EMLA ) cream Apply to affected area once (Patient taking differently: Apply 1 Application topically as needed (for chemo port, apply three hours before treatment).) 30 g 3   ondansetron  (ZOFRAN ) 8 MG tablet Take 1 tablet (8 mg total) by mouth every 8 (eight) hours as needed for nausea or vomiting. 30 tablet 1   prochlorperazine  (COMPAZINE ) 10 MG tablet Take 1 tablet (10 mg total) by mouth every 6 (six) hours as needed for nausea or vomiting. (Patient taking differently: Take 10 mg by mouth every 6 (six) hours as needed for nausea or vomiting. Have not started) 30 tablet 1   propranolol  (INDERAL ) 10 MG tablet TAKE 1 TABLET(10 MG) BY MOUTH TWICE DAILY AS NEEDED 60 tablet 2   Vitamin D , Ergocalciferol , (DRISDOL ) 1.25 MG (50000 UNIT) CAPS capsule Take 1 capsule (50,000 Units total) by mouth every 7 (seven) days. 12 capsule 3   No current facility-administered medications on file prior to visit.     ROS see history of present illness  Objective  Physical Exam Vitals:   09/10/24 9165 09/10/24 9144  BP: (!) 118/90 120/78  Pulse: 84   Temp: 97.9 F (36.6 C)   SpO2: 97%     BP Readings from Last 3 Encounters:  09/19/24 133/85  09/11/24 138/82  09/10/24 120/78   Wt Readings from Last 3 Encounters:  09/11/24 (!) 305 lb 12.8 oz (138.7 kg)  09/10/24 (!) 307 lb 6.4 oz (139.4 kg)  06/15/24 (!) 317 lb 9.6 oz (144.1 kg)    Physical Exam Constitutional:      General: He is not in acute distress.    Appearance: Normal appearance.  HENT:     Head: Normocephalic.  Cardiovascular:      Rate and Rhythm: Normal rate and regular rhythm.     Heart sounds: Normal heart sounds.  Pulmonary:     Effort: Pulmonary effort is normal.     Breath sounds: Normal breath sounds.  Skin:    General: Skin is warm and dry.  Neurological:     General: No focal deficit present.     Mental Status: He is alert.  Psychiatric:        Mood and Affect: Mood normal.        Behavior: Behavior normal.      Assessment/Plan: Please see individual problem list.  Chemotherapy-induced neuropathy Assessment & Plan: Neuropathy secondary to chemotherapy, managed with gabapentin . Ensure gabapentin  prescription is filled and available at pharmacy.   Cancer of sigmoid colon - pT4a, pN1a - s/p roboric LAR resection 09/27/2023 Assessment & Plan: Stage III colon cancer post resection and chemotherapy. Surveillance CT showed a new lung nodule, likely benign, under oncology evaluation. Follow up with oncology as scheduled. Continue surveillance with CT scans as recommended by oncology.  Orders: -     Gabapentin ; Take 1 capsule (100 mg total) by mouth at bedtime as needed. May increase to twice daily as needed.  Dispense: 60 capsule; Refill: 2  Hypertension, unspecified type Assessment & Plan: Blood pressure 118/90 mmHg, history of elevated readings normalizing after rest. Improvement noted on second reading. Stable. We will continue to monitor.    Mood disorder Assessment & Plan: Mood stable on current medication regimen. Continue Wellbutrin  and Cymbalta . Follow up with psychiatrist and therapist as scheduled.    Hyperlipidemia, unspecified hyperlipidemia type Assessment & Plan: Mildly elevated LDL cholesterol at 106 mg/dL, focusing on lifestyle changes. Encourage diet and exercise modifications to manage hyperlipidemia.   Prediabetes Assessment & Plan: Recent A1c of 5.9%, emphasizing lifestyle modifications. Encourage diet and exercise modifications to manage prediabetes.   Vitamin D   deficiency Assessment & Plan: Persistent deficiency confirmed by June 2025 labs. Continue high dose weekly supplementation.        Return in about 6 months (around 03/10/2025) for Follow up.   Leron Glance, NP-C [facility]

## 2024-09-10 NOTE — Progress Notes (Signed)
 Patient Care Team: Cory Gomez, Cory Gomez as PCP - General (Nurse Practitioner) Cory Standing, MD as Consulting Physician (General Surgery) Cory Norleen SAILOR, MD as Consulting Physician (Gastroenterology) Cory Callander, MD as Consulting Physician (Medical Oncology) Cory Eduard SAUNDERS, MD as Consulting Physician (Neurology) Cory Gerard LABOR, DO as Consulting Physician (Otolaryngology)  Clinic Day:  09/11/2024  Referring physician: Hope Merle, MD  ASSESSMENT & PLAN:   Assessment & Plan: Cancer of sigmoid colon - pT4a, pN1a - s/p roboric LAR resection 09/27/2023 -pT4aN1M0 stage IIIB, MMS, G2 -Presented with perforated diverticulitis in sigmoid colon in July, 2024. -Patient underwent laparoscopic resection of sigmoid colon. Path reviewed. There was moderate differentiated adenocarcinoma with 1 positive lymph nodes. -Adjuvant chemotherapy FOLFOX was recommended for 6 months. Oxaliplatin  was held from final 2 cycles of chemotherapy due to peripheral neuropathy.  -Surveillance CT scan on March 25, 2024 was negative for recurrence. -he completed chemotherapy on 04/15/2024.  -ctDNA signatera testing drawn 06/10/2024. Results were negative.  -surveillance CT CAP done 06/27/2024.  There is no thoracic adenopathy.  Single, small left pulmonary nodule (3 mm).  This was not previously noted.  Close attention to nodule on 6 exams.  There is no evidence of recurrent colon cancer or metastatic adenopathy in the abdomen or pelvis.   - He is due for surveillance colonoscopy in October 2025. - Repeat CT CAP should be done in November 2025. -Signatera ctDNA in February/March 2026.   Peripheral neuropathy Patient continues to have concerns with peripheral neuropathy.  These are most significant in his hands.  It affects his grips and general arm and hand strength.  He describes sensation as numbness and burning in the fingers and hands.  Because of this symptom, he has remained out of work.  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  He currently takes gabapentin  100 mg at bedtime.  This prevents pain and burning from waking him from sleep.  Will continue this as previously prescribed.  Will reassess his ability to return to work at next visit.  In the meantime, will update FMLA/disability paperwork.  Cancer of sigmoid colon Patient had CT CAP on 06/27/2024.  This showed no thoracic adenopathy.  There is single, small pulmonary nodule (3 mm).  This was not previously noted on prior exams.  Close attention repeat.  On subsequent exams.  There is no evidence of recurrent colon cancer in the abdomen or pelvis.  There is no metastatic adenopathy in the abdomen or pelvis.  Signatera test for ctDNA was negative on 06/10/2024.  Plan to repeat CT CAP in October or November 2025.  He will be due for colonoscopy in October 2025.  Repeat Signatera ctDNA test in December 2025.  Plan Labs reviewed. -Unremarkable CBC and CMP. -CEA normal at <1.00 Reviewed CT CAP from 06/27/2024.  This showed no thoracic adenopathy.  No evidence of recurrence or metastatic disease or lymphadenopathy in abdomen or pelvis.  There is single, small pulmonary nodule.  Will closely monitor and subsequent exams. Due for colonoscopy in October 2025. Repeat CT CAP in October/November 2025. Signatera ctDNA in February/March 2026. Continue gabapentin  every evening.  Update FMLA and disability paperwork due to persistent peripheral neuropathy. Plan for labs and follow-up in 3 months. Port flush every 6 weeks. The patient understands the plans discussed today and is in agreement with them.  He knows to contact our office if he develops concerns prior to his next appointment.  I provided 30 minutes of face-to-face time during this encounter and > 50% was spent counseling as documented under my assessment and plan.    Cory Gomez, Cory Gomez  [facility]  - A DEPT OF [facility] [address] [place] [address] Dept: [phone] Dept Fax: [phone]   Orders Placed This Encounter  Procedures   CT CHEST ABDOMEN PELVIS W CONTRAST    Gomez Status:   Future    Expected Date:   10/12/2024    Expiration Date:   09/22/2025    If indicated for the ordered procedure, I authorize the administration of contrast media per Radiology protocol:   Yes    Does the patient have a contrast media/X-Rafiq dye allergy?:   No    If indicated for the ordered procedure, I authorize the administration of oral contrast media per Radiology protocol:   Yes    Preferred imaging location?:   [facility]      CHIEF COMPLAINT:  CC: Cancer of sigmoid colon  Current Treatment: Surveillance  INTERVAL HISTORY:  Cory Gomez is here today for repeat clinical assessment.  He was last seen by me on 06/10/2024.  He has persistent peripheral neuropathy affecting his fingers, grip strength, and arm strength.  Currently taking gabapentin  in the evening helps prevent him waking up from burning and tingling in the arms.  Continues to stay out of work due to severity of neuropathy.  He works in [employer] and it is unsafe for him to perform many of his job activities without substantial grip and arm strength.  He mentions that FMLA and disability paperwork may need to be updated and submitted to employer to continue these benefits.  He is due for colonoscopy as it has been 1 year since initial diagnosis and surgery.  He states that he is doing well otherwise.  He denies chest pain, chest pressure, or shortness of breath. He denies headaches or visual disturbances. He denies abdominal pain, nausea, vomiting, or changes in bowel or bladder habits.  He denies blood in his stool or hematemesis. He denies fevers or chills. He denies pain. His appetite is good. His weight has been stable.  I have reviewed the past medical history, past surgical history,  social history and family history with the patient and they are unchanged from previous note.  ALLERGIES:  is allergic to ambien  [zolpidem ].  MEDICATIONS:  Current Outpatient Medications  Medication Sig Dispense Refill   Acetaminophen  (TYLENOL  PO) Take 500 mg by mouth every 6 (six) hours.     amoxicillin -clavulanate (AUGMENTIN ) 875-125 MG tablet Take 1 tablet by mouth every 12 (twelve) hours. 14 tablet 0   buPROPion  (WELLBUTRIN  XL) 150 MG 24 hr tablet Take 1 tablet (150 mg total) by mouth every morning. 30 tablet 2   DULoxetine  (CYMBALTA ) 60 MG capsule Take 1 capsule (60 mg total) by mouth 2 (two) times daily. 60 capsule 2   eszopiclone  (LUNESTA ) 2 MG TABS tablet Take 1 tablet (2 mg total) by mouth at bedtime as needed for sleep. Take immediately before bedtime 30 tablet 1   gabapentin  (NEURONTIN ) 100 MG capsule Take 1 capsule (100 mg total) by mouth at bedtime as needed. May increase to twice daily as needed. 60 capsule 2   lidocaine -prilocaine  (EMLA ) cream Apply to affected area once (Patient taking differently: Apply 1 Application topically as needed (for chemo port, apply three hours before treatment).) 30 g 3  ofloxacin (FLOXIN) 0.3 % OTIC solution Place 10 drops into the right ear daily for 7 days. 5 mL 0   ondansetron  (ZOFRAN ) 8 MG tablet Take 1 tablet (8 mg total) by mouth every 8 (eight) hours as needed for nausea or vomiting. 30 tablet 1   prochlorperazine  (COMPAZINE ) 10 MG tablet Take 1 tablet (10 mg total) by mouth every 6 (six) hours as needed for nausea or vomiting. (Patient taking differently: Take 10 mg by mouth every 6 (six) hours as needed for nausea or vomiting. Have not started) 30 tablet 1   propranolol  (INDERAL ) 10 MG tablet TAKE 1 TABLET(10 MG) BY MOUTH TWICE DAILY AS NEEDED 60 tablet 2   Vitamin D , Ergocalciferol , (DRISDOL ) 1.25 MG (50000 UNIT) CAPS capsule Take 1 capsule (50,000 Units total) by mouth every 7 (seven) days. 12 capsule 3   No current facility-administered  medications for this visit.    HISTORY OF PRESENT ILLNESS:   Oncology History  Cancer of sigmoid colon - pT4a, pN1a - s/p roboric LAR resection 09/27/2023  09/25/2023 Cancer Staging   Staging form: Colon and Rectum, AJCC 8th Edition - Pathologic stage from 09/25/2023: Stage IIIB (pT4a, pN1a, cM0) - Signed by [place] Cory BRAVO, Cory Gomez on 10/09/2023 Total positive nodes: 1 Histologic grading system: 4 grade system Histologic grade (G): G2 Residual tumor (R): R0 - None   09/25/2023 Surgery   Laparoscopic resection of sigmoid colon  Invasive, moderately differentiated adenocarcinoma of sigmoid colon. Invades into werosal surface. Margins are clear. Negative lymphovascular and perineural invasion. Metastasis to 1/45 lymph nodes.    09/25/2023 Pathology Results   FINAL MICROSCOPIC DIAGNOSIS:  A.   COLON, RESECTION: - Invasive moderately differentiated adenocarcinoma, 7.8 cm involving sigmoid colon - Carcinoma invades into the serosal surface - Resection margins are negative for carcinoma - Negative for lymphovascular or perineural invasion - Metastatic carcinoma to one of forty-five lymph nodes (1/45) - See oncology table B.   COLON, DISTAL MARGIN, EXCISION: - Colonic donut within normal limits, negative for carcinoma  ONCOLOGY TABLE:  COLON AND RECTUM, CARCINOMA:  Resection, Including Transanal Disk Excision of Rectal Neoplasms  Procedure: Resection, rectosigmoid colon Tumor Site: Sigmoid colon Tumor Size: 7.8 cm Macroscopic Tumor Perforation: Not identified  Histologic Type: Adenocarcinoma Histologic Grade: G2: Moderately differentiated Multiple Primary Sites: Not applicable Tumor Extension: Carcinoma invades into the serosal surface Lymphovascular Invasion: Not identified Perineural Invasion: Not identified Treatment Effect: No known presurgical therapy Margins:      Margin Status for Invasive Carcinoma: All margins negative for invasive carcinoma      Margin Status for  Non-Invasive Tumor: All margins negative for high-grade dysplasia / intramucosal           carcinoma and low-grade dysplasia Regional Lymph Nodes:      Number of Lymph Nodes with Tumor: 1      Number of Lymph Nodes Examined: 45 Tumor Deposits: Not identified Distant Metastasis:      Distant Site(s) Involved: Not applicable Pathologic Stage Classification (pTNM, AJCC 8th Edition): pT4a, pN1a Ancillary Studies: MMR / MSI testing will be ordered. Representative Tumor Block: A2 Comments: None (v4.2.0.1)     09/30/2023 Initial Diagnosis   Cancer of sigmoid colon - pT4a, pN1a - s/p roboric LAR resection 09/27/2023   10/08/2023 Imaging   CT Chest with contrast  IMPRESSION: Negative. No evidence of thoracic metastatic disease or other significant abnormality.   10/23/2023 -  Chemotherapy   Patient is on Treatment Plan : COLORECTAL FOLFOX q14d x 6 months  Genetic Testing   [facility] CancerNext-Expanded Panel+RNA was Negative. Report date is 11/15/2023.   The CancerNext-Expanded gene panel offered by [facility] and includes sequencing, rearrangement, and RNA analysis for the following 71 genes: AIP, ALK, APC, ATM, AXIN2, BAP1, BARD1, BMPR1A, BRCA1, BRCA2, BRIP1, CDC73, CDH1, CDK4, CDKN1B, CDKN2A, CHEK2, CTNNA1, DICER1, FH, FLCN, KIF1B, LZTR1, MAX, MEN1, MET, MLH1, MSH2, MSH3, MSH6, MUTYH, NF1, NF2, NTHL1, PALB2, PHOX2B, PMS2, POT1, PRKAR1A, PTCH1, PTEN, RAD51C, RAD51D, RB1, RET, SDHA, SDHAF2, SDHB, SDHC, SDHD, SMAD4, SMARCA4, SMARCB1, SMARCE1, STK11, SUFU, TMEM127, TP53, TSC1, TSC2, and VHL (sequencing and deletion/duplication); EGFR, EGLN1, HOXB13, KIT, MITF, PDGFRA, POLD1, and POLE (sequencing only); EPCAM and GREM1 (deletion/duplication only).         REVIEW OF SYSTEMS:   Constitutional: Denies fevers, chills or abnormal weight loss Eyes: Denies blurriness of vision Ears, nose, mouth, throat, and face: Denies mucositis or sore throat Respiratory: Denies cough, dyspnea or  wheezes Cardiovascular: Denies palpitation, chest discomfort or lower extremity swelling Gastrointestinal:  Denies nausea, heartburn or change in bowel habits Skin: Denies abnormal skin rashes Lymphatics: Denies new lymphadenopathy or easy bruising Neurological: persistnet and moderately severe neuropathy effecting his hands, grip strength, and arm strength.  Behavioral/Psych: Mood is stable, no new changes  All other systems were reviewed with the patient and are negative.   VITALS:   Today's Vitals   09/11/24 1000 09/11/24 1016  BP:  138/82  Pulse:  91  Resp:  15  Temp:  98.3 F (36.8 C)  TempSrc:  Temporal  SpO2:  97%  Weight:  (!) 305 lb 12.8 oz (138.7 kg)  Height:  6' 2 (1.88 m)  PainSc: 0-No pain    Body mass index is 39.26 kg/m.   Wt Readings from Last 3 Encounters:  09/11/24 (!) 305 lb 12.8 oz (138.7 kg)  09/10/24 (!) 307 lb 6.4 oz (139.4 kg)  06/15/24 (!) 317 lb 9.6 oz (144.1 kg)    Body mass index is 39.26 kg/m.  Performance status (ECOG): 1 - Symptomatic but completely ambulatory  PHYSICAL EXAM:   GENERAL:alert, no distress and comfortable SKIN: skin color, texture, turgor are normal, no rashes or significant lesions EYES: normal, Conjunctiva are pink and non-injected, sclera clear OROPHARYNX:no exudate, no erythema and lips, buccal mucosa, and tongue normal  NECK: supple, thyroid  normal size, non-tender, without nodularity LYMPH:  no palpable lymphadenopathy in the cervical, axillary or inguinal LUNGS: clear to auscultation and percussion with normal breathing effort HEART: regular rate & rhythm and no murmurs and no lower extremity edema ABDOMEN:abdomen soft, non-tender and normal bowel sounds Musculoskeletal:no cyanosis of digits and no clubbing  NEURO: alert & oriented x 3 with fluent speech, no focal motor/sensory deficits. Bilateral grip strength is diminished.   LABORATORY DATA:  I have reviewed the data as listed    Component Value Date/Time    NA 136 09/11/2024 0946   K 3.8 09/11/2024 0946   CL 104 09/11/2024 0946   CO2 25 09/11/2024 0946   GLUCOSE 131 (H) 09/11/2024 0946   BUN 15 09/11/2024 0946   CREATININE 0.83 09/11/2024 0946   CALCIUM  9.3 09/11/2024 0946   PROT 7.8 09/11/2024 0946   ALBUMIN 4.6 09/11/2024 0946   AST 29 09/11/2024 0946   ALT 37 09/11/2024 0946   ALKPHOS 62 09/11/2024 0946   BILITOT 0.9 09/11/2024 0946   GFRNONAA >60 09/11/2024 0946    Lab Results  Component Value Date   WBC 6.6 09/11/2024   NEUTROABS 4.8 09/11/2024   HGB 15.3  09/11/2024   HCT 42.7 09/11/2024   MCV 83.2 09/11/2024   PLT 254 09/11/2024

## 2024-09-11 ENCOUNTER — Other Ambulatory Visit: Payer: Self-pay

## 2024-09-11 ENCOUNTER — Inpatient Hospital Stay (HOSPITAL_BASED_OUTPATIENT_CLINIC_OR_DEPARTMENT_OTHER): Payer: Self-pay | Admitting: Nurse Practitioner

## 2024-09-11 ENCOUNTER — Inpatient Hospital Stay: Payer: Self-pay | Attending: Nurse Practitioner

## 2024-09-11 ENCOUNTER — Inpatient Hospital Stay

## 2024-09-11 VITALS — BP 138/82 | HR 91 | Temp 98.3°F | Resp 15 | Ht 74.0 in | Wt 305.8 lb

## 2024-09-11 DIAGNOSIS — Z08 Encounter for follow-up examination after completed treatment for malignant neoplasm: Secondary | ICD-10-CM | POA: Insufficient documentation

## 2024-09-11 DIAGNOSIS — G629 Polyneuropathy, unspecified: Secondary | ICD-10-CM | POA: Diagnosis not present

## 2024-09-11 DIAGNOSIS — C187 Malignant neoplasm of sigmoid colon: Secondary | ICD-10-CM

## 2024-09-11 DIAGNOSIS — Z85038 Personal history of other malignant neoplasm of large intestine: Secondary | ICD-10-CM | POA: Insufficient documentation

## 2024-09-11 LAB — CBC WITH DIFFERENTIAL (CANCER CENTER ONLY)
Abs Immature Granulocytes: 0.02 K/uL (ref 0.00–0.07)
Basophils Absolute: 0.1 K/uL (ref 0.0–0.1)
Basophils Relative: 1 %
Eosinophils Absolute: 0.1 K/uL (ref 0.0–0.5)
Eosinophils Relative: 2 %
HCT: 42.7 % (ref 39.0–52.0)
Hemoglobin: 15.3 g/dL (ref 13.0–17.0)
Immature Granulocytes: 0 %
Lymphocytes Relative: 18 %
Lymphs Abs: 1.2 K/uL (ref 0.7–4.0)
MCH: 29.8 pg (ref 26.0–34.0)
MCHC: 35.8 g/dL (ref 30.0–36.0)
MCV: 83.2 fL (ref 80.0–100.0)
Monocytes Absolute: 0.4 K/uL (ref 0.1–1.0)
Monocytes Relative: 6 %
Neutro Abs: 4.8 K/uL (ref 1.7–7.7)
Neutrophils Relative %: 73 %
Platelet Count: 254 K/uL (ref 150–400)
RBC: 5.13 MIL/uL (ref 4.22–5.81)
RDW: 13 % (ref 11.5–15.5)
WBC Count: 6.6 K/uL (ref 4.0–10.5)
nRBC: 0 % (ref 0.0–0.2)

## 2024-09-11 LAB — CMP (CANCER CENTER ONLY)
ALT: 37 U/L (ref 0–44)
AST: 29 U/L (ref 15–41)
Albumin: 4.6 g/dL (ref 3.5–5.0)
Alkaline Phosphatase: 62 U/L (ref 38–126)
Anion gap: 7 (ref 5–15)
BUN: 15 mg/dL (ref 6–20)
CO2: 25 mmol/L (ref 22–32)
Calcium: 9.3 mg/dL (ref 8.9–10.3)
Chloride: 104 mmol/L (ref 98–111)
Creatinine: 0.83 mg/dL (ref 0.61–1.24)
GFR, Estimated: >60 mL/min (ref >60)
Glucose, Bld: 131 mg/dL — ABNORMAL HIGH (ref 70–99)
Potassium: 3.8 mmol/L (ref 3.5–5.1)
Sodium: 136 mmol/L (ref 135–145)
Total Bilirubin: 0.9 mg/dL (ref 0.0–1.2)
Total Protein: 7.8 g/dL (ref 6.5–8.1)

## 2024-09-11 LAB — CEA (ACCESS): CEA (CHCC): <1 ng/mL (ref 0.00–5.00)

## 2024-09-14 ENCOUNTER — Telehealth: Payer: Self-pay | Admitting: Hematology

## 2024-09-14 NOTE — Telephone Encounter (Signed)
 Scheduled patient appointments. Called and spoke with patient, he is aware.

## 2024-09-17 ENCOUNTER — Ambulatory Visit (INDEPENDENT_AMBULATORY_CARE_PROVIDER_SITE_OTHER): Admitting: Clinical

## 2024-09-17 DIAGNOSIS — F330 Major depressive disorder, recurrent, mild: Secondary | ICD-10-CM

## 2024-09-17 DIAGNOSIS — F411 Generalized anxiety disorder: Secondary | ICD-10-CM

## 2024-09-17 NOTE — Progress Notes (Signed)
 THERAPIST PROGRESS NOTE  Session Time:  10:00am-11:00am   Session #21  Participation Level: Active  Behavioral Response: Casual Alert Euthymic   Type of Therapy: Individual Therapy  Treatment Goals Addressed:  STG: Cory Gomez will reduce frequency of avoidant behaviors by 50% as evidenced by self-report in therapy sessions  LTG: Learn a variety of coping skills and demonstrate the ability to use them to decrease feelings of sadness, anger, and fear and increase feelings of happiness, peace, and powerfulness AEB gauging those emotions on 1-10 scale.  LTG: Learn breathing techniques and grounding techniques at an age-appropriate level and demonstrate mastery in session then report independent use of these skills out of session.  LTG: Explore personal core beliefs, rules and assumptions, and cognitive distortions through therapist using Cognitive Behavioral Therapy; learn how to develop replacement thoughts and challenge unhelpful thoughts. STG: Score less than 9 on the PHQ-9 and less than 5 on the GAD-7 as evidenced by intermittent administration of the questionnaires to determine progress in managing depression and anxiety.  LTG: Learn and practice communication techniques such as active listening, I statements, open-ended questions, reflective listening, assertiveness, fair fighting rules, initiating conversations, and more as necessary and taught in session.  LTG: Learn about boundary styles and types, how to implement them, and how to enforce them so that feels more empowered and content with being able to maintain more helpful, appropriate boundaries in the future for a more balanced result. LTG: Work to Arts development officer from models like CBT, Stages of Change, DBT, shame resilience theory, ACT, SFBT, MI, trauma-informed therapy and others to be able to manage mental health symptoms, AEB practicing out of session and reporting back.  STG: Improve self-esteem by engaging in daily  affirmations, developing new skills, gratitude journaling, use of SMART goals, increased assertiveness, challenging negative beliefs, and focusing on what patient can control   LTG: Increase coping skills to manage depression/anxiety/anger and improve ability to perform daily activities as evidenced by fewer depressive episodes and/or anger outbursts and/or anxious days per self-report and collateral information.   ProgressTowards Goals: Progressing  Interventions: Supportive and Other: processing anger, discussing anxiety symptoms   Summary: Cory Gomez is a 43 y.o. male who presents with MDD/severe, GAD, insomnia, relationship problems with his wife due to her constantly leaving jobs, and recent completion of cancer treatments.  He presented oriented x5 and stated he was feeling fine.  CSW evaluated patient's medication compliance, use of coping tools, and self-care, as applicable.  He provided an update on various aspects of his life that are normally discussed in therapy, including wife's loss of another job, sleep not being good right now, eating over his feelings, getting job rejections, and how he tries to [employer].  He appears to be handling wife leaving another job better than he has in the past, is more accepting that it is something he has no power to change, although he is quite anxious for her to start the new job she has found with [facility]osp[facility].  It concerns him that in the interim they have only had his small disability check to pay bills.  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  He does have an option  of working for the same company he previously worked for, only in a different capacity.  His doctor will not release him to the previous  work because of his trouble with neuropathy and grip weakness, but he could work in [employer].  It would still be 1-1/2 hours away and he would have to stay with his parents during the weekdays, ride with his stepfather to work.  It would simply be too far to commute to where he lives with wife every day.  We processed his feelings about the lack of positive result in the IT field, jobs he simply will not consider, and how he is handling the stress.  CSW noted that he is biting his fingernails, cuticles, and fingers to the point of making some of them bleed.  He also appears to have gained weight and disclosed that he is overeating his feelings.  He also does gain enjoyment and peace from communing outdoors with the deer who comes to eat fruit off the trees in their yard, as well as listening to nature.  He is reading a well-known book from which he is gaining inspiration.  We reviewed some of the coping skills that he has been taught in the past.  Suicidal/Homicidal: No without intent/plan  Therapist Response: Patient is progressing AEB engaging in scheduled therapy session.  Throughout the session, CSW gave patient the opportunity to explore thoughts and feelings associated with current life situations and past/present stressors.   CSW challenged patient gently and appropriately to consider different ways of looking at reported issues. CSW encouraged patient's expression of feelings and validated these using empathy, active listening, open body language, and unconditional positive regard.   We made 3 additional appointments    Plan/Recommendations:  Return to therapy at next scheduled appointment on 11/12, reflect on what was discussed in session, engage in self care behaviors as explored in session, do homework as assigned (anxiety coping skills), and return to next session prepared to talk about experience with new coping methods.   Diagnosis:  Encounter Diagnoses  Name Primary?   Major depressive  disorder, recurrent episode, mild    GAD (generalized anxiety disorder) Yes    Collaboration of Care: Psychiatrist AEB - psychiatrist and therapist both have access to notes in Epic  Patient/Guardian was advised Release of Information must be obtained prior to any record release in order to collaborate their care with an outside provider. Patient/Guardian was advised if they have not already done so to contact the registration department to sign all necessary forms in order for us  to release information regarding their care.   Consent: Patient/Guardian gives verbal consent for treatment and assignment of benefits for services provided during this visit. Patient/Guardian expressed understanding and agreed to proceed.   [name], LCSW 09/19/2024

## 2024-09-19 ENCOUNTER — Ambulatory Visit
Admission: RE | Admit: 2024-09-19 | Discharge: 2024-09-19 | Disposition: A | Discharge status: Home / Self Care | Source: Ambulatory Visit | Attending: Internal Medicine

## 2024-09-19 ENCOUNTER — Encounter (HOSPITAL_COMMUNITY): Payer: Self-pay | Admitting: Clinical

## 2024-09-19 VITALS — BP 133/85 | HR 110 | Temp 98.3°F | Resp 20

## 2024-09-19 DIAGNOSIS — H9201 Otalgia, right ear: Secondary | ICD-10-CM

## 2024-09-19 DIAGNOSIS — H60391 Other infective otitis externa, right ear: Secondary | ICD-10-CM | POA: Diagnosis not present

## 2024-09-19 MED ORDER — AMOXICILLIN-POT CLAVULANATE 875-125 MG PO TABS: 1.0000 | ORAL_TABLET | Freq: Two times a day (BID) | ORAL | 0 refills | Status: DC

## 2024-09-19 MED ORDER — DEXAMETHASONE SODIUM PHOSPHATE 10 MG/ML IJ SOLN
10.0000 mg | Freq: Once | INTRAMUSCULAR | Status: AC
  Administered 2024-09-19: 10 mg via INTRAMUSCULAR

## 2024-09-19 MED ORDER — OFLOXACIN 0.3 % OT SOLN
10.0000 [drp] | Freq: Every day | OTIC | 0 refills | Status: AC
Start: 2024-09-19 — End: 2024-09-26

## 2024-09-19 NOTE — Discharge Instructions (Addendum)
 You have an ear infection of the ear canal known as otitis externa. Take Augmentin  antibiotic every 12 hours for the next 7 days to treat suspected ear infection to the inner and outer ear. Use ear drops as prescribed for 7 days by placing 10 drops into the right ear canal once daily. Place a cotton ball into the external ear canal while in the shower to avoid getting water  into the ears.  You may place a small amount of rubbing alcohol onto the end of a Q-tip and place this into the outer ear canal to dry up any remaining water  that may have gotten into the ear while showering or submerging head underwater to prevent this type of infection in the future.   We gave you a shot of steroid today to help with the swelling of the right ear canal.  You may continue taking Tylenol  1000 mg every 6 hours as needed for pain.  Please schedule an appointment with your primary care provider for recheck of your right ear pain in the next 2 to 3 days to make sure that your symptoms are improving.   If you develop any new or worsening symptoms or if your symptoms do not start to improve, please return here or follow-up with your primary care provider. If your symptoms are severe, please go to the emergency room.

## 2024-09-19 NOTE — ED Provider Notes (Signed)
 [facility]    CSN: [id number] Arrival date & time: 09/19/24  0850      History   Chief Complaint Chief Complaint  Patient presents with   Ear Fullness    Entered by patient    HPI Cory Gomez is a 43 y.o. male.   Cory Gomez is a 43 y.o. male presenting for chief complaint of right ear pain, ear fullness, and slightly decreased hearing to the right ear that started 3 days ago without trauma or injuries.  Right ear pain is currently an 8 on a scale of 0-10.  Denies drainage from the right ear, fever/chills, dizziness, viral URI symptoms, and recent antibiotic or steroid use.  Denies neck pain and headache.  He took Tylenol  last night for ear pain with minimal relief.   Ear Fullness    Past Medical History:  Diagnosis Date   ADHD (attention deficit hyperactivity disorder)    Annual physical exam 09/09/2019   Anxiety and depression    Cancer (HCC)    Colon cancer (HCC)    COVID-19    12/28/19   Depression, recurrent 09/09/2019   Headache 12/31/2021   History of kidney stones    Hypertriglyceridemia 07/17/2021   Insomnia    Inverted nipple 09/16/2019   Kidney stone    Mastoiditis of both sides    Nystagmus 01/22/2022   Obesity (BMI 30-39.9)    Obesity (BMI 30-39.9)    Perforated sigmoid colon (HCC) 07/06/2023   Suicidal ideation 07/14/2021   Vertigo     Patient Active Problem List   Diagnosis Date Noted   Chemotherapy-induced neuropathy 01/27/2024   Weakness of right upper extremity 01/08/2024   Port-A-Cath in place 10/23/2023   Cancer of sigmoid colon - pT4a, pN1a - s/p roboric LAR resection 09/27/2023 09/30/2023   Sigmoid diverticulitis 09/25/2023   Left lower quadrant abdominal pain 08/03/2023   Abscess of sigmoid colon 07/10/2023   Mood disorder 06/08/2023   Annual physical exam 06/08/2023   Hypertension 01/11/2022   Morbid obesity with BMI of 40.0-44.9, adult (HCC) 07/17/2021   Hyperlipidemia 01/13/2020   Acne 09/09/2019    GAD (generalized anxiety disorder) 08/17/2019   ADD (attention deficit disorder) 07/24/2019   Binge eating disorder 07/24/2019   Vitamin D  deficiency 06/11/2019   Prediabetes    Major depressive disorder, recurrent episode, in partial remission     Past Surgical History:  Procedure Laterality Date   COLON RESECTION  09/25/2023   PORTACATH PLACEMENT Right 10/17/2023   Procedure: INSERTION PORT-A-CATH WITH MATILDA GUIDANCE;  Surgeon: Sheldon Standing, MD;  Location: [facility];  Service: General;  Laterality: Right;   PROCTOSCOPY N/A 09/25/2023   Procedure: RIGID PROCTOSCOPY;  Surgeon: Sheldon Standing, MD;  Location: [facility];  Service: General;  Laterality: N/A;       Home Medications    Prior to Admission medications   Medication Sig Start Date End Date Taking? Authorizing Provider  amoxicillin -clavulanate (AUGMENTIN ) 875-125 MG tablet Take 1 tablet by mouth every 12 (twelve) hours. 09/19/24  Yes Enedelia Dorna HERO, FNP  ofloxacin (FLOXIN) 0.3 % OTIC solution Place 10 drops into the right ear daily for 7 days. 09/19/24 09/26/24 Yes Enedelia Dorna HERO, FNP  Acetaminophen  (TYLENOL  PO) Take 500 mg by mouth every 6 (six) hours.    [provider]  buPROPion  (WELLBUTRIN  XL) 150 MG 24 hr tablet Take 1 tablet (150 mg total) by mouth every morning. 07/23/24   Carvin Arvella HERO, MD  DULoxetine  (  CYMBALTA ) 60 MG capsule Take 1 capsule (60 mg total) by mouth 2 (two) times daily. 07/23/24 10/21/24  Carvin Arvella HERO, MD  eszopiclone  (LUNESTA ) 2 MG TABS tablet Take 1 tablet (2 mg total) by mouth at bedtime as needed for sleep. Take immediately before bedtime 01/07/24 01/06/25  Carvin Arvella HERO, MD  gabapentin  (NEURONTIN ) 100 MG capsule Take 1 capsule (100 mg total) by mouth at bedtime as needed. May increase to twice daily as needed. 09/10/24   Gretel App, NP  lidocaine -prilocaine  (EMLA ) cream Apply to affected area once Patient taking differently: Apply 1 Application topically as  needed (for chemo port, apply three hours before treatment). 10/09/23   Lanny Callander, MD  ondansetron  (ZOFRAN ) 8 MG tablet Take 1 tablet (8 mg total) by mouth every 8 (eight) hours as needed for nausea or vomiting. 03/04/24   Lanny Callander, MD  prochlorperazine  (COMPAZINE ) 10 MG tablet Take 1 tablet (10 mg total) by mouth every 6 (six) hours as needed for nausea or vomiting. Patient taking differently: Take 10 mg by mouth every 6 (six) hours as needed for nausea or vomiting. Have not started 10/09/23   Lanny Callander, MD  propranolol  (INDERAL ) 10 MG tablet TAKE 1 TABLET(10 MG) BY MOUTH TWICE DAILY AS NEEDED 01/27/24   Carvin Arvella HERO, MD  Vitamin D , Ergocalciferol , (DRISDOL ) 1.25 MG (50000 UNIT) CAPS capsule Take 1 capsule (50,000 Units total) by mouth every 7 (seven) days. 03/29/24   Hope Merle, MD    Family History Family History  Problem Relation Age of Onset   Diabetes Mother    Hypertension Mother    Hyperlipidemia Mother    Diabetes Mellitus II Mother    Anxiety disorder Mother    Depression Mother    Basal cell carcinoma Mother 69 - 64   Stroke Father    Alcoholism Father    Anxiety disorder Brother    Depression Brother    Hypertension Brother    Hyperlipidemia Brother    Diabetes type II Brother    ADD / ADHD Brother    Squamous cell carcinoma Maternal Grandmother    Dementia Maternal Grandfather    Basal cell carcinoma Maternal Grandfather    Prostate cancer Maternal Grandfather 48   Lung cancer Paternal Grandfather 86       smoked   Stomach cancer Neg Hx    Colon cancer Neg Hx    Esophageal cancer Neg Hx    Pancreatic cancer Neg Hx     Social History Social History   Tobacco Use   Smoking status: Never   Smokeless tobacco: Never  Vaping Use   Vaping status: Never Used  Substance Use Topics   Alcohol use: Never   Drug use: Never     Allergies   Ambien  [zolpidem ]   Review of Systems Review of Systems Per HPI  Physical Exam Triage Vital Signs ED Triage Vitals   Encounter Vitals Group     BP 09/19/24 0857 133/85     Girls Systolic BP Percentile --      Girls Diastolic BP Percentile --      Boys Systolic BP Percentile --      Boys Diastolic BP Percentile --      Pulse Rate 09/19/24 0857 (!) 110     Resp 09/19/24 0857 20     Temp 09/19/24 0857 98.3 F (36.8 C)     Temp Source 09/19/24 0857 Oral     SpO2 09/19/24 0857 95 %     Weight --  Height --      Head Circumference --      Peak Flow --      Pain Score 09/19/24 0858 8     Pain Loc --      Pain Education --      Exclude from Growth Chart --    No data found.  Updated Vital Signs BP 133/85 (BP Location: Right Arm)   Pulse (!) 110   Temp 98.3 F (36.8 C) (Oral)   Resp 20   SpO2 95%   Visual Acuity Right Eye Distance:   Left Eye Distance:   Bilateral Distance:    Right Eye Near:   Left Eye Near:    Bilateral Near:     Physical Exam Vitals and nursing note reviewed.  Constitutional:      Appearance: He is not ill-appearing or toxic-appearing.  HENT:     Head: Normocephalic and atraumatic.     Right Ear: External ear normal. Decreased hearing noted. Swelling and tenderness present. No drainage.     Left Ear: Hearing, tympanic membrane, ear canal and external ear normal.     Ears:     Comments: Right ear canal is significantly swollen and erythematous.  No visualized drainage to the right ear canal.  I am unable to visualize the right eardrum due to pain and swelling of the ear canal.  External ear is normal.  Hearing is slightly decreased to the right ear versus left.    Nose: Nose normal.     Mouth/Throat:     Lips: Pink.     Mouth: Mucous membranes are moist. No injury or oral lesions.     Dentition: Normal dentition.     Tongue: No lesions.     Pharynx: Oropharynx is clear. Uvula midline. No pharyngeal swelling, oropharyngeal exudate, posterior oropharyngeal erythema, uvula swelling or postnasal drip.     Tonsils: No tonsillar exudate.  Eyes:     General: Lids  are normal. Vision grossly intact. Gaze aligned appropriately.     Extraocular Movements: Extraocular movements intact.     Conjunctiva/sclera: Conjunctivae normal.  Neck:     Trachea: Trachea and phonation normal.  Pulmonary:     Effort: Pulmonary effort is normal.  Musculoskeletal:     Cervical back: Neck supple.  Lymphadenopathy:     Cervical: Cervical adenopathy present.  Skin:    General: Skin is warm and dry.     Capillary Refill: Capillary refill takes less than 2 seconds.     Findings: No rash.  Neurological:     General: No focal deficit present.     Mental Status: He is alert and oriented to person, place, and time. Mental status is at baseline.     Cranial Nerves: No dysarthria or facial asymmetry.  Psychiatric:        Mood and Affect: Mood normal.        Speech: Speech normal.        Behavior: Behavior normal.        Thought Content: Thought content normal.        Judgment: Judgment normal.      UC Treatments / Results  Labs (all labs ordered are listed, but only abnormal results are displayed) Labs Reviewed - No data to display  EKG   Radiology No results found.  Procedures Procedures (including critical care time)  Medications Ordered in UC Medications  dexamethasone  (DECADRON ) injection 10 mg (10 mg Intramuscular Given 09/19/24 0921)    Initial Impression /  Assessment and Plan / UC Course  I have reviewed the triage vital signs and the nursing notes.  Pertinent labs & imaging results that were available during my care of the patient were reviewed by me and considered in my medical decision making (see chart for details).   1.  Infective otitis externa of right ear, otalgia of right ear Suspect pain and swelling of the right ear are due to infective otitis externa of the right ear. I am unable to see the right eardrum and therefore we will cover for otitis media with Augmentin  antibiotic every 12 hours for 7 days as well as ofloxacin eardrops 10  drops daily for 7 days. Dexamethasone  10 mg IM given in clinic to reduce swelling and pain to the right ear canal. He may use Tylenol  as needed for pain/swelling.  He is not a candidate for NSAIDs due to history of chemotherapy/side effects from chemotherapy as a result of sigmoid colon cancer.  Recommend follow-up with primary care provider in the next 2 to 3 days for recheck to ensure that symptoms are improving.   Counseled patient on potential for adverse effects with medications prescribed/recommended today, strict ER and return-to-clinic precautions discussed, patient verbalized understanding.    Final Clinical Impressions(s) / UC Diagnoses   Final diagnoses:  Infective otitis externa of right ear  Otalgia of right ear     Discharge Instructions      You have an ear infection of the ear canal known as otitis externa. Take Augmentin  antibiotic every 12 hours for the next 7 days to treat suspected ear infection to the inner and outer ear. Use ear drops as prescribed for 7 days by placing 10 drops into the right ear canal once daily. Place a cotton ball into the external ear canal while in the shower to avoid getting water  into the ears.  You may place a small amount of rubbing alcohol onto the end of a Q-tip and place this into the outer ear canal to dry up any remaining water  that may have gotten into the ear while showering or submerging head underwater to prevent this type of infection in the future.   We gave you a shot of steroid today to help with the swelling of the right ear canal.  You may continue taking Tylenol  1000 mg every 6 hours as needed for pain.  Please schedule an appointment with your primary care provider for recheck of your right ear pain in the next 2 to 3 days to make sure that your symptoms are improving.   If you develop any new or worsening symptoms or if your symptoms do not start to improve, please return here or follow-up with your primary care  provider. If your symptoms are severe, please go to the emergency room.   ED Prescriptions     Medication Sig Dispense Auth. Provider   amoxicillin -clavulanate (AUGMENTIN ) 875-125 MG tablet Take 1 tablet by mouth every 12 (twelve) hours. 14 tablet Tyrisha Benninger M, FNP   ofloxacin (FLOXIN) 0.3 % OTIC solution Place 10 drops into the right ear daily for 7 days. 5 mL Enedelia Dorna HERO, FNP      PDMP not reviewed this encounter.   Enedelia Dorna [place], [place] 09/19/24 [phone]

## 2024-09-19 NOTE — ED Triage Notes (Signed)
 Pt reports he has right ear pain  and is clogged x 3 days.   Took tylenol 

## 2024-09-21 ENCOUNTER — Encounter: Payer: Self-pay | Admitting: Hematology

## 2024-09-22 ENCOUNTER — Encounter: Payer: Self-pay | Admitting: Hematology

## 2024-09-22 ENCOUNTER — Encounter: Payer: Self-pay | Admitting: Nurse Practitioner

## 2024-09-22 NOTE — Assessment & Plan Note (Addendum)
-  pT4aN1M0 stage IIIB, MMS, G2 -Presented with perforated diverticulitis in sigmoid colon in July, 2024. -Patient underwent laparoscopic resection of sigmoid colon. Path reviewed. There was moderate differentiated adenocarcinoma with 1 positive lymph nodes. -Adjuvant chemotherapy FOLFOX was recommended for 6 months. Oxaliplatin  was held from final 2 cycles of chemotherapy due to peripheral neuropathy.  -Surveillance CT scan on March 25, 2024 was negative for recurrence. -he completed chemotherapy on 04/15/2024.  -ctDNA signatera testing drawn 06/10/2024. Results were negative.  -surveillance CT CAP done 06/27/2024.  There is no thoracic adenopathy.  Single, small left pulmonary nodule (3 mm).  This was not previously noted.  Close attention to nodule on 6 exams.  There is no evidence of recurrent colon cancer or metastatic adenopathy in the abdomen or pelvis.   - He is due for surveillance colonoscopy in October 2025. - Repeat CT CAP should be done in November 2025. -Signatera ctDNA in February/March 2026.

## 2024-09-23 ENCOUNTER — Encounter: Payer: Self-pay | Admitting: Hematology

## 2024-09-23 ENCOUNTER — Telehealth: Payer: Self-pay

## 2024-09-23 NOTE — Telephone Encounter (Signed)
 Patient has been scheduled for colonoscopy with Dr. Albertus on 11/24/24 at 8:30 am and pre-visit on 11/16/24 at 8:30 am. Patient is aware of appts.

## 2024-09-23 NOTE — Telephone Encounter (Signed)
-----   Message from Gordy HERO Pyrtle sent at 09/23/2024  3:05 PM EDT ----- Patient needs to be scheduled for [facility] surveillance colonoscopy for personal history of colon cancer 2-day prep JMP ----- Message ----- From: Hanford Powell BRAVO, NP Sent: 09/22/2024   4:16 PM EDT To: Norleen LOISE Kiang, MD; Gordy HERO Starch, MD  Good afternoon.  This patient was seen by you in hosptial in 09/2023. He was initially diagnosed with diverticulitis. Biopsies during his colonoscopy did show colon cancer. He has completed chemotherapy treatment and is due to have repeat colonoscopy in 09/2024. He was not aware of upcoming appointment. Will you be able to schedule this for the patient? He has done well with negative ctDNA test and normal CEA. Thank you so much. Sincerely, Powell Hanford, NP

## 2024-09-24 ENCOUNTER — Encounter: Payer: Self-pay | Admitting: Nurse Practitioner

## 2024-09-24 NOTE — Assessment & Plan Note (Addendum)
 Stage III colon cancer post resection and chemotherapy. Surveillance CT showed a new lung nodule, likely benign, under oncology evaluation. Follow up with oncology as scheduled. Continue surveillance with CT scans as recommended by oncology.

## 2024-09-24 NOTE — Assessment & Plan Note (Signed)
 Mood stable on current medication regimen. Continue Wellbutrin  and Cymbalta . Follow up with psychiatrist and therapist as scheduled.

## 2024-09-24 NOTE — Assessment & Plan Note (Signed)
 Blood pressure 118/90 mmHg, history of elevated readings normalizing after rest. Improvement noted on second reading. Stable. We will continue to monitor.

## 2024-09-24 NOTE — Assessment & Plan Note (Signed)
 Neuropathy secondary to chemotherapy, managed with gabapentin . Ensure gabapentin  prescription is filled and available at pharmacy.

## 2024-09-24 NOTE — Assessment & Plan Note (Signed)
 Recent A1c of 5.9%, emphasizing lifestyle modifications. Encourage diet and exercise modifications to manage prediabetes.

## 2024-09-24 NOTE — Assessment & Plan Note (Signed)
 Persistent deficiency confirmed by June 2025 labs. Continue high dose weekly supplementation.

## 2024-09-24 NOTE — Assessment & Plan Note (Signed)
 Mildly elevated LDL cholesterol at 106 mg/dL, focusing on lifestyle changes. Encourage diet and exercise modifications to manage hyperlipidemia.

## 2024-10-05 NOTE — Progress Notes (Unsigned)
 BH MD/PA/NP OP Progress Note  10/08/2024 12:03 PM Cory Gomez  MRN:  [id number]  Visit Diagnosis:    ICD-10-CM   1. Major depressive disorder, recurrent episode, mild  F33.0 DULoxetine  (CYMBALTA ) 60 MG capsule    buPROPion  (WELLBUTRIN  XL) 150 MG 24 hr tablet    2. GAD (generalized anxiety disorder)  F41.1 DULoxetine  (CYMBALTA ) 60 MG capsule    3. Insomnia due to mental disorder  F51.05 eszopiclone  (LUNESTA ) 2 MG TABS tablet       [name] is a 43 y.o. male with a history of MDD, GAD, ADHD, binge eating disorder, colon cancer stage 3b in treatment, and Vit D deficiency who presented to [facility] at [facility] for initial evaluation on 01/15/2023.  At initial evaluation patient reported neurovegetative symptoms of depression including low mood, anhedonia, amotivation, worthlessness, insomnia, increased appetite, and occasional passive SI.  He denied any intent or plan.  Patient was able to complete a safety plan and lists his mother as a good support.  Patient furthermore endorsed symptoms of anxiety including feeling nervous or on edge, being unable to stop or control his worrying, difficulty relaxing, increased irritability, restlessness, and fears that something awful might happen.  Psychosocially patient has increased stress from financial struggles, difficulty at work, and interpersonal struggles in his marriage.  Patient meets criteria for MDD and generalized anxiety disorder with further clarity needed to rule out ADHD.  He was referred for neuropsych testing.  Of note due to patient's sleep troubles and body habitus there is some concern for sleep apnea and he was referred to a sleep specialist.  Cory Gomez presents for follow-up evaluation. Today, 10/08/24, patient reports that anxiety has been stable while mood has been slightly declined recently.  This is due to the ongoing stressors of recover from chemotherapy and  difficulty finding employment.  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  Patient was started on gabapentin  by PCP to manage this.  He has not taken Lunesta  though would like to restart given poor sleep.  Reviewed risk and benefits and interactions with gabapentin .  Patient is aware and would plan to transition to solely gabapentin  if dose was increased past 300 in the future.  Will continue remainder of current regimen and follow-up in 2 months.  Psychotherapeutic interventions were used during today's session. From 11:36 AM to 11:59 AM. Therapeutic interventions included empathic listening and supportive therapy. Used supportive interviewing techniques to provide emotional validation. Improvement was evidenced by patient's participation and identified commitment to therapy goals.   Plan: - Continue Cymbalta  to 60 mg BID - Continue Wellbutrin  XL 150 mg every day - Conitnue propranolol  10 mg BID prn for anxiety - Continue Lunesta  2 mg QHS as needed for insomnia, last filled prescription was in October 2025 - Gabapentin  100-200 mg prescribed by his PCP - Neuropsych referral - Sleep specialist appointment delayed until cancer piece is resolved  - Continue therapy with Cory Gomez twice a month - Going to support groups for people with cancer - Crisis resources reviewed - Follow up in 2 months  Chief Complaint:  Chief Complaint  Patient presents with   Follow-up   HPI: Cory Gomez presents reporting that he is ok all considering. He had a CT a few months ago that showed a small nodule on his lung. He has another scan next week. If the next scan shows it is  unchanged or reduced then he would be considered to be in the clear, only requiring standard monitoring.  Chemo has caused some nerve damage that makes it hard for him to grasp things. He also feels tingling in the hand on the right side of his body. It has  been effecting his sleep and now he is only getting 3-4 hours a night. He has not bee taking the Lunesta  as he had run out, and had the goal of not getting back on it. He has been started on gabapentin  for the nerve pain at 100 mg.  He has had some benefit from nerve pain but not any impact on sleep.  Patient is interested in getting back on Lunesta  at this point.  He is aware of the risk and interactions between Lunesta  and gabapentin .  He would not plan to increase the dose past 300 mg while taking both.  If he were to go higher on the gabapentin  then he would plan to discontinue Lunesta .  He has been applying for jobs and has gotten a lot of rejection letters. The year and a half of unemployment due to medical issues he thinks is a red flag he thinks. He has had interviews that went well, but did not get the jobs. He would really prefer to work. Looking into SSI/SSDI if he loses the long term disability and he can not get another job. His long term disability provider is assisting with this and they are pursuing it related to the cancer and side effects from treatment.  He is frustrated with the need to depend on people to care for himself when he is. He does feel down that he is not getting the opportunity to show his worth. Empathic listening techniques were used.  Things with her partner are going well. While things are still tight financially they are stable with the assistance of the long term disability.   Past Psychiatric History: First episode of depression was in early elementary school after his parents divorced. He was in counseling and prescribed bupropion  then. Later, as an adult, he was briefly on fluoxetine with no clear benefit. While in school he was diagnosed with having ADHD inattentive type and was on Ritalin. He had seen Dr. Lynnetta from 2020-2022. He has no hx of inpatient psychiatric admissions, no hx of mania or overt psychosis. Denies any prior suicide attempts.   Has tried  Adderall , Vyvanse , Ritalin, bupropion , Prozac, Zoloft , Cymbalta , trazodone , Zolpidem , topirimate, Lunesta    He does not abuse alcohol or street drugs.  Past Medical History:  Past Medical History:  Diagnosis Date   ADHD (attention deficit hyperactivity disorder)    Annual physical exam 09/09/2019   Anxiety and depression    Cancer (HCC)    Colon cancer (HCC)    COVID-19    12/28/19   Depression, recurrent 09/09/2019   Headache 12/31/2021   History of kidney stones    Hypertriglyceridemia 07/17/2021   Insomnia    Inverted nipple 09/16/2019   Kidney stone    Mastoiditis of both sides    Nystagmus 01/22/2022   Obesity (BMI 30-39.9)    Obesity (BMI 30-39.9)    Perforated sigmoid colon (HCC) 07/06/2023   Suicidal ideation 07/14/2021   Vertigo     Past Surgical History:  Procedure Laterality Date   COLON RESECTION  09/25/2023   PORTACATH PLACEMENT Right 10/17/2023   Procedure: INSERTION PORT-A-CATH WITH MATILDA GUIDANCE;  Surgeon: Sheldon Standing, MD;  Location: [facility];  Service: General;  Laterality: Right;   PROCTOSCOPY N/A 09/25/2023   Procedure: RIGID PROCTOSCOPY;  Surgeon: Sheldon Standing, MD;  Location: [facility];  Service: General;  Laterality: N/A;    Family History:  Family History  Problem Relation Age of Onset   Diabetes Mother    Hypertension Mother    Hyperlipidemia Mother    Diabetes Mellitus II Mother    Anxiety disorder Mother    Depression Mother    Basal cell carcinoma Mother 13 - 67   Stroke Father    Alcoholism Father    Anxiety disorder Brother    Depression Brother    Hypertension Brother    Hyperlipidemia Brother    Diabetes type II Brother    ADD / ADHD Brother    Squamous cell carcinoma Maternal Grandmother    Dementia Maternal Grandfather    Basal cell carcinoma Maternal Grandfather    Prostate cancer Maternal Grandfather 44   Lung cancer Paternal Grandfather 74       smoked   Stomach cancer Neg Hx    Colon cancer Neg Hx     Esophageal cancer Neg Hx    Pancreatic cancer Neg Hx     Social History:  Social History   Socioeconomic History   Marital status: Married    Spouse name: Not on file   Number of children: Not on file   Years of education: Not on file   Highest education level: Not on file  Occupational History   Not on file  Tobacco Use   Smoking status: Never   Smokeless tobacco: Never  Vaping Use   Vaping status: Never Used  Substance and Sexual Activity   Alcohol use: Never   Drug use: Never   Sexual activity: Yes    Partners: Female  Other Topics Concern   Not on file  Social History Narrative   Married no kids wife is Jden Want    -wife and mother Luke Paling on [place]      Has siblings    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   1 dog    Social Drivers of Corporate investment banker Strain: High Risk (10/11/2023)   Overall Financial Resource Strain (CARDIA)    Difficulty of Paying Living Expenses: Very hard  Food Insecurity: No Food Insecurity (01/08/2024)   Hunger Vital Sign    Worried About Running Out of Food in the Last Year: Never true    Ran Out of Food in the Last Year: Never true  Transportation Needs: No Transportation Needs (01/08/2024)   PRAPARE - Administrator, Civil Service (Medical): No    Lack of Transportation (Non-Medical): No  Physical Activity: Not on file  Stress: Not on file  Social Connections: Moderately Integrated (01/08/2024)   Social Connection and Isolation Panel    Frequency of Communication with Friends and Family: More than three times a week    Frequency of Social Gatherings with Friends and Family: Three times a week    Attends Religious Services: More than 4 times per year    Active Member of Clubs or Organizations: No    Attends Banker Meetings: Never    Marital Status: Married    Allergies:  Allergies  Allergen Reactions   Ambien  [Zolpidem ] Other (See Comments)    Pt requests we do not give this medication.      Current Medications: Current Outpatient Medications  Medication Sig Dispense Refill   Acetaminophen  (TYLENOL  PO) Take 500 mg by  mouth every 6 (six) hours.     amoxicillin -clavulanate (AUGMENTIN ) 875-125 MG tablet Take 1 tablet by mouth every 12 (twelve) hours. 14 tablet 0   buPROPion  (WELLBUTRIN  XL) 150 MG 24 hr tablet Take 1 tablet (150 mg total) by mouth every morning. 30 tablet 2   DULoxetine  (CYMBALTA ) 60 MG capsule Take 1 capsule (60 mg total) by mouth 2 (two) times daily. 60 capsule 2   eszopiclone  (LUNESTA ) 2 MG TABS tablet Take 1 tablet (2 mg total) by mouth at bedtime as needed for sleep. Take immediately before bedtime 30 tablet 1   gabapentin  (NEURONTIN ) 100 MG capsule Take 1 capsule (100 mg total) by mouth at bedtime as needed. May increase to twice daily as needed. 60 capsule 2   lidocaine -prilocaine  (EMLA ) cream Apply to affected area once (Patient taking differently: Apply 1 Application topically as needed (for chemo port, apply three hours before treatment).) 30 g 3   ondansetron  (ZOFRAN ) 8 MG tablet Take 1 tablet (8 mg total) by mouth every 8 (eight) hours as needed for nausea or vomiting. 30 tablet 1   prochlorperazine  (COMPAZINE ) 10 MG tablet Take 1 tablet (10 mg total) by mouth every 6 (six) hours as needed for nausea or vomiting. (Patient taking differently: Take 10 mg by mouth every 6 (six) hours as needed for nausea or vomiting. Have not started) 30 tablet 1   propranolol  (INDERAL ) 10 MG tablet TAKE 1 TABLET(10 MG) BY MOUTH TWICE DAILY AS NEEDED 60 tablet 2   Vitamin D , Ergocalciferol , (DRISDOL ) 1.25 MG (50000 UNIT) CAPS capsule Take 1 capsule (50,000 Units total) by mouth every 7 (seven) days. 12 capsule 3   No current facility-administered medications for this visit.     Psychiatric Specialty Exam: Review of Systems  There were no vitals taken for this visit.There is no height or weight on file to calculate BMI.  General Appearance: Well Groomed  Eye Contact:   Good  Speech:  Clear and Coherent and Normal Rate  Volume:  Normal  Mood:  Euthymic and dysthymic  Affect:  Congruent  Thought Process:  Coherent and Goal Directed  Orientation:  Full (Time, Place, and Person)  Thought Content: Logical and Rumination   Suicidal Thoughts:  No  Homicidal Thoughts:  No  Memory:  NA  Judgement:  Fair  Insight:  Fair  Psychomotor Activity:  Normal  Concentration:  Concentration: Good  Recall:  Good  Fund of Knowledge: Fair  Language: Good  Akathisia:  NA    AIMS (if indicated): not done  Assets:  Passenger transport manager Vocational/Educational  ADL's:  Intact  Cognition: WNL  Sleep:  Good   Metabolic Disorder Labs: Lab Results  Component Value Date   HGBA1C 5.9 06/15/2024   MPG 119.76 01/08/2024   MPG 116.89 09/25/2023   No results found for: PROLACTIN Lab Results  Component Value Date   CHOL 182 06/04/2024   TRIG 267 (H) 06/04/2024   HDL 30 (L) 06/04/2024   CHOLHDL 6.1 (H) 06/04/2024   VLDL 73 (H) 01/08/2024   LDLCALC 106 (H) 06/04/2024   LDLCALC 46 01/08/2024   Lab Results  Component Value Date   TSH 1.160 01/08/2024   TSH 3.16 05/08/2023    Therapeutic Level Labs: No results found for: LITHIUM No results found for: VALPROATE No results found for: CBMZ   Screenings: GAD-7    Flowsheet Row Office Visit from 09/10/2024 in [facility] Visit from [facility]  Nature conservation officer at [employer] from 06/02/2024 in [facility] Visit from 01/27/2024 in [facility] Visit from 07/18/2023 in [facility] [facility]  Total GAD-7 Score 12 4 3 14 13    PHQ2-9    Flowsheet Row Office Visit from 09/10/2024 in [facility] Visit from 06/15/2024 in [facility] from 06/02/2024 in [facility] Visit from 01/27/2024 in [facility] Visit from 07/18/2023 in [facility] [employer]  PHQ-2 Total Score 2 1 2 3 2   PHQ-9 Total Score 9 3 3 12 7    Flowsheet Row UC from 09/19/2024 in [facility] ED to Hosp-Admission (Discharged) from 01/08/2024 in [facility] Admission (Discharged) from 10/17/2023 in [facility]  C-SSRS RISK CATEGORY No Risk No Risk No Risk    Collaboration of Care: Collaboration of Care: Medication Management AEB medication prescription, Other provider involved in patient's care AEB oncology chart review, and Referral or follow-up with counselor/therapist AEB chart review  Patient/Guardian was advised Release of Information must be obtained prior to any record release in order to collaborate their care with an outside provider. Patient/Guardian was advised if they have not already done so to contact the registration department to sign all necessary forms in order for us  to release information regarding their care.   Consent: Patient/Guardian gives verbal consent for treatment and assignment of benefits for services provided during this visit. Patient/Guardian expressed understanding and agreed to proceed.    Arvella CHRISTELLA Finder, MD 10/08/2024, 12:03 PM   Virtual Visit via Video Note  I connected with Cory Gomez on 10/08/24 at 11:30 AM EDT by a video enabled telemedicine application and verified that I am speaking with the correct person using two identifiers.  Location: Patient: Home Provider: Home Office   I discussed the limitations of evaluation and management by telemedicine and the availability of in person appointments. The patient expressed understanding and agreed to proceed.   I discussed the  assessment and treatment plan with the patient. The patient was provided an opportunity to ask questions and all were answered. The patient agreed with the plan and demonstrated an understanding of the instructions.   The patient was advised to call back or seek an in-person evaluation if the symptoms worsen or if the condition fails to improve as anticipated.  I provided 30 minutes of non-face-to-face time during this encounter.   Arvella CHRISTELLA Finder, MD

## 2024-10-06 ENCOUNTER — Encounter (HOSPITAL_COMMUNITY): Payer: Self-pay | Admitting: Clinical

## 2024-10-06 ENCOUNTER — Ambulatory Visit (HOSPITAL_COMMUNITY): Admitting: Clinical

## 2024-10-06 DIAGNOSIS — F330 Major depressive disorder, recurrent, mild: Secondary | ICD-10-CM | POA: Diagnosis not present

## 2024-10-06 DIAGNOSIS — F411 Generalized anxiety disorder: Secondary | ICD-10-CM

## 2024-10-06 NOTE — Progress Notes (Addendum)
 THERAPIST PROGRESS NOTE  Session Time:  10:03am-10:23am   Session #22  Virtual Visit via Video Note  I connected with Levander Wonda PONCE Melburn on 10/10/24 at 10:00 AM EDT by a video enabled telemedicine application and verified that I am speaking with the correct person using two identifiers.  Location: Patient: home Provider: home office   I discussed the limitations of evaluation and management by telemedicine and the availability of in person appointments. The patient expressed understanding and agreed to proceed.   I discussed the assessment and treatment plan with the patient. The patient was provided an opportunity to ask questions and all were answered. The patient agreed with the plan and demonstrated an understanding of the instructions.   The patient was advised to call back or seek an in-person evaluation if the symptoms worsen or if the condition fails to improve as anticipated.  I provided 20 minutes of non-face-to-face time during this encounter.  [name], LCSW   Participation Level: Active  Behavioral Response: Casual Alert Euthymic   Type of Therapy: Individual Therapy  Treatment Goals Addressed:  New treatment goals established, current goals reviewed:  LTG: Explore personal core beliefs, rules and assumptions, and cognitive distortions through therapist using Cognitive Behavioral Therapy; learn how to develop replacement thoughts and challenge unhelpful thoughts.  LTG: Learn breathing techniques and grounding techniques at an age-appropriate level and demonstrate mastery in session then report independent use of these skills out of session.  STG: Score less than 9 on the PHQ-9 and less than 5 on the GAD-7 as evidenced by intermittent administration of the questionnaires to determine progress in managing depression and anxiety.  LTG: Learn and practice communication techniques such as active listening, I statements, open-ended questions, reflective  listening, assertiveness, fair fighting rules, initiating conversations, and more as necessary and taught in session.  LTG: Learn about boundary styles and types, how to implement them, and how to enforce them so that feels more empowered and content with being able to maintain more helpful, appropriate boundaries in the future for a more balanced result. LTG: Work to Arts development officer from models like CBT, Stages of Change, DBT, shame resilience theory, ACT, SFBT, MI, trauma-informed therapy and others to be able to manage mental health symptoms, AEB practicing out of session and reporting back. STG: Improve self-esteem by engaging in daily affirmations, developing new skills, gratitude journaling, use of SMART goals, increased assertiveness, challenging negative beliefs, and focusing on what patient can control  LTG: Discuss issues with insomnia and learn more about sleep hygiene; learn and practice tools until sleep is improved 5 nights out of 7 per self-report.  ProgressTowards Goals: Progressing  Interventions: Supportive and Other: sleep   Summary: Chapman Matteucci is a 43 y.o. male who presents with MDD/severe, GAD, insomnia, relationship problems with his wife due to her constantly leaving jobs, and recent completion of cancer treatments.  He presented oriented x5 and stated he was feeling good.  CSW evaluated patient's medication compliance, use of coping tools, and self-care, as applicable.  He provided an update on various aspects of his life that are normally discussed in therapy, including date of 10/20 when he will have CT scan to ensure his colon cancer is gone as well as to check the place on his lung that has been noticed by doctors.  He feels positive about everything being okay, anticipates being completely released to go back to work.  We explored the benefits and how he has managed to  stay very focused on the present throughout this process and he reported this is just the way  he has always been.  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  His wife has found a new job, but he is not placing expectations on her because he knows that could cause him resentment.  He stated that their new house is set up like they want and they are taking turns hosting events at their home with other church families.  They are happier living closer to their community of friends and have had a lot of fun.  He stated that they are keeping their new house clean because they now take pride in it, as opposed to how things were in their old home.  He has started having sleep problems again, attributes it to the nerve issues in his legs that resulted from chemotherapy.  We reviewed again several techniques to bore oneself to sleep, such as using the alphabet to list songs, animals, colors, or such.  He determined to try again.  Session was short, as he had nothing specific to review.  Suicidal/Homicidal: No without intent/plan  Therapist Response: Patient is progressing AEB engaging in scheduled therapy session.  Throughout the session, CSW gave patient the opportunity to explore thoughts and feelings associated with current life situations and past/present stressors.   CSW challenged patient gently and appropriately to consider different ways of looking at reported issues. CSW encouraged patient's expression of feelings and validated these using empathy, active listening, open body language, and unconditional positive regard.     Plan/Recommendations:  Return to therapy in 4 weeks to next scheduled appointment on 11/12, reflect on what was discussed in session, engage in self care behaviors as explored in session, do homework as assigned (use ABC's to go to sleep, continue use of coping skills that has found helpful), and return to next session prepared to talk about experience with new coping methods.   Diagnosis:  Encounter  Diagnoses  Name Primary?   Major depressive disorder, recurrent episode, mild Yes   GAD (generalized anxiety disorder)    Collaboration of Care: Psychiatrist AEB - psychiatrist and therapist both have access to notes in Epic  Patient/Guardian was advised Release of Information must be obtained prior to any record release in order to collaborate their care with an outside provider. Patient/Guardian was advised if they have not already done so to contact the registration department to sign all necessary forms in order for us  to release information regarding their care.   Consent: Patient/Guardian gives verbal consent for treatment and assignment of benefits for services provided during this visit. Patient/Guardian expressed understanding and agreed to proceed.   [name], LCSW 10/06/2024

## 2024-10-08 ENCOUNTER — Telehealth (HOSPITAL_COMMUNITY): Admitting: Psychiatry

## 2024-10-08 ENCOUNTER — Encounter (HOSPITAL_COMMUNITY): Payer: Self-pay | Admitting: Psychiatry

## 2024-10-08 DIAGNOSIS — F330 Major depressive disorder, recurrent, mild: Secondary | ICD-10-CM | POA: Diagnosis not present

## 2024-10-08 DIAGNOSIS — F411 Generalized anxiety disorder: Secondary | ICD-10-CM

## 2024-10-08 DIAGNOSIS — F5105 Insomnia due to other mental disorder: Secondary | ICD-10-CM | POA: Diagnosis not present

## 2024-10-08 MED ORDER — BUPROPION HCL ER (XL) 150 MG PO TB24
150.0000 mg | ORAL_TABLET | ORAL | 2 refills | Status: AC
Start: 2024-10-08 — End: ?

## 2024-10-08 MED ORDER — DULOXETINE HCL 60 MG PO CPEP: 60.0000 mg | ORAL_CAPSULE | Freq: Two times a day (BID) | ORAL | 2 refills | Status: DC

## 2024-10-08 MED ORDER — ESZOPICLONE 2 MG PO TABS: 2.0000 mg | ORAL_TABLET | Freq: Every evening | ORAL | 1 refills | Status: AC | PRN

## 2024-10-12 ENCOUNTER — Ambulatory Visit (HOSPITAL_COMMUNITY)
Admission: RE | Admit: 2024-10-12 | Discharge: 2024-10-12 | Disposition: A | Discharge status: Home / Self Care | Source: Ambulatory Visit | Attending: Nurse Practitioner | Admitting: Nurse Practitioner

## 2024-10-12 DIAGNOSIS — C187 Malignant neoplasm of sigmoid colon: Secondary | ICD-10-CM | POA: Insufficient documentation

## 2024-10-12 MED ORDER — IOHEXOL 300 MG/ML  SOLN
100.0000 mL | Freq: Once | INTRAMUSCULAR | Status: AC | PRN
Start: 2024-10-12 — End: 2024-10-12
  Administered 2024-10-12: 100 mL via INTRAVENOUS

## 2024-10-12 MED ORDER — IOHEXOL 9 MG/ML PO SOLN
500.0000 mL | ORAL | Status: AC
  Administered 2024-10-12 (×2): 500 mL via ORAL

## 2024-10-12 MED ORDER — SODIUM CHLORIDE (PF) 0.9 % IJ SOLN
INTRAMUSCULAR | Status: AC
  Filled 2024-10-12: qty 50

## 2024-10-20 ENCOUNTER — Ambulatory Visit: Payer: Self-pay | Admitting: Nurse Practitioner

## 2024-10-23 ENCOUNTER — Inpatient Hospital Stay: Attending: Nurse Practitioner

## 2024-10-23 DIAGNOSIS — Z85038 Personal history of other malignant neoplasm of large intestine: Secondary | ICD-10-CM | POA: Insufficient documentation

## 2024-10-23 DIAGNOSIS — Z452 Encounter for adjustment and management of vascular access device: Secondary | ICD-10-CM | POA: Diagnosis not present

## 2024-11-04 ENCOUNTER — Ambulatory Visit (INDEPENDENT_AMBULATORY_CARE_PROVIDER_SITE_OTHER): Admitting: Clinical

## 2024-11-04 DIAGNOSIS — F330 Major depressive disorder, recurrent, mild: Secondary | ICD-10-CM

## 2024-11-04 DIAGNOSIS — F411 Generalized anxiety disorder: Secondary | ICD-10-CM

## 2024-11-04 DIAGNOSIS — F5105 Insomnia due to other mental disorder: Secondary | ICD-10-CM

## 2024-11-05 ENCOUNTER — Encounter (HOSPITAL_COMMUNITY): Payer: Self-pay | Admitting: Clinical

## 2024-11-05 NOTE — Progress Notes (Signed)
 THERAPIST PROGRESS NOTE  Session Time:  8:08am-9:02am   Session #23  Participation Level: Active  Behavioral Response: Casual Alert Euthymic   Type of Therapy: Individual Therapy  Treatment Goals Addressed:  LTG: Explore personal core beliefs, rules and assumptions, and cognitive distortions through therapist using Cognitive Behavioral Therapy; learn how to develop replacement thoughts and challenge unhelpful thoughts.  LTG: Learn breathing techniques and grounding techniques at an age-appropriate level and demonstrate mastery in session then report independent use of these skills out of session.  STG: Score less than 9 on the PHQ-9 and less than 5 on the GAD-7 as evidenced by intermittent administration of the questionnaires to determine progress in managing depression and anxiety.  LTG: Learn and practice communication techniques such as active listening, I statements, open-ended questions, reflective listening, assertiveness, fair fighting rules, initiating conversations, and more as necessary and taught in session.  LTG: Learn about boundary styles and types, how to implement them, and how to enforce them so that feels more empowered and content with being able to maintain more helpful, appropriate boundaries in the future for a more balanced result. LTG: Work to arts development officer from models like CBT, Stages of Change, DBT, shame resilience theory, ACT, SFBT, MI, trauma-informed therapy and others to be able to manage mental health symptoms, AEB practicing out of session and reporting back. STG: Improve self-esteem by engaging in daily affirmations, developing new skills, gratitude journaling, use of SMART goals, increased assertiveness, challenging negative beliefs, and focusing on what patient can control  LTG: Discuss issues with insomnia and learn more about sleep hygiene; learn and practice tools until sleep is improved 5 nights out of 7 per self-report.  ProgressTowards Goals:  Progressing  Interventions: CBT, Supportive, and Other: sleep hygiene   Summary: Cory Gomez is a 43 y.o. male who presents with MDD/severe, GAD, insomnia, relationship problems with his wife due to her constantly leaving jobs, and recent completion of cancer treatments.  He presented oriented x5 and stated he was feeling as good as I can be  CSW evaluated patient's medication compliance, use of coping tools, and self-care, as applicable.  He provided an update on various aspects of his life that are normally discussed in therapy, including his lack of success so far in finding a job, his ongoing issues with sleep, the results of lung scans, having adopted another rescue dog, and ongoing development of neuropathy symptoms now spreading from right side to left side of his body.  We reviewed sleep hygiene to see what he has tried and not tried.  He is now taking Lunesta  again, is trying to do all calm activities for the 60 minutes before bed.  He gets up when he wakes up, rather than laying in bed and disturbing his wife's rest.  The spot on his lung seemed to be okay and they are keeping an eye on another.  He reported that he will continue getting disability checks through the end of 2025 so they will continue to be able to pay their bills, which provides him great relief.  But he has applied for many, has had a few interviews, and always seems to get stuck in the spot where a potential employer asks him about his 1-year gap in employment.  We brainstormed additional ways he might address that without giving too much information, such as instead of just saying It was a medical issue, perhaps expanding slightly to say I had an emergency medical situation which has been treated  and is now resolved.  He disclosed that the job search does depress him at times.  He also is bothered by the fact that his neuropathy seems to be spreading now to his other side of the body, is concerned about what that  could mean for his future employability.  As he talked about being good at reading people and making assumptions about what they think about him, CSW provided a review of the concepts in CBT and how we can go about challenging our thoughts to see if we need to reframe them, leading to new feelings.  He was given a handout on What to do with cognitive distortions and was asked to practice until next session.  Suicidal/Homicidal: No without intent/plan  Therapist Response: Patient is progressing AEB engaging in scheduled therapy session.  Throughout the session, CSW gave patient the opportunity to explore thoughts and feelings associated with current life situations and past/present stressors.   CSW challenged patient gently and appropriately to consider different ways of looking at reported issues. CSW encouraged patient's expression of feelings and validated these using empathy, active listening, open body language, and unconditional positive regard.     Plan/Recommendations:  Return to therapy in 4 weeks to next scheduled appointment on 11/26, reflect on what was discussed in session, engage in self care behaviors as explored in session, do homework as assigned (practice reframing thoughts, CBT style), and return to next session prepared to talk about experience with new coping methods.   Diagnosis:  Encounter Diagnoses  Name Primary?   Major depressive disorder, recurrent episode, mild Yes   GAD (generalized anxiety disorder)    Insomnia due to mental disorder    Collaboration of Care: Psychiatrist AEB - psychiatrist and therapist both have access to notes in Epic  Patient/Guardian was advised Release of Information must be obtained prior to any record release in order to collaborate their care with an outside provider. Patient/Guardian was advised if they have not already done so to contact the registration department to sign all necessary forms in order for us  to release information  regarding their care.   Consent: Patient/Guardian gives verbal consent for treatment and assignment of benefits for services provided during this visit. Patient/Guardian expressed understanding and agreed to proceed.   [name], LCSW 11/05/2024

## 2024-11-10 ENCOUNTER — Other Ambulatory Visit: Payer: Self-pay

## 2024-11-14 ENCOUNTER — Encounter (HOSPITAL_COMMUNITY): Payer: Self-pay

## 2024-11-16 ENCOUNTER — Ambulatory Visit (AMBULATORY_SURGERY_CENTER)

## 2024-11-16 VITALS — Ht 74.0 in | Wt 306.0 lb

## 2024-11-16 DIAGNOSIS — Z85038 Personal history of other malignant neoplasm of large intestine: Secondary | ICD-10-CM

## 2024-11-16 MED ORDER — PEG 3350-KCL-NA BICARB-NACL 420 G PO SOLR: 4000.0000 mL | Freq: Once | ORAL | 0 refills | Status: AC

## 2024-11-16 NOTE — Progress Notes (Signed)
 Pre visit completed in person; Patient verified name, DOB, and address; No egg or soy allergy known to patient;  No issues known to pt with past sedation with any surgeries or procedures; Patient denies ever being told they had issues or difficulty with intubation;  No FH of Malignant Hyperthermia; Pt is not on diet pills; Pt is not on home 02;  Pt is not on blood thinners;  Pt denies issues with constipation;  No A fib or A flutter; Have any cardiac testing pending--NO Insurance verified during PV appt--- [place] [employer] Pt can ambulate without assistance;  Pt denies use of chewing tobacco; Discussed diabetic/weight loss medication holds; Discussed NSAID holds; Checked BMI to be less than 50; Pt instructed to use [employer] or GoodRx for a price reduction on prep;  Patient's chart reviewed by Norleen Schillings CNRA prior to previsit and patient appropriate for the [facility];  Pre visit completed and red dot placed by patient's name on their procedure day (on provider's schedule);   Instructions sent to MyChart as well as printed and given to the patient;

## 2024-11-18 ENCOUNTER — Ambulatory Visit (INDEPENDENT_AMBULATORY_CARE_PROVIDER_SITE_OTHER): Admitting: Clinical

## 2024-11-18 DIAGNOSIS — F411 Generalized anxiety disorder: Secondary | ICD-10-CM

## 2024-11-18 DIAGNOSIS — F330 Major depressive disorder, recurrent, mild: Secondary | ICD-10-CM | POA: Diagnosis not present

## 2024-11-18 NOTE — Progress Notes (Signed)
 THERAPIST PROGRESS NOTE  Session Time:  9:05am - 10:00am  Session #24  Participation Level: Active  Behavioral Response: Casual Alert Euthymic   Type of Therapy: Individual Therapy  Treatment Goals Addressed:  LTG: Explore personal core beliefs, rules and assumptions, and cognitive distortions through therapist using Cognitive Behavioral Therapy; learn how to develop replacement thoughts and challenge unhelpful thoughts.  LTG: Learn breathing techniques and grounding techniques at an age-appropriate level and demonstrate mastery in session then report independent use of these skills out of session.  STG: Score less than 9 on the PHQ-9 and less than 5 on the GAD-7 as evidenced by intermittent administration of the questionnaires to determine progress in managing depression and anxiety.  LTG: Learn and practice communication techniques such as active listening, I statements, open-ended questions, reflective listening, assertiveness, fair fighting rules, initiating conversations, and more as necessary and taught in session.  LTG: Learn about boundary styles and types, how to implement them, and how to enforce them so that feels more empowered and content with being able to maintain more helpful, appropriate boundaries in the future for a more balanced result. LTG: Work to arts development officer from models like CBT, Stages of Change, DBT, shame resilience theory, ACT, SFBT, MI, trauma-informed therapy and others to be able to manage mental health symptoms, AEB practicing out of session and reporting back. STG: Improve self-esteem by engaging in daily affirmations, developing new skills, gratitude journaling, use of SMART goals, increased assertiveness, challenging negative beliefs, and focusing on what patient can control  LTG: Discuss issues with insomnia and learn more about sleep hygiene; learn and practice tools until sleep is improved 5 nights out of 7 per self-report.  ProgressTowards Goals:  Progressing  Interventions: CBT, Supportive, and Other: grief   Summary: Ural Acree is a 43 y.o. male who presents with MDD/severe, GAD, insomnia, relationship problems with his wife due to her constantly leaving jobs, and recent completion of cancer treatments.  He presented oriented x5 and stated he was feeling pretty good, a little nervous about having Thanksgiving at my house.  CSW evaluated patient's medication compliance, use of coping tools, and self-care, as applicable.  He provided an update on various aspects of his life that are normally discussed in therapy, including that he and his wife decided to host Thanksgiving dinner this year for the first time, his lack of success in finding a job yet, his cancer helping him to develop more respect for people in general, and grief over his father that has lasted 17-18 years.  He did his homework of checking his thoughts for cognitive distortions and stated that most of the time he found that his thoughts were based in something other than total reality.  This continues to help him look for the truth, he stated, and feel better when he finds it.  He was encouraged to keep doing this work.  He shared that last Thanksgiving he had a chemotherapy treatment the day before so he was quite sick and not sociable.  He was very angry because his father was not present to help him go through the cancer.  He and his wife stayed home and sort of ignored the holiday.  He has been going to his father's grave and talking to him, was told about the idea of writing a letter to him, or even a series of letters.  He disclosed for the first time that his father drank alcohol heavily and that because of this, he himself chooses  not to consume alcohol because he saw what it can do to families.  We discussed his grief and helped to process how it differs from year to year.  This time of year is the most difficult due to his father's death actually being around  Thanksgiving, on 11/27 to be specific, which is the date for Thanksgiving this year as well.  He continues to be thankful for his wife and how they are getting along.  In December there will be some tests to determine whether the cancer is completely gone, at which point he will not have to go back to the doctor for 6 months instead of the current 3.  Suicidal/Homicidal: No without intent/plan  Therapist Response: Patient is progressing AEB engaging in scheduled therapy session.  Throughout the session, CSW gave patient the opportunity to explore thoughts and feelings associated with current life situations and past/present stressors.   CSW challenged patient gently and appropriately to consider different ways of looking at reported issues. CSW encouraged patient's expression of feelings and validated these using empathy, active listening, open body language, and unconditional positive regard.     Plan/Recommendations:  Return to therapy in 2 weeks to next scheduled appointment on 12/10, reflect on what was discussed in session, engage in self care behaviors as explored in session, do homework as assigned (continue to practice reframing thoughts, CBT style, decide on ways to express his love to deceased father), and return to next session prepared to talk about experience with new coping methods.   Diagnosis:  Encounter Diagnoses  Name Primary?   Major depressive disorder, recurrent episode, mild Yes   GAD (generalized anxiety disorder)     Collaboration of Care: Psychiatrist AEB - psychiatrist and therapist both have access to notes in Epic  Patient/Guardian was advised Release of Information must be obtained prior to any record release in order to collaborate their care with an outside provider. Patient/Guardian was advised if they have not already done so to contact the registration department to sign all necessary forms in order for us  to release information regarding their care.   Consent:  Patient/Guardian gives verbal consent for treatment and assignment of benefits for services provided during this visit. Patient/Guardian expressed understanding and agreed to proceed.   [name], LCSW 11/21/2024

## 2024-11-21 ENCOUNTER — Encounter (HOSPITAL_COMMUNITY): Payer: Self-pay | Admitting: Clinical

## 2024-11-24 ENCOUNTER — Ambulatory Visit: Admitting: Internal Medicine

## 2024-11-24 ENCOUNTER — Encounter: Payer: Self-pay | Admitting: Internal Medicine

## 2024-11-24 VITALS — BP 147/81 | HR 84 | Temp 97.8°F | Resp 13 | Ht 74.0 in | Wt 306.0 lb

## 2024-11-24 DIAGNOSIS — Z85038 Personal history of other malignant neoplasm of large intestine: Secondary | ICD-10-CM

## 2024-11-24 DIAGNOSIS — F418 Other specified anxiety disorders: Secondary | ICD-10-CM | POA: Diagnosis not present

## 2024-11-24 DIAGNOSIS — Z1211 Encounter for screening for malignant neoplasm of colon: Secondary | ICD-10-CM

## 2024-11-24 DIAGNOSIS — E669 Obesity, unspecified: Secondary | ICD-10-CM | POA: Diagnosis not present

## 2024-11-24 MED ORDER — SODIUM CHLORIDE 0.9 % IV SOLN: 500.0000 mL | Freq: Once | INTRAVENOUS | Status: DC

## 2024-11-24 NOTE — Op Note (Signed)
 [facility] Patient Name: Cory Gomez Procedure Date: 11/24/2024 9:42 AM MRN: [id number] Endoscopist: Gordy CHRISTELLA Starch , MD, [id number] Age: 43 Referring MD:  Date of Birth: April 14, 1981 Gender: Male Account #: [id number] Procedure:                Colonoscopy Indications:              High risk colon cancer surveillance: Personal                            history of colon cancer (sigmoid Oct 2024 s/p                            resection), no previous colonoscopy Medicines:                Monitored Anesthesia Care Procedure:                Pre-Anesthesia Assessment:                           - Prior to the procedure, a History and Physical                            was performed, and patient medications and                            allergies were reviewed. The patient's tolerance of                            previous anesthesia was also reviewed. The risks                            and benefits of the procedure and the sedation                            options and risks were discussed with the patient.                            All questions were answered, and informed consent                            was obtained. Prior Anticoagulants: The patient has                            taken no anticoagulant or antiplatelet agents. ASA                            Grade Assessment: II - A patient with mild systemic                            disease. After reviewing the risks and benefits,                            the patient was deemed in satisfactory condition to  undergo the procedure.                           After obtaining informed consent, the colonoscope                            was passed under direct vision. Throughout the                            procedure, the patient's blood pressure, pulse, and                            oxygen saturations were monitored continuously. The                            Olympus CF-HQ190L ([id number])  Colonoscope was                            introduced through the anus with the intention of                            advancing to the cecum. The scope was advanced to                            the transverse colon before the procedure was                            aborted. Medications were given. The colonoscopy                            was performed without difficulty. The patient                            tolerated the procedure well. The quality of the                            bowel preparation was poor. Scope In: 9:55:34 AM Scope Out: 9:58:49 AM Total Procedure Duration: 0 hours 3 minutes 15 seconds  Findings:                 The digital rectal exam was normal.                           A moderate amount of semi-liquid semi-solid stool                            was found in the entire colon, precluding                            visualization.                           Retroflexion in the rectum was not performed due to  aborting the procedure before completion. Complications:            No immediate complications. Estimated Blood Loss:     Estimated blood loss: none. Impression:               - Preparation of the colon was poor.                           - Stool in the entire examined colon.                           - No specimens collected. Recommendation:           - Patient has a contact number available for                            emergencies. The signs and symptoms of potential                            delayed complications were discussed with the                            patient. Return to normal activities tomorrow.                            Written discharge instructions were provided to the                            patient.                           - Clear liquid diet.                           - Continue present medications.                           - Repeat colonoscopy tomorrow after additional                             bowel preparation. Gordy CHRISTELLA Starch, MD 11/24/2024 10:08:10 AM This report has been signed electronically.

## 2024-11-24 NOTE — Patient Instructions (Signed)
 YOU HAD AN ENDOSCOPIC PROCEDURE TODAY AT THE [facility]:   Refer to the procedure report that was given to you for any specific questions about what was found during the examination.  If the procedure report does not answer your questions, please call your gastroenterologist to clarify.  If you requested that your care partner not be given the details of your procedure findings, then the procedure report has been included in a sealed envelope for you to review at your convenience later.  YOU SHOULD EXPECT: Some feelings of bloating in the abdomen. Passage of more gas than usual.  Walking can help get rid of the air that was put into your GI tract during the procedure and reduce the bloating. If you had a lower endoscopy (such as a colonoscopy or flexible sigmoidoscopy) you may notice spotting of blood in your stool or on the toilet paper. If you underwent a bowel prep for your procedure, you may not have a normal bowel movement for a few days.  Please Note:  You might notice some irritation and congestion in your nose or some drainage.  This is from the oxygen used during your procedure.  There is no need for concern and it should clear up in a day or so.  SYMPTOMS TO REPORT IMMEDIATELY:  Following lower endoscopy (colonoscopy or flexible sigmoidoscopy):  Excessive amounts of blood in the stool  Significant tenderness or worsening of abdominal pains  Swelling of the abdomen that is new, acute  Fever of 100F or higher  For urgent or emergent issues, a gastroenterologist can be reached at any hour by calling [phone]. Do not use MyChart messaging for urgent concerns.    DIET:  We do recommend a small meal at first, but then you may proceed to your regular diet.  Drink plenty of fluids but you should avoid alcoholic beverages for 24 hours.  ACTIVITY:  You should plan to take it easy for the rest of today and you should NOT DRIVE or use heavy machinery until tomorrow (because of  the sedation medicines used during the test).    FOLLOW UP: Our staff will call the number listed on your records the next business day following your procedure.  We will call around 7:15- 8:00 am to check on you and address any questions or concerns that you may have regarding the information given to you following your procedure. If we do not reach you, we will leave a message.     If any biopsies were taken you will be contacted by phone or by letter within the next 1-3 weeks.  Please call us  at [phone] if you have not heard about the biopsies in 3 weeks.    SIGNATURES/CONFIDENTIALITY: You and/or your care partner have signed paperwork which will be entered into your electronic medical record.  These signatures attest to the fact that that the information above on your After Visit Summary has been reviewed and is understood.  Full responsibility of the confidentiality of this discharge information lies with you and/or your care-partner.YOU HAD AN ENDOSCOPIC PROCEDURE TODAY AT THE [facility]:   Refer to the procedure report that was given to you for any specific questions about what was found during the examination.  If the procedure report does not answer your questions, please call your gastroenterologist to clarify.  If you requested that your care partner not be given the details of your procedure findings, then the procedure report has been included in a  sealed envelope for you to review at your convenience later.  YOU SHOULD EXPECT: Some feelings of bloating in the abdomen. Passage of more gas than usual.  Walking can help get rid of the air that was put into your GI tract during the procedure and reduce the bloating. If you had a lower endoscopy (such as a colonoscopy or flexible sigmoidoscopy) you may notice spotting of blood in your stool or on the toilet paper. If you underwent a bowel prep for your procedure, you may not have a normal bowel movement for a few  days.  Please Note:  You might notice some irritation and congestion in your nose or some drainage.  This is from the oxygen used during your procedure.  There is no need for concern and it should clear up in a day or so.  SYMPTOMS TO REPORT IMMEDIATELY:  Following lower endoscopy (colonoscopy or flexible sigmoidoscopy):  Excessive amounts of blood in the stool  Significant tenderness or worsening of abdominal pains  Swelling of the abdomen that is new, acute  Fever of 100F or higher  Following upper endoscopy (EGD)  Vomiting of blood or coffee ground material  New chest pain or pain under the shoulder blades  Painful or persistently difficult swallowing  New shortness of breath  Fever of 100F or higher  Black, tarry-looking stools  For urgent or emergent issues, a gastroenterologist can be reached at any hour by calling [phone]. Do not use MyChart messaging for urgent concerns.    DIET:  We do recommend a small meal at first, but then you may proceed to your regular diet.  Drink plenty of fluids but you should avoid alcoholic beverages for 24 hours.  ACTIVITY:  You should plan to take it easy for the rest of today and you should NOT DRIVE or use heavy machinery until tomorrow (because of the sedation medicines used during the test).    FOLLOW UP: Our staff will call the number listed on your records the next business day following your procedure.  We will call around 7:15- 8:00 am to check on you and address any questions or concerns that you may have regarding the information given to you following your procedure. If we do not reach you, we will leave a message.     If any biopsies were taken you will be contacted by phone or by letter within the next 1-3 weeks.  Please call us  at [phone] if you have not heard about the biopsies in 3 weeks.    SIGNATURES/CONFIDENTIALITY: You and/or your care partner have signed paperwork which will be entered into your electronic  medical record.  These signatures attest to the fact that that the information above on your After Visit Summary has been reviewed and is understood.  Full responsibility of the confidentiality of this discharge information lies with you and/or your care-partner.

## 2024-11-24 NOTE — Progress Notes (Signed)
 GASTROENTEROLOGY PROCEDURE H&P NOTE   Primary Care Physician: Gretel App, NP    Reason for Procedure:  History of colon cancer, complicated by perforated status post sigmoidectomy, poor prep at attempted colonoscopy 14 months ago  Plan:    Colonoscopy  Patient is appropriate for endoscopic procedure(s) in the ambulatory ([facility]) setting.  The nature of the procedure, as well as the risks, benefits, and alternatives were carefully and thoroughly reviewed with the patient. Ample time for discussion and questions allowed.  All questions were answered. The patient understood, was satisfied, and agreed with the plan to proceed.    HPI: Cory Gomez is a 43 y.o. male who presents for colonoscopy.  Medical history as below.  Tolerated the prep.  No recent chest pain or shortness of breath.  No abdominal pain today.  Past Medical History:  Diagnosis Date   ADHD (attention deficit hyperactivity disorder)    Annual physical exam 09/09/2019   Anxiety and depression    Cancer (HCC)    Colon cancer (HCC)    COVID-19    12/28/19   Depression, recurrent 09/09/2019   Headache 12/31/2021   History of kidney stones    Hypertriglyceridemia 07/17/2021   Insomnia    Inverted nipple 09/16/2019   Kidney stone    Mastoiditis of both sides    Nystagmus 01/22/2022   Obesity (BMI 30-39.9)    Obesity (BMI 30-39.9)    Perforated sigmoid colon (HCC) 07/06/2023   Suicidal ideation 07/14/2021   Vertigo    hx of   Vitamin D  deficiency     Past Surgical History:  Procedure Laterality Date   COLON RESECTION  09/25/2023   colon cancer   COLONOSCOPY  09/2023   Lurie Mullane-MAC-suprep (poor)-1 yr recall   PORTACATH PLACEMENT Right 10/17/2023   Procedure: INSERTION PORT-A-CATH WITH MATILDA GUIDANCE;  Surgeon: Sheldon Standing, MD;  Location: [facility];  Service: General;  Laterality: Right;   PROCTOSCOPY N/A 09/25/2023   Procedure: RIGID PROCTOSCOPY;  Surgeon: Sheldon Standing, MD;   Location: [facility];  Service: General;  Laterality: N/A;    Prior to Admission medications   Medication Sig Start Date End Date Taking? Authorizing Provider  Acetaminophen  (TYLENOL  PO) Take 500 mg by mouth every 6 (six) hours.   Yes [provider]  buPROPion  (WELLBUTRIN  XL) 150 MG 24 hr tablet Take 1 tablet (150 mg total) by mouth every morning. 10/08/24  Yes Carvin Arvella HERO, MD  DULoxetine  (CYMBALTA ) 60 MG capsule Take 1 capsule (60 mg total) by mouth 2 (two) times daily. 10/08/24 01/06/25 Yes Carvin Arvella HERO, MD  eszopiclone  (LUNESTA ) 2 MG TABS tablet Take 1 tablet (2 mg total) by mouth at bedtime as needed for sleep. Take immediately before bedtime 10/08/24 10/08/25 Yes Carvin Arvella HERO, MD  gabapentin  (NEURONTIN ) 100 MG capsule Take 1 capsule (100 mg total) by mouth at bedtime as needed. May increase to twice daily as needed. 09/10/24  Yes Gretel App, NP  Vitamin D , Ergocalciferol , (DRISDOL ) 1.25 MG (50000 UNIT) CAPS capsule Take 1 capsule (50,000 Units total) by mouth every 7 (seven) days. 03/29/24  Yes Hope Merle, MD  lidocaine -prilocaine  (EMLA ) cream Apply to affected area once 10/09/23   Lanny Callander, MD  ondansetron  (ZOFRAN ) 8 MG tablet Take 1 tablet (8 mg total) by mouth every 8 (eight) hours as needed for nausea or vomiting. 03/04/24   Lanny Callander, MD  prochlorperazine  (COMPAZINE ) 10 MG tablet Take 1 tablet (10 mg total) by mouth every 6 (six)  hours as needed for nausea or vomiting. 10/09/23   Lanny Callander, MD  propranolol  (INDERAL ) 10 MG tablet TAKE 1 TABLET(10 MG) BY MOUTH TWICE DAILY AS NEEDED Patient taking differently: Take 10 mg by mouth daily as needed. 01/27/24   Carvin Arvella HERO, MD    Current Outpatient Medications  Medication Sig Dispense Refill   Acetaminophen  (TYLENOL  PO) Take 500 mg by mouth every 6 (six) hours.     buPROPion  (WELLBUTRIN  XL) 150 MG 24 hr tablet Take 1 tablet (150 mg total) by mouth every morning. 30 tablet 2   DULoxetine  (CYMBALTA ) 60 MG capsule Take 1  capsule (60 mg total) by mouth 2 (two) times daily. 60 capsule 2   eszopiclone  (LUNESTA ) 2 MG TABS tablet Take 1 tablet (2 mg total) by mouth at bedtime as needed for sleep. Take immediately before bedtime 30 tablet 1   gabapentin  (NEURONTIN ) 100 MG capsule Take 1 capsule (100 mg total) by mouth at bedtime as needed. May increase to twice daily as needed. 60 capsule 2   Vitamin D , Ergocalciferol , (DRISDOL ) 1.25 MG (50000 UNIT) CAPS capsule Take 1 capsule (50,000 Units total) by mouth every 7 (seven) days. 12 capsule 3   lidocaine -prilocaine  (EMLA ) cream Apply to affected area once 30 g 3   ondansetron  (ZOFRAN ) 8 MG tablet Take 1 tablet (8 mg total) by mouth every 8 (eight) hours as needed for nausea or vomiting. 30 tablet 1   prochlorperazine  (COMPAZINE ) 10 MG tablet Take 1 tablet (10 mg total) by mouth every 6 (six) hours as needed for nausea or vomiting. 30 tablet 1   propranolol  (INDERAL ) 10 MG tablet TAKE 1 TABLET(10 MG) BY MOUTH TWICE DAILY AS NEEDED (Patient taking differently: Take 10 mg by mouth daily as needed.) 60 tablet 2   Current Facility-Administered Medications  Medication Dose Route Frequency Provider Last Rate Last Admin   0.9 %  sodium chloride  infusion  500 mL Intravenous Once Jaquane Boughner, Gordy HERO, MD        Allergies as of 11/24/2024 - Review Complete 11/24/2024  Allergen Reaction Noted   Ambien  [zolpidem ] Other (See Comments) 01/08/2024    Family History  Problem Relation Age of Onset   Colon polyps Mother    Diabetes Mother    Hypertension Mother    Hyperlipidemia Mother    Diabetes Mellitus II Mother    Anxiety disorder Mother    Depression Mother    Basal cell carcinoma Mother 67 - 62   Stroke Father    Alcoholism Father    Anxiety disorder Brother    Depression Brother    Hypertension Brother    Hyperlipidemia Brother    Diabetes type II Brother    ADD / ADHD Brother    Squamous cell carcinoma Maternal Grandmother    Dementia Maternal Grandfather    Basal cell  carcinoma Maternal Grandfather    Prostate cancer Maternal Grandfather 54   Lung cancer Paternal Grandfather 2       smoked   Stomach cancer Neg Hx    Colon cancer Neg Hx    Esophageal cancer Neg Hx    Pancreatic cancer Neg Hx    Rectal cancer Neg Hx     Social History   Socioeconomic History   Marital status: Married    Spouse name: Not on file   Number of children: Not on file   Years of education: Not on file   Highest education level: Not on file  Occupational History   Not on  file  Tobacco Use   Smoking status: Never   Smokeless tobacco: Never  Vaping Use   Vaping status: Never Used  Substance and Sexual Activity   Alcohol use: Never   Drug use: Never   Sexual activity: Yes    Partners: Female  Other Topics Concern   Not on file  Social History Narrative   Married no kids wife is Ricky Gallery    -wife and mother Luke Paling on [place]      Has siblings    Works Educational Psychologist IT [employer]   1 dog    Social Drivers of Corporate Investment Banker Strain: High Risk (10/11/2023)   Overall Financial Resource Strain (CARDIA)    Difficulty of Paying Living Expenses: Very hard  Food Insecurity: No Food Insecurity (01/08/2024)   Hunger Vital Sign    Worried About Running Out of Food in the Last Year: Never true    Ran Out of Food in the Last Year: Never true  Transportation Needs: No Transportation Needs (01/08/2024)   PRAPARE - Administrator, Civil Service (Medical): No    Lack of Transportation (Non-Medical): No  Physical Activity: Not on file  Stress: Not on file  Social Connections: Moderately Integrated (01/08/2024)   Social Connection and Isolation Panel    Frequency of Communication with Friends and Family: More than three times a week    Frequency of Social Gatherings with Friends and Family: Three times a week    Attends Religious Services: More than 4 times per year    Active Member of Clubs or Organizations: No    Attends Banker  Meetings: Never    Marital Status: Married  Catering Manager Violence: Not At Risk (01/08/2024)   Humiliation, Afraid, Rape, and Kick questionnaire    Fear of Current or Ex-Partner: No    Emotionally Abused: No    Physically Abused: No    Sexually Abused: No    Physical Exam: Vital signs in last 24 hours: @BP  (!) 148/97   Pulse 88   Temp 97.8 F (36.6 C)   Ht 6' 2 (1.88 m)   Wt (!) 306 lb (138.8 kg)   SpO2 99%   BMI 39.29 kg/m  GEN: NAD EYE: Sclerae anicteric ENT: MMM CV: Non-tachycardic Pulm: CTA b/l GI: Soft, NT/ND NEURO:  Alert & Oriented x 3   Gordy Starch, MD [facility]  11/24/2024 9:45 AM

## 2024-11-24 NOTE — Progress Notes (Signed)
 A/o x 3, VSS, good SR's, pleased with anesthesia, report to RN

## 2024-11-24 NOTE — Progress Notes (Signed)
 Pt's states no medical or surgical changes since previsit or office visit.

## 2024-11-24 NOTE — Progress Notes (Signed)
 Discharge instructions explained to patient and his mother. Patient aware to return tomorrow, 11/25/24, by 8:00 AM for repeat procedure with Dr. Avram. Prep instructions explained to patient/mother- both verbalized understanding. Pt aware to drink clear liquids only.

## 2024-11-25 ENCOUNTER — Encounter: Payer: Self-pay | Admitting: Internal Medicine

## 2024-11-25 ENCOUNTER — Telehealth: Payer: Self-pay

## 2024-11-25 ENCOUNTER — Ambulatory Visit: Admitting: Internal Medicine

## 2024-11-25 VITALS — BP 131/92 | HR 84 | Temp 97.1°F | Resp 21 | Ht 74.0 in | Wt 306.0 lb

## 2024-11-25 DIAGNOSIS — D123 Benign neoplasm of transverse colon: Secondary | ICD-10-CM | POA: Diagnosis not present

## 2024-11-25 DIAGNOSIS — Z85038 Personal history of other malignant neoplasm of large intestine: Secondary | ICD-10-CM | POA: Diagnosis not present

## 2024-11-25 DIAGNOSIS — D120 Benign neoplasm of cecum: Secondary | ICD-10-CM

## 2024-11-25 DIAGNOSIS — D122 Benign neoplasm of ascending colon: Secondary | ICD-10-CM

## 2024-11-25 DIAGNOSIS — Z1211 Encounter for screening for malignant neoplasm of colon: Secondary | ICD-10-CM | POA: Diagnosis not present

## 2024-11-25 MED ORDER — SODIUM CHLORIDE 0.9 % IV SOLN: 500.0000 mL | INTRAVENOUS | Status: DC

## 2024-11-25 NOTE — Progress Notes (Signed)
 History and Physical Interval Note:  11/25/2024 9:05 AM  Cory Gomez  has presented today for endoscopic procedure(s), with the diagnosis of  Encounter Diagnosis  Name Primary?   Personal history of colon cancer Yes  .  The various methods of evaluation and treatment have been discussed with the patient and/or family. After consideration of risks, benefits and other options for treatment, the patient has consented to  the endoscopic procedure(s).  He was seen and attemptd colonoscopy yesterday aborted due to poor prep.   The patient's history has been reviewed, patient examined, no change in status, stable for endoscopic procedure(s).  I have reviewed the patient's chart and labs.  Questions were answered to the patient's satisfaction.     Lupita CHARLENA Commander, MD, NOLIA

## 2024-11-25 NOTE — Progress Notes (Signed)
 Pt's states no medical or surgical changes since previsit or office visit.

## 2024-11-25 NOTE — Progress Notes (Signed)
 Called to room to assist during endoscopic procedure.  Patient ID and intended procedure confirmed with present staff. Received instructions for my participation in the procedure from the performing physician.

## 2024-11-25 NOTE — Patient Instructions (Addendum)
 I found and removed 4 small polyps they all look benign but I suspect they are what we call precancerous.  I will have them analyzed.  You should repeat a colonoscopy in 3 years.  I appreciate the opportunity to care for you. Lupita CHARLENA Commander, MD, FACG    YOU HAD AN ENDOSCOPIC PROCEDURE TODAY AT THE [facility]:   Refer to the procedure report that was given to you for any specific questions about what was found during the examination.  If the procedure report does not answer your questions, please call your gastroenterologist to clarify.  If you requested that your care partner not be given the details of your procedure findings, then the procedure report has been included in a sealed envelope for you to review at your convenience later.  YOU SHOULD EXPECT: Some feelings of bloating in the abdomen. Passage of more gas than usual.  Walking can help get rid of the air that was put into your GI tract during the procedure and reduce the bloating. If you had a lower endoscopy (such as a colonoscopy or flexible sigmoidoscopy) you may notice spotting of blood in your stool or on the toilet paper. If you underwent a bowel prep for your procedure, you may not have a normal bowel movement for a few days.  Please Note:  You might notice some irritation and congestion in your nose or some drainage.  This is from the oxygen used during your procedure.  There is no need for concern and it should clear up in a day or so.  SYMPTOMS TO REPORT IMMEDIATELY:  Following lower endoscopy (colonoscopy or flexible sigmoidoscopy):  Excessive amounts of blood in the stool  Significant tenderness or worsening of abdominal pains  Swelling of the abdomen that is new, acute  Fever of 100F or higher  For urgent or emergent issues, a gastroenterologist can be reached at any hour by calling [phone]. Do not use MyChart messaging for urgent concerns.    DIET:  We do recommend a small meal at first, but  then you may proceed to your regular diet.  Drink plenty of fluids but you should avoid alcoholic beverages for 24 hours.  ACTIVITY:  You should plan to take it easy for the rest of today and you should NOT DRIVE or use heavy machinery until tomorrow (because of the sedation medicines used during the test).    FOLLOW UP: Our staff will call the number listed on your records the next business day following your procedure.  We will call around 7:15- 8:00 am to check on you and address any questions or concerns that you may have regarding the information given to you following your procedure. If we do not reach you, we will leave a message.     If any biopsies were taken you will be contacted by phone or by letter within the next 1-3 weeks.  Please call us  at [phone] if you have not heard about the biopsies in 3 weeks.    SIGNATURES/CONFIDENTIALITY: You and/or your care partner have signed paperwork which will be entered into your electronic medical record.  These signatures attest to the fact that that the information above on your After Visit Summary has been reviewed and is understood.  Full responsibility of the confidentiality of this discharge information lies with you and/or your care-partner.

## 2024-11-25 NOTE — Progress Notes (Signed)
 Sedate, gd SR, tolerated procedure well, VSS, report to RN

## 2024-11-25 NOTE — Telephone Encounter (Signed)
  Follow up Call-     11/24/2024    8:59 AM 09/24/2023    7:54 AM  Call back number  Post procedure Call Back phone  # [phone]  Permission to leave phone message Yes Yes     Patient questions:  Do you have a fever, pain , or abdominal swelling? No. Pain Score  0 *  Have you tolerated food without any problems? Yes.    Have you been able to return to your normal activities? Yes.    Do you have any questions about your discharge instructions: Diet   No. Medications  No. Follow up visit  No.  Do you have questions or concerns about your Care? No.  Actions: * If pain score is 4 or above: No action needed, pain <4.

## 2024-11-25 NOTE — Op Note (Signed)
 [facility] Patient Name: Cory Gomez Procedure Date: 11/25/2024 9:02 AM MRN: [id number] Endoscopist: Lupita FORBES Commander , MD, [id number] Age: 43 Referring MD:  Date of Birth: 11-07-1981 Gender: Male Account #: [id number] Procedure:                Colonoscopy Indications:              High risk colon cancer surveillance: Personal                            history of colon cancer Medicines:                Monitored Anesthesia Care Procedure:                Pre-Anesthesia Assessment:                           - Prior to the procedure, a History and Physical                            was performed, and patient medications and                            allergies were reviewed. The patient's tolerance of                            previous anesthesia was also reviewed. The risks                            and benefits of the procedure and the sedation                            options and risks were discussed with the patient.                            All questions were answered, and informed consent                            was obtained. Prior Anticoagulants: The patient has                            taken no anticoagulant or antiplatelet agents. ASA                            Grade Assessment: III - A patient with severe                            systemic disease. After reviewing the risks and                            benefits, the patient was deemed in satisfactory                            condition to undergo the procedure.  After obtaining informed consent, the colonoscope                            was passed under direct vision. Throughout the                            procedure, the patient's blood pressure, pulse, and                            oxygen saturations were monitored continuously. The                            Olympus CF-HQ190L ([id number]) Colonoscope was                            introduced through the anus and advanced to  the the                            cecum, identified by appendiceal orifice and                            ileocecal valve. The colonoscopy was performed                            without difficulty. The patient tolerated the                            procedure well. The quality of the bowel                            preparation was adequate. The bowel preparation                            used was GoLYTELY and Miralax via extended prep                            with split dose instruction. The ileocecal valve,                            appendiceal orifice, and rectum were photographed. Scope In: 9:12:06 AM Scope Out: 9:25:54 AM Scope Withdrawal Time: 0 hours 11 minutes 41 seconds  Total Procedure Duration: 0 hours 13 minutes 48 seconds  Findings:                 The perianal and digital rectal examinations were                            normal. Pertinent negatives include normal prostate                            (size, shape, and consistency).                           Two sessile polyps were found in the transverse  colon and ascending colon. The polyps were 5 to 7                            mm in size. These polyps were removed with a cold                            snare. Resection and retrieval were complete.                            Verification of patient identification for the                            specimen was done. Estimated blood loss was minimal.                           Two sessile polyps were found in the ascending                            colon and cecum. The polyps were 1 to 2 mm in size.                            These polyps were removed with a cold biopsy                            forceps. Resection and retrieval were complete.                            Verification of patient identification for the                            specimen was done. Estimated blood loss was minimal.                           There was evidence  of a prior end-to-end                            colo-colonic anastomosis in the proximal rectum.                            This was patent and was characterized by healthy                            appearing mucosa.                           The exam was otherwise without abnormality on                            direct and retroflexion views. Complications:            No immediate complications. Estimated Blood Loss:     Estimated blood loss was minimal. Impression:               - Two 5 to 7 mm  polyps in the transverse colon and                            in the ascending colon, removed with a cold snare.                            Resected and retrieved.                           - Two 1 to 2 mm polyps in the ascending colon and                            in the cecum, removed with a cold biopsy forceps.                            Resected and retrieved.                           - Patent end-to-end colo-colonic anastomosis,                            characterized by healthy appearing mucosa.                           - The examination was otherwise normal on direct                            and retroflexion views.                           - Personal history of malignant neoplasm of the                            colon. [id number] sigmoid cancer resected 09/2023 Recommendation:           - Patient has a contact number available for                            emergencies. The signs and symptoms of potential                            delayed complications were discussed with the                            patient. Return to normal activities tomorrow.                            Written discharge instructions were provided to the                            patient.                           - Resume previous diet.                           -  Continue present medications.                           - Await pathology results.                           - Repeat colonoscopy in 3 years for  surveillance.                            Dr. Albertus - would use different preps - consider                            daily Linzess or MiraLax week before and double                            prep - he had MiraLax/Golytely and poor prep on                            12/2, another MiraLax prep yesterday and prep                            adequate today Lupita FORBES Commander, MD 11/25/2024 9:40:33 AM This report has been signed electronically.

## 2024-11-26 ENCOUNTER — Telehealth: Payer: Self-pay | Admitting: *Deleted

## 2024-11-26 NOTE — Telephone Encounter (Signed)
  Follow up Call-     11/25/2024    8:05 AM 11/24/2024    8:59 AM 09/24/2023    7:54 AM  Call back number  Post procedure Call Back phone  # [phone]  Permission to leave phone message Yes Yes Yes     Patient questions:  Do you have a fever, pain , or abdominal swelling? No. Pain Score  0 *  Have you tolerated food without any problems? Yes.    Have you been able to return to your normal activities? Yes.    Do you have any questions about your discharge instructions: Diet   No. Medications  No. Follow up visit  No.  Do you have questions or concerns about your Care? No.  Actions: * If pain score is 4 or above: No action needed, pain <4.

## 2024-11-27 LAB — SURGICAL PATHOLOGY

## 2024-11-30 ENCOUNTER — Ambulatory Visit: Payer: Self-pay | Admitting: Internal Medicine

## 2024-11-30 NOTE — Progress Notes (Unsigned)
 BH MD/PA/NP OP Progress Note  11/30/2024 8:32 AM Cory Gomez  MRN:  [id number]  Visit Diagnosis:  No diagnosis found.  [name] is a 43 y.o. male with a history of MDD, GAD, ADHD, binge eating disorder, colon cancer stage 3b in treatment, and Vit D deficiency who presented to [facility] at [facility] for initial evaluation on 01/15/2023.  At initial evaluation patient reported neurovegetative symptoms of depression including low mood, anhedonia, amotivation, worthlessness, insomnia, increased appetite, and occasional passive SI.  He denied any intent or plan.  Patient was able to complete a safety plan and lists his mother as a good support.  Patient furthermore endorsed symptoms of anxiety including feeling nervous or on edge, being unable to stop or control his worrying, difficulty relaxing, increased irritability, restlessness, and fears that something awful might happen.  Psychosocially patient has increased stress from financial struggles, difficulty at work, and interpersonal struggles in his marriage.  Patient meets criteria for MDD and generalized anxiety disorder with further clarity needed to rule out ADHD.  He was referred for neuropsych testing.  Of note due to patient's sleep troubles and body habitus there is some concern for sleep apnea and he was referred to a sleep specialist.  Cory Gomez presents for follow-up evaluation. Today, 11/30/24, patient    reports that anxiety has been stable while mood has been slightly declined recently.  This is due to the ongoing stressors of recover from chemotherapy and difficulty finding employment.  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  Patient was started on gabapentin  by PCP to manage this.  He has not taken Lunesta  though would like to restart given poor sleep.  Reviewed risk and benefits and  interactions with gabapentin .  Patient is aware and would plan to transition to solely gabapentin  if dose was increased past 300 in the future.  Will continue remainder of current regimen and follow-up in 2 months.  Psychotherapeutic interventions were used during today's session. From 11:36 AM to 11:59 AM. Therapeutic interventions included empathic listening and supportive therapy. Used supportive interviewing techniques to provide emotional validation. Improvement was evidenced by patient's participation and identified commitment to therapy goals.   Plan: - Continue Cymbalta  to 60 mg BID - Continue Wellbutrin  XL 150 mg every day - Conitnue propranolol  10 mg BID prn for anxiety - Continue Lunesta  2 mg QHS as needed for insomnia, last filled prescription was in October 2025 - Gabapentin  100-200 mg prescribed by his PCP - Neuropsych referral - Sleep specialist appointment delayed until cancer piece is resolved  - Continue therapy with Elgie Crest twice a month - Going to support groups for people with cancer - Crisis resources reviewed - Follow up in 2 months  Chief Complaint:  No chief complaint on file.  HPI: Cory Gomez presents reporting that    he is ok all considering. He had a CT a few months ago that showed a small nodule on his lung. He has another scan next week. If the next scan shows it is unchanged or reduced then he would be considered to be in the clear, only requiring standard monitoring.  Chemo has caused some nerve damage that makes it hard for him to grasp things. He also feels tingling in the hand on the right side of his body. It has been effecting his sleep and now he is only getting 3-4 hours a night.  He has not bee taking the Lunesta  as he had run out, and had the goal of not getting back on it. He has been started on gabapentin  for the nerve pain at 100 mg.  He has had some benefit from nerve pain but not any impact on sleep.  Patient is interested in getting back  on Lunesta  at this point.  He is aware of the risk and interactions between Lunesta  and gabapentin .  He would not plan to increase the dose past 300 mg while taking both.  If he were to go higher on the gabapentin  then he would plan to discontinue Lunesta .  He has been applying for jobs and has gotten a lot of rejection letters. The year and a half of unemployment due to medical issues he thinks is a red flag he thinks. He has had interviews that went well, but did not get the jobs. He would really prefer to work. Looking into SSI/SSDI if he loses the long term disability and he can not get another job. His long term disability provider is assisting with this and they are pursuing it related to the cancer and side effects from treatment.  He is frustrated with the need to depend on people to care for himself when he is. He does feel down that he is not getting the opportunity to show his worth. Empathic listening techniques were used.  Things with her partner are going well. While things are still tight financially they are stable with the assistance of the long term disability.   Past Psychiatric History: First episode of depression was in early elementary school after his parents divorced. He was in counseling and prescribed bupropion  then. Later, as an adult, he was briefly on fluoxetine with no clear benefit. While in school he was diagnosed with having ADHD inattentive type and was on Ritalin. He had seen Dr. Lynnetta from 2020-2022. He has no hx of inpatient psychiatric admissions, no hx of mania or overt psychosis. Denies any prior suicide attempts.   Has tried Adderall , Vyvanse , Ritalin, bupropion , Prozac, Zoloft , Cymbalta , trazodone , Zolpidem , topirimate, Lunesta    He does not abuse alcohol or street drugs.  Past Medical History:  Past Medical History:  Diagnosis Date   ADHD (attention deficit hyperactivity disorder)    Annual physical exam 09/09/2019   Anxiety and depression    Cancer  (HCC)    Colon cancer (HCC)    COVID-19    12/28/19   Depression, recurrent 09/09/2019   Headache 12/31/2021   History of kidney stones    Hypertriglyceridemia 07/17/2021   Insomnia    Inverted nipple 09/16/2019   Kidney stone    Mastoiditis of both sides    Nystagmus 01/22/2022   Obesity (BMI 30-39.9)    Obesity (BMI 30-39.9)    Perforated sigmoid colon (HCC) 07/06/2023   Suicidal ideation 07/14/2021   Vertigo    hx of   Vitamin D  deficiency     Past Surgical History:  Procedure Laterality Date   COLON RESECTION  09/25/2023   colon cancer   COLONOSCOPY  09/2023   Pyrtle-MAC-suprep (poor)-1 yr recall   PORTACATH PLACEMENT Right 10/17/2023   Procedure: INSERTION PORT-A-CATH WITH MATILDA GUIDANCE;  Surgeon: Sheldon Standing, MD;  Location: [facility];  Service: General;  Laterality: Right;   PROCTOSCOPY N/A 09/25/2023   Procedure: RIGID PROCTOSCOPY;  Surgeon: Sheldon Standing, MD;  Location: [facility];  Service: General;  Laterality: N/A;    Family History:  Family History  Problem  Relation Age of Onset   Colon polyps Mother    Diabetes Mother    Hypertension Mother    Hyperlipidemia Mother    Diabetes Mellitus II Mother    Anxiety disorder Mother    Depression Mother    Basal cell carcinoma Mother 37 - 77   Stroke Father    Alcoholism Father    Anxiety disorder Brother    Depression Brother    Hypertension Brother    Hyperlipidemia Brother    Diabetes type II Brother    ADD / ADHD Brother    Squamous cell carcinoma Maternal Grandmother    Dementia Maternal Grandfather    Basal cell carcinoma Maternal Grandfather    Prostate cancer Maternal Grandfather 34   Lung cancer Paternal Grandfather 5       smoked   Stomach cancer Neg Hx    Colon cancer Neg Hx    Esophageal cancer Neg Hx    Pancreatic cancer Neg Hx    Rectal cancer Neg Hx     Social History:  Social History   Socioeconomic History   Marital status: Married    Spouse name: Not on file    Number of children: Not on file   Years of education: Not on file   Highest education level: Not on file  Occupational History   Not on file  Tobacco Use   Smoking status: Never   Smokeless tobacco: Never  Vaping Use   Vaping status: Never Used  Substance and Sexual Activity   Alcohol use: Never   Drug use: Never   Sexual activity: Yes    Partners: Female  Other Topics Concern   Not on file  Social History Narrative   Married no kids wife is Geroge Gilliam    -wife and mother Luke Paling on [place]      Has siblings    Works Educational Psychologist IT [employer]   1 dog    Social Drivers of Corporate Investment Banker Strain: High Risk (10/11/2023)   Overall Financial Resource Strain (CARDIA)    Difficulty of Paying Living Expenses: Very hard  Food Insecurity: No Food Insecurity (01/08/2024)   Hunger Vital Sign    Worried About Running Out of Food in the Last Year: Never true    Ran Out of Food in the Last Year: Never true  Transportation Needs: No Transportation Needs (01/08/2024)   PRAPARE - Administrator, Civil Service (Medical): No    Lack of Transportation (Non-Medical): No  Physical Activity: Not on file  Stress: Not on file  Social Connections: Moderately Integrated (01/08/2024)   Social Connection and Isolation Panel    Frequency of Communication with Friends and Family: More than three times a week    Frequency of Social Gatherings with Friends and Family: Three times a week    Attends Religious Services: More than 4 times per year    Active Member of Clubs or Organizations: No    Attends Banker Meetings: Never    Marital Status: Married    Allergies:  Allergies  Allergen Reactions   Ambien  [Zolpidem ] Other (See Comments)    Pt requests we do not give this medication.     Current Medications: Current Outpatient Medications  Medication Sig Dispense Refill   Acetaminophen  (TYLENOL  PO) Take 500 mg by mouth every 6 (six) hours.     buPROPion   (WELLBUTRIN  XL) 150 MG 24 hr tablet Take 1 tablet (150 mg total) by  mouth every morning. 30 tablet 2   DULoxetine  (CYMBALTA ) 60 MG capsule Take 1 capsule (60 mg total) by mouth 2 (two) times daily. 60 capsule 2   eszopiclone  (LUNESTA ) 2 MG TABS tablet Take 1 tablet (2 mg total) by mouth at bedtime as needed for sleep. Take immediately before bedtime 30 tablet 1   gabapentin  (NEURONTIN ) 100 MG capsule Take 1 capsule (100 mg total) by mouth at bedtime as needed. May increase to twice daily as needed. 60 capsule 2   lidocaine -prilocaine  (EMLA ) cream Apply to affected area once 30 g 3   ondansetron  (ZOFRAN ) 8 MG tablet Take 1 tablet (8 mg total) by mouth every 8 (eight) hours as needed for nausea or vomiting. 30 tablet 1   prochlorperazine  (COMPAZINE ) 10 MG tablet Take 1 tablet (10 mg total) by mouth every 6 (six) hours as needed for nausea or vomiting. 30 tablet 1   propranolol  (INDERAL ) 10 MG tablet TAKE 1 TABLET(10 MG) BY MOUTH TWICE DAILY AS NEEDED (Patient taking differently: Take 10 mg by mouth daily as needed.) 60 tablet 2   Vitamin D , Ergocalciferol , (DRISDOL ) 1.25 MG (50000 UNIT) CAPS capsule Take 1 capsule (50,000 Units total) by mouth every 7 (seven) days. 12 capsule 3   No current facility-administered medications for this visit.     Psychiatric Specialty Exam: Review of Systems  There were no vitals taken for this visit.There is no height or weight on file to calculate BMI.  General Appearance: Well Groomed  Eye Contact:  Good  Speech:  Clear and Coherent and Normal Rate  Volume:  Normal  Mood:  Euthymic and dysthymic  Affect:  Congruent  Thought Process:  Coherent and Goal Directed  Orientation:  Full (Time, Place, and Person)  Thought Content: Logical and Rumination   Suicidal Thoughts:  No  Homicidal Thoughts:  No  Memory:  NA  Judgement:  Fair  Insight:  Fair  Psychomotor Activity:  Normal  Concentration:  Concentration: Good  Recall:  Good  Fund of Knowledge: Fair   Language: Good  Akathisia:  NA    AIMS (if indicated): not done  Assets:  Passenger Transport Manager Vocational/Educational  ADL's:  Intact  Cognition: WNL  Sleep:  Good   Metabolic Disorder Labs: Lab Results  Component Value Date   HGBA1C 5.9 06/15/2024   MPG 119.76 01/08/2024   MPG 116.89 09/25/2023   No results found for: PROLACTIN Lab Results  Component Value Date   CHOL 182 06/04/2024   TRIG 267 (H) 06/04/2024   HDL 30 (L) 06/04/2024   CHOLHDL 6.1 (H) 06/04/2024   VLDL 73 (H) 01/08/2024   LDLCALC 106 (H) 06/04/2024   LDLCALC 46 01/08/2024   Lab Results  Component Value Date   TSH 1.160 01/08/2024   TSH 3.16 05/08/2023    Therapeutic Level Labs: No results found for: LITHIUM No results found for: VALPROATE No results found for: CBMZ   Screenings: GAD-7    Flowsheet Row Office Visit from 09/10/2024 in [facility] [employer] Visit from 06/15/2024 in [facility] [employer] [facility] from 06/02/2024 in [facility] Visit from 01[facility]27/2024 in [facility] Visit from 07/18/2023 in [facility]  Total GAD-7 Score 12 4 3 14 13    PHQ2-9    Flowsheet Row Office Visit from 09/10/2024 in [facility]  Visit from 06/15/2024 in [facility] [employer] from 06/02/2024 in [facility] at [facility] Visit from 01/27/2024 in [facility] [employer] Visit from 07/18/2023 in [facility] [employer] Total Score 2 1 2 3 2   PHQ-9 Total Score 9 3 3 12 7    Flowsheet Row UC from 09/19/2024 in [facility] ED to Hosp-Admission (Discharged) from  01/08/2024 in [facility] Admission (Discharged) from 10/17/2023 in [facility]  C-SSRS RISK CATEGORY No Risk No Risk No Risk    Collaboration of Care: Collaboration of Care: Medication Management AEB medication prescription, Other provider involved in patient's care AEB oncology chart review, and Referral or follow-up with counselor/therapist AEB chart review  Patient/Guardian was advised Release of Information must be obtained prior to any record release in order to collaborate their care with an outside provider. Patient/Guardian was advised if they have not already done so to contact the registration department to sign all necessary forms in order for us  to release information regarding their care.   Consent: Patient/Guardian gives verbal consent for treatment and assignment of benefits for services provided during this visit. Patient/Guardian expressed understanding and agreed to proceed.    Arvella CHRISTELLA Finder, MD 11/30/2024, 8:32 AM   Virtual Visit via Video Note  I connected with Cory Gomez on 11/30/24 at  8:30 AM EST by a video enabled telemedicine application and verified that I am speaking with the correct person using two identifiers.  Location: Patient: Home Provider: Home Office   I discussed the limitations of evaluation and management by telemedicine and the availability of in person appointments. The patient expressed understanding and agreed to proceed.   I discussed the assessment and treatment plan with the patient. The patient was provided an opportunity to ask questions and all were answered. The patient agreed with the plan and demonstrated an understanding of the instructions.   The patient was advised to call back or seek an in-person evaluation if the symptoms worsen or if the condition fails to improve as anticipated.  I provided 30 minutes of non-face-to-face time during this encounter.   Arvella CHRISTELLA Finder, MD

## 2024-12-02 ENCOUNTER — Encounter (HOSPITAL_COMMUNITY): Payer: Self-pay | Admitting: Clinical

## 2024-12-02 ENCOUNTER — Ambulatory Visit (INDEPENDENT_AMBULATORY_CARE_PROVIDER_SITE_OTHER): Admitting: Clinical

## 2024-12-02 DIAGNOSIS — F330 Major depressive disorder, recurrent, mild: Secondary | ICD-10-CM | POA: Diagnosis not present

## 2024-12-02 DIAGNOSIS — F411 Generalized anxiety disorder: Secondary | ICD-10-CM | POA: Diagnosis not present

## 2024-12-02 NOTE — Progress Notes (Unsigned)
 THERAPIST PROGRESS NOTE  Session Time:  9:00am - 10:00am  Session #25  Participation Level: Active  Behavioral Response: Casual Alert Euthymic   Type of Therapy: Individual Therapy  Treatment Goals Addressed:  LTG: Explore personal core beliefs, rules and assumptions, and cognitive distortions through therapist using Cognitive Behavioral Therapy; learn how to develop replacement thoughts and challenge unhelpful thoughts.  LTG: Learn breathing techniques and grounding techniques at an age-appropriate level and demonstrate mastery in session then report independent use of these skills out of session.  STG: Score less than 9 on the PHQ-9 and less than 5 on the GAD-7 as evidenced by intermittent administration of the questionnaires to determine progress in managing depression and anxiety.  LTG: Learn and practice communication techniques such as active listening, I statements, open-ended questions, reflective listening, assertiveness, fair fighting rules, initiating conversations, and more as necessary and taught in session.  LTG: Learn about boundary styles and types, how to implement them, and how to enforce them so that feels more empowered and content with being able to maintain more helpful, appropriate boundaries in the future for a more balanced result. LTG: Work to arts development officer from models like CBT, Stages of Change, DBT, shame resilience theory, ACT, SFBT, MI, trauma-informed therapy and others to be able to manage mental health symptoms, AEB practicing out of session and reporting back. STG: Improve self-esteem by engaging in daily affirmations, developing new skills, gratitude journaling, use of SMART goals, increased assertiveness, challenging negative beliefs, and focusing on what patient can control  LTG: Discuss issues with insomnia and learn more about sleep hygiene; learn and practice tools until sleep is improved 5 nights out of 7 per self-report.  ProgressTowards Goals:  Progressing  Interventions: Supportive and Other: Exposure Response Prevention, brain psychoeducation   Summary: Cory Gomez is a 43 y.o. male who presents with MDD/severe, GAD, insomnia, relationship problems with his wife due to her constantly leaving jobs, and recent completion of cancer treatments.  He presented oriented x5 and stated he was feeling proud, Thanksgiving at my house turned out great.  CSW evaluated patient's medication compliance, use of coping tools, and self-care, as applicable.  He provided an update on various aspects of his life that are normally discussed in therapy, including his hosting of Thanksgiving that was successful, his positive colonoscopy result, and his frustration that his neuropathy continues to spread.  The pain is as bad as 8-9 on a 1-10 scale sometimes.  He expressed fear of increasing his Gabapentin  dose, but sees the oncologist tomorrow and may end up doing so.  He really does not want to take opiates.  Because he was dwelling on how frightening it is to just go into the Cancer Center now, and that he has to fight against leaving in a panic, CSW offered to help the patient understand what is going on in his brain when he is having anxiety.  CSW explained the brain to him using The Hand Model (Dr. Rolan Punch), to which he was able to verbalize understanding more about the components of the brain and how it sends messages.  CSW then played a video explaining Exposure Response Prevention and how that provides relief in the brain, by [employer], LCSW.  Finally, to demonstrate how it works CSW showed him parts of a video wherein this model is used for someone to overcome a phobia.  He found the videos and the brain explanation to be helpful in understanding himself better.  CSW challenged him  to drive from this office to the Cancer Center across the street and just go in to the lobby, sit for 5 minutes to allow himself to feel the anxiety, sit with that  anxiety without challenging it, then go ahead and leave.  He agreed to do this and to report next session about his experience.  Suicidal/Homicidal: No without intent/plan  Therapist Response: Patient is progressing AEB engaging in scheduled therapy session.  Throughout the session, CSW gave patient the opportunity to explore thoughts and feelings associated with current life situations and past/present stressors.   CSW challenged patient gently and appropriately to consider different ways of looking at reported issues. CSW encouraged patients expression of feelings and validated these using empathy, active listening, open body language, and unconditional positive regard.     Plan/Recommendations:  Return to therapy at next scheduled appointment on 2/4, reflect on what was discussed in session, engage in self care behaviors as explored in session, do homework as assigned (go to [facility], sit in his anxiety without challenging it, then leave), and return to next session prepared to talk about experience with new coping methods.   Diagnosis:  Encounter Diagnoses  Name Primary?   Major depressive disorder, recurrent episode, mild Yes   GAD (generalized anxiety disorder)     Collaboration of Care: Psychiatrist AEB - psychiatrist and therapist both have access to notes in Epic  Patient/Guardian was advised Release of Information must be obtained prior to any record release in order to collaborate their care with an outside provider. Patient/Guardian was advised if they have not already done so to contact the registration department to sign all necessary forms in order for us  to release information regarding their care.   Consent: Patient/Guardian gives verbal consent for treatment and assignment of benefits for services provided during this visit. Patient/Guardian expressed understanding and agreed to proceed.   [name], LCSW 12/02/2024

## 2024-12-03 ENCOUNTER — Encounter (HOSPITAL_COMMUNITY): Payer: Self-pay | Admitting: Psychiatry

## 2024-12-03 ENCOUNTER — Other Ambulatory Visit: Payer: Self-pay

## 2024-12-03 ENCOUNTER — Inpatient Hospital Stay: Attending: Nurse Practitioner

## 2024-12-03 ENCOUNTER — Telehealth (HOSPITAL_COMMUNITY): Admitting: Psychiatry

## 2024-12-03 ENCOUNTER — Inpatient Hospital Stay: Attending: Nurse Practitioner | Admitting: Hematology

## 2024-12-03 VITALS — BP 137/99 | HR 91 | Temp 98.1°F | Resp 18 | Ht 74.0 in | Wt 310.9 lb

## 2024-12-03 DIAGNOSIS — F330 Major depressive disorder, recurrent, mild: Secondary | ICD-10-CM

## 2024-12-03 DIAGNOSIS — Z08 Encounter for follow-up examination after completed treatment for malignant neoplasm: Secondary | ICD-10-CM | POA: Insufficient documentation

## 2024-12-03 DIAGNOSIS — C187 Malignant neoplasm of sigmoid colon: Secondary | ICD-10-CM

## 2024-12-03 DIAGNOSIS — Z85038 Personal history of other malignant neoplasm of large intestine: Secondary | ICD-10-CM | POA: Diagnosis present

## 2024-12-03 DIAGNOSIS — F411 Generalized anxiety disorder: Secondary | ICD-10-CM | POA: Diagnosis not present

## 2024-12-03 DIAGNOSIS — G620 Drug-induced polyneuropathy: Secondary | ICD-10-CM | POA: Diagnosis not present

## 2024-12-03 LAB — CBC WITH DIFFERENTIAL/PLATELET
Abs Immature Granulocytes: 0.02 K/uL (ref 0.00–0.07)
Basophils Absolute: 0 K/uL (ref 0.0–0.1)
Basophils Relative: 1 %
Eosinophils Absolute: 0.2 K/uL (ref 0.0–0.5)
Eosinophils Relative: 3 %
HCT: 39 % (ref 39.0–52.0)
Hemoglobin: 14.1 g/dL (ref 13.0–17.0)
Immature Granulocytes: 0 %
Lymphocytes Relative: 24 %
Lymphs Abs: 1.4 K/uL (ref 0.7–4.0)
MCH: 31.1 pg (ref 26.0–34.0)
MCHC: 36.2 g/dL — ABNORMAL HIGH (ref 30.0–36.0)
MCV: 85.9 fL (ref 80.0–100.0)
Monocytes Absolute: 0.4 K/uL (ref 0.1–1.0)
Monocytes Relative: 7 %
Neutro Abs: 4 K/uL (ref 1.7–7.7)
Neutrophils Relative %: 65 %
Platelets: 245 K/uL (ref 150–400)
RBC: 4.54 MIL/uL (ref 4.22–5.81)
RDW: 13.2 % (ref 11.5–15.5)
WBC: 6.1 K/uL (ref 4.0–10.5)
nRBC: 0 % (ref 0.0–0.2)

## 2024-12-03 LAB — COMPREHENSIVE METABOLIC PANEL WITH GFR
ALT: 39 U/L (ref 0–44)
AST: 30 U/L (ref 15–41)
Albumin: 4.5 g/dL (ref 3.5–5.0)
Alkaline Phosphatase: 69 U/L (ref 38–126)
Anion gap: 9 (ref 5–15)
BUN: 9 mg/dL (ref 6–20)
CO2: 26 mmol/L (ref 22–32)
Calcium: 9.2 mg/dL (ref 8.9–10.3)
Chloride: 101 mmol/L (ref 98–111)
Creatinine, Ser: 0.81 mg/dL (ref 0.61–1.24)
GFR, Estimated: >60 mL/min (ref >60)
Glucose, Bld: 105 mg/dL — ABNORMAL HIGH (ref 70–99)
Potassium: 4 mmol/L (ref 3.5–5.1)
Sodium: 136 mmol/L (ref 135–145)
Total Bilirubin: 0.4 mg/dL (ref 0.0–1.2)
Total Protein: 7.5 g/dL (ref 6.5–8.1)

## 2024-12-03 LAB — CEA (ACCESS): CEA (CHCC): <1 ng/mL (ref 0.00–5.00)

## 2024-12-03 LAB — MISCELLANEOUS TEST

## 2024-12-03 MED ORDER — GABAPENTIN 100 MG PO CAPS: 200.0000 mg | ORAL_CAPSULE | Freq: Three times a day (TID) | ORAL | 1 refills | Status: AC

## 2024-12-03 MED ORDER — DULOXETINE HCL 60 MG PO CPEP: 60.0000 mg | ORAL_CAPSULE | Freq: Two times a day (BID) | ORAL | 2 refills | Status: AC

## 2024-12-03 MED ORDER — BUPROPION HCL ER (XL) 150 MG PO TB24: 150.0000 mg | ORAL_TABLET | ORAL | 2 refills | Status: AC

## 2024-12-03 NOTE — Assessment & Plan Note (Signed)
-  [id number] stage IIIB, MMS, G2 -Presented with perforated diverticulitis in sigmoid colon in July, 2024. -Patient underwent laparoscopic resection of sigmoid colon, path reviewed moderate differentiated adenocarcinoma with 1 positive lymph nodes. -I recommend adjuvant chemotherapy FOLFOX for 6 months and he completed in 03/2024 -Surveillance CT scan in April, July and Oct 2025 were negative for recurrence.

## 2024-12-03 NOTE — Progress Notes (Signed)
 [facility]   [phone]) [phone] Fax:([phone]) [phone]   Clinic Follow up Note   Patient Care Team: Gretel App, NP as PCP - General (Nurse Practitioner) Sheldon Standing, MD as Consulting Physician (General Surgery) Abran Norleen SAILOR, MD as Consulting Physician (Gastroenterology) Lanny Callander, MD as Consulting Physician (Medical Oncology) Margaret Eduard SAUNDERS, MD as Consulting Physician (Neurology) Llewellyn Gerard LABOR, DO as Consulting Physician (Otolaryngology)  Date of Service:  12/03/2024  CHIEF COMPLAINT: f/u of colon cancer  CURRENT THERAPY:  Cancer surveillance  Oncology History   Cancer of sigmoid colon - pT4a, pN1a - s/p roboric LAR resection 09/27/2023 -pT4aN1M0 stage IIIB, MMS, G2 -Presented with perforated diverticulitis in sigmoid colon in July, 2024. -Patient underwent laparoscopic resection of sigmoid colon, path reviewed moderate differentiated adenocarcinoma with 1 positive lymph nodes. -I recommend adjuvant chemotherapy FOLFOX for 6 months and he completed in 03/2024 -Surveillance CT scan in April, July and Oct 2025 were negative for recurrence.   Assessment & Plan Sigmoid colon cancer in remission He remains in remission following chemotherapy and surgery, with no evidence of recurrence on recent CT, colonoscopy, or Signatera testing. He is asymptomatic gastrointestinally, with normal bowel function, no abdominal pain, and normalized blood counts. Functional status is ECOG 2, with no oncologic restrictions on activity; neuropathy is the primary limitation for return to work. - Ordered surveillance CT scan for April, approximately six months from prior imaging. - Scheduled follow-up in four months to coincide with next surveillance scan. - Ordered routine blood work, including Signatera, every 3-4 months for early recurrence detection. - Instructed to flush port in two months; procedure scheduled. - Provided anticipatory guidance regarding scan result  release and follow-up for abnormal labs. - Reinforced absence of oncologic restrictions on return to work; return to work contingent on functional status and neuropathy.  Chemotherapy-induced peripheral neuropathy He has chronic, severe, and progressively worsening neuropathy secondary to prior chemotherapy, now generalized but most pronounced in hands and feet. Pain is 8-9/10, significantly impairing quality of life and ability to return to work. Gabapentin  at low dose and duloxetine  have provided partial relief. Neuropathy is recognized as persistent, with slow and difficult nerve regeneration. - Increased gabapentin  dose: instructed to titrate to two capsules (200 mg) twice daily, with option to increase to three capsules (300 mg) three times daily as tolerated, monitoring for drowsiness. - Provided prescription for increased gabapentin  with refills. - Advised regular, not as-needed, gabapentin  dosing, with dose adjustment based on symptom severity and activity. - Discussed drowsiness as main gabapentin  side effect; recommended titrating evening dose first to assess tolerance. - Recommended over-the-counter vitamin B complex or B12 supplementation to support nerve health. - Encouraged regular physical activity and home-based exercises (walking, stationary bike, swimming, hand exercises, stretching, muscular exercises) to promote circulation and nerve repair. - Offered physical therapy referral if symptoms persist or additional support is needed; instructed to call if interested. - Discussed neuropathy as primary barrier to return to work; advised considering part-time work as tolerated based on functional status.   Plan - He is clinically doing well except Moderator neuropathy, will increase gabapentin  dose, and he will continue Cymbalta .  I also encouraged him to exercise, and take over-the-counter B complex -Continue port flush every 8 weeks -Follow-up in 4 months with lab and surveillance CT  scan 1 week before -Signatera results still pending today, next in 4 months  SUMMARY OF ONCOLOGIC HISTORY: Oncology History  Cancer of sigmoid colon - pT4a, pN1a - s/p roboric LAR resection 09/27/2023  09/25/2023 Cancer Staging   Staging form: Colon and Rectum, AJCC 8th Edition - Pathologic stage from 09/25/2023: Stage IIIB (pT4a, pN1a, cM0) - Signed by Hanford Powell BRAVO, NP on 10/09/2023 Total positive nodes: 1 Histologic grading system: 4 grade system Histologic grade (G): G2 Residual tumor (R): R0 - None   09/25/2023 Surgery   Laparoscopic resection of sigmoid colon  Invasive, moderately differentiated adenocarcinoma of sigmoid colon. Invades into werosal surface. Margins are clear. Negative lymphovascular and perineural invasion. Metastasis to 1/45 lymph nodes.    09/25/2023 Pathology Results   FINAL MICROSCOPIC DIAGNOSIS:  A.   COLON, RESECTION: - Invasive moderately differentiated adenocarcinoma, 7.8 cm involving sigmoid colon - Carcinoma invades into the serosal surface - Resection margins are negative for carcinoma - Negative for lymphovascular or perineural invasion - Metastatic carcinoma to one of forty-five lymph nodes (1/45) - See oncology table B.   COLON, DISTAL MARGIN, EXCISION: - Colonic donut within normal limits, negative for carcinoma  ONCOLOGY TABLE:  COLON AND RECTUM, CARCINOMA:  Resection, Including Transanal Disk Excision of Rectal Neoplasms  Procedure: Resection, rectosigmoid colon Tumor Site: Sigmoid colon Tumor Size: 7.8 cm Macroscopic Tumor Perforation: Not identified  Histologic Type: Adenocarcinoma Histologic Grade: G2: Moderately differentiated Multiple Primary Sites: Not applicable Tumor Extension: Carcinoma invades into the serosal surface Lymphovascular Invasion: Not identified Perineural Invasion: Not identified Treatment Effect: No known presurgical therapy Margins:      Margin Status for Invasive Carcinoma: All margins negative  for invasive carcinoma      Margin Status for Non-Invasive Tumor: All margins negative for high-grade dysplasia / intramucosal           carcinoma and low-grade dysplasia Regional Lymph Nodes:      Number of Lymph Nodes with Tumor: 1      Number of Lymph Nodes Examined: 45 Tumor Deposits: Not identified Distant Metastasis:      Distant Site(s) Involved: Not applicable Pathologic Stage Classification (pTNM, AJCC 8th Edition): pT4a, pN1a Ancillary Studies: MMR / MSI testing will be ordered. Representative Tumor Block: A2 Comments: None (v4.2.0.1)     09/30/2023 Initial Diagnosis   Cancer of sigmoid colon - pT4a, pN1a - s/p roboric LAR resection 09/27/2023   10/08/2023 Imaging   CT Chest with contrast  IMPRESSION: Negative. No evidence of thoracic metastatic disease or other significant abnormality.   10/23/2023 -  Chemotherapy   Patient is on Treatment Plan : COLORECTAL FOLFOX q14d x 6 months      Genetic Testing   [facility] CancerNext-Expanded Panel+RNA was Negative. Report date is 11/15/2023.   The CancerNext-Expanded gene panel offered by [facility] and includes sequencing, rearrangement, and RNA analysis for the following 71 genes: AIP, ALK, APC, ATM, AXIN2, BAP1, BARD1, BMPR1A, BRCA1, BRCA2, BRIP1, CDC73, CDH1, CDK4, CDKN1B, CDKN2A, CHEK2, CTNNA1, DICER1, FH, FLCN, KIF1B, LZTR1, MAX, MEN1, MET, MLH1, MSH2, MSH3, MSH6, MUTYH, NF1, NF2, NTHL1, PALB2, PHOX2B, PMS2, POT1, PRKAR1A, PTCH1, PTEN, RAD51C, RAD51D, RB1, RET, SDHA, SDHAF2, SDHB, SDHC, SDHD, SMAD4, SMARCA4, SMARCB1, SMARCE1, STK11, SUFU, TMEM127, TP53, TSC1, TSC2, and VHL (sequencing and deletion/duplication); EGFR, EGLN1, HOXB13, KIT, MITF, PDGFRA, POLD1, and POLE (sequencing only); EPCAM and GREM1 (deletion/duplication only).        Discussed the use of AI scribe software for clinical note transcription with the patient, who gave verbal consent to proceed.  History of Present Illness Cory Gomez is a  43 year old male with stage II sigmoid colon cancer in remission who presents for follow-up of worsening chemotherapy-induced peripheral  neuropathy.  Recent CT imaging and colonoscopy were negative for recurrence. Blood counts are normal and serial Signatera assays, most recently in July, have been negative. He denies gastrointestinal symptoms. Bowel movements are normal.  He has severe, progressively worsening chemotherapy-induced peripheral neuropathy, which began in his hands and feet and is now described as spreading throughout his body. Pain is 8-9 out of 10. Gabapentin  100 mg twice daily provides partial relief, although symptoms persist and he sometimes skips doses when pain is less severe. He is on long-term duloxetine . Neuropathy significantly impairs function, but he remains ambulatory and able to use his hands. He takes vitamin D  and B complex or B12 supplements. He denies other pain, weakness, or new neurological symptoms.  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 He stays physically active with walking, stationary bike, swimming, and hand exercises, and is open to physical therapy if symptoms do not improve.     All other systems were reviewed with the patient and are negative.  MEDICAL HISTORY:  Past Medical History:  Diagnosis Date   ADHD (attention deficit hyperactivity disorder)    Annual physical exam 09/09/2019   Anxiety and depression    Cancer (HCC)    Colon cancer (HCC)    COVID-19    12/28/19   Depression, recurrent 09/09/2019   Headache 12/31/2021   History of kidney stones    Hypertriglyceridemia 07/17/2021   Insomnia    Inverted nipple 09/16/2019   Kidney stone    Mastoiditis of both sides    Nystagmus 01/22/2022   Obesity (BMI 30-39.9)    Obesity (BMI 30-39.9)    Perforated sigmoid colon (HCC) 07/06/2023   Suicidal ideation 07/14/2021    Vertigo    hx of   Vitamin D  deficiency     SURGICAL HISTORY: Past Surgical History:  Procedure Laterality Date   COLON RESECTION  09/25/2023   colon cancer   COLONOSCOPY  09/2023   Pyrtle-MAC-suprep (poor)-1 yr recall   PORTACATH PLACEMENT Right 10/17/2023   Procedure: INSERTION PORT-A-CATH WITH MATILDA GUIDANCE;  Surgeon: Sheldon Standing, MD;  Location: [facility];  Service: General;  Laterality: Right;   PROCTOSCOPY N/A 09/25/2023   Procedure: RIGID PROCTOSCOPY;  Surgeon: Sheldon Standing, MD;  Location: [facility];  Service: General;  Laterality: N/A;    I have reviewed the social history and family history with the patient and they are unchanged from previous note.  ALLERGIES:  is allergic to ambien  [zolpidem ].  MEDICATIONS:  Current Outpatient Medications  Medication Sig Dispense Refill   Acetaminophen  (TYLENOL  PO) Take 500 mg by mouth every 6 (six) hours.     buPROPion  (WELLBUTRIN  XL) 150 MG 24 hr tablet Take 1 tablet (150 mg total) by mouth every morning. 30 tablet 2   DULoxetine  (CYMBALTA ) 60 MG capsule Take 1 capsule (60 mg total) by mouth 2 (two) times daily. 60 capsule 2   eszopiclone  (LUNESTA ) 2 MG TABS tablet Take 1 tablet (2 mg total) by mouth at bedtime as needed for sleep. Take immediately before bedtime 30 tablet 1   gabapentin  (NEURONTIN ) 100 MG capsule Take 2 capsules (200 mg total) by mouth 3 (three) times daily. May increase to 3 capsules three time a day gradually as tolerates 200 capsule 1   lidocaine -prilocaine  (EMLA ) cream Apply to affected area once 30 g 3   ondansetron  (ZOFRAN ) 8 MG tablet Take 1 tablet (8 mg  total) by mouth every 8 (eight) hours as needed for nausea or vomiting. 30 tablet 1   prochlorperazine  (COMPAZINE ) 10 MG tablet Take 1 tablet (10 mg total) by mouth every 6 (six) hours as needed for nausea or vomiting. 30 tablet 1   propranolol  (INDERAL ) 10 MG tablet TAKE 1 TABLET(10 MG) BY MOUTH TWICE DAILY AS NEEDED (Patient taking  differently: Take 10 mg by mouth daily as needed.) 60 tablet 2   Vitamin D , Ergocalciferol , (DRISDOL ) 1.25 MG (50000 UNIT) CAPS capsule Take 1 capsule (50,000 Units total) by mouth every 7 (seven) days. 12 capsule 3   No current facility-administered medications for this visit.    PHYSICAL EXAMINATION: ECOG PERFORMANCE STATUS: 1 - Symptomatic but completely ambulatory  Vitals:   12/03/24 1104 12/03/24 1105  BP: (!) 142/97 (!) 137/99  Pulse: 94 91  Resp: 18   Temp: 98.1 F (36.7 C)   SpO2: 97% 98%   Wt Readings from Last 3 Encounters:  12/03/24 (!) 310 lb 14.4 oz (141 kg)  11/25/24 (!) 306 lb (138.8 kg)  11/24/24 (!) 306 lb (138.8 kg)     GENERAL:alert, no distress and comfortable SKIN: skin color, texture, turgor are normal, no rashes or significant lesions EYES: normal, Conjunctiva are pink and non-injected, sclera clear NECK: supple, thyroid  normal size, non-tender, without nodularity LYMPH:  no palpable lymphadenopathy in the cervical, axillary  LUNGS: clear to auscultation and percussion with normal breathing effort HEART: regular rate & rhythm and no murmurs and no lower extremity edema ABDOMEN:abdomen soft, non-tender and normal bowel sounds Musculoskeletal:no cyanosis of digits and no clubbing    Physical Exam    LABORATORY DATA:  I have reviewed the data as listed    Latest Ref Rng & Units 12/03/2024   10:35 AM 09/11/2024    9:46 AM 06/10/2024   12:56 PM  CBC  WBC 4.0 - 10.5 K/uL 6.1  6.6  5.9   Hemoglobin 13.0 - 17.0 g/dL 85.8  84.6  86.6   Hematocrit 39.0 - 52.0 % 39.0  42.7  38.3   Platelets 150 - 400 K/uL 245  254  254         Latest Ref Rng & Units 12/03/2024   10:35 AM 09/11/2024    9:46 AM 06/10/2024   12:56 PM  CMP  Glucose 70 - 99 mg/dL 894  868  877   BUN 6 - 20 mg/dL 9  15  13    Creatinine 0.61 - 1.24 mg/dL 9.18  9.16  9.19   Sodium 135 - 145 mmol/L 136  136  137   Potassium 3.5 - 5.1 mmol/L 4.0  3.8  3.8   Chloride 98 - 111 mmol/L 101   104  104   CO2 22 - 32 mmol/L 26  25  26    Calcium  8.9 - 10.3 mg/dL 9.2  9.3  9.5   Total Protein 6.5 - 8.1 g/dL 7.5  7.8  7.5   Total Bilirubin 0.0 - 1.2 mg/dL 0.4  0.9  0.5   Alkaline Phos 38 - 126 U/L 69  62  64   AST 15 - 41 U/L 30  29  36   ALT 0 - 44 U/L 39  37  38       RADIOGRAPHIC STUDIES: I have personally reviewed the radiological images as listed and agreed with the findings in the report. No results found.    Orders Placed This Encounter  Procedures   CT CHEST ABDOMEN PELVIS W  CONTRAST    Standing Status:   Future    Expected Date:   03/26/2025    Expiration Date:   12/03/2025    If indicated for the ordered procedure, I authorize the administration of contrast media per Radiology protocol:   Yes    Does the patient have a contrast media/X-Otto dye allergy?:   No    Preferred imaging location?:   [facility]    Release to patient:   Immediate    If indicated for the ordered procedure, I authorize the administration of oral contrast media per Radiology protocol:   Yes   Miscellaneous test (send-out)   All questions were answered. The patient knows to call the clinic with any problems, questions or concerns. No barriers to learning was detected. The total time spent in the appointment was 25 minutes, including review of chart and various tests results, discussions about plan of care and coordination of care plan     Onita Mattock, MD 12/03/2024

## 2024-12-16 LAB — SIGNATERA
SIGNATERA MTM READOUT: 0 MTM/ml
SIGNATERA TEST RESULT: NEGATIVE

## 2024-12-25 ENCOUNTER — Other Ambulatory Visit: Payer: Self-pay

## 2024-12-25 DIAGNOSIS — C187 Malignant neoplasm of sigmoid colon: Secondary | ICD-10-CM

## 2024-12-25 NOTE — Progress Notes (Signed)
 As per Dr. Lanny, order was placed in portal successfully for Signatera, paperwork was uploaded and kit taken to lab to be drawn 03/11. Patient was called and make aware of the upcoming appointment.

## 2024-12-30 ENCOUNTER — Encounter (HOSPITAL_COMMUNITY): Payer: Self-pay | Admitting: Clinical

## 2024-12-30 ENCOUNTER — Telehealth (HOSPITAL_COMMUNITY): Payer: Self-pay | Admitting: Clinical

## 2025-01-06 ENCOUNTER — Encounter: Payer: Self-pay | Admitting: Hematology

## 2025-01-13 ENCOUNTER — Ambulatory Visit (HOSPITAL_COMMUNITY): Admitting: Clinical

## 2025-01-14 ENCOUNTER — Other Ambulatory Visit: Payer: Self-pay

## 2025-01-27 ENCOUNTER — Telehealth

## 2025-01-27 ENCOUNTER — Ambulatory Visit (HOSPITAL_COMMUNITY): Admitting: Clinical

## 2025-01-27 DIAGNOSIS — H60391 Other infective otitis externa, right ear: Secondary | ICD-10-CM

## 2025-01-27 MED ORDER — CIPROFLOXACIN-DEXAMETHASONE 0.3-0.1 % OT SUSP: 4.0000 [drp] | Freq: Two times a day (BID) | OTIC | 0 refills | Status: AC

## 2025-01-27 NOTE — Progress Notes (Signed)
 E Visit for Otitis Externa (Swimmer's Ear)  We are sorry that you are not feeling well. Here is how we plan to help!  Based on what you shared with me it looks like you have Otitis Externa, an infection of the ear canal/outer ear that is often called Swimmers Ear.  Otitis Externa is a redness or swelling, irritation, or infection of your outer ear canal. There can be multiple causes but often these symptoms occur within a few days of swimming. This is a painful condition that often happens to children and swimmers of all ages.  It is not contagious and oral antibiotics are not required to treat uncomplicated infection.  The usual symptoms include:  Itchiness inside the ear  Redness or a sense of swelling in the ear  Pain when the ear is tugged on when pressure is placed on the ear  Pus draining form the infected ear   In certain cases, swimmers ear may progress to a more serious bacterial infection of the middle or inner ear. If you have a fever 102 and up and significantly worsening symptoms, this could indicate a more serious infection moving to the middle/inner ear and needs a face to face evaluation in an office by a provider.   Based on your presentation: I have prescribed: Ciprofloxacin  0.3% and dexamethasone  0.1% otic suspension four drops in affected ears two times a day for 7 days  Your symptoms should improve over the next 3 days and should resolve in about 7 days.  HOME CARE:  Wash your hands frequently. Do not place the tip of the bottle on your ear or touch it with your fingers. You can take Acetominophen 650 mg every 4-6 hours as needed for pain.  If pain is severe or moderate, you can apply a heating pad (set on low) or hot water  bottle (wrapped in a towel) to outer ear for 20 minutes.  This will also increase drainage. Avoid ear plugs. Do not use Q-tips. After showers, help the water  run out by tilting your head to one side.  GET HELP RIGHT AWAY IF:  Fever is over  102.2 degrees. You develop progressive ear pain or hearing loss. Ear symptoms persist longer than 3 days after treatment.  MAKE SURE YOU:  Understand these instructions. Will watch your condition. Will get help right away if you are not doing well or get worse.  TO PREVENT SWIMMER'S EAR: Use a bathing cap or custom fitted swim molds to keep your ears dry. Towel off after swimming to dry your ears. Tilt your head or pull your earlobes to allow the water  to escape your ear canal. If there is still water  in your ears, consider using a hairdryer on the lowest setting.  Thank you for choosing an e-visit. Your e-visit answers were reviewed by a board certified advanced clinical practitioner to complete your personal care plan. Depending upon the condition, your plan could have included both over the counter or prescription medications. Please review your pharmacy choice. Be sure that the pharmacy you have chosen is open so that you can pick up your prescription now.  If there is a problem you may message your provider in MyChart to have the prescription routed to another pharmacy. Your safety is important to us . If you have drug allergies check your prescription carefully.  For the next 24 hours, you can use MyChart to ask questions about todays visit, request a non-urgent call back, or ask for a work or school excuse from  your e-visit provider. You will get an email in the next two days asking about your experience. I hope that your e-visit has been valuable and will speed your recovery.   I have spent 5 minutes in review of e-visit questionnaire, review and updating patient chart, medical decision making and response to patient.   Delon CHRISTELLA Dickinson, PA-C

## 2025-02-03 ENCOUNTER — Inpatient Hospital Stay: Attending: Nurse Practitioner

## 2025-02-04 ENCOUNTER — Telehealth (HOSPITAL_COMMUNITY): Admitting: Psychiatry

## 2025-02-24 ENCOUNTER — Ambulatory Visit (HOSPITAL_COMMUNITY): Admitting: Clinical

## 2025-03-03 ENCOUNTER — Inpatient Hospital Stay: Attending: Nurse Practitioner

## 2025-03-10 ENCOUNTER — Ambulatory Visit: Admitting: Nurse Practitioner

## 2025-03-24 ENCOUNTER — Ambulatory Visit (HOSPITAL_COMMUNITY): Admitting: Clinical

## 2025-04-01 ENCOUNTER — Inpatient Hospital Stay: Admitting: Hematology

## 2025-04-01 ENCOUNTER — Inpatient Hospital Stay: Attending: Nurse Practitioner

## 2025-04-28 ENCOUNTER — Ambulatory Visit (HOSPITAL_COMMUNITY): Admitting: Clinical
